# Patient Record
Sex: Male | Born: 1955 | Race: White | Hispanic: No | Marital: Married | Smoking: Never smoker
Health system: Southern US, Community
[De-identification: ages and names within clinical notes are randomized; demographics above are authoritative.]

## PROBLEM LIST (undated history)

## (undated) DIAGNOSIS — E039 Hypothyroidism, unspecified: Secondary | ICD-10-CM | POA: Insufficient documentation

## (undated) DIAGNOSIS — I2699 Other pulmonary embolism without acute cor pulmonale: Secondary | ICD-10-CM

## (undated) DIAGNOSIS — Z86718 Personal history of other venous thrombosis and embolism: Secondary | ICD-10-CM | POA: Insufficient documentation

## (undated) DIAGNOSIS — IMO0001 Reserved for inherently not codable concepts without codable children: Secondary | ICD-10-CM

## (undated) DIAGNOSIS — M1990 Unspecified osteoarthritis, unspecified site: Secondary | ICD-10-CM

## (undated) DIAGNOSIS — M2540 Effusion, unspecified joint: Secondary | ICD-10-CM

## (undated) DIAGNOSIS — C801 Malignant (primary) neoplasm, unspecified: Secondary | ICD-10-CM

## (undated) DIAGNOSIS — M272 Inflammatory conditions of jaws: Secondary | ICD-10-CM

## (undated) DIAGNOSIS — Z8619 Personal history of other infectious and parasitic diseases: Secondary | ICD-10-CM

## (undated) DIAGNOSIS — M7542 Impingement syndrome of left shoulder: Secondary | ICD-10-CM | POA: Insufficient documentation

## (undated) DIAGNOSIS — E119 Type 2 diabetes mellitus without complications: Secondary | ICD-10-CM | POA: Insufficient documentation

## (undated) DIAGNOSIS — Z79899 Other long term (current) drug therapy: Secondary | ICD-10-CM | POA: Insufficient documentation

## (undated) DIAGNOSIS — S43432A Superior glenoid labrum lesion of left shoulder, initial encounter: Secondary | ICD-10-CM | POA: Insufficient documentation

## (undated) DIAGNOSIS — Z923 Personal history of irradiation: Secondary | ICD-10-CM

## (undated) DIAGNOSIS — E785 Hyperlipidemia, unspecified: Secondary | ICD-10-CM | POA: Insufficient documentation

## (undated) DIAGNOSIS — J189 Pneumonia, unspecified organism: Secondary | ICD-10-CM

## (undated) DIAGNOSIS — M2550 Pain in unspecified joint: Secondary | ICD-10-CM

## (undated) DIAGNOSIS — M75102 Unspecified rotator cuff tear or rupture of left shoulder, not specified as traumatic: Secondary | ICD-10-CM | POA: Insufficient documentation

## (undated) DIAGNOSIS — Y842 Radiological procedure and radiotherapy as the cause of abnormal reaction of the patient, or of later complication, without mention of misadventure at the time of the procedure: Secondary | ICD-10-CM

## (undated) DIAGNOSIS — N2889 Other specified disorders of kidney and ureter: Secondary | ICD-10-CM

## (undated) DIAGNOSIS — G629 Polyneuropathy, unspecified: Secondary | ICD-10-CM | POA: Insufficient documentation

## (undated) DIAGNOSIS — M19012 Primary osteoarthritis, left shoulder: Secondary | ICD-10-CM | POA: Insufficient documentation

## (undated) DIAGNOSIS — Z9221 Personal history of antineoplastic chemotherapy: Secondary | ICD-10-CM

## (undated) DIAGNOSIS — M719 Bursopathy, unspecified: Secondary | ICD-10-CM

## (undated) DIAGNOSIS — Z833 Family history of diabetes mellitus: Secondary | ICD-10-CM | POA: Insufficient documentation

## (undated) DIAGNOSIS — I82409 Acute embolism and thrombosis of unspecified deep veins of unspecified lower extremity: Secondary | ICD-10-CM

## (undated) DIAGNOSIS — I10 Essential (primary) hypertension: Secondary | ICD-10-CM

## (undated) HISTORY — DX: Pneumonia, unspecified organism: J18.9

## (undated) HISTORY — PX: MOUTH SURGERY: SHX715

## (undated) HISTORY — PX: JOINT REPLACEMENT: SHX530

## (undated) HISTORY — PX: OTHER SURGICAL HISTORY: SHX169

## (undated) HISTORY — DX: Personal history of irradiation: Z92.3

## (undated) HISTORY — DX: Type 2 diabetes mellitus without complications: E11.9

## (undated) HISTORY — DX: Acute embolism and thrombosis of unspecified deep veins of unspecified lower extremity: I82.409

## (undated) HISTORY — DX: Personal history of antineoplastic chemotherapy: Z92.21

## (undated) HISTORY — DX: Inflammatory conditions of jaws: M27.2

## (undated) HISTORY — DX: Radiological procedure and radiotherapy as the cause of abnormal reaction of the patient, or of later complication, without mention of misadventure at the time of the procedure: Y84.2

## (undated) HISTORY — PX: CARDIAC CATHETERIZATION: SHX172

---
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
## 1991-09-05 HISTORY — PX: KNEE SURGERY: SHX244

## 2001-09-03 ENCOUNTER — Encounter: Admission: RE | Admit: 2001-09-03 | Discharge: 2001-12-02 | Payer: Self-pay | Admitting: Obstetrics and Gynecology

## 2002-04-05 ENCOUNTER — Inpatient Hospital Stay (HOSPITAL_COMMUNITY): Admission: EM | Admit: 2002-04-05 | Discharge: 2002-04-06 | Payer: Self-pay

## 2002-09-04 HISTORY — PX: OTHER SURGICAL HISTORY: SHX169

## 2002-10-25 ENCOUNTER — Inpatient Hospital Stay (HOSPITAL_COMMUNITY): Admission: EM | Admit: 2002-10-25 | Discharge: 2002-10-28 | Payer: Self-pay | Admitting: *Deleted

## 2002-10-25 ENCOUNTER — Encounter: Payer: Self-pay | Admitting: Emergency Medicine

## 2002-10-25 ENCOUNTER — Encounter: Payer: Self-pay | Admitting: Orthopedic Surgery

## 2005-02-27 ENCOUNTER — Ambulatory Visit (HOSPITAL_COMMUNITY): Admission: RE | Admit: 2005-02-27 | Discharge: 2005-02-27 | Payer: Self-pay | Admitting: Gastroenterology

## 2007-09-05 DIAGNOSIS — I82409 Acute embolism and thrombosis of unspecified deep veins of unspecified lower extremity: Secondary | ICD-10-CM

## 2007-09-05 HISTORY — DX: Acute embolism and thrombosis of unspecified deep veins of unspecified lower extremity: I82.409

## 2007-10-06 DIAGNOSIS — C801 Malignant (primary) neoplasm, unspecified: Secondary | ICD-10-CM

## 2007-10-06 HISTORY — DX: Malignant (primary) neoplasm, unspecified: C80.1

## 2007-10-18 ENCOUNTER — Ambulatory Visit (HOSPITAL_BASED_OUTPATIENT_CLINIC_OR_DEPARTMENT_OTHER): Admission: RE | Admit: 2007-10-18 | Discharge: 2007-10-18 | Payer: Self-pay | Admitting: Otolaryngology

## 2007-10-18 ENCOUNTER — Encounter (INDEPENDENT_AMBULATORY_CARE_PROVIDER_SITE_OTHER): Payer: Self-pay | Admitting: Otolaryngology

## 2007-10-25 ENCOUNTER — Ambulatory Visit: Admission: RE | Admit: 2007-10-25 | Discharge: 2007-11-19 | Payer: Self-pay | Admitting: Radiation Oncology

## 2007-10-30 ENCOUNTER — Ambulatory Visit: Payer: Self-pay | Admitting: Dentistry

## 2007-10-30 ENCOUNTER — Ambulatory Visit: Payer: Self-pay | Admitting: Oncology

## 2007-10-30 ENCOUNTER — Encounter: Admission: AD | Admit: 2007-10-30 | Discharge: 2007-10-30 | Payer: Self-pay | Admitting: Dentistry

## 2007-10-31 LAB — COMPREHENSIVE METABOLIC PANEL
AST: 26 U/L (ref 0–37)
Alkaline Phosphatase: 100 U/L (ref 39–117)
BUN: 16 mg/dL (ref 6–23)
Creatinine, Ser: 0.9 mg/dL (ref 0.40–1.50)
Total Bilirubin: 0.7 mg/dL (ref 0.3–1.2)

## 2007-10-31 LAB — CBC WITH DIFFERENTIAL/PLATELET
BASO%: 0.1 % (ref 0.0–2.0)
Basophils Absolute: 0 10*3/uL (ref 0.0–0.1)
EOS%: 0.7 % (ref 0.0–7.0)
HCT: 43.4 % (ref 38.7–49.9)
HGB: 14.8 g/dL (ref 13.0–17.1)
MCH: 28.8 pg (ref 28.0–33.4)
MCHC: 34 g/dL (ref 32.0–35.9)
MCV: 84.6 fL (ref 81.6–98.0)
MONO%: 6.4 % (ref 0.0–13.0)
NEUT%: 73.4 % (ref 40.0–75.0)

## 2007-11-04 ENCOUNTER — Ambulatory Visit: Payer: Self-pay | Admitting: Dentistry

## 2007-11-06 ENCOUNTER — Ambulatory Visit (HOSPITAL_COMMUNITY): Admission: RE | Admit: 2007-11-06 | Discharge: 2007-11-06 | Payer: Self-pay | Admitting: Oncology

## 2007-11-12 ENCOUNTER — Ambulatory Visit (HOSPITAL_COMMUNITY): Admission: RE | Admit: 2007-11-12 | Discharge: 2007-11-12 | Payer: Self-pay | Admitting: Oncology

## 2007-11-21 LAB — CBC WITH DIFFERENTIAL/PLATELET
Basophils Absolute: 0 10*3/uL (ref 0.0–0.1)
EOS%: 0.9 % (ref 0.0–7.0)
HCT: 39.2 % (ref 38.7–49.9)
HGB: 13.4 g/dL (ref 13.0–17.1)
LYMPH%: 23 % (ref 14.0–48.0)
MCH: 28.5 pg (ref 28.0–33.4)
MCHC: 34.2 g/dL (ref 32.0–35.9)
MONO#: 0.7 10*3/uL (ref 0.1–0.9)
NEUT%: 63.4 % (ref 40.0–75.0)
Platelets: 219 10*3/uL (ref 145–400)
lymph#: 1.3 10*3/uL (ref 0.9–3.3)

## 2007-11-26 ENCOUNTER — Encounter: Admission: RE | Admit: 2007-11-26 | Discharge: 2007-11-26 | Payer: Self-pay | Admitting: Interventional Radiology

## 2007-12-11 ENCOUNTER — Ambulatory Visit: Payer: Self-pay | Admitting: Oncology

## 2007-12-11 LAB — CBC WITH DIFFERENTIAL/PLATELET
BASO%: 0.7 % (ref 0.0–2.0)
Basophils Absolute: 0 10*3/uL (ref 0.0–0.1)
EOS%: 1.2 % (ref 0.0–7.0)
Eosinophils Absolute: 0.1 10*3/uL (ref 0.0–0.5)
HCT: 38.4 % — ABNORMAL LOW (ref 38.7–49.9)
HGB: 13.4 g/dL (ref 13.0–17.1)
LYMPH%: 21.7 % (ref 14.0–48.0)
MCH: 29.5 pg (ref 28.0–33.4)
MCHC: 34.8 g/dL (ref 32.0–35.9)
MCV: 84.7 fL (ref 81.6–98.0)
MONO#: 0.6 10*3/uL (ref 0.1–0.9)
MONO%: 12.5 % (ref 0.0–13.0)
NEUT#: 3.3 10*3/uL (ref 1.5–6.5)
NEUT%: 63.9 % (ref 40.0–75.0)
Platelets: 165 10*3/uL (ref 145–400)
RBC: 4.54 10*6/uL (ref 4.20–5.71)
RDW: 14.1 % (ref 11.2–14.6)
WBC: 5.2 10*3/uL (ref 4.0–10.0)
lymph#: 1.1 10*3/uL (ref 0.9–3.3)

## 2007-12-18 LAB — CBC WITH DIFFERENTIAL/PLATELET
BASO%: 0.1 % (ref 0.0–2.0)
Eosinophils Absolute: 0 10*3/uL (ref 0.0–0.5)
LYMPH%: 18.9 % (ref 14.0–48.0)
MCHC: 34.3 g/dL (ref 32.0–35.9)
MCV: 86.6 fL (ref 81.6–98.0)
MONO%: 6 % (ref 0.0–13.0)
NEUT#: 5.7 10*3/uL (ref 1.5–6.5)
Platelets: 129 10*3/uL — ABNORMAL LOW (ref 145–400)
RBC: 4.09 10*6/uL — ABNORMAL LOW (ref 4.20–5.71)
RDW: 14.9 % — ABNORMAL HIGH (ref 11.2–14.6)
WBC: 7.6 10*3/uL (ref 4.0–10.0)

## 2007-12-23 ENCOUNTER — Ambulatory Visit: Admission: RE | Admit: 2007-12-23 | Discharge: 2008-03-22 | Payer: Self-pay | Admitting: Radiation Oncology

## 2007-12-24 LAB — CBC WITH DIFFERENTIAL/PLATELET
BASO%: 1.4 % (ref 0.0–2.0)
Eosinophils Absolute: 0.1 10*3/uL (ref 0.0–0.5)
MCHC: 34.3 g/dL (ref 32.0–35.9)
MONO#: 0.5 10*3/uL (ref 0.1–0.9)
NEUT#: 3.8 10*3/uL (ref 1.5–6.5)
Platelets: 188 10*3/uL (ref 145–400)
RBC: 4.15 10*6/uL — ABNORMAL LOW (ref 4.20–5.71)
RDW: 16.4 % — ABNORMAL HIGH (ref 11.2–14.6)
WBC: 5.7 10*3/uL (ref 4.0–10.0)
lymph#: 1.3 10*3/uL (ref 0.9–3.3)

## 2008-01-01 LAB — CBC WITH DIFFERENTIAL/PLATELET
BASO%: 0.4 % (ref 0.0–2.0)
Eosinophils Absolute: 0.1 10*3/uL (ref 0.0–0.5)
HCT: 35.6 % — ABNORMAL LOW (ref 38.7–49.9)
HGB: 12.4 g/dL — ABNORMAL LOW (ref 13.0–17.1)
LYMPH%: 32.2 % (ref 14.0–48.0)
MCHC: 34.7 g/dL (ref 32.0–35.9)
MONO#: 0.6 10*3/uL (ref 0.1–0.9)
NEUT#: 1.7 10*3/uL (ref 1.5–6.5)
NEUT%: 48.1 % (ref 40.0–75.0)
Platelets: 134 10*3/uL — ABNORMAL LOW (ref 145–400)
WBC: 3.6 10*3/uL — ABNORMAL LOW (ref 4.0–10.0)
lymph#: 1.2 10*3/uL (ref 0.9–3.3)

## 2008-01-08 ENCOUNTER — Ambulatory Visit (HOSPITAL_COMMUNITY): Admission: RE | Admit: 2008-01-08 | Discharge: 2008-01-08 | Payer: Self-pay | Admitting: Oncology

## 2008-01-08 LAB — CBC WITH DIFFERENTIAL/PLATELET
Eosinophils Absolute: 0 10*3/uL (ref 0.0–0.5)
HCT: 31.7 % — ABNORMAL LOW (ref 38.7–49.9)
LYMPH%: 19.1 % (ref 14.0–48.0)
MONO#: 0.4 10*3/uL (ref 0.1–0.9)
NEUT#: 4.9 10*3/uL (ref 1.5–6.5)
NEUT%: 75.2 % — ABNORMAL HIGH (ref 40.0–75.0)
Platelets: 131 10*3/uL — ABNORMAL LOW (ref 145–400)
WBC: 6.5 10*3/uL (ref 4.0–10.0)

## 2008-01-08 LAB — COMPREHENSIVE METABOLIC PANEL
BUN: 15 mg/dL (ref 6–23)
CO2: 25 mEq/L (ref 19–32)
Creatinine, Ser: 0.97 mg/dL (ref 0.40–1.50)
Glucose, Bld: 152 mg/dL — ABNORMAL HIGH (ref 70–99)
Total Bilirubin: 0.3 mg/dL (ref 0.3–1.2)

## 2008-01-08 LAB — LACTATE DEHYDROGENASE: LDH: 184 U/L (ref 94–250)

## 2008-01-14 LAB — COMPREHENSIVE METABOLIC PANEL
ALT: 15 U/L (ref 0–53)
AST: 13 U/L (ref 0–37)
Albumin: 4.1 g/dL (ref 3.5–5.2)
Alkaline Phosphatase: 72 U/L (ref 39–117)
BUN: 21 mg/dL (ref 6–23)
CO2: 25 mEq/L (ref 19–32)
Calcium: 8.9 mg/dL (ref 8.4–10.5)
Chloride: 106 mEq/L (ref 96–112)
Creatinine, Ser: 1.06 mg/dL (ref 0.40–1.50)
Glucose, Bld: 131 mg/dL — ABNORMAL HIGH (ref 70–99)
Potassium: 4.9 mEq/L (ref 3.5–5.3)
Sodium: 141 mEq/L (ref 135–145)
Total Bilirubin: 0.5 mg/dL (ref 0.3–1.2)
Total Protein: 6.3 g/dL (ref 6.0–8.3)

## 2008-01-14 LAB — LACTATE DEHYDROGENASE: LDH: 202 U/L (ref 94–250)

## 2008-01-14 LAB — CBC WITH DIFFERENTIAL/PLATELET
BASO%: 0.4 % (ref 0.0–2.0)
Basophils Absolute: 0 10*3/uL (ref 0.0–0.1)
EOS%: 0.4 % (ref 0.0–7.0)
Eosinophils Absolute: 0 10*3/uL (ref 0.0–0.5)
HCT: 32.8 % — ABNORMAL LOW (ref 38.7–49.9)
HGB: 11.2 g/dL — ABNORMAL LOW (ref 13.0–17.1)
LYMPH%: 20.8 % (ref 14.0–48.0)
MCH: 30.4 pg (ref 28.0–33.4)
MCHC: 34.2 g/dL (ref 32.0–35.9)
MCV: 88.8 fL (ref 81.6–98.0)
MONO#: 0.6 10*3/uL (ref 0.1–0.9)
MONO%: 10.8 % (ref 0.0–13.0)
NEUT#: 3.6 10*3/uL (ref 1.5–6.5)
NEUT%: 67.6 % (ref 40.0–75.0)
Platelets: 165 10*3/uL (ref 145–400)
RBC: 3.69 10*6/uL — ABNORMAL LOW (ref 4.20–5.71)
RDW: 18.8 % — ABNORMAL HIGH (ref 11.2–14.6)
WBC: 5.3 10*3/uL (ref 4.0–10.0)
lymph#: 1.1 10*3/uL (ref 0.9–3.3)

## 2008-01-28 ENCOUNTER — Ambulatory Visit: Payer: Self-pay | Admitting: Oncology

## 2008-01-30 LAB — COMPREHENSIVE METABOLIC PANEL
ALT: 16 U/L (ref 0–53)
AST: 17 U/L (ref 0–37)
Albumin: 4.2 g/dL (ref 3.5–5.2)
Alkaline Phosphatase: 105 U/L (ref 39–117)
Calcium: 9.3 mg/dL (ref 8.4–10.5)
Chloride: 101 mEq/L (ref 96–112)
Potassium: 4.9 mEq/L (ref 3.5–5.3)
Sodium: 140 mEq/L (ref 135–145)

## 2008-01-30 LAB — CBC WITH DIFFERENTIAL/PLATELET
BASO%: 0.3 % (ref 0.0–2.0)
EOS%: 0.2 % (ref 0.0–7.0)
HGB: 10.9 g/dL — ABNORMAL LOW (ref 13.0–17.1)
MCH: 30.9 pg (ref 28.0–33.4)
MCHC: 34.1 g/dL (ref 32.0–35.9)
RBC: 3.52 10*6/uL — ABNORMAL LOW (ref 4.20–5.71)
RDW: 18.1 % — ABNORMAL HIGH (ref 11.2–14.6)
lymph#: 1 10*3/uL (ref 0.9–3.3)

## 2008-02-04 LAB — CBC WITH DIFFERENTIAL/PLATELET
BASO%: 0.9 % (ref 0.0–2.0)
EOS%: 0.7 % (ref 0.0–7.0)
MCH: 31.3 pg (ref 28.0–33.4)
MCHC: 34.7 g/dL (ref 32.0–35.9)
MONO#: 0.7 10*3/uL (ref 0.1–0.9)
NEUT%: 71.5 % (ref 40.0–75.0)
RBC: 3.64 10*6/uL — ABNORMAL LOW (ref 4.20–5.71)
RDW: 15.7 % — ABNORMAL HIGH (ref 11.2–14.6)
WBC: 6.1 10*3/uL (ref 4.0–10.0)
lymph#: 1 10*3/uL (ref 0.9–3.3)

## 2008-02-11 LAB — CBC WITH DIFFERENTIAL/PLATELET
Eosinophils Absolute: 0.1 10*3/uL (ref 0.0–0.5)
MCV: 89.7 fL (ref 81.6–98.0)
MONO%: 9.3 % (ref 0.0–13.0)
NEUT#: 6 10*3/uL (ref 1.5–6.5)
RBC: 3.81 10*6/uL — ABNORMAL LOW (ref 4.20–5.71)
RDW: 14.5 % (ref 11.2–14.6)
WBC: 7.5 10*3/uL (ref 4.0–10.0)

## 2008-02-12 ENCOUNTER — Ambulatory Visit: Payer: Self-pay | Admitting: Dentistry

## 2008-02-18 LAB — COMPREHENSIVE METABOLIC PANEL
AST: 14 U/L (ref 0–37)
Albumin: 4.6 g/dL (ref 3.5–5.2)
Alkaline Phosphatase: 109 U/L (ref 39–117)
Glucose, Bld: 335 mg/dL — ABNORMAL HIGH (ref 70–99)
Potassium: 4.7 mEq/L (ref 3.5–5.3)
Sodium: 132 mEq/L — ABNORMAL LOW (ref 135–145)
Total Protein: 7.5 g/dL (ref 6.0–8.3)

## 2008-02-18 LAB — CBC WITH DIFFERENTIAL/PLATELET
BASO%: 0.6 % (ref 0.0–2.0)
LYMPH%: 6.9 % — ABNORMAL LOW (ref 14.0–48.0)
MCH: 32.2 pg (ref 28.0–33.4)
MCHC: 36 g/dL — ABNORMAL HIGH (ref 32.0–35.9)
MCV: 89.3 fL (ref 81.6–98.0)
MONO%: 9 % (ref 0.0–13.0)
Platelets: 190 10*3/uL (ref 145–400)
RBC: 3.86 10*6/uL — ABNORMAL LOW (ref 4.20–5.71)

## 2008-02-24 LAB — CBC WITH DIFFERENTIAL/PLATELET
BASO%: 1 % (ref 0.0–2.0)
EOS%: 0.3 % (ref 0.0–7.0)
LYMPH%: 5.3 % — ABNORMAL LOW (ref 14.0–48.0)
MCHC: 35.6 g/dL (ref 32.0–35.9)
MONO#: 0.6 10*3/uL (ref 0.1–0.9)
RBC: 3.81 10*6/uL — ABNORMAL LOW (ref 4.20–5.71)
WBC: 8.3 10*3/uL (ref 4.0–10.0)
lymph#: 0.4 10*3/uL — ABNORMAL LOW (ref 0.9–3.3)

## 2008-02-24 LAB — BASIC METABOLIC PANEL
CO2: 21 mEq/L (ref 19–32)
Chloride: 96 mEq/L (ref 96–112)
Glucose, Bld: 264 mg/dL — ABNORMAL HIGH (ref 70–99)
Potassium: 5.2 mEq/L (ref 3.5–5.3)
Sodium: 131 mEq/L — ABNORMAL LOW (ref 135–145)

## 2008-02-27 LAB — BASIC METABOLIC PANEL
BUN: 35 mg/dL — ABNORMAL HIGH (ref 6–23)
CO2: 24 mEq/L (ref 19–32)
Chloride: 102 mEq/L (ref 96–112)
Creatinine, Ser: 1.33 mg/dL (ref 0.40–1.50)
Glucose, Bld: 204 mg/dL — ABNORMAL HIGH (ref 70–99)

## 2008-02-28 ENCOUNTER — Ambulatory Visit (HOSPITAL_COMMUNITY): Admission: RE | Admit: 2008-02-28 | Discharge: 2008-02-28 | Payer: Self-pay | Admitting: Radiation Oncology

## 2008-03-03 LAB — CBC WITH DIFFERENTIAL/PLATELET
EOS%: 1.7 % (ref 0.0–7.0)
Eosinophils Absolute: 0.1 10*3/uL (ref 0.0–0.5)
LYMPH%: 8.1 % — ABNORMAL LOW (ref 14.0–48.0)
MCH: 32.9 pg (ref 28.0–33.4)
MCHC: 35.2 g/dL (ref 32.0–35.9)
MCV: 93.4 fL (ref 81.6–98.0)
MONO%: 13.8 % — ABNORMAL HIGH (ref 0.0–13.0)
NEUT#: 2.3 10*3/uL (ref 1.5–6.5)
Platelets: 109 10*3/uL — ABNORMAL LOW (ref 145–400)
RBC: 3.31 10*6/uL — ABNORMAL LOW (ref 4.20–5.71)

## 2008-03-03 LAB — COMPREHENSIVE METABOLIC PANEL
AST: 15 U/L (ref 0–37)
Alkaline Phosphatase: 96 U/L (ref 39–117)
Glucose, Bld: 269 mg/dL — ABNORMAL HIGH (ref 70–99)
Sodium: 134 mEq/L — ABNORMAL LOW (ref 135–145)
Total Bilirubin: 0.6 mg/dL (ref 0.3–1.2)
Total Protein: 6.8 g/dL (ref 6.0–8.3)

## 2008-03-10 ENCOUNTER — Ambulatory Visit: Payer: Self-pay | Admitting: Oncology

## 2008-03-10 LAB — CBC WITH DIFFERENTIAL/PLATELET
BASO%: 1.9 % (ref 0.0–2.0)
Basophils Absolute: 0 10*3/uL (ref 0.0–0.1)
EOS%: 1.1 % (ref 0.0–7.0)
HCT: 27.9 % — ABNORMAL LOW (ref 38.7–49.9)
HGB: 9.8 g/dL — ABNORMAL LOW (ref 13.0–17.1)
LYMPH%: 14.8 % (ref 14.0–48.0)
MCH: 32.4 pg (ref 28.0–33.4)
MCHC: 35.1 g/dL (ref 32.0–35.9)
MCV: 92.2 fL (ref 81.6–98.0)
MONO%: 17.2 % — ABNORMAL HIGH (ref 0.0–13.0)
NEUT%: 65 % (ref 40.0–75.0)
Platelets: 198 10*3/uL (ref 145–400)
lymph#: 0.3 10*3/uL — ABNORMAL LOW (ref 0.9–3.3)

## 2008-03-16 LAB — CBC WITH DIFFERENTIAL/PLATELET
BASO%: 0 % (ref 0.0–2.0)
Basophils Absolute: 0 10*3/uL (ref 0.0–0.1)
EOS%: 0.8 % (ref 0.0–7.0)
HGB: 9.4 g/dL — ABNORMAL LOW (ref 13.0–17.1)
MCH: 32.9 pg (ref 28.0–33.4)
MCHC: 34.9 g/dL (ref 32.0–35.9)
MCV: 94.3 fL (ref 81.6–98.0)
MONO%: 22.7 % — ABNORMAL HIGH (ref 0.0–13.0)
RDW: 14.8 % — ABNORMAL HIGH (ref 11.2–14.6)

## 2008-03-16 LAB — TECHNOLOGIST REVIEW

## 2008-03-23 LAB — CBC WITH DIFFERENTIAL/PLATELET
BASO%: 1.4 % (ref 0.0–2.0)
Eosinophils Absolute: 0 10*3/uL (ref 0.0–0.5)
MCHC: 33.7 g/dL (ref 32.0–35.9)
MONO#: 0.9 10*3/uL (ref 0.1–0.9)
NEUT#: 4.3 10*3/uL (ref 1.5–6.5)
RBC: 3.09 10*6/uL — ABNORMAL LOW (ref 4.20–5.71)
RDW: 15.3 % — ABNORMAL HIGH (ref 11.2–14.6)
WBC: 5.7 10*3/uL (ref 4.0–10.0)

## 2008-03-27 LAB — COMPREHENSIVE METABOLIC PANEL
ALT: 26 U/L (ref 0–53)
Albumin: 4 g/dL (ref 3.5–5.2)
CO2: 28 mEq/L (ref 19–32)
Calcium: 9.4 mg/dL (ref 8.4–10.5)
Chloride: 96 mEq/L (ref 96–112)
Glucose, Bld: 312 mg/dL — ABNORMAL HIGH (ref 70–99)
Sodium: 137 mEq/L (ref 135–145)
Total Bilirubin: 0.6 mg/dL (ref 0.3–1.2)
Total Protein: 7 g/dL (ref 6.0–8.3)

## 2008-03-27 LAB — CBC WITH DIFFERENTIAL/PLATELET
BASO%: 1.4 % (ref 0.0–2.0)
Eosinophils Absolute: 0 10*3/uL (ref 0.0–0.5)
HCT: 30.3 % — ABNORMAL LOW (ref 38.7–49.9)
LYMPH%: 8.1 % — ABNORMAL LOW (ref 14.0–48.0)
MCHC: 33.6 g/dL (ref 32.0–35.9)
MONO#: 0.5 10*3/uL (ref 0.1–0.9)
NEUT#: 5 10*3/uL (ref 1.5–6.5)
NEUT%: 81.9 % — ABNORMAL HIGH (ref 40.0–75.0)
Platelets: 322 10*3/uL (ref 145–400)
RBC: 3.19 10*6/uL — ABNORMAL LOW (ref 4.20–5.71)
WBC: 6.1 10*3/uL (ref 4.0–10.0)
lymph#: 0.5 10*3/uL — ABNORMAL LOW (ref 0.9–3.3)

## 2008-03-27 LAB — LACTATE DEHYDROGENASE: LDH: 197 U/L (ref 94–250)

## 2008-04-14 ENCOUNTER — Ambulatory Visit: Payer: Self-pay | Admitting: Dentistry

## 2008-05-04 ENCOUNTER — Ambulatory Visit: Payer: Self-pay | Admitting: Oncology

## 2008-05-07 LAB — COMPREHENSIVE METABOLIC PANEL
ALT: 24 U/L (ref 0–53)
CO2: 24 mEq/L (ref 19–32)
Calcium: 9.5 mg/dL (ref 8.4–10.5)
Chloride: 103 mEq/L (ref 96–112)
Creatinine, Ser: 1.19 mg/dL (ref 0.40–1.50)
Glucose, Bld: 240 mg/dL — ABNORMAL HIGH (ref 70–99)
Total Bilirubin: 0.6 mg/dL (ref 0.3–1.2)

## 2008-05-07 LAB — CBC WITH DIFFERENTIAL/PLATELET
BASO%: 0.1 % (ref 0.0–2.0)
Basophils Absolute: 0 10*3/uL (ref 0.0–0.1)
Eosinophils Absolute: 0.1 10*3/uL (ref 0.0–0.5)
HCT: 34.7 % — ABNORMAL LOW (ref 38.7–49.9)
HGB: 11.9 g/dL — ABNORMAL LOW (ref 13.0–17.1)
LYMPH%: 10.9 % — ABNORMAL LOW (ref 14.0–48.0)
MONO#: 0.4 10*3/uL (ref 0.1–0.9)
NEUT#: 3.6 10*3/uL (ref 1.5–6.5)
NEUT%: 79.3 % — ABNORMAL HIGH (ref 40.0–75.0)
Platelets: 239 10*3/uL (ref 145–400)
WBC: 4.5 10*3/uL (ref 4.0–10.0)
lymph#: 0.5 10*3/uL — ABNORMAL LOW (ref 0.9–3.3)

## 2008-05-07 LAB — LACTATE DEHYDROGENASE: LDH: 191 U/L (ref 94–250)

## 2008-05-12 ENCOUNTER — Ambulatory Visit (HOSPITAL_COMMUNITY): Admission: RE | Admit: 2008-05-12 | Discharge: 2008-05-12 | Payer: Self-pay | Admitting: Oncology

## 2008-05-12 ENCOUNTER — Ambulatory Visit: Payer: Self-pay | Admitting: Surgery

## 2008-05-12 ENCOUNTER — Encounter (HOSPITAL_COMMUNITY): Payer: Self-pay | Admitting: Oncology

## 2008-05-13 LAB — CBC WITH DIFFERENTIAL/PLATELET
BASO%: 0.3 % (ref 0.0–2.0)
Basophils Absolute: 0 10*3/uL (ref 0.0–0.1)
EOS%: 1.5 % (ref 0.0–7.0)
HCT: 34.2 % — ABNORMAL LOW (ref 38.7–49.9)
HGB: 11.6 g/dL — ABNORMAL LOW (ref 13.0–17.1)
MCH: 31.3 pg (ref 28.0–33.4)
MCHC: 34 g/dL (ref 32.0–35.9)
MONO#: 0.7 10*3/uL (ref 0.1–0.9)
RDW: 13.5 % (ref 11.2–14.6)
WBC: 6.5 10*3/uL (ref 4.0–10.0)
lymph#: 0.6 10*3/uL — ABNORMAL LOW (ref 0.9–3.3)

## 2008-05-13 LAB — PROTHROMBIN TIME
INR: 1.1 (ref 0.0–1.5)
Prothrombin Time: 14.4 seconds (ref 11.6–15.2)

## 2008-05-16 ENCOUNTER — Ambulatory Visit: Payer: Self-pay | Admitting: Oncology

## 2008-05-16 ENCOUNTER — Ambulatory Visit: Payer: Self-pay | Admitting: Internal Medicine

## 2008-05-16 ENCOUNTER — Inpatient Hospital Stay (HOSPITAL_COMMUNITY): Admission: EM | Admit: 2008-05-16 | Discharge: 2008-05-19 | Payer: Self-pay | Admitting: Family Medicine

## 2008-05-18 LAB — HYPERCOAGULABLE PANEL, COMPREHENSIVE RET.
AntiThromb III Func: 111 % (ref 76–126)
Anticardiolipin IgG: <7 [GPL'U] (ref <11)
Beta-2 Glyco I IgG: <4 U/mL (ref <20)
Beta-2-Glycoprotein I IgA: <4 U/mL (ref <10)
DRVVT: 51.2 secs — ABNORMAL HIGH (ref 36.1–47.0)
Homocysteine: 12.4 umol/L (ref 4.0–15.4)
PTT Lupus Anticoagulant: 45.7 secs (ref 36.3–48.8)
Protein S Activity: 102 % (ref 69–129)
Protein S Ag, Total: 146 % — ABNORMAL HIGH (ref 70–140)

## 2008-05-20 LAB — PROTIME-INR: INR: 2.4 (ref 2.00–3.50)

## 2008-05-21 LAB — PROTIME-INR

## 2008-05-22 LAB — PROTIME-INR: Protime: 27.6 Seconds — ABNORMAL HIGH (ref 10.6–13.4)

## 2008-05-25 LAB — PROTIME-INR
INR: 1.5 — ABNORMAL LOW (ref 2.00–3.50)
Protime: 18 Seconds — ABNORMAL HIGH (ref 10.6–13.4)

## 2008-05-27 LAB — CBC WITH DIFFERENTIAL/PLATELET
BASO%: 0.8 % (ref 0.0–2.0)
Basophils Absolute: 0 10*3/uL (ref 0.0–0.1)
EOS%: 2.5 % (ref 0.0–7.0)
HGB: 10.9 g/dL — ABNORMAL LOW (ref 13.0–17.1)
MCH: 29.6 pg (ref 28.0–33.4)
MCV: 90.4 fL (ref 81.6–98.0)
MONO%: 9.3 % (ref 0.0–13.0)
RBC: 3.7 10*6/uL — ABNORMAL LOW (ref 4.20–5.71)
RDW: 13.2 % (ref 11.2–14.6)
lymph#: 0.6 10*3/uL — ABNORMAL LOW (ref 0.9–3.3)

## 2008-05-27 LAB — PROTIME-INR
INR: 1.7 — ABNORMAL LOW (ref 2.00–3.50)
Protime: 20.4 Seconds — ABNORMAL HIGH (ref 10.6–13.4)

## 2008-05-29 ENCOUNTER — Ambulatory Visit (HOSPITAL_COMMUNITY): Admission: RE | Admit: 2008-05-29 | Discharge: 2008-05-29 | Payer: Self-pay | Admitting: Oncology

## 2008-06-04 LAB — CBC WITH DIFFERENTIAL/PLATELET
BASO%: 0.2 % (ref 0.0–2.0)
Basophils Absolute: 0 10*3/uL (ref 0.0–0.1)
HCT: 33 % — ABNORMAL LOW (ref 38.7–49.9)
HGB: 11.3 g/dL — ABNORMAL LOW (ref 13.0–17.1)
LYMPH%: 14.8 % (ref 14.0–48.0)
MCH: 30.7 pg (ref 28.0–33.4)
MCHC: 34.3 g/dL (ref 32.0–35.9)
MONO#: 0.3 10*3/uL (ref 0.1–0.9)
NEUT%: 74.6 % (ref 40.0–75.0)
Platelets: 310 10*3/uL (ref 145–400)
WBC: 3.6 10*3/uL — ABNORMAL LOW (ref 4.0–10.0)
lymph#: 0.5 10*3/uL — ABNORMAL LOW (ref 0.9–3.3)

## 2008-06-04 LAB — PROTIME-INR

## 2008-06-05 LAB — CBC WITH DIFFERENTIAL/PLATELET
BASO%: 0.2 % (ref 0.0–2.0)
Basophils Absolute: 0 10*3/uL (ref 0.0–0.1)
Eosinophils Absolute: 0.1 10*3/uL (ref 0.0–0.5)
HCT: 33.4 % — ABNORMAL LOW (ref 38.7–49.9)
HGB: 11.4 g/dL — ABNORMAL LOW (ref 13.0–17.1)
LYMPH%: 13.7 % — ABNORMAL LOW (ref 14.0–48.0)
MCHC: 34 g/dL (ref 32.0–35.9)
MONO#: 0.3 10*3/uL (ref 0.1–0.9)
NEUT%: 74.9 % (ref 40.0–75.0)
Platelets: 263 10*3/uL (ref 145–400)
WBC: 3.7 10*3/uL — ABNORMAL LOW (ref 4.0–10.0)
lymph#: 0.5 10*3/uL — ABNORMAL LOW (ref 0.9–3.3)

## 2008-06-05 LAB — COMPREHENSIVE METABOLIC PANEL
ALT: 21 U/L (ref 0–53)
AST: 20 U/L (ref 0–37)
Albumin: 4.3 g/dL (ref 3.5–5.2)
Calcium: 9.5 mg/dL (ref 8.4–10.5)
Chloride: 107 mEq/L (ref 96–112)
Potassium: 3.9 mEq/L (ref 3.5–5.3)

## 2008-06-05 LAB — PROTIME-INR

## 2008-06-08 LAB — PROTIME-INR: INR: 2.7 (ref 2.00–3.50)

## 2008-06-10 LAB — PROTIME-INR: Protime: 31.2 Seconds — ABNORMAL HIGH (ref 10.6–13.4)

## 2008-06-12 LAB — PROTIME-INR

## 2008-06-15 LAB — PROTIME-INR
INR: 3.2 (ref 2.00–3.50)
Protime: 38.4 Seconds — ABNORMAL HIGH (ref 10.6–13.4)

## 2008-06-18 LAB — PROTIME-INR
INR: 2.9 (ref 2.00–3.50)
Protime: 34.8 Seconds — ABNORMAL HIGH (ref 10.6–13.4)

## 2008-06-22 ENCOUNTER — Ambulatory Visit: Payer: Self-pay | Admitting: Oncology

## 2008-06-22 LAB — PROTIME-INR: INR: 3.2 (ref 2.00–3.50)

## 2008-06-26 LAB — PROTIME-INR: INR: 2.2 (ref 2.00–3.50)

## 2008-07-01 LAB — PROTIME-INR: Protime: 15.6 Seconds — ABNORMAL HIGH (ref 10.6–13.4)

## 2008-07-07 LAB — COMPREHENSIVE METABOLIC PANEL
ALT: 14 U/L (ref 0–53)
CO2: 23 mEq/L (ref 19–32)
Calcium: 9.8 mg/dL (ref 8.4–10.5)
Chloride: 108 mEq/L (ref 96–112)
Potassium: 4.7 mEq/L (ref 3.5–5.3)
Sodium: 144 mEq/L (ref 135–145)
Total Bilirubin: 0.4 mg/dL (ref 0.3–1.2)
Total Protein: 7.3 g/dL (ref 6.0–8.3)

## 2008-07-07 LAB — CBC WITH DIFFERENTIAL/PLATELET
Basophils Absolute: 0 10*3/uL (ref 0.0–0.1)
HCT: 33.2 % — ABNORMAL LOW (ref 38.7–49.9)
HGB: 11.2 g/dL — ABNORMAL LOW (ref 13.0–17.1)
MONO#: 0.5 10*3/uL (ref 0.1–0.9)
NEUT%: 73.6 % (ref 40.0–75.0)
WBC: 4.2 10*3/uL (ref 4.0–10.0)
lymph#: 0.6 10*3/uL — ABNORMAL LOW (ref 0.9–3.3)

## 2008-07-07 LAB — LACTATE DEHYDROGENASE: LDH: 179 U/L (ref 94–250)

## 2008-07-07 LAB — PROTIME-INR

## 2008-07-14 LAB — PROTIME-INR
INR: 1.8 — ABNORMAL LOW (ref 2.00–3.50)
Protime: 21.6 Seconds — ABNORMAL HIGH (ref 10.6–13.4)

## 2008-07-21 LAB — PROTIME-INR: INR: 2.1 (ref 2.00–3.50)

## 2008-08-04 LAB — PROTIME-INR: INR: 2.3 (ref 2.00–3.50)

## 2008-08-07 ENCOUNTER — Ambulatory Visit: Payer: Self-pay | Admitting: Oncology

## 2008-08-11 ENCOUNTER — Ambulatory Visit (HOSPITAL_COMMUNITY): Admission: RE | Admit: 2008-08-11 | Discharge: 2008-08-11 | Payer: Self-pay | Admitting: Oncology

## 2008-08-20 LAB — CBC WITH DIFFERENTIAL/PLATELET
Basophils Absolute: 0 10*3/uL (ref 0.0–0.1)
EOS%: 1.8 % (ref 0.0–7.0)
Eosinophils Absolute: 0.1 10*3/uL (ref 0.0–0.5)
HGB: 11.3 g/dL — ABNORMAL LOW (ref 13.0–17.1)
MCH: 30 pg (ref 28.0–33.4)
MONO#: 0.4 10*3/uL (ref 0.1–0.9)
NEUT#: 2.7 10*3/uL (ref 1.5–6.5)
RDW: 14.5 % (ref 11.2–14.6)
WBC: 3.7 10*3/uL — ABNORMAL LOW (ref 4.0–10.0)
lymph#: 0.5 10*3/uL — ABNORMAL LOW (ref 0.9–3.3)

## 2008-08-20 LAB — PROTIME-INR: INR: 2.5 (ref 2.00–3.50)

## 2008-08-21 ENCOUNTER — Ambulatory Visit: Admission: RE | Admit: 2008-08-21 | Discharge: 2008-08-21 | Payer: Self-pay | Admitting: Oncology

## 2008-08-21 ENCOUNTER — Encounter (HOSPITAL_COMMUNITY): Payer: Self-pay | Admitting: Oncology

## 2008-08-21 LAB — COMPREHENSIVE METABOLIC PANEL
ALT: 15 U/L (ref 0–53)
AST: 21 U/L (ref 0–37)
Calcium: 9.1 mg/dL (ref 8.4–10.5)
Chloride: 105 mEq/L (ref 96–112)
Creatinine, Ser: 1.1 mg/dL (ref 0.40–1.50)
Sodium: 141 mEq/L (ref 135–145)

## 2008-09-03 LAB — PROTIME-INR

## 2008-09-04 HISTORY — PX: PORT-A-CATH REMOVAL: SHX5289

## 2008-09-10 LAB — PROTIME-INR: Protime: 21.6 Seconds — ABNORMAL HIGH (ref 10.6–13.4)

## 2008-09-17 LAB — PROTIME-INR: Protime: 24 Seconds — ABNORMAL HIGH (ref 10.6–13.4)

## 2008-09-28 ENCOUNTER — Ambulatory Visit (HOSPITAL_COMMUNITY): Admission: RE | Admit: 2008-09-28 | Discharge: 2008-09-28 | Payer: Self-pay | Admitting: Oncology

## 2008-09-30 ENCOUNTER — Ambulatory Visit: Payer: Self-pay | Admitting: Oncology

## 2008-10-05 LAB — PROTIME-INR

## 2008-10-09 LAB — PROTIME-INR
INR: 1.7 — ABNORMAL LOW (ref 2.00–3.50)
Protime: 20.4 Seconds — ABNORMAL HIGH (ref 10.6–13.4)

## 2008-10-12 LAB — CBC WITH DIFFERENTIAL/PLATELET
Eosinophils Absolute: 0.1 10*3/uL (ref 0.0–0.5)
MONO#: 0.4 10*3/uL (ref 0.1–0.9)
NEUT#: 2.3 10*3/uL (ref 1.5–6.5)
RBC: 4.03 10*6/uL — ABNORMAL LOW (ref 4.20–5.71)
RDW: 13.9 % (ref 11.2–14.6)
WBC: 3.4 10*3/uL — ABNORMAL LOW (ref 4.0–10.0)
lymph#: 0.6 10*3/uL — ABNORMAL LOW (ref 0.9–3.3)

## 2008-10-12 LAB — PROTIME-INR
INR: 2.1 (ref 2.00–3.50)
Protime: 25.2 Seconds — ABNORMAL HIGH (ref 10.6–13.4)

## 2008-10-19 LAB — PROTIME-INR
INR: 2.5 (ref 2.00–3.50)
Protime: 30 Seconds — ABNORMAL HIGH (ref 10.6–13.4)

## 2008-10-22 LAB — PROTIME-INR
INR: 3.7 — ABNORMAL HIGH (ref 2.00–3.50)
Protime: 44.4 Seconds — ABNORMAL HIGH (ref 10.6–13.4)

## 2008-10-26 LAB — PROTIME-INR

## 2008-10-30 LAB — PROTIME-INR

## 2008-11-05 LAB — PROTIME-INR: INR: 2.2 (ref 2.00–3.50)

## 2008-11-12 LAB — PROTIME-INR: INR: 2.3 (ref 2.00–3.50)

## 2008-11-24 ENCOUNTER — Ambulatory Visit: Payer: Self-pay | Admitting: Oncology

## 2008-12-10 LAB — PROTIME-INR
INR: 2.6 (ref 2.00–3.50)
Protime: 31.2 Seconds — ABNORMAL HIGH (ref 10.6–13.4)

## 2008-12-24 LAB — COMPREHENSIVE METABOLIC PANEL
AST: 19 U/L (ref 0–37)
Alkaline Phosphatase: 80 U/L (ref 39–117)
BUN: 26 mg/dL — ABNORMAL HIGH (ref 6–23)
Calcium: 9.1 mg/dL (ref 8.4–10.5)
Creatinine, Ser: 1.23 mg/dL (ref 0.40–1.50)
Total Bilirubin: 0.5 mg/dL (ref 0.3–1.2)

## 2008-12-24 LAB — PROTIME-INR
INR: 1.8 — ABNORMAL LOW (ref 2.00–3.50)
Protime: 21.6 Seconds — ABNORMAL HIGH (ref 10.6–13.4)

## 2008-12-24 LAB — CBC WITH DIFFERENTIAL/PLATELET
BASO%: 0.1 % (ref 0.0–2.0)
EOS%: 1.1 % (ref 0.0–7.0)
MCH: 30 pg (ref 27.2–33.4)
MCV: 87.7 fL (ref 79.3–98.0)
MONO%: 7.1 % (ref 0.0–14.0)
RBC: 4.39 10*6/uL (ref 4.20–5.82)
RDW: 14.1 % (ref 11.0–14.6)
lymph#: 0.5 10*3/uL — ABNORMAL LOW (ref 0.9–3.3)

## 2009-01-07 LAB — PROTIME-INR: Protime: 24 Seconds — ABNORMAL HIGH (ref 10.6–13.4)

## 2009-01-19 ENCOUNTER — Ambulatory Visit: Payer: Self-pay | Admitting: Oncology

## 2009-01-28 LAB — PROTIME-INR
INR: 2.3 (ref 2.00–3.50)
Protime: 27.6 Seconds — ABNORMAL HIGH (ref 10.6–13.4)

## 2009-02-04 LAB — PROTIME-INR

## 2009-02-18 ENCOUNTER — Encounter (HOSPITAL_COMMUNITY): Payer: Self-pay | Admitting: Oncology

## 2009-02-18 ENCOUNTER — Ambulatory Visit: Payer: Self-pay | Admitting: Vascular Surgery

## 2009-02-18 ENCOUNTER — Ambulatory Visit: Admission: RE | Admit: 2009-02-18 | Discharge: 2009-02-18 | Payer: Self-pay | Admitting: Oncology

## 2009-02-18 LAB — COMPREHENSIVE METABOLIC PANEL
ALT: 23 U/L (ref 0–53)
Albumin: 4.4 g/dL (ref 3.5–5.2)
CO2: 25 mEq/L (ref 19–32)
Calcium: 9.4 mg/dL (ref 8.4–10.5)
Chloride: 105 mEq/L (ref 96–112)
Potassium: 4.5 mEq/L (ref 3.5–5.3)
Sodium: 141 mEq/L (ref 135–145)
Total Bilirubin: 0.5 mg/dL (ref 0.3–1.2)
Total Protein: 7.2 g/dL (ref 6.0–8.3)

## 2009-02-18 LAB — CBC WITH DIFFERENTIAL/PLATELET
Basophils Absolute: 0 10*3/uL (ref 0.0–0.1)
Eosinophils Absolute: 0.1 10*3/uL (ref 0.0–0.5)
HCT: 39.9 % (ref 38.4–49.9)
HGB: 13.9 g/dL (ref 13.0–17.1)
LYMPH%: 16.5 % (ref 14.0–49.0)
MONO#: 0.4 10*3/uL (ref 0.1–0.9)
NEUT%: 72.9 % (ref 39.0–75.0)
Platelets: 173 10*3/uL (ref 140–400)
WBC: 4.7 10*3/uL (ref 4.0–10.3)
lymph#: 0.8 10*3/uL — ABNORMAL LOW (ref 0.9–3.3)

## 2009-02-18 LAB — LACTATE DEHYDROGENASE: LDH: 197 U/L (ref 94–250)

## 2009-02-18 LAB — PROTIME-INR

## 2009-03-02 ENCOUNTER — Ambulatory Visit: Payer: Self-pay | Admitting: Oncology

## 2009-03-04 LAB — PROTIME-INR: INR: 2.7 (ref 2.00–3.50)

## 2009-03-18 LAB — PROTIME-INR

## 2009-04-01 ENCOUNTER — Ambulatory Visit: Payer: Self-pay | Admitting: Oncology

## 2009-04-15 LAB — PROTIME-INR: Protime: 21.6 Seconds — ABNORMAL HIGH (ref 10.6–13.4)

## 2009-04-29 LAB — PROTIME-INR: INR: 2.9 (ref 2.00–3.50)

## 2009-05-06 ENCOUNTER — Ambulatory Visit: Payer: Self-pay | Admitting: Oncology

## 2009-05-11 ENCOUNTER — Ambulatory Visit (HOSPITAL_COMMUNITY): Admission: RE | Admit: 2009-05-11 | Discharge: 2009-05-11 | Payer: Self-pay | Admitting: Oncology

## 2009-05-11 LAB — PROTIME-INR: Protime: 37.2 Seconds — ABNORMAL HIGH (ref 10.6–13.4)

## 2009-05-17 LAB — COMPREHENSIVE METABOLIC PANEL
Albumin: 4.5 g/dL (ref 3.5–5.2)
BUN: 27 mg/dL — ABNORMAL HIGH (ref 6–23)
Calcium: 9.1 mg/dL (ref 8.4–10.5)
Chloride: 104 mEq/L (ref 96–112)
Glucose, Bld: 154 mg/dL — ABNORMAL HIGH (ref 70–99)
Potassium: 4.8 mEq/L (ref 3.5–5.3)
Total Protein: 6.9 g/dL (ref 6.0–8.3)

## 2009-05-17 LAB — CBC WITH DIFFERENTIAL/PLATELET
Basophils Absolute: 0 10*3/uL (ref 0.0–0.1)
Eosinophils Absolute: 0 10*3/uL (ref 0.0–0.5)
HCT: 39.1 % (ref 38.4–49.9)
HGB: 13.4 g/dL (ref 13.0–17.1)
LYMPH%: 13.8 % — ABNORMAL LOW (ref 14.0–49.0)
MCV: 88.5 fL (ref 79.3–98.0)
MONO%: 9.5 % (ref 0.0–14.0)
NEUT#: 3 10*3/uL (ref 1.5–6.5)
NEUT%: 75.2 % — ABNORMAL HIGH (ref 39.0–75.0)
Platelets: 163 10*3/uL (ref 140–400)

## 2009-05-20 LAB — CARDIOLIPIN ANTIBODIES, IGG, IGM, IGA: Anticardiolipin IgA: <10 APL U/mL (ref <10)

## 2009-05-20 LAB — LUPUS ANTICOAGULANT PANEL
DRVVT 1:1 Mix: 41 secs (ref 36.1–47.0)
Drvvt confirmation: 1.22 Ratio — ABNORMAL HIGH (ref <1.18)
PTTLA 4:1 Mix: 40.2 secs (ref 36.3–48.8)

## 2009-05-24 LAB — PROTIME-INR
INR: 3.5 (ref 2.00–3.50)
Protime: 42 Seconds — ABNORMAL HIGH (ref 10.6–13.4)

## 2009-05-28 LAB — PROTIME-INR: Protime: 34.8 Seconds — ABNORMAL HIGH (ref 10.6–13.4)

## 2009-06-09 ENCOUNTER — Ambulatory Visit: Payer: Self-pay | Admitting: Oncology

## 2009-06-11 LAB — PROTIME-INR: INR: 3.2 (ref 2.00–3.50)

## 2009-06-25 LAB — PROTIME-INR: Protime: 33.6 Seconds — ABNORMAL HIGH (ref 10.6–13.4)

## 2009-07-09 ENCOUNTER — Ambulatory Visit: Payer: Self-pay | Admitting: Oncology

## 2009-07-19 LAB — CBC WITH DIFFERENTIAL/PLATELET
BASO%: 0.3 % (ref 0.0–2.0)
EOS%: 1.6 % (ref 0.0–7.0)
HCT: 39.4 % (ref 38.4–49.9)
MCH: 30.5 pg (ref 27.2–33.4)
MCHC: 33.8 g/dL (ref 32.0–36.0)
MONO#: 0.3 10*3/uL (ref 0.1–0.9)
RBC: 4.37 10*6/uL (ref 4.20–5.82)
RDW: 13.4 % (ref 11.0–14.6)
WBC: 3.9 10*3/uL — ABNORMAL LOW (ref 4.0–10.3)
lymph#: 0.6 10*3/uL — ABNORMAL LOW (ref 0.9–3.3)

## 2009-07-19 LAB — PROTIME-INR
INR: 2.8 (ref 2.00–3.50)
Protime: 33.6 Seconds — ABNORMAL HIGH (ref 10.6–13.4)

## 2009-07-20 LAB — D-DIMER, QUANTITATIVE: D-Dimer, Quant: 0.46 ug/mL-FEU (ref 0.00–0.48)

## 2009-07-20 LAB — COMPREHENSIVE METABOLIC PANEL
ALT: 24 U/L (ref 0–53)
AST: 20 U/L (ref 0–37)
BUN: 19 mg/dL (ref 6–23)
CO2: 26 mEq/L (ref 19–32)
Calcium: 8.7 mg/dL (ref 8.4–10.5)
Chloride: 103 mEq/L (ref 96–112)
Creatinine, Ser: 1.23 mg/dL (ref 0.40–1.50)
Total Bilirubin: 0.4 mg/dL (ref 0.3–1.2)

## 2009-07-20 LAB — LUPUS ANTICOAGULANT PANEL
DRVVT: 73.7 secs — ABNORMAL HIGH (ref 34.7–40.5)
PTT Lupus Anticoagulant: 58.4 secs — ABNORMAL HIGH (ref 32.0–43.4)
PTTLA Confirmation: 0 secs (ref <8.0)

## 2009-07-20 LAB — BETA-2 GLYCOPROTEIN ANTIBODIES
Beta-2 Glyco I IgG: <3 U/mL (ref <=15)
Beta-2-Glycoprotein I IgM: 4 U/mL (ref <=15)

## 2009-07-20 LAB — LACTATE DEHYDROGENASE: LDH: 183 U/L (ref 94–250)

## 2009-07-20 LAB — CARDIOLIPIN ANTIBODIES, IGG, IGM, IGA: Anticardiolipin IgA: <3 APL U/mL (ref <10)

## 2009-07-23 LAB — HEXAGONAL PHOSPHOLIPID NEUTRALIZATION: Hex Phosph Neut Test: NEGATIVE

## 2009-08-18 ENCOUNTER — Ambulatory Visit: Payer: Self-pay | Admitting: Oncology

## 2009-08-20 LAB — PROTIME-INR
INR: 3.1 (ref 2.00–3.50)
Protime: 37.2 Seconds — ABNORMAL HIGH (ref 10.6–13.4)

## 2009-09-02 LAB — PROTIME-INR
INR: 2.5 (ref 2.00–3.50)
Protime: 30 Seconds — ABNORMAL HIGH (ref 10.6–13.4)

## 2009-10-15 ENCOUNTER — Ambulatory Visit: Payer: Self-pay | Admitting: Oncology

## 2009-10-19 LAB — CBC WITH DIFFERENTIAL/PLATELET
BASO%: 0.3 % (ref 0.0–2.0)
Basophils Absolute: 0 10*3/uL (ref 0.0–0.1)
EOS%: 1.4 % (ref 0.0–7.0)
Eosinophils Absolute: 0.1 10*3/uL (ref 0.0–0.5)
HCT: 40.7 % (ref 38.4–49.9)
HGB: 13.8 g/dL (ref 13.0–17.1)
LYMPH%: 14.3 % (ref 14.0–49.0)
MCH: 30 pg (ref 27.2–33.4)
MCHC: 33.8 g/dL (ref 32.0–36.0)
MCV: 88.8 fL (ref 79.3–98.0)
MONO#: 0.4 10*3/uL (ref 0.1–0.9)
MONO%: 9.4 % (ref 0.0–14.0)
NEUT#: 3.4 10*3/uL (ref 1.5–6.5)
NEUT%: 74.6 % (ref 39.0–75.0)
Platelets: 165 10*3/uL (ref 140–400)
RBC: 4.59 10*6/uL (ref 4.20–5.82)
RDW: 13.6 % (ref 11.0–14.6)
WBC: 4.6 10*3/uL (ref 4.0–10.3)
lymph#: 0.7 10*3/uL — ABNORMAL LOW (ref 0.9–3.3)

## 2009-10-20 LAB — TSH: TSH: 2.492 u[IU]/mL (ref 0.350–4.500)

## 2009-10-20 LAB — COMPREHENSIVE METABOLIC PANEL
ALT: 21 U/L (ref 0–53)
AST: 15 U/L (ref 0–37)
Alkaline Phosphatase: 87 U/L (ref 39–117)
Calcium: 9.4 mg/dL (ref 8.4–10.5)
Glucose, Bld: 238 mg/dL — ABNORMAL HIGH (ref 70–99)

## 2009-10-20 LAB — LUPUS ANTICOAGULANT PANEL: PTT Lupus Anticoagulant: 37.2 secs (ref 32.0–43.4)

## 2009-10-28 ENCOUNTER — Emergency Department (HOSPITAL_COMMUNITY): Admission: EM | Admit: 2009-10-28 | Discharge: 2009-10-28 | Payer: Self-pay | Admitting: Family Medicine

## 2009-11-09 ENCOUNTER — Ambulatory Visit (HOSPITAL_COMMUNITY): Admission: RE | Admit: 2009-11-09 | Discharge: 2009-11-09 | Payer: Self-pay | Admitting: Oncology

## 2010-01-07 ENCOUNTER — Ambulatory Visit: Payer: Self-pay | Admitting: Oncology

## 2010-01-10 LAB — COMPREHENSIVE METABOLIC PANEL
ALT: 16 U/L (ref 0–53)
AST: 15 U/L (ref 0–37)
Albumin: 4.3 g/dL (ref 3.5–5.2)
Alkaline Phosphatase: 90 U/L (ref 39–117)
BUN: 18 mg/dL (ref 6–23)
Creatinine, Ser: 1.15 mg/dL (ref 0.40–1.50)
Glucose, Bld: 256 mg/dL — ABNORMAL HIGH (ref 70–99)
Total Protein: 6.6 g/dL (ref 6.0–8.3)

## 2010-01-10 LAB — LACTATE DEHYDROGENASE: LDH: 162 U/L (ref 94–250)

## 2010-01-10 LAB — CBC WITH DIFFERENTIAL/PLATELET
BASO%: 0.3 % (ref 0.0–2.0)
Basophils Absolute: 0 10*3/uL (ref 0.0–0.1)
EOS%: 1.2 % (ref 0.0–7.0)
HCT: 40.7 % (ref 38.4–49.9)
HGB: 13.7 g/dL (ref 13.0–17.1)
MONO#: 0.4 10*3/uL (ref 0.1–0.9)
MONO%: 9 % (ref 0.0–14.0)
NEUT#: 3 10*3/uL (ref 1.5–6.5)
WBC: 4 10*3/uL (ref 4.0–10.3)

## 2010-03-06 ENCOUNTER — Emergency Department (HOSPITAL_COMMUNITY): Admission: EM | Admit: 2010-03-06 | Discharge: 2010-03-06 | Payer: Self-pay | Admitting: Emergency Medicine

## 2010-04-08 ENCOUNTER — Ambulatory Visit: Payer: Self-pay | Admitting: Oncology

## 2010-04-12 LAB — COMPREHENSIVE METABOLIC PANEL
ALT: 16 U/L (ref 0–53)
AST: 13 U/L (ref 0–37)
Albumin: 4.3 g/dL (ref 3.5–5.2)
Alkaline Phosphatase: 100 U/L (ref 39–117)
Calcium: 9.2 mg/dL (ref 8.4–10.5)
Chloride: 104 mEq/L (ref 96–112)
Total Protein: 6.6 g/dL (ref 6.0–8.3)

## 2010-04-12 LAB — CBC WITH DIFFERENTIAL/PLATELET
BASO%: 0.1 % (ref 0.0–2.0)
Basophils Absolute: 0 10*3/uL (ref 0.0–0.1)
HGB: 13.7 g/dL (ref 13.0–17.1)
LYMPH%: 13.9 % — ABNORMAL LOW (ref 14.0–49.0)
MCH: 29.7 pg (ref 27.2–33.4)
MONO%: 8.3 % (ref 0.0–14.0)
NEUT#: 3.5 10*3/uL (ref 1.5–6.5)
Platelets: 195 10*3/uL (ref 140–400)
RBC: 4.61 10*6/uL (ref 4.20–5.82)

## 2010-04-12 LAB — LACTATE DEHYDROGENASE: LDH: 174 U/L (ref 94–250)

## 2010-04-13 ENCOUNTER — Ambulatory Visit: Payer: Self-pay | Admitting: Vascular Surgery

## 2010-04-13 ENCOUNTER — Encounter (HOSPITAL_COMMUNITY): Payer: Self-pay | Admitting: Oncology

## 2010-04-13 ENCOUNTER — Ambulatory Visit: Admission: RE | Admit: 2010-04-13 | Discharge: 2010-04-13 | Payer: Self-pay | Admitting: Oncology

## 2010-07-13 ENCOUNTER — Ambulatory Visit: Payer: Self-pay | Admitting: Oncology

## 2010-07-15 LAB — COMPREHENSIVE METABOLIC PANEL
ALT: 13 U/L (ref 0–53)
AST: 13 U/L (ref 0–37)
Albumin: 4.2 g/dL (ref 3.5–5.2)
BUN: 21 mg/dL (ref 6–23)
Total Protein: 6.8 g/dL (ref 6.0–8.3)

## 2010-07-15 LAB — CBC WITH DIFFERENTIAL/PLATELET
Eosinophils Absolute: 0.1 10*3/uL (ref 0.0–0.5)
MCH: 30 pg (ref 27.2–33.4)
MCHC: 34.2 g/dL (ref 32.0–36.0)
MCV: 87.5 fL (ref 79.3–98.0)
MONO%: 9.9 % (ref 0.0–14.0)
NEUT#: 2.9 10*3/uL (ref 1.5–6.5)
NEUT%: 73.4 % (ref 39.0–75.0)
Platelets: 170 10*3/uL (ref 140–400)
RBC: 4.28 10*6/uL (ref 4.20–5.82)
RDW: 13.8 % (ref 11.0–14.6)

## 2010-07-15 LAB — LACTATE DEHYDROGENASE: LDH: 148 U/L (ref 94–250)

## 2010-07-15 LAB — TSH: TSH: 2.684 u[IU]/mL (ref 0.350–4.500)

## 2010-09-24 ENCOUNTER — Other Ambulatory Visit (HOSPITAL_COMMUNITY): Payer: Self-pay | Admitting: Oncology

## 2010-09-24 DIAGNOSIS — C3490 Malignant neoplasm of unspecified part of unspecified bronchus or lung: Secondary | ICD-10-CM

## 2010-09-25 ENCOUNTER — Encounter: Payer: Self-pay | Admitting: Radiation Oncology

## 2010-10-06 ENCOUNTER — Ambulatory Visit (HOSPITAL_COMMUNITY): Payer: Self-pay | Admitting: Dentistry

## 2010-10-06 DIAGNOSIS — R220 Localized swelling, mass and lump, head: Secondary | ICD-10-CM

## 2010-10-06 DIAGNOSIS — R221 Localized swelling, mass and lump, neck: Secondary | ICD-10-CM

## 2010-10-06 DIAGNOSIS — K0530 Chronic periodontitis, unspecified: Secondary | ICD-10-CM

## 2010-10-06 DIAGNOSIS — K045 Chronic apical periodontitis: Secondary | ICD-10-CM

## 2010-10-11 ENCOUNTER — Ambulatory Visit (HOSPITAL_COMMUNITY)
Admission: RE | Admit: 2010-10-11 | Discharge: 2010-10-11 | Disposition: A | Discharge status: Home / Self Care | Payer: 59 | Source: Ambulatory Visit | Attending: Oncology | Admitting: Oncology

## 2010-10-11 ENCOUNTER — Encounter (HOSPITAL_COMMUNITY): Payer: Self-pay

## 2010-10-11 ENCOUNTER — Encounter (HOSPITAL_BASED_OUTPATIENT_CLINIC_OR_DEPARTMENT_OTHER): Payer: 59 | Admitting: Oncology

## 2010-10-11 ENCOUNTER — Other Ambulatory Visit (HOSPITAL_COMMUNITY): Payer: Self-pay | Admitting: Oncology

## 2010-10-11 DIAGNOSIS — J984 Other disorders of lung: Secondary | ICD-10-CM | POA: Insufficient documentation

## 2010-10-11 DIAGNOSIS — C01 Malignant neoplasm of base of tongue: Secondary | ICD-10-CM

## 2010-10-11 DIAGNOSIS — C3490 Malignant neoplasm of unspecified part of unspecified bronchus or lung: Secondary | ICD-10-CM | POA: Insufficient documentation

## 2010-10-11 DIAGNOSIS — Z923 Personal history of irradiation: Secondary | ICD-10-CM | POA: Insufficient documentation

## 2010-10-11 DIAGNOSIS — Z9221 Personal history of antineoplastic chemotherapy: Secondary | ICD-10-CM | POA: Insufficient documentation

## 2010-10-11 HISTORY — DX: Malignant (primary) neoplasm, unspecified: C80.1

## 2010-10-11 LAB — CMP (CANCER CENTER ONLY)
AST: 27 U/L (ref 11–38)
BUN, Bld: 21 mg/dL (ref 7–22)
Calcium: 9.1 mg/dL (ref 8.0–10.3)
Chloride: 103 mEq/L (ref 98–108)
Creat: 1.1 mg/dl (ref 0.6–1.2)
Sodium: 144 mEq/L (ref 128–145)
Total Protein: 7.1 g/dL (ref 6.4–8.1)

## 2010-10-11 MED ORDER — IOHEXOL 300 MG/ML  SOLN
125.0000 mL | Freq: Once | INTRAMUSCULAR | Status: AC | PRN
  Administered 2010-10-11: 125 mL via INTRAVENOUS

## 2010-10-23 IMAGING — CT CT NECK W/ CM
4 of 8 series · 14 of 33 positions shown, 16 images · IV contrast (agent unspecified)
Comparison: 05/11/2009

CT NECK

CLINICAL DATA: Follow-up squamous cell carcinoma of base of
tongue.  Status post chemotherapy and radiation therapy.

CT NECK AND CHEST WITH CONTRAST
TECHNIQUE: Multidetector CT imaging of the neck and chest was
performed using the standard protocol after bolus administration of
intravenous contrast.
Contrast:  100 ml Emnipaque-RXX

[Series 2: chest with st · axial · 0.77mm/px · z∈[-336,-256]mm · 2 of 50 slices shown]
[im 17/50  bone]
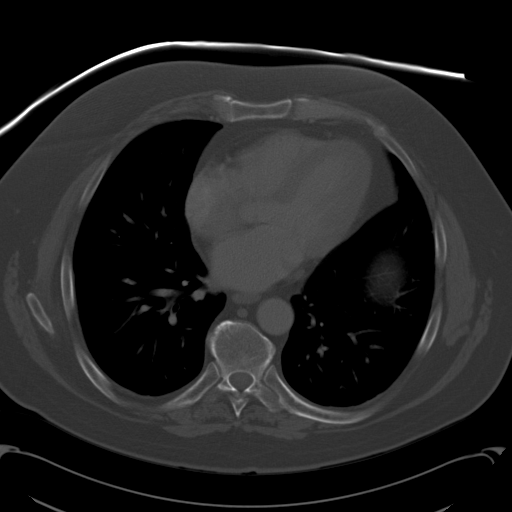
[im 33/50  bone]
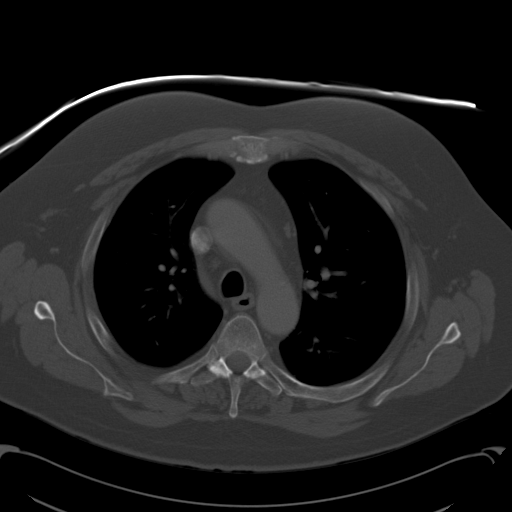

[Series 7: neck st · axial · 0.40mm/px · z∈[-198,-27]mm · 5 of 87 slices shown, 7 images]
[im 15/87  soft-tissue]
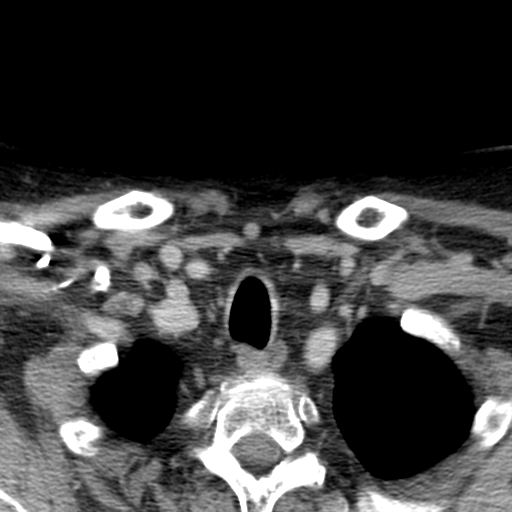
[im 15/87  bone]
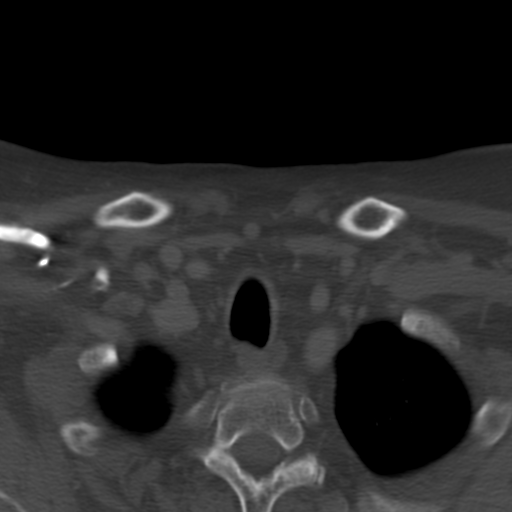
[im 29/87  bone]
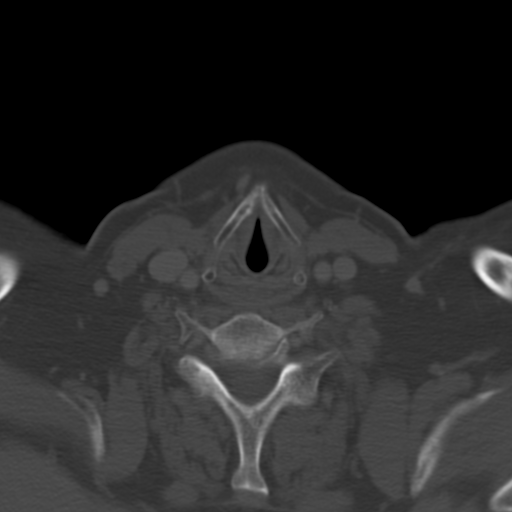
[im 44/87  bone]
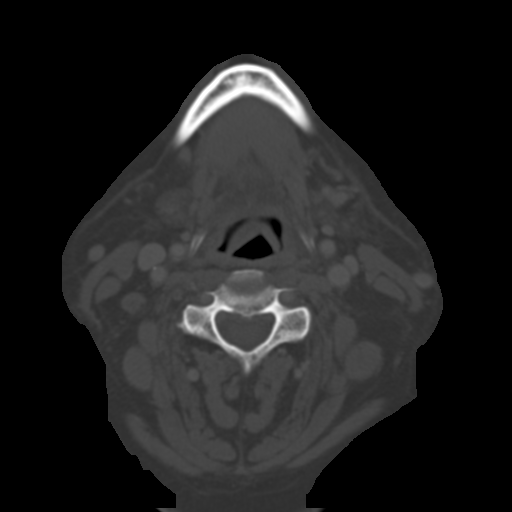
[im 58/87  bone]
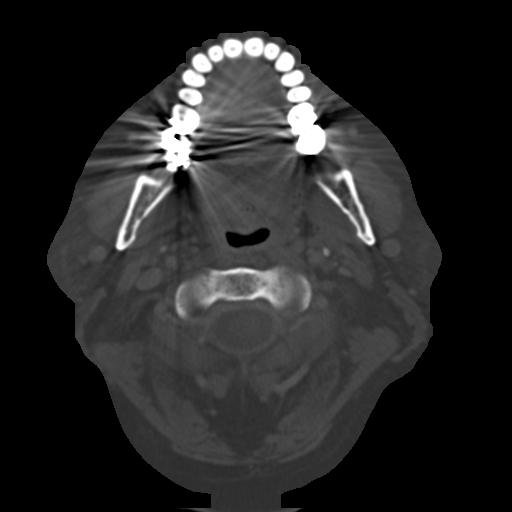
[im 72/87  soft-tissue]
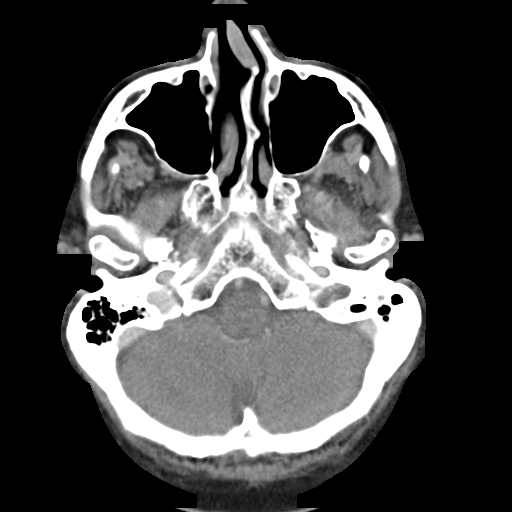
[im 72/87  bone]
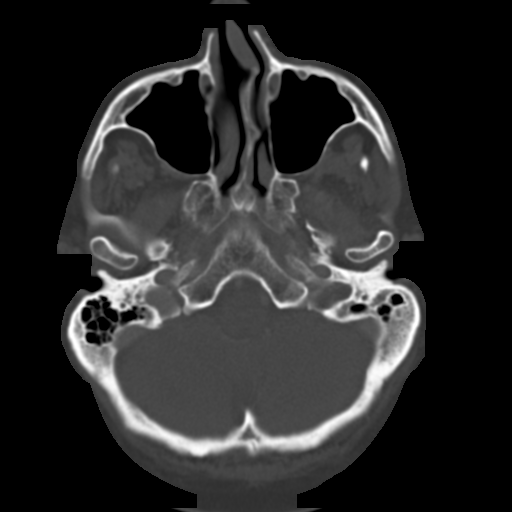

[Series 602: <mpr thick range> · coronal · 0.77mm/px · 2 of 102 slices shown]
[im 34/102  bone]
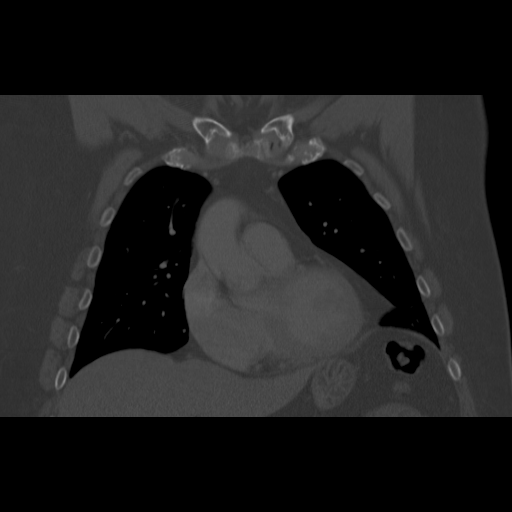
[im 68/102  bone]
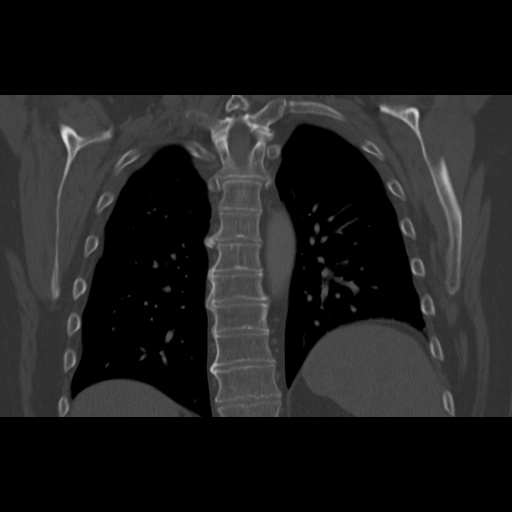

[Series 603: <mpr thick range(1)> · sagittal · 0.77mm/px · 5 of 131 slices shown]
[im 33/131  bone]
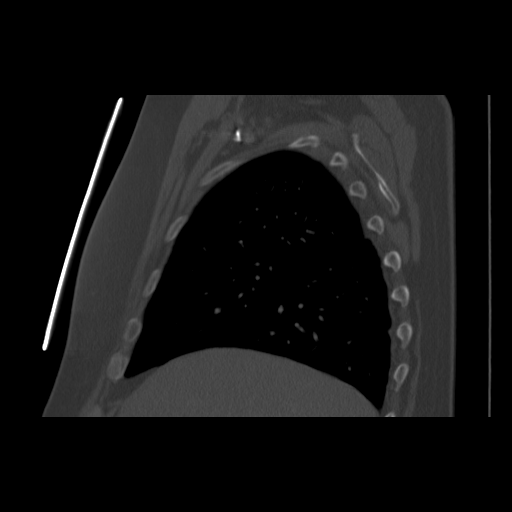
[im 49/131  bone]
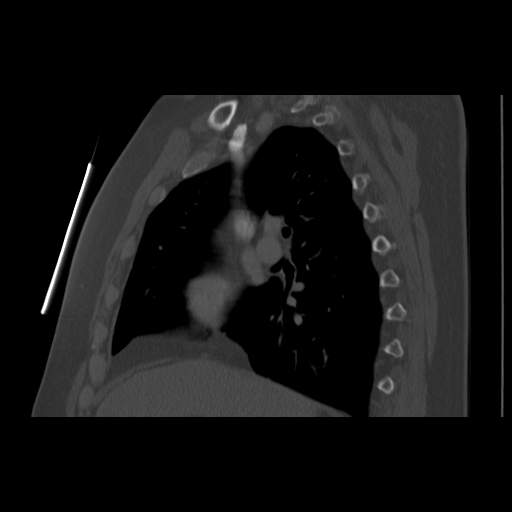
[im 66/131  bone]
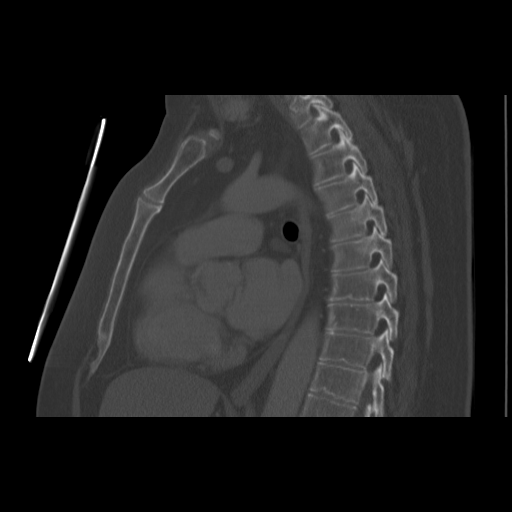
[im 82/131  bone]
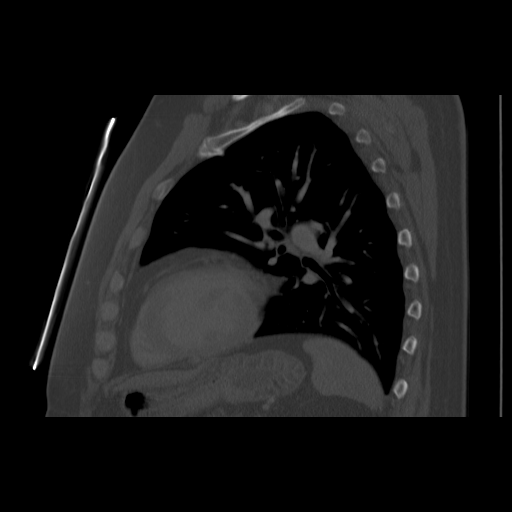
[im 98/131  bone]
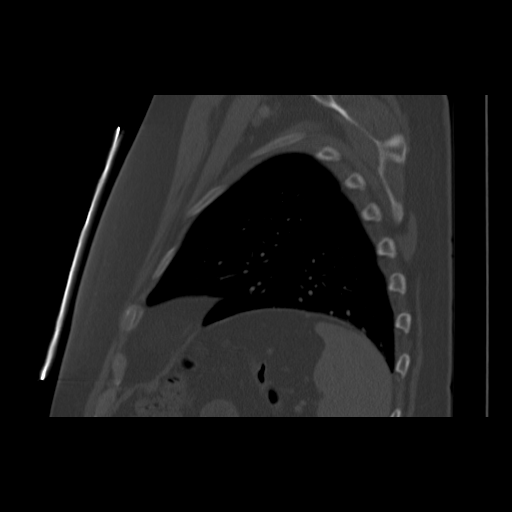

[14 of 33 positions shown; findings below may reference images not displayed]

FINDINGS: No soft tissue masses are identified within the neck.
Shotty subcentimeter bilateral cervical nodes are stable, and there
is no evidence of pathologically enlarged or necrotic lymph nodes.
Pharynx, larynx, and parapharyngeal soft tissue planes remain
unremarkable appearance.  The thyroid glands and salivary glands
appear normal.  Mucous retention cyst again noted in the right
maxillary sinus.
IMPRESSION: Stable neck CT.  No evidence of recurrent or metastatic disease.

CT CHEST
FINDINGS: No evidence of mediastinal or hilar masses.  No
adenopathy seen within the thorax.  No evidence of pleural or
pericardial effusion.

Tiny less than 5 mm left upper lobe nodules are stable.  No new or
enlarging pulmonary nodules or masses are identified.  There is no
evidence of pulmonary infiltrate.  No suspicious bone lesions are
identified.
IMPRESSION: Stable chest CT.  No evidence of metastatic disease or other acute
findings.

## 2010-11-03 DIAGNOSIS — Y842 Radiological procedure and radiotherapy as the cause of abnormal reaction of the patient, or of later complication, without mention of misadventure at the time of the procedure: Secondary | ICD-10-CM

## 2010-11-03 DIAGNOSIS — M272 Inflammatory conditions of jaws: Secondary | ICD-10-CM

## 2010-11-03 HISTORY — DX: Radiological procedure and radiotherapy as the cause of abnormal reaction of the patient, or of later complication, without mention of misadventure at the time of the procedure: Y84.2

## 2010-11-03 HISTORY — DX: Inflammatory conditions of jaws: M27.2

## 2010-11-14 ENCOUNTER — Other Ambulatory Visit (HOSPITAL_COMMUNITY): Payer: Self-pay

## 2010-11-17 ENCOUNTER — Other Ambulatory Visit (HOSPITAL_COMMUNITY): Payer: Self-pay | Admitting: Oncology

## 2010-11-17 ENCOUNTER — Encounter (HOSPITAL_BASED_OUTPATIENT_CLINIC_OR_DEPARTMENT_OTHER): Payer: Medicaid - Dental | Admitting: Oncology

## 2010-11-17 DIAGNOSIS — Z7901 Long term (current) use of anticoagulants: Secondary | ICD-10-CM

## 2010-11-17 DIAGNOSIS — Z86718 Personal history of other venous thrombosis and embolism: Secondary | ICD-10-CM

## 2010-11-17 DIAGNOSIS — C01 Malignant neoplasm of base of tongue: Secondary | ICD-10-CM

## 2010-11-17 LAB — COMPREHENSIVE METABOLIC PANEL
ALT: 16 U/L (ref 0–53)
AST: 19 U/L (ref 0–37)
Albumin: 4.4 g/dL (ref 3.5–5.2)
Alkaline Phosphatase: 77 U/L (ref 39–117)
BUN: 27 mg/dL — ABNORMAL HIGH (ref 6–23)
Potassium: 4.1 mEq/L (ref 3.5–5.3)
Sodium: 139 mEq/L (ref 135–145)

## 2010-11-17 LAB — LACTATE DEHYDROGENASE: LDH: 172 U/L (ref 94–250)

## 2010-11-17 LAB — CBC WITH DIFFERENTIAL/PLATELET
BASO%: 0.2 % (ref 0.0–2.0)
EOS%: 2 % (ref 0.0–7.0)
MCH: 29.8 pg (ref 27.2–33.4)
MCHC: 33.8 g/dL (ref 32.0–36.0)
NEUT%: 69.5 % (ref 39.0–75.0)
RBC: 4.56 10*6/uL (ref 4.20–5.82)
RDW: 13.9 % (ref 11.0–14.6)
WBC: 3.2 10*3/uL — ABNORMAL LOW (ref 4.0–10.3)
lymph#: 0.6 10*3/uL — ABNORMAL LOW (ref 0.9–3.3)

## 2010-11-27 LAB — CREATININE, SERUM
GFR calc Af Amer: >60 mL/min (ref >60)
GFR calc non Af Amer: >60 mL/min (ref >60)

## 2010-11-27 LAB — BUN: BUN: 23 mg/dL (ref 6–23)

## 2010-12-05 ENCOUNTER — Encounter (HOSPITAL_BASED_OUTPATIENT_CLINIC_OR_DEPARTMENT_OTHER): Payer: 59 | Attending: Internal Medicine

## 2010-12-05 DIAGNOSIS — E119 Type 2 diabetes mellitus without complications: Secondary | ICD-10-CM | POA: Insufficient documentation

## 2010-12-05 DIAGNOSIS — Y842 Radiological procedure and radiotherapy as the cause of abnormal reaction of the patient, or of later complication, without mention of misadventure at the time of the procedure: Secondary | ICD-10-CM | POA: Insufficient documentation

## 2010-12-05 DIAGNOSIS — Z79899 Other long term (current) drug therapy: Secondary | ICD-10-CM | POA: Insufficient documentation

## 2010-12-05 DIAGNOSIS — Z85810 Personal history of malignant neoplasm of tongue: Secondary | ICD-10-CM | POA: Insufficient documentation

## 2010-12-05 DIAGNOSIS — Z923 Personal history of irradiation: Secondary | ICD-10-CM | POA: Insufficient documentation

## 2010-12-05 DIAGNOSIS — M278 Other specified diseases of jaws: Secondary | ICD-10-CM | POA: Insufficient documentation

## 2010-12-05 DIAGNOSIS — Z9221 Personal history of antineoplastic chemotherapy: Secondary | ICD-10-CM | POA: Insufficient documentation

## 2010-12-12 ENCOUNTER — Other Ambulatory Visit (HOSPITAL_BASED_OUTPATIENT_CLINIC_OR_DEPARTMENT_OTHER): Payer: Self-pay | Admitting: Internal Medicine

## 2010-12-12 LAB — GLUCOSE, CAPILLARY
Glucose-Capillary: 112 mg/dL — ABNORMAL HIGH (ref 70–99)
Glucose-Capillary: 143 mg/dL — ABNORMAL HIGH (ref 70–99)

## 2010-12-13 ENCOUNTER — Other Ambulatory Visit (HOSPITAL_BASED_OUTPATIENT_CLINIC_OR_DEPARTMENT_OTHER): Payer: Self-pay | Admitting: Internal Medicine

## 2010-12-14 ENCOUNTER — Other Ambulatory Visit (HOSPITAL_BASED_OUTPATIENT_CLINIC_OR_DEPARTMENT_OTHER): Payer: Self-pay | Admitting: Internal Medicine

## 2010-12-14 LAB — GLUCOSE, CAPILLARY: Glucose-Capillary: 128 mg/dL — ABNORMAL HIGH (ref 70–99)

## 2010-12-15 ENCOUNTER — Other Ambulatory Visit (HOSPITAL_BASED_OUTPATIENT_CLINIC_OR_DEPARTMENT_OTHER): Payer: Self-pay | Admitting: Internal Medicine

## 2010-12-15 LAB — GLUCOSE, CAPILLARY: Glucose-Capillary: 119 mg/dL — ABNORMAL HIGH (ref 70–99)

## 2010-12-16 ENCOUNTER — Other Ambulatory Visit (HOSPITAL_BASED_OUTPATIENT_CLINIC_OR_DEPARTMENT_OTHER): Payer: Self-pay | Admitting: Internal Medicine

## 2010-12-16 LAB — GLUCOSE, CAPILLARY
Glucose-Capillary: 143 mg/dL — ABNORMAL HIGH (ref 70–99)
Glucose-Capillary: 140 mg/dL — ABNORMAL HIGH (ref 70–99)

## 2010-12-19 ENCOUNTER — Other Ambulatory Visit (HOSPITAL_BASED_OUTPATIENT_CLINIC_OR_DEPARTMENT_OTHER): Payer: Self-pay | Admitting: Internal Medicine

## 2010-12-19 LAB — CBC
Hemoglobin: 11.5 g/dL — ABNORMAL LOW (ref 13.0–17.0)
MCHC: 33 g/dL (ref 30.0–36.0)
RBC: 3.95 MIL/uL — ABNORMAL LOW (ref 4.22–5.81)
WBC: 3.6 10*3/uL — ABNORMAL LOW (ref 4.0–10.5)

## 2010-12-19 LAB — GLUCOSE, CAPILLARY
Glucose-Capillary: 123 mg/dL — ABNORMAL HIGH (ref 70–99)
Glucose-Capillary: 151 mg/dL — ABNORMAL HIGH (ref 70–99)

## 2010-12-19 LAB — PROTIME-INR
INR: 1 (ref 0.00–1.49)
Prothrombin Time: 13.9 seconds (ref 11.6–15.2)

## 2010-12-20 ENCOUNTER — Other Ambulatory Visit (HOSPITAL_BASED_OUTPATIENT_CLINIC_OR_DEPARTMENT_OTHER): Payer: Self-pay | Admitting: Internal Medicine

## 2010-12-20 LAB — GLUCOSE, CAPILLARY: Glucose-Capillary: 141 mg/dL — ABNORMAL HIGH (ref 70–99)

## 2010-12-21 ENCOUNTER — Other Ambulatory Visit (HOSPITAL_BASED_OUTPATIENT_CLINIC_OR_DEPARTMENT_OTHER): Payer: Self-pay | Admitting: Internal Medicine

## 2010-12-21 LAB — GLUCOSE, CAPILLARY: Glucose-Capillary: 122 mg/dL — ABNORMAL HIGH (ref 70–99)

## 2010-12-22 ENCOUNTER — Other Ambulatory Visit (HOSPITAL_BASED_OUTPATIENT_CLINIC_OR_DEPARTMENT_OTHER): Payer: Self-pay | Admitting: Internal Medicine

## 2010-12-23 ENCOUNTER — Other Ambulatory Visit (HOSPITAL_BASED_OUTPATIENT_CLINIC_OR_DEPARTMENT_OTHER): Payer: Self-pay | Admitting: Internal Medicine

## 2010-12-23 LAB — GLUCOSE, CAPILLARY: Glucose-Capillary: 114 mg/dL — ABNORMAL HIGH (ref 70–99)

## 2010-12-26 ENCOUNTER — Other Ambulatory Visit (HOSPITAL_BASED_OUTPATIENT_CLINIC_OR_DEPARTMENT_OTHER): Payer: Self-pay | Admitting: Internal Medicine

## 2010-12-26 LAB — GLUCOSE, CAPILLARY
Glucose-Capillary: 15 mg/dL — CL (ref 70–99)
Glucose-Capillary: 125 mg/dL — ABNORMAL HIGH (ref 70–99)
Glucose-Capillary: 142 mg/dL — ABNORMAL HIGH (ref 70–99)

## 2010-12-27 ENCOUNTER — Other Ambulatory Visit (HOSPITAL_BASED_OUTPATIENT_CLINIC_OR_DEPARTMENT_OTHER): Payer: Self-pay | Admitting: Internal Medicine

## 2010-12-27 LAB — GLUCOSE, CAPILLARY: Glucose-Capillary: 141 mg/dL — ABNORMAL HIGH (ref 70–99)

## 2010-12-28 ENCOUNTER — Other Ambulatory Visit (HOSPITAL_BASED_OUTPATIENT_CLINIC_OR_DEPARTMENT_OTHER): Payer: Self-pay | Admitting: Internal Medicine

## 2010-12-28 LAB — GLUCOSE, CAPILLARY: Glucose-Capillary: 133 mg/dL — ABNORMAL HIGH (ref 70–99)

## 2010-12-29 ENCOUNTER — Other Ambulatory Visit (HOSPITAL_BASED_OUTPATIENT_CLINIC_OR_DEPARTMENT_OTHER): Payer: Self-pay | Admitting: Internal Medicine

## 2010-12-30 ENCOUNTER — Other Ambulatory Visit (HOSPITAL_BASED_OUTPATIENT_CLINIC_OR_DEPARTMENT_OTHER): Payer: Self-pay | Admitting: Internal Medicine

## 2010-12-30 LAB — GLUCOSE, CAPILLARY: Glucose-Capillary: 149 mg/dL — ABNORMAL HIGH (ref 70–99)

## 2011-01-02 ENCOUNTER — Other Ambulatory Visit (HOSPITAL_BASED_OUTPATIENT_CLINIC_OR_DEPARTMENT_OTHER): Payer: Self-pay | Admitting: Internal Medicine

## 2011-01-02 LAB — GLUCOSE, CAPILLARY
Glucose-Capillary: 145 mg/dL — ABNORMAL HIGH (ref 70–99)
Glucose-Capillary: 169 mg/dL — ABNORMAL HIGH (ref 70–99)

## 2011-01-03 ENCOUNTER — Other Ambulatory Visit (HOSPITAL_BASED_OUTPATIENT_CLINIC_OR_DEPARTMENT_OTHER): Payer: Self-pay | Admitting: Internal Medicine

## 2011-01-03 ENCOUNTER — Encounter (HOSPITAL_BASED_OUTPATIENT_CLINIC_OR_DEPARTMENT_OTHER): Payer: 59 | Attending: Internal Medicine

## 2011-01-03 DIAGNOSIS — Z85810 Personal history of malignant neoplasm of tongue: Secondary | ICD-10-CM | POA: Insufficient documentation

## 2011-01-03 DIAGNOSIS — E119 Type 2 diabetes mellitus without complications: Secondary | ICD-10-CM | POA: Insufficient documentation

## 2011-01-03 DIAGNOSIS — Z79899 Other long term (current) drug therapy: Secondary | ICD-10-CM | POA: Insufficient documentation

## 2011-01-03 DIAGNOSIS — Z9221 Personal history of antineoplastic chemotherapy: Secondary | ICD-10-CM | POA: Insufficient documentation

## 2011-01-03 DIAGNOSIS — Y842 Radiological procedure and radiotherapy as the cause of abnormal reaction of the patient, or of later complication, without mention of misadventure at the time of the procedure: Secondary | ICD-10-CM | POA: Insufficient documentation

## 2011-01-03 DIAGNOSIS — Z923 Personal history of irradiation: Secondary | ICD-10-CM | POA: Insufficient documentation

## 2011-01-03 DIAGNOSIS — M278 Other specified diseases of jaws: Secondary | ICD-10-CM | POA: Insufficient documentation

## 2011-01-04 LAB — GLUCOSE, CAPILLARY: Glucose-Capillary: 138 mg/dL — ABNORMAL HIGH (ref 70–99)

## 2011-01-05 ENCOUNTER — Other Ambulatory Visit (HOSPITAL_BASED_OUTPATIENT_CLINIC_OR_DEPARTMENT_OTHER): Payer: Self-pay | Admitting: Internal Medicine

## 2011-01-05 LAB — GLUCOSE, CAPILLARY: Glucose-Capillary: 170 mg/dL — ABNORMAL HIGH (ref 70–99)

## 2011-01-06 ENCOUNTER — Other Ambulatory Visit (HOSPITAL_BASED_OUTPATIENT_CLINIC_OR_DEPARTMENT_OTHER): Payer: Self-pay | Admitting: Internal Medicine

## 2011-01-06 LAB — GLUCOSE, CAPILLARY: Glucose-Capillary: 151 mg/dL — ABNORMAL HIGH (ref 70–99)

## 2011-01-09 ENCOUNTER — Ambulatory Visit: Payer: 59 | Attending: Radiation Oncology | Admitting: Radiation Oncology

## 2011-01-09 ENCOUNTER — Other Ambulatory Visit (HOSPITAL_BASED_OUTPATIENT_CLINIC_OR_DEPARTMENT_OTHER): Payer: Self-pay | Admitting: Internal Medicine

## 2011-01-10 ENCOUNTER — Other Ambulatory Visit (HOSPITAL_BASED_OUTPATIENT_CLINIC_OR_DEPARTMENT_OTHER): Payer: Self-pay | Admitting: Internal Medicine

## 2011-01-10 LAB — GLUCOSE, CAPILLARY
Glucose-Capillary: 135 mg/dL — ABNORMAL HIGH (ref 70–99)
Glucose-Capillary: 302 mg/dL — ABNORMAL HIGH (ref 70–99)

## 2011-01-11 ENCOUNTER — Other Ambulatory Visit (HOSPITAL_BASED_OUTPATIENT_CLINIC_OR_DEPARTMENT_OTHER): Payer: Self-pay | Admitting: Internal Medicine

## 2011-01-11 LAB — GLUCOSE, CAPILLARY: Glucose-Capillary: 137 mg/dL — ABNORMAL HIGH (ref 70–99)

## 2011-01-12 ENCOUNTER — Other Ambulatory Visit (HOSPITAL_BASED_OUTPATIENT_CLINIC_OR_DEPARTMENT_OTHER): Payer: Self-pay | Admitting: Internal Medicine

## 2011-01-13 ENCOUNTER — Other Ambulatory Visit (HOSPITAL_BASED_OUTPATIENT_CLINIC_OR_DEPARTMENT_OTHER): Payer: Self-pay | Admitting: Internal Medicine

## 2011-01-13 LAB — GLUCOSE, CAPILLARY: Glucose-Capillary: 170 mg/dL — ABNORMAL HIGH (ref 70–99)

## 2011-01-16 ENCOUNTER — Other Ambulatory Visit (HOSPITAL_BASED_OUTPATIENT_CLINIC_OR_DEPARTMENT_OTHER): Payer: Self-pay | Admitting: Internal Medicine

## 2011-01-16 LAB — GLUCOSE, CAPILLARY
Glucose-Capillary: 167 mg/dL — ABNORMAL HIGH (ref 70–99)
Glucose-Capillary: 136 mg/dL — ABNORMAL HIGH (ref 70–99)

## 2011-01-17 ENCOUNTER — Other Ambulatory Visit (HOSPITAL_BASED_OUTPATIENT_CLINIC_OR_DEPARTMENT_OTHER): Payer: Self-pay | Admitting: Internal Medicine

## 2011-01-17 LAB — GLUCOSE, CAPILLARY: Glucose-Capillary: 188 mg/dL — ABNORMAL HIGH (ref 70–99)

## 2011-01-17 NOTE — Op Note (Signed)
NAMEMANLEY, TUNKS                ACCOUNT NO.:  [id number]   MEDICAL RECORD NO.:  [id number]          PATIENT TYPE:  AMB   LOCATION:  [place]                          FACILITY:  [facility]   PHYSICIAN:  Christopher E. [name], M.D.DATE OF BIRTH:  [birth year]   DATE OF PROCEDURE:  10/18/2007  DATE OF DISCHARGE:                               OPERATIVE REPORT   PREOPERATIVE DIAGNOSIS:  Base of tongue mass.   POSTOPERATIVE DIAGNOSIS:  Base of tongue mass.   OPERATION PERFORMED:  Direct laryngoscopy and biopsy of base of tongue  mass.   SURGEON:  Myrtie Neither. [name], M.D.   ANESTHESIA:  General endotracheal.   COMPLICATIONS:  None.   BRIEF CLINICAL NOTE:  [name] is a 55 year old gentleman who first  noted a knot on the left side of his neck several weeks ago.  He  underwent a CT scan which showed multiple left neck adenopathy and a  base of tongue mass.  He does not describe any difficulty swallowing or  sore throat, but on exam in the office he has a base of tongue mass  consistent with probable neoplasm with metastasis to left neck nodes.  He is taken to the operating room at this time for a direct laryngoscopy  and biopsy.   DESCRIPTION OF PROCEDURE:  After adequate endotracheal anesthesia a  direct laryngoscopy was performed.  There was a smooth mass in the left  side of the base of tongue that was firm to palpation.  The AV folds,  piriform sinuses, false and true vocal cords were clear.  The  laryngoscope was suspended.  Photos were obtained of the base of the  tongue mass and then biopsies of the base of tongue mass were performed.  The patient received 1 gram Ancef intraoperatively along with Decadron.  This completed the procedure .  [name] was awoken from anesthesia and  transferred to the recovery room and postop doing well.   DISPOSITION:  [name] was discharged home later this morning on Tylenol  and Vicodin p.r.n. pain and topical throat spray to help with the sore  throat.  He will have a follow-up in my office in 4-5 days to review  pathology and plan further therapy.           ______________________________  [name]. [name], M.D.     CEN/MEDQ  D:  10/18/2007  T:  10/19/2007  Job:  [id number]   cc:   Kathyrn Lass, M.D.

## 2011-01-17 NOTE — Discharge Summary (Signed)
NAMEJAYANT, [name] NO.:  [id number]   MEDICAL RECORD NO.:  [id number]          PATIENT TYPE:  INP   LOCATION:  2013                         FACILITY:  [facility]   PHYSICIAN:  Annita Brod, M.D.DATE OF BIRTH:  Jul 17, 1956   DATE OF ADMISSION:  05/15/2008  DATE OF DISCHARGE:  05/19/2008                               DISCHARGE SUMMARY   PRIMARY CARE PHYSICIAN:  Melinda Crutch, M.D.   ONCOLOGIST:  Jeanie Cooks, M.D.   DISCHARGE DIAGNOSES:  1. Bilateral pulmonary emboli, acute.  2. Head and neck cancer.  3. Diabetes mellitus.  4. Hyperlipidemia.   DISCHARGE MEDICATIONS:  The patient was resumed his previous medications  of Actos 45, Amaryl 4, Vytorin 10/20.  He will continue on Lovenox 160  mg subcu daily.  This medicine will be continued until his INR is  therapeutic.  He has been advised to, at the very least, take this  medicine on May 20, 2008 for a full 5-day overlap.   NEW MEDICINES:  The patient will take Coumadin 7.5 mg p.o. tonight,  May 19, 2008 and continue with that regimen until his INR is  therapeutic.  Once his INR is therapeutic, he will stay on a regimen of  5 mg q.h.s. unless this is changed by his oncologist.   HOSPITAL COURSE:  The patient is 55 year old white male with a past  medical history of head and neck CA who was recently diagnosed with DVT  and started on Lovenox.  He received his Lovenox shot on May 14, 2008 and then started having a dry cough which persisted through the  night.  No hemoptysis.  When the cough did not resolve, he came into the  emergency room.  There he was found to have bilateral pulmonary emboli.  He was admitted.  He was started on subcu heparin and there was a  question of whether or not the patient had failed anticoagulation  therapy.  Dr. Ralene Ok was consulted who, after an evaluation of the  patient, stated that he felt that it was impossible to know whether the  pulmonary emboli  developed before or after he had started Lovenox  therapy and therefore would not conclude the patient had failed heparin  and requiring an IVC filter.  He recommended continued hospitalization,  continued monitoring, IV unfractionated heparin infusion and repeated CT  scan angio of the chest on the 15th, which at that time would be several  days out and able to rule out if the patient had new pulmonary emboli,  which would then confirm whether or not the patient had failed  treatment.  The patient otherwise kept his leg elevated and he remained  stable.  He was started on Coumadin, which was okayed by Dr. Ralene Ok,  and on May 19, 2008, the patient's CT angio of his chest noted  improved pulmonary embolic burden without evidence of acute embolism,  scattered lung nodules, and some borderline pulmonary artery enlargement  suggesting a component of pulmonary arterial hypertension.  With these  findings the patient was now stable, comfortably breathing on room air,  and at this point his INR was 1.8.  He had a large rise from the  previous day from an of INR 1.3 after  receiving 10 of Coumadin and the  pharmacy then advising him to go with 7.5 tonight to get his Coumadin  level between 2 and 3, and at that point he can stay on 5 mg.  The  patient's other medical issues, including his diabetes, were stable and  oncology will follow him for his head and neck cancer.   PLAN:  The patient will be discharged home.  Home health will be set up  and check a repeat PT/INR on the morning of May 20, 2008 with the  results phoned in to Dr. Ralene Ok at the [facility].  The  patient's disposition is improved.  His activity will be slowly  increased.  Discharge diet will be a heart-healthy carb-modified diet  and he is being discharged to home.  Follow-up appointments will be with  Dr. Ralene Ok this week and Dr. Harrington Challenger in several weeks.      Annita Brod, M.D.   Electronically Signed     SKK/MEDQ  D:  05/19/2008  T:  05/19/2008  Job:  [id number]   cc:   Jeanie Cooks, M.D.  Myriam Jacobson, M.D.

## 2011-01-17 NOTE — H&P (Signed)
NAMEKWON, VERDUN NO.:  [id number]   MEDICAL RECORD NO.:  [id number]          PATIENT TYPE:  INP   LOCATION:  2013                         FACILITY:  [facility]   PHYSICIAN:  Estelle June, MD       DATE OF BIRTH:  [birth year]   DATE OF ADMISSION:  05/15/2008  DATE OF DISCHARGE:                              HISTORY & PHYSICAL   REASON FOR ADMISSION:  Cough, persistent since yesterday afternoon.   HISTORY OF PRESENT ILLNESS:  This is a 55 year old white male with  history of head and neck cancer, recently diagnosed with left lower  extremity DVT, was started on Lovenox.  According to the patient when he  went to get his Lovenox shot yesterday started having dry cough, which  persisted throughout the night.  The patient denies any sputum or  hemoptysis.  When the cough did not resolve, the patient decided to come  to the emergency room.  The patient denies any nausea, vomiting, fevers,  chills, or chest pain.  In the ER, CT of the chest revealed bilateral  acute pulmonary PEs.  The patient was admitted for further management.   PAST MEDICAL HISTORY:  Hyperlipidemia, diabetes, and the head and neck  cancer.   HOME MEDICATIONS:  Include Vytorin, Lovenox, Actos, and Amaryl.   SOCIAL HISTORY:  He is a nonsmoker.  No drug use.  Occasional drinker.   FAMILY HISTORY:  Noncontributory.   REVIEW OF SYSTEMS:  Please refer to the HPI, otherwise 14-point review  of system was negative.   PHYSICAL EXAMINATION:  VITAL SIGNS:  Temperature of 99.6, blood pressure  132/85, pulse 110, and respirations 16.  The patient was satting 98% on  room air.  GENERAL:  No acute distress.  Awake and alert.  CARDIOVASCULAR:  Regular rate and rhythm.  Tachycardiac.  LUNGS:  Clear to auscultation anteriorly.  Left lower base, there are  minimal crackles.  LOWER EXTREMITIES:  His left side was swollen with +1 edema.  No redness  or tenderness was noted.   LABORATORY DATA:  Labs showed a  hemoglobin of 10.7, crit of 31.8, white  count 6.9, and platelets 196.  Sodium 137, potassium 4.1, chloride 101,  bicarb 27, BUN 19, creatinine 0.9, and glucose 97.  T-bili was 0.9, AST  23, ALT 25, albumin 3.4, and calcium 9.4.  CT showed acute bilateral PE  in the pulmonary arteries.   ASSESSMENT AND PLAN:  A 55 year old white male with head and neck  cancer, recent diagnosis of DVT, coming in with cough, found to have  bilateral PEs on CT of the chest.  Plan is to admit the patient to a  tele bed.  Based on his outpatient Lovenox schedule, he was getting  Lanoxin once a day dosing, which was slightly  underdosed.  Plan is to do 1 mg/kg per pharmacy recommendations q.12  dosing.  Also, the patient will need to be evaluated if he is a  candidate for Greenfield filter.  If the patient is not a candidate for  Greenfield filter and he remains hemodynamically stable in 24  hours, he  will be ready for discharge.      Estelle June, MD  Electronically Signed     RP/MEDQ  D:  05/16/2008  T:  05/16/2008  Job:  [id number]

## 2011-01-18 ENCOUNTER — Other Ambulatory Visit (HOSPITAL_BASED_OUTPATIENT_CLINIC_OR_DEPARTMENT_OTHER): Payer: Self-pay | Admitting: Internal Medicine

## 2011-01-19 ENCOUNTER — Other Ambulatory Visit (HOSPITAL_BASED_OUTPATIENT_CLINIC_OR_DEPARTMENT_OTHER): Payer: Self-pay | Admitting: Internal Medicine

## 2011-01-19 LAB — GLUCOSE, CAPILLARY: Glucose-Capillary: 116 mg/dL — ABNORMAL HIGH (ref 70–99)

## 2011-01-20 ENCOUNTER — Other Ambulatory Visit (HOSPITAL_BASED_OUTPATIENT_CLINIC_OR_DEPARTMENT_OTHER): Payer: Self-pay | Admitting: Internal Medicine

## 2011-01-20 LAB — GLUCOSE, CAPILLARY
Glucose-Capillary: 140 mg/dL — ABNORMAL HIGH (ref 70–99)
Glucose-Capillary: 139 mg/dL — ABNORMAL HIGH (ref 70–99)

## 2011-01-20 NOTE — Op Note (Signed)
NAME:  [name], [name]                          ACCOUNT NO.:  [id number]   MEDICAL RECORD NO.:  [id number]                   PATIENT TYPE:  INP   LOCATION:  [id number]                                 FACILITY:  [facility]   PHYSICIAN:  Satira Anis. Blanchie Dessert, M.D.         DATE OF BIRTH:  05-Jul-1956   DATE OF PROCEDURE:  10/25/2002  DATE OF DISCHARGE:                                 OPERATIVE REPORT   PREOPERATIVE DIAGNOSIS:  Right open wrist fracture (radius and ulna) with  distinct comminution and likely triangular fibrocartilage complex tear.  This is a type I open fracture with extensive comminution and gross  placement.   POSTOPERATIVE DIAGNOSIS:  Right open wrist fracture (radius and ulna) with  distinct comminution and likely triangular fibrocartilage  complex tear.  This is a type I open fracture with extensive comminution and gross  placement, with notable peripheral triangular fibrocartilage tear, which was  non-destabilizing in terms of the distal radioulnar joint.   PROCEDURES:  1. Irrigation and debridement, skin and subcutaneous tissue, tendon, muscle,     and bony tissue, right type I open distal radius fracture about the ulna     region.  2. Open reduction and internal fixation, complex comminuted right distal     radius fracture with DVR plate fixation and MIG synthetic bone grafting     utilizing calcium sulfate from [facility].  3. Right wrist posterior interosseous nerve neurectomy.  4. Third dorsal compartment tendon sheath release with transposition of the     extensor pollicis longus tendon.  5. Open triangular fibrocartilage complex peripheral repair.  6. Stress radiography,   SURGEON:  Satira Anis. Amedeo Plenty, M.D.   COMPLICATIONS:  None.   TOURNIQUET TIME:  One hour.   ESTIMATED BLOOD LOSS:  Minimal.   DRAINS:  One.   INDICATION FOR PROCEDURE:  This patient is a 55 year old male who is right-  hand dominant and works as an Conservation officer, [employer].  He was at a gas  station  and fell, fracturing his right wrist.  He denies other complaints.  He was  alert and oriented and accompanied with his wife, Osei Gorsky, who I know  well.  I have discussed with the patient his upper extremity predicament and  findings.  I have discussed with him the risks of bleeding, infection,  anesthesia, damage to normal structures, and failure of the surgery to  accomplish its intended goals of relieving symptoms and restoring function.  With this in mind, they desire to proceed.  All questions have been  encouraged and answered preoperatively.   They understand the risk of nonunion, malunion, infection, chronic TFC  problems, etc.  I have also discussed with them the risk of dystrophic  reaction, etc.  With this in mind, they desire to proceed.   DESCRIPTION OF PROCEDURE:  The patient was seen by myself and anesthesia.  He was given preoperative Ancef in the operating room and  prior to surgery  when he first came to the emergency department.  The patient underwent a  smooth induction of general anesthesia under the direction of Karsten Ro, M.D.  I then applied a tourniquet, appropriately padded the  patient, turned the table nicely, and performed a prep and drape with  Betadine scrub and paint.  Following securing a sterile field, the operation  commenced with an incision about the open site, which was a type I open  injury about the ulna.  A 1 mm ellipse of skin was removed sharply with a  knife blade.  Following this, distal and proximal extensions 1-2 cm in  length were created.  The dorsal sensory branch of the ulnar nerve was  identified and retracted out of harm's way and access to the open fracture  site was gained.  This was the distal ulna.  I performed a curettage of the  bone and excision of any devitalized muscle and subcu tissue.  Greater than  1.5-2 L of irrigant, antibiotic in nature, was placed in the wound for  lavage.  The patient did have a  significant fracture about the styloid  region noted.  Once the I&D was complete about the open fracture site, I  then changed gloves and drapes and removed the instruments as previously  used for the I&D portion of the case, as they were considered dirty.   Once this was done the patient had the arm elevated.  The tourniquet was  insufflated to 250 mmHg and following this, we made a volar approach to the  wrist.  This was an FCR sheath-splitting incision.  I did this with 10  pounds of fingertrap traction placed along the hand.  Once this was done,  the carpal canal contents and FCR were retracted ulnarly.  The pronator  quadratus was identified and incised in an L-shaped fashion and reflected  radial to ulnarly.  Retractors were placed.  I then reduced the fracture,  which was notably comminuted both dorsally and volarly.  Once anatomic  reduction was accomplished, I placed provisional Kirschner wires for  fixation.  These were two 0.062 K-wires.  Following this we applied a DVR  distal volar radius plate from [employer] in standard technique.  This  achieved excellent purchase with the pegs distally.  I was very pleased with  this and following application of the plate using standard AO technique, I  then removed the K-wires that were provisionally fixating the fracture site  and then performed a range of motion, which revealed good stability and a  significant dorsal V defect.  Stress radiography performed allowed me to  look quite nicely at the fracture, which was stabilized nicely.  There was a  noted large dorsal V metaphyseal bone loss V defect.  Once this was known, I  then repaired the pronator quadratus and took x-rays of the fracture, which  was reduced anatomically about the radius.  I was very pleased with this.  The noted metaphyseal comminution would require bone grafting, I noted.  The pronator quadratus was repaired nicely with 3-0 Vicryl.  Following this we  placed  a TLS drain in the depths of the wound.  Once this was done attention  was turned dorsally, where a 3 cm incision was made.  Dissection was carried  down to the EPL tendon sheath, which was incised.  The EPL was then  transposed into the subcu tissue just above the retinaculum.  This was  released  nicely.  Following this the interval between the third and fourth  dorsal compartments was opened and the posterior interosseous nerve was  identified and underwent a crushing and cauterization technique for  posterior interosseous nerve neurectomy, which was performed without  difficulty.  Following this I prepared the MIG bone filler from [facility] in standard fashion and then placed this in the large dorsal V  defect.  I did this under radiographic image to ensure obliteration of the  defect.  This was done to my satisfaction without difficulty and allowed to  harden.  Once this was done and the bone grafting was secured as well as the  PIN neuroectomy and tendon sheath incision with EPL transposed, I then  deflated the tourniquet, irrigated the wound copiously, and closed it.  Following this I returned attention toward the [facility] region, where an open TFC  repair was accomplished.  The area was further irrigated in the peripheral  portions, and the volar capsular ligaments were repaired to the capsular  portions of the TFC.  This was done without difficulty.  I took care to  protect the dorsal sensory ulnar nerve during this, which was identified and  traced.  The patient did, prior to [facility] repair, have good stability in  pronation, supination, and neutral without destabilization from the tearing.  However, given the patient's age, hand dominance, and all issues, I did  perform the capsular repair.  Once this was done, I then closed the wound  except for the open area with Prolene and following this turned attention  back toward the volar radial wound, which was irrigated copiously as it  was  during multiple points of the case, and then closed with a combination of  Vicryl on the subcu and Prolene in the skin edge.  All soft tissues  compartments were soft and without signs of compartment syndrome.  The hand  had excellent refill, and I then placed Marcaine without epinephrine 0.25%  along the incisions that were made for postop analgesia.  The patient  tolerated this well, had sterile dressings applied.  The drain was hooked up  to suction and a long-arm splint with the arm in supination was then placed.  Once this was done he was awoken from anesthesia and transferred to the  recovery room, where he was noted to be in stable condition, able to move  his fingers nicely, with excellent refill and good sensation.   He will be admitted for IV antibiotics, observation, pain management,  elevation, and other measures.  I have gone over do's and don'ts with him at length and all questions have been encouraged and answered.  It has been a  pleasure to participate in his care, and we look forward to participating in  his postop recovery.                                               Satira Anis. Blanchie Dessert, M.D.    Sampson Si  D:  10/26/2002  T:  10/26/2002  Job:  [id number]   cc:   Vern Claude. Prudence Davidson, M.D.  [place]  [place] [address]  Fax: [phone]

## 2011-01-20 NOTE — Op Note (Signed)
NAME:  [name], PISANO                ACCOUNT NO.:  [id number]   MEDICAL RECORD NO.:  [id number]          PATIENT TYPE:  AMB   LOCATION:  [place]O                         FACILITY:  [facility]   PHYSICIAN:  James L. Rolla Flatten., M.D.DATE OF BIRTH:  [birth year]   DATE OF PROCEDURE:  DATE OF DISCHARGE:                                 OPERATIVE REPORT   Audio too short to transcribe (less than 5 seconds)       JLE/MEDQ  D:  02/27/2005  T:  02/27/2005  Job:  [id number]

## 2011-01-20 NOTE — Discharge Summary (Signed)
NAME:  [name], [name]                          ACCOUNT NO.:  [id number]   MEDICAL RECORD NO.:  [id number]                   PATIENT TYPE:  INP   LOCATION:  [id number]                                 FACILITY:  [facility]   PHYSICIAN:  Satira Anis. Amedeo Plenty, M.D.             DATE OF BIRTH:  Jun 09, 1956   DATE OF ADMISSION:  10/25/2002  DATE OF DISCHARGE:  10/28/2002                                 DISCHARGE SUMMARY   ADMISSION DIAGNOSES:  1. Open distal radius and ulnar fracture about the right upper extremity.  2. Diabetes mellitus.   DISCHARGE DIAGNOSES:  1. Open distal radius and ulnar fracture about the right upper extremity,     improved.  2. Diabetes mellitus.   PROCEDURES:  Irrigation and debridement and open reduction internal fixation  of right distal radius fracture.   SURGEONS:  Satira Anis. Amedeo Plenty, M.D.   CONSULTATIONS:  None.   HISTORY OF PRESENT ILLNESS:  The patient is a 55 year old male who is  admitted through the [facility] Emergency Room for the above  mentioned diagnosis, seen and evaluated by Dr. Amedeo Plenty.  Given the nature of  his fracture, as it was an open fracture, he was scheduled to undergo an I&D  as well as open reduction internal fixation.  Appropriate preoperative  laboratories were obtained and were satisfactory to proceed with surgery.   HOSPITAL COURSE:  The patient was admitted on 10/25/02, and underwent the  above operative procedures without difficulty.  Please refer to operative  note for details.  He was started on routine postoperative orders.  On  postoperative day #1, he was alert and oriented, his vital signs were  stable, he was afebrile, he was neurovascularly intact.  He was placed on  Ancef postoperatively for antibiotic prophylaxis.  On postoperative day #2,  he was doing fairly well.  He was still having a great deal of difficulty  with pain he described as an 8/10, but improving with both PCA as well as  oral pain medications.  His  right upper extremity was in a sling.  He had  good active passive range of motion, no signs of compartment syndrome,  neurovascularly he was intact, sensation was intact, no edema was noted.  The following day his pain was much better controlled on p.o. medications.  He was very eager to go home.  His vital signs were stable.  He had a low-  grade fever of 99.1.  Evaluation of the right upper extremity showed that  the digits were pink, warm, and dry.  Capillary refill was less then two  seconds.  Neurovascularly intact, sensation intact, mild edema was noted.   FINAL DIAGNOSIS:  Status post open reduction internal fixation of distal  radius fracture, type one open fracture.   CONDITION ON DISCHARGE:  Improved.   DIET:  Regular.   ACTIVITY:  Keep elevated, keep clean, dry, and intact, move fingers  frequently to the sides, the digits frequently.   DISCHARGE MEDICATIONS:  1. Mepergan Fortes.  2. Robaxin.  3. Peri-Colace.   FOLLOWUP:  Dr. Amedeo Plenty in 8 to 10 days, he is to call [phone] for an  appointment, questions, or concerns.       Avelina Laine, P.A.-C.                   Satira Anis. Amedeo Plenty, M.D.    BB/MEDQ  D:  11/21/2002  T:  11/22/2002  Job:  [id number]

## 2011-01-20 NOTE — Op Note (Signed)
NAME:  [name], [name]                ACCOUNT NO.:  [id number]   MEDICAL RECORD NO.:  [id number]          PATIENT TYPE:  AMB   LOCATION:  ENDO                         FACILITY:  [facility]   PHYSICIAN:  James L. Rolla Flatten., M.D.DATE OF BIRTH:  1955-12-01   DATE OF PROCEDURE:  02/27/2005  DATE OF DISCHARGE:                                 OPERATIVE REPORT   PROCEDURE:  Colonoscopy.   MEDICATIONS:  1.  Fentanyl 100 mcg.  2.  Versed 9 mg IV.   SCOPE:  Olympus pediatric adjustable colonoscope.   INDICATION:  Colon cancer screening.   DESCRIPTION OF PROCEDURE:  Procedure explained to the patient and consent  obtained.  Left lateral decubitus position, digital exam performed, scope  inserted, advanced, the prep excellent, ileocecal valve and appendiceal  orifice seen.  Scope withdrawn, the __________ carefully examined, no  polyps, no diverticulosis __________.  Retroflexed view showed no other  findings.  Scope withdrawn.  Patient tolerated the procedure well.   ASSESSMENT:  Normal screening colonoscopy.  V76.51.   PLAN:  Recommend yearly Hemoccults and possibly repeat procedure in 10  years.       JLE/MEDQ  D:  02/27/2005  T:  02/27/2005  Job:  [id number]   cc:   C. Melinda Crutch, M.D.  [address]  [place]  [place] [address]  Fax: [phone]

## 2011-01-20 NOTE — Op Note (Signed)
NAME:  EMORI, GALAYDA NO.:  [id number]   MEDICAL RECORD NO.:  [id number]                   PATIENT TYPE:  CINP   LOCATION:                                       FACILITY:  [facility]   PHYSICIAN:  Fransico Him, M.D.                  DATE OF BIRTH:  04/30/1956   DATE OF PROCEDURE:  DATE OF DISCHARGE:                                 OPERATIVE REPORT   This is a very pleasant 55 year old white male with a history of shoulder  pain, diabetes mellitus, and hyperlipidemia, who presented with EKG changes  and now is undergoing cardiac catheterization.  The patient was brought to  the cardiac catheterization in the fasting, nonsedated state.  Informed  consent was obtained.  The patient was connected to continuous heart rate  and pulse oximetry monitoring and intermittent blood pressure monitoring.  The right groin was prepped and draped in a sterile fashion.  Xylocaine 1%  was used for local anesthesia.  Using the modified Seldinger technique, a 7  French sheath was placed in the right femoral artery.  The patient did  receive 2 mg of IV Versed.  Under fluoroscopic guidance a 6 [place] JL4  catheter was placed in the left coronary artery.  Multiple cine films were  taken in the 30 degree RAO and 40 degree LAO views.  This catheter was next  changed out over a guidewire for a 6 [place] JR4 catheter, which was placed  under fluoroscopic guidance into the right coronary artery.  Multiple cine  films were taken in the 30 degree RAO and 40 degree LAO views.  This  catheter was then exchanged out over a guidewire for a 6 French angled  pigtail catheter, which was placed under fluoroscopic guidance into the left  ventricular cavity.  Left ventriculography was performed in the 30 degree  RAO view using a total of 30 mL of contrast at 13 mL/sec.  The catheter was  then pulled back across the aortic valve.  At the end of the procedure the  catheters and sheaths were removed.   Manual compression was performed until  adequate hemostasis was obtained.  The catheterization was complicated by a  right groin hematoma.  At the end of the procedure the patient was  transferred back to the room in stable condition.   RESULTS:  1. The left main coronary artery was widely patent.  It bifurcates into a     left anterior descending artery and left circumflex artery.  2. The LAD is widely patent throughout its course and gives rise to one very     large diagonal branch, which then branches into two diagonal branches,     all of which are widely patent.  3. The left circumflex gives rise to a high obtuse marginal 1 which is     widely patent and then gives rise  to a second obtuse marginal branch,     which is very large and bifurcates into two daughter vessels, both of     which are widely patent.  The left circumflex is widely throughout its     course and terminates distally.  4. The right coronary artery is widely patent with luminal irregularities     and then distally bifurcates into a posterior descending artery and     posterolateral artery, both of which are widely patent.  5. Left ventriculography revealed normal left ventricular systolic function.   ASSESSMENT:  1. Chest pain with normal coronary arteries.  2. Normal left ventricular function.   PLAN:  1. Discontinue IV heparin and Integrilin.  2. Continue Pravachol, aspirin, and Actos.                                              Fransico Him, M.D.   TT/MEDQ  D:  04/06/2003  T:  04/06/2003  Job:  [id number]

## 2011-01-20 NOTE — H&P (Signed)
NAME:  [name], [name]                          ACCOUNT NO.:  [id number]   MEDICAL RECORD NO.:  [id number]                   PATIENT TYPE:  INP   LOCATION:  2901                                 FACILITY:  [facility]   PHYSICIAN:  Fransico Him, MD                    DATE OF BIRTH:  11/04/1955   DATE OF ADMISSION:  04/05/2002  DATE OF DISCHARGE:  04/06/2002                                HISTORY & PHYSICAL   PRIMARY CARE PHYSICIAN:  C. Gus Height, M.D.   CHIEF COMPLAINT:  Shoulder pain.   HISTORY OF PRESENT ILLNESS:  The patient is a pleasant 55 year old white  male with no previous cardiac history.  He awoke the morning of admission  with left shoulder pain radiating to the left elbow.  He mowed the yard with  some pain in the left arm.  He went grocery shopping and the pain became  severe.  Pain was interpreted as severe rated as a 4/10.  He denies  associated shortness of breath, nausea, vomiting, nor diaphoresis.  No  change in discomfort with nitrates.  At the time of evaluation, the patient  states the pain is gone, but slight residual ache initially.   PAST MEDICAL HISTORY:  1. Diabetes mellitus, non-insulin dependent, six months.  No history of     hypertension nor cerebrovascular accident.  2. History of dyslipidemia, on Pravachol.   ALLERGIES:  No known drug allergies.  No problems with seafood, shellfish,  nor with iodine products.   MEDICATIONS:  1. Pravachol 40 mg q.d.  2. Actos 30 mg q.d.  3. Aspirin 81 mg p.o. q.d.   SOCIAL HISTORY:  The patient is married with two children.  Denies alcohol  or tobacco use.   FAMILY HISTORY:  Mother with dementia at age 22.  Father died of pulmonary  emboli at 59.  No family history of coronary artery disease.   REVIEW OF SYMPTOMS:  As per HPI/previous medical history, otherwise  negative.   PAST SURGICAL HISTORY:  Right knee surgery in 1966.   PHYSICAL EXAMINATION:  VITAL SIGNS:  Blood pressure 120/69, heart rate 77  and  regular, he is afebrile, weight 252 pounds, he is 6 feet 3 inches, O2  saturation 98% on 2 L.  HEENT:  Brisk bilateral carotid upstrokes without bruits.  NECK:  No JVD nor thyromegaly.  CHEST:  Lung sounds clear with equal excursion.  CARDIAC:  Regular rate and rhythm without murmurs, rubs, or gallops, normal  S1 and S2.  ABDOMEN:  Soft, nontender, normoactive bowel sounds, nondistended, no  hepatosplenomegaly.  EXTREMITIES:  There are+2/4 dorsalis pedis pulses bilaterally, no edema.   LABORATORY DATA:  EKG revealed normal sinus rhythm, fine J-point elevation 2  mm V2, 1 mm V3 and V4.  Q-wave V1 and V2.  Hemoglobin 14, hematocrit 41.3,  white blood cell count 7.2, platelets 202.  CK 132,  MB fraction 0.7,  troponin-I 0.01.  Coags within normal limits.  Sodium 141, potassium 3.8,  chloride 107, CO2 25, glucose 95, BUN 17, creatinine 1.0, calcium 9.4.  Liver function tests all within normal range.   IMPRESSION:  1. Shoulder pain consistent with angina equivalent in this 55 year old     diabetic male without other significant risk factors of coronary artery     disease.  First set of cardiac enzymes are negative on this admission.     Chest x-ray is negative for congestive heart failure.  However, he does     have ST elevation in V2 and V3 with Q-wave in V1 and V2.  2. History of dyslipidemia, on Pravachol, management by primary care.  3. Non-insulin dependent diabetes mellitus.   PLAN:  1. Admit to telemetry, rule out myocardial infarction protocol, serial     cardiac enzymes, daily EKG.  2. IV heparin/Integrilin drip.  3. IV nitroglycerin drip.  4. Aspirin/Plavix/Lopressor.  5. Check fasting lipid profile.  6. Cardiac catheterization anticipated for Monday or sooner if recurrent     chest pain.       Harlon Flor, NP                         Fransico Him, MD    MES/MEDQ  D:  05/22/2002  T:  05/24/2002  Job:  [id number]

## 2011-01-23 ENCOUNTER — Other Ambulatory Visit (HOSPITAL_BASED_OUTPATIENT_CLINIC_OR_DEPARTMENT_OTHER): Payer: Self-pay | Admitting: Internal Medicine

## 2011-01-23 LAB — GLUCOSE, CAPILLARY
Glucose-Capillary: 146 mg/dL — ABNORMAL HIGH (ref 70–99)
Glucose-Capillary: 224 mg/dL — ABNORMAL HIGH (ref 70–99)

## 2011-01-24 ENCOUNTER — Other Ambulatory Visit (HOSPITAL_BASED_OUTPATIENT_CLINIC_OR_DEPARTMENT_OTHER): Payer: Self-pay | Admitting: Internal Medicine

## 2011-01-24 LAB — GLUCOSE, CAPILLARY
Glucose-Capillary: 141 mg/dL — ABNORMAL HIGH (ref 70–99)
Glucose-Capillary: 159 mg/dL — ABNORMAL HIGH (ref 70–99)

## 2011-02-02 ENCOUNTER — Other Ambulatory Visit (HOSPITAL_BASED_OUTPATIENT_CLINIC_OR_DEPARTMENT_OTHER): Payer: Self-pay | Admitting: Internal Medicine

## 2011-02-03 ENCOUNTER — Encounter (HOSPITAL_BASED_OUTPATIENT_CLINIC_OR_DEPARTMENT_OTHER): Payer: 59 | Attending: Internal Medicine

## 2011-02-03 ENCOUNTER — Other Ambulatory Visit (HOSPITAL_BASED_OUTPATIENT_CLINIC_OR_DEPARTMENT_OTHER): Payer: Self-pay | Admitting: Internal Medicine

## 2011-02-03 DIAGNOSIS — C01 Malignant neoplasm of base of tongue: Secondary | ICD-10-CM | POA: Insufficient documentation

## 2011-02-03 DIAGNOSIS — Z79899 Other long term (current) drug therapy: Secondary | ICD-10-CM | POA: Insufficient documentation

## 2011-02-03 DIAGNOSIS — E119 Type 2 diabetes mellitus without complications: Secondary | ICD-10-CM | POA: Insufficient documentation

## 2011-02-03 DIAGNOSIS — Y842 Radiological procedure and radiotherapy as the cause of abnormal reaction of the patient, or of later complication, without mention of misadventure at the time of the procedure: Secondary | ICD-10-CM | POA: Insufficient documentation

## 2011-02-03 DIAGNOSIS — M278 Other specified diseases of jaws: Secondary | ICD-10-CM | POA: Insufficient documentation

## 2011-02-03 LAB — GLUCOSE, CAPILLARY: Glucose-Capillary: 225 mg/dL — ABNORMAL HIGH (ref 70–99)

## 2011-02-06 ENCOUNTER — Other Ambulatory Visit (HOSPITAL_BASED_OUTPATIENT_CLINIC_OR_DEPARTMENT_OTHER): Payer: Self-pay | Admitting: Internal Medicine

## 2011-02-07 ENCOUNTER — Other Ambulatory Visit (HOSPITAL_BASED_OUTPATIENT_CLINIC_OR_DEPARTMENT_OTHER): Payer: Self-pay | Admitting: Internal Medicine

## 2011-02-07 LAB — GLUCOSE, CAPILLARY
Glucose-Capillary: 158 mg/dL — ABNORMAL HIGH (ref 70–99)
Glucose-Capillary: 179 mg/dL — ABNORMAL HIGH (ref 70–99)

## 2011-02-08 ENCOUNTER — Other Ambulatory Visit (HOSPITAL_BASED_OUTPATIENT_CLINIC_OR_DEPARTMENT_OTHER): Payer: Self-pay | Admitting: Internal Medicine

## 2011-02-08 LAB — GLUCOSE, CAPILLARY: Glucose-Capillary: 176 mg/dL — ABNORMAL HIGH (ref 70–99)

## 2011-02-09 ENCOUNTER — Other Ambulatory Visit (HOSPITAL_BASED_OUTPATIENT_CLINIC_OR_DEPARTMENT_OTHER): Payer: Self-pay | Admitting: Internal Medicine

## 2011-02-09 LAB — GLUCOSE, CAPILLARY
Glucose-Capillary: 235 mg/dL — ABNORMAL HIGH (ref 70–99)
Glucose-Capillary: 199 mg/dL — ABNORMAL HIGH (ref 70–99)

## 2011-02-13 ENCOUNTER — Other Ambulatory Visit (HOSPITAL_BASED_OUTPATIENT_CLINIC_OR_DEPARTMENT_OTHER): Payer: Self-pay | Admitting: Internal Medicine

## 2011-02-14 ENCOUNTER — Other Ambulatory Visit (HOSPITAL_BASED_OUTPATIENT_CLINIC_OR_DEPARTMENT_OTHER): Payer: Self-pay | Admitting: Internal Medicine

## 2011-02-14 LAB — GLUCOSE, CAPILLARY: Glucose-Capillary: 208 mg/dL — ABNORMAL HIGH (ref 70–99)

## 2011-02-15 ENCOUNTER — Other Ambulatory Visit (HOSPITAL_BASED_OUTPATIENT_CLINIC_OR_DEPARTMENT_OTHER): Payer: Self-pay | Admitting: Internal Medicine

## 2011-02-15 LAB — GLUCOSE, CAPILLARY: Glucose-Capillary: 152 mg/dL — ABNORMAL HIGH (ref 70–99)

## 2011-02-16 ENCOUNTER — Other Ambulatory Visit (HOSPITAL_BASED_OUTPATIENT_CLINIC_OR_DEPARTMENT_OTHER): Payer: Self-pay | Admitting: Internal Medicine

## 2011-02-16 LAB — GLUCOSE, CAPILLARY: Glucose-Capillary: 167 mg/dL — ABNORMAL HIGH (ref 70–99)

## 2011-03-10 ENCOUNTER — Encounter (HOSPITAL_BASED_OUTPATIENT_CLINIC_OR_DEPARTMENT_OTHER): Payer: 59 | Attending: Internal Medicine

## 2011-03-10 DIAGNOSIS — M278 Other specified diseases of jaws: Secondary | ICD-10-CM | POA: Insufficient documentation

## 2011-03-10 DIAGNOSIS — Y842 Radiological procedure and radiotherapy as the cause of abnormal reaction of the patient, or of later complication, without mention of misadventure at the time of the procedure: Secondary | ICD-10-CM | POA: Insufficient documentation

## 2011-03-10 DIAGNOSIS — Z79899 Other long term (current) drug therapy: Secondary | ICD-10-CM | POA: Insufficient documentation

## 2011-03-10 DIAGNOSIS — C01 Malignant neoplasm of base of tongue: Secondary | ICD-10-CM | POA: Insufficient documentation

## 2011-03-10 DIAGNOSIS — E119 Type 2 diabetes mellitus without complications: Secondary | ICD-10-CM | POA: Insufficient documentation

## 2011-03-20 ENCOUNTER — Other Ambulatory Visit (HOSPITAL_COMMUNITY): Payer: Self-pay | Admitting: Oncology

## 2011-03-20 ENCOUNTER — Encounter (HOSPITAL_BASED_OUTPATIENT_CLINIC_OR_DEPARTMENT_OTHER): Payer: 59 | Admitting: Oncology

## 2011-03-20 DIAGNOSIS — Z86718 Personal history of other venous thrombosis and embolism: Secondary | ICD-10-CM

## 2011-03-20 DIAGNOSIS — Z7901 Long term (current) use of anticoagulants: Secondary | ICD-10-CM

## 2011-03-20 DIAGNOSIS — C01 Malignant neoplasm of base of tongue: Secondary | ICD-10-CM

## 2011-03-20 LAB — CBC WITH DIFFERENTIAL/PLATELET
BASO%: 0.2 % (ref 0.0–2.0)
EOS%: 0.9 % (ref 0.0–7.0)
HCT: 41.3 % (ref 38.4–49.9)
LYMPH%: 12.7 % — ABNORMAL LOW (ref 14.0–49.0)
MCH: 29.9 pg (ref 27.2–33.4)
MCHC: 34.2 g/dL (ref 32.0–36.0)
MONO#: 0.5 10*3/uL (ref 0.1–0.9)
NEUT%: 78.5 % — ABNORMAL HIGH (ref 39.0–75.0)
RBC: 4.71 10*6/uL (ref 4.20–5.82)
WBC: 6.7 10*3/uL (ref 4.0–10.3)
lymph#: 0.9 10*3/uL (ref 0.9–3.3)

## 2011-03-20 LAB — COMPREHENSIVE METABOLIC PANEL
ALT: 26 U/L (ref 0–53)
AST: 21 U/L (ref 0–37)
CO2: 28 mEq/L (ref 19–32)
Chloride: 104 mEq/L (ref 96–112)
Creatinine, Ser: 1.18 mg/dL (ref 0.50–1.35)
Sodium: 139 mEq/L (ref 135–145)
Total Bilirubin: 0.5 mg/dL (ref 0.3–1.2)
Total Protein: 7.4 g/dL (ref 6.0–8.3)

## 2011-05-26 LAB — I-STAT 8, (EC8 V) (CONVERTED LAB)
BUN: 16
Chloride: 104
Glucose, Bld: 286 — ABNORMAL HIGH
Hemoglobin: 16
Potassium: 5.1
Sodium: 135
TCO2: 27

## 2011-05-26 LAB — BASIC METABOLIC PANEL
CO2: 27
Calcium: 9.4
Chloride: 96
GFR calc Af Amer: >60
Potassium: 6.6
Sodium: 132 — ABNORMAL LOW

## 2011-06-05 LAB — CBC
MCHC: 33.5
Platelets: 216
RDW: 13.7

## 2011-06-05 LAB — HEPARIN LEVEL (UNFRACTIONATED): Heparin Unfractionated: 0.69

## 2011-06-05 LAB — PROTIME-INR: Prothrombin Time: 21.5 — ABNORMAL HIGH

## 2011-06-07 LAB — GLUCOSE, CAPILLARY
Glucose-Capillary: 113 — ABNORMAL HIGH
Glucose-Capillary: 120 — ABNORMAL HIGH
Glucose-Capillary: 73
Glucose-Capillary: 91
Glucose-Capillary: 93
Glucose-Capillary: 94

## 2011-06-07 LAB — COMPREHENSIVE METABOLIC PANEL
AST: 23
Albumin: 3.4 — ABNORMAL LOW
BUN: 19
Calcium: 9.4
Creatinine, Ser: 0.94
GFR calc Af Amer: >60
GFR calc non Af Amer: >60

## 2011-06-07 LAB — HEPARIN LEVEL (UNFRACTIONATED)
Heparin Unfractionated: 0.51
Heparin Unfractionated: 0.58
Heparin Unfractionated: 0.77 — ABNORMAL HIGH

## 2011-06-07 LAB — CBC
HCT: 29.1 — ABNORMAL LOW
HCT: 32 — ABNORMAL LOW
Hemoglobin: 11 — ABNORMAL LOW
MCHC: 33.8
MCHC: 34.3
MCV: 90.2
MCV: 91.7
Platelets: 184
Platelets: 196
Platelets: 237
RDW: 13.3
RDW: 13.4
WBC: 3.5 — ABNORMAL LOW

## 2011-06-07 LAB — PROTIME-INR
INR: 1.2
Prothrombin Time: 14.7
Prothrombin Time: 15.2
Prothrombin Time: 16.2 — ABNORMAL HIGH

## 2011-06-07 LAB — DIFFERENTIAL
Basophils Absolute: 0
Eosinophils Absolute: 0.1
Eosinophils Relative: 1
Eosinophils Relative: 4
Lymphocytes Relative: 6 — ABNORMAL LOW
Lymphs Abs: 0.4 — ABNORMAL LOW
Monocytes Absolute: 0.4
Monocytes Absolute: 0.5
Neutro Abs: 5.9

## 2011-06-07 LAB — APTT: aPTT: 40 — ABNORMAL HIGH

## 2011-06-08 LAB — GLUCOSE, CAPILLARY: Glucose-Capillary: 123 mg/dL — ABNORMAL HIGH (ref 70–99)

## 2011-07-20 ENCOUNTER — Ambulatory Visit (HOSPITAL_BASED_OUTPATIENT_CLINIC_OR_DEPARTMENT_OTHER): Payer: 59 | Admitting: Physician Assistant

## 2011-07-20 ENCOUNTER — Telehealth: Payer: Self-pay | Admitting: Oncology

## 2011-07-20 ENCOUNTER — Other Ambulatory Visit (HOSPITAL_COMMUNITY): Payer: Self-pay | Admitting: Oncology

## 2011-07-20 ENCOUNTER — Other Ambulatory Visit (HOSPITAL_BASED_OUTPATIENT_CLINIC_OR_DEPARTMENT_OTHER): Payer: 59

## 2011-07-20 VITALS — BP 127/79 | HR 75 | Temp 97.0°F | Ht 75.0 in | Wt 252.6 lb

## 2011-07-20 DIAGNOSIS — C01 Malignant neoplasm of base of tongue: Secondary | ICD-10-CM | POA: Insufficient documentation

## 2011-07-20 DIAGNOSIS — I82409 Acute embolism and thrombosis of unspecified deep veins of unspecified lower extremity: Secondary | ICD-10-CM | POA: Insufficient documentation

## 2011-07-20 DIAGNOSIS — C44621 Squamous cell carcinoma of skin of unspecified upper limb, including shoulder: Secondary | ICD-10-CM

## 2011-07-20 DIAGNOSIS — I82819 Embolism and thrombosis of superficial veins of unspecified lower extremities: Secondary | ICD-10-CM

## 2011-07-20 DIAGNOSIS — I2699 Other pulmonary embolism without acute cor pulmonale: Secondary | ICD-10-CM

## 2011-07-20 LAB — CBC WITH DIFFERENTIAL/PLATELET
BASO%: 0.1 % (ref 0.0–2.0)
EOS%: 1.7 % (ref 0.0–7.0)
MCH: 29 pg (ref 27.2–33.4)
MCHC: 34 g/dL (ref 32.0–36.0)
MONO#: 0.4 10*3/uL (ref 0.1–0.9)
NEUT%: 80.2 % — ABNORMAL HIGH (ref 39.0–75.0)
RBC: 5.21 10*6/uL (ref 4.20–5.82)
RDW: 13.4 % (ref 11.0–14.6)
WBC: 6.5 10*3/uL (ref 4.0–10.3)
lymph#: 0.7 10*3/uL — ABNORMAL LOW (ref 0.9–3.3)

## 2011-07-20 LAB — COMPREHENSIVE METABOLIC PANEL
ALT: 28 U/L (ref 0–53)
AST: 22 U/L (ref 0–37)
CO2: 27 mEq/L (ref 19–32)
Calcium: 9.7 mg/dL (ref 8.4–10.5)
Chloride: 101 mEq/L (ref 96–112)
Creatinine, Ser: 1.28 mg/dL (ref 0.50–1.35)
Potassium: 4.8 mEq/L (ref 3.5–5.3)
Sodium: 137 mEq/L (ref 135–145)
Total Protein: 7.1 g/dL (ref 6.0–8.3)

## 2011-07-20 NOTE — Progress Notes (Signed)
CC:   [name], Ph.D., M.D. [name]. [name], M.D. [name]) [name], M.D. [name], D.D.S.  INTERIM HISTORY:  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  Since his last clinic visit in July of 2012, he reports occasional mild fatigue, but no difficulty completing ADLs.  No fevers, chills or night sweats.  No dyspnea or cough.  He has had normal appetite and has had no issues with nausea, vomiting, constipation or diarrhea.  No dysuria.  No frequency or hematuria.  No alteration in sensation or balance or ongoing issues with swelling of extremities.  Although, he does note some lower extremity swelling if he has been standing for long periods. This is most pronounced on the left.  He is not having any problems with calf tenderness.  He states his last evaluation by Dr. Lucia Johnson was in September of 2012 and he will have another followup visit in March of 2013.  CURRENT MEDICATIONS:  Reviewed and recorded.  PHYSICAL EXAMINATION:  Vital signs:  Temperature is 97, heart rate 75, respirations 20, blood pressure 127/79, weight 252.6 pounds.  GENERAL: This is a well-developed, well-nourished white male in no acute distress.  HEENT:  Sclerae are nonicteric.  There is no thrush or mucositis.  Skin:  No rashes or lesions.  Lymph:  No cervical, supraclavicular, axillary or inguinal lymphadenopathy.  Cardiac: Regular rate and rhythm without murmurs or gallops.  Peripheral pulses are 2+.  Chest:  Lungs clear to auscultation.  Abdomen:  Positive bowel sounds.  Soft, nontender, nondistended.  No organomegaly.  Extremities: Reveal bilateral dependent lower extremity edema without cyanosis or calf tenderness.  Neuro:  Alert and oriented x3.  Strength, sensation and coordination all grossly intact.  LABS:  CBC with diff reveals white  blood count of 6.5, hemoglobin 15.1, hematocrit 44.5, platelets of 188, ANC of 5.2, and MCV of 85.4,  CMET, LDH and TSH level are currently pending.  IMPRESSION/PLAN: 1. [name] is a 55 year old white male with squamous cell     carcinoma involving the base of the tongue as well as history of     extensive left lower extremity deep venous thrombosis associated     with pulmonary emboli diagnosed September 2009 with squamous cell     carcinoma diagnosed in February 2009.  He received chemotherapy     with cisplatin, Taxotere and 5-FU between March 12 and Jan 15, 2008, as well as external radiation therapy between May 26 and March 11, 2008.  He has had no evidence of disease recurrence since that     time with last CTs of the neck and chest from October 11, 2010,     with no evidence of recurrent cancer in those studies. 2. Per Dr. Ralene Johnson, the patient will be scheduled for followup visit     in 4 months' time, at which time we will reassess CBC with diff,     CMET, LDH, TSH level, and chest x-ray PA and lateral.  The patient     is advised to call in the interim if any questions or problems. 3. For patient's history of deep venous thrombosis and pulmonary     emboli, he was anticoagulated with Coumadin.  He has been off     anticoagulation since November of 2010.  ______________________________ Clarence Mori, MSN, ANP, BC RJ/MEDQ  D:  07/20/2011  T:  07/20/2011  Job:  [id number]

## 2011-07-20 NOTE — Telephone Encounter (Signed)
gve the pt his march 2013 appt calendar along with the cxr appt same day

## 2011-07-20 NOTE — Progress Notes (Signed)
This office note has been dictated.

## 2011-08-11 ENCOUNTER — Encounter: Payer: Self-pay | Admitting: *Deleted

## 2011-08-11 DIAGNOSIS — I82409 Acute embolism and thrombosis of unspecified deep veins of unspecified lower extremity: Secondary | ICD-10-CM | POA: Insufficient documentation

## 2011-08-11 DIAGNOSIS — M272 Inflammatory conditions of jaws: Secondary | ICD-10-CM | POA: Insufficient documentation

## 2011-08-11 DIAGNOSIS — F329 Major depressive disorder, single episode, unspecified: Secondary | ICD-10-CM | POA: Insufficient documentation

## 2011-08-11 DIAGNOSIS — F32A Depression, unspecified: Secondary | ICD-10-CM | POA: Insufficient documentation

## 2011-08-11 DIAGNOSIS — Y842 Radiological procedure and radiotherapy as the cause of abnormal reaction of the patient, or of later complication, without mention of misadventure at the time of the procedure: Secondary | ICD-10-CM | POA: Insufficient documentation

## 2011-08-11 DIAGNOSIS — C801 Malignant (primary) neoplasm, unspecified: Secondary | ICD-10-CM | POA: Insufficient documentation

## 2011-08-11 DIAGNOSIS — Z9862 Peripheral vascular angioplasty status: Secondary | ICD-10-CM | POA: Insufficient documentation

## 2011-08-11 NOTE — Progress Notes (Signed)
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

## 2011-08-14 ENCOUNTER — Ambulatory Visit
Admission: RE | Admit: 2011-08-14 | Discharge: 2011-08-14 | Disposition: A | Discharge status: Home / Self Care | Payer: 59 | Source: Ambulatory Visit | Attending: Radiation Oncology | Admitting: Radiation Oncology

## 2011-08-14 ENCOUNTER — Encounter: Payer: Self-pay | Admitting: Radiation Oncology

## 2011-08-14 VITALS — BP 109/68 | HR 79 | Temp 97.4°F | Resp 18 | Wt 256.2 lb

## 2011-08-14 DIAGNOSIS — C01 Malignant neoplasm of base of tongue: Secondary | ICD-10-CM

## 2011-08-14 NOTE — Progress Notes (Signed)
Patient presents to the clinic today unaccompanied for a follow up appointment with Dr. [name]. Patient is alert and oriented to person, place, and time. No distress noted. Steady gait noted. Pleasant affect noted. Patient denies pain. Patient reports a good appetite and that he is eating without difficulty. Patient denies taste changes, cough, nausea or vomiting. Patient denies diarrhea or constipation. Patient denies headaches or dizziness. Patient has no complaints.

## 2011-08-14 NOTE — Progress Notes (Signed)
CC:   [name], M.D. [name]. [name], M.D. [name], D.D.S.  DIAGNOSIS:  Stage IVA base of tongue carcinoma.  INTERVAL SINCE RADIATION THERAPY:  3-1/2 years.  NARRATIVE:  [name] comes in today for routine followup.  The interval history is significant for the patient undergoing several extractions at [facility].  The patient did receive preoperative hyperbaric oxygen as well as a week of hyperbaric oxygen afterwards. Overall, the patient is well-pleased with this surgery and according to the patient, his oral surgeon is also quite pleased with the results. The patient will wait approximately a year to have dentures placed.  The patient denies any odynophagia or dysphagia at this point.  He has some chronic xerostomia.  Food dose taste well for the patient.  The patient continues to followup with Dr. Lucia Johnson and [name].  The patient does have his TSH checked through [name] office.  The patient, in addition, will get a chest x-ray early next year through [name] office.  PHYSICAL EXAMINATION:  Vital Signs:  The patient's weight is 256 pounds, which is down approximately 12 pounds since his weighing in May 2012. Examination of the lungs:  Reveals them to be clear.  Heart:  Regular rhythm and rate.  Examination of the neck and supraclavicular region: Reveals no palpable adenopathy.  The patient has induration along the neck and thickening as noted on previous exams.  HEENT:  The nasal and oral cavity and posterior pharynx reveals several teeth missing along the posterior maxilla and mandible region.  The patient's gums appear well-healed at this time and healthy.  Examination of the oral cavity and oral tongue as well as the base of the tongue reveals no suspicious lesions.  A indirect mirror examination was performed.  There are no lesions noted along the base of tongue or epiglottis region.  The vocal cords move well on exam.  I was  unable to view the entire extent of the course, however.  Palpation along the base of tongue and pharyngeal areas reveals no suspicious induration.  IMPRESSION AND PLAN:  Clinically NED.  The patient will see Dr. Lucia Johnson in March and the patient is scheduled for followup in Radiation Oncology in June of 2013.    ______________________________ [name], Ph.D., M.D. JDK/MEDQ  D:  08/14/2011  T:  08/14/2011  Job:  [id number]

## 2011-11-17 ENCOUNTER — Other Ambulatory Visit (HOSPITAL_BASED_OUTPATIENT_CLINIC_OR_DEPARTMENT_OTHER): Payer: 59 | Admitting: Lab

## 2011-11-17 ENCOUNTER — Ambulatory Visit (HOSPITAL_BASED_OUTPATIENT_CLINIC_OR_DEPARTMENT_OTHER): Payer: 59 | Admitting: Oncology

## 2011-11-17 ENCOUNTER — Telehealth: Payer: Self-pay | Admitting: Oncology

## 2011-11-17 ENCOUNTER — Ambulatory Visit (HOSPITAL_COMMUNITY)
Admission: RE | Admit: 2011-11-17 | Discharge: 2011-11-17 | Disposition: A | Discharge status: Home / Self Care | Payer: 59 | Source: Ambulatory Visit | Attending: Physician Assistant | Admitting: Physician Assistant

## 2011-11-17 ENCOUNTER — Encounter: Payer: Self-pay | Admitting: Oncology

## 2011-11-17 ENCOUNTER — Encounter: Payer: Self-pay | Admitting: Medical Oncology

## 2011-11-17 VITALS — BP 121/79 | HR 79 | Temp 96.8°F | Ht 75.0 in | Wt 253.7 lb

## 2011-11-17 DIAGNOSIS — C01 Malignant neoplasm of base of tongue: Secondary | ICD-10-CM

## 2011-11-17 DIAGNOSIS — Z923 Personal history of irradiation: Secondary | ICD-10-CM

## 2011-11-17 DIAGNOSIS — Z9221 Personal history of antineoplastic chemotherapy: Secondary | ICD-10-CM

## 2011-11-17 DIAGNOSIS — E119 Type 2 diabetes mellitus without complications: Secondary | ICD-10-CM | POA: Insufficient documentation

## 2011-11-17 DIAGNOSIS — Z85810 Personal history of malignant neoplasm of tongue: Secondary | ICD-10-CM

## 2011-11-17 LAB — COMPREHENSIVE METABOLIC PANEL
Albumin: 4.4 g/dL (ref 3.5–5.2)
Alkaline Phosphatase: 97 U/L (ref 39–117)
BUN: 25 mg/dL — ABNORMAL HIGH (ref 6–23)
Calcium: 9.2 mg/dL (ref 8.4–10.5)
Creatinine, Ser: 1.23 mg/dL (ref 0.50–1.35)
Glucose, Bld: 147 mg/dL — ABNORMAL HIGH (ref 70–99)
Potassium: 4.3 mEq/L (ref 3.5–5.3)

## 2011-11-17 LAB — CBC WITH DIFFERENTIAL/PLATELET
Basophils Absolute: 0 10*3/uL (ref 0.0–0.1)
Eosinophils Absolute: 0.1 10*3/uL (ref 0.0–0.5)
HCT: 44.1 % (ref 38.4–49.9)
HGB: 14.5 g/dL (ref 13.0–17.1)
MCV: 87.2 fL (ref 79.3–98.0)
NEUT#: 4.3 10*3/uL (ref 1.5–6.5)
NEUT%: 75.2 % — ABNORMAL HIGH (ref 39.0–75.0)
RDW: 13.5 % (ref 11.0–14.6)
lymph#: 0.8 10*3/uL — ABNORMAL LOW (ref 0.9–3.3)

## 2011-11-17 LAB — TSH: TSH: 5.12 u[IU]/mL — ABNORMAL HIGH (ref 0.350–4.500)

## 2011-11-17 NOTE — Telephone Encounter (Signed)
Gv pt appt for sept2013 

## 2011-11-17 NOTE — Progress Notes (Signed)
This office note has been dictated.  [id number]

## 2011-11-17 NOTE — Progress Notes (Signed)
CC:   [name], Ph.D., M.D. [name]. [name], M.D. Delmar Landau) Harrington Challenger, M.D. [name], D.D.S.  PROBLEM LIST: 1. 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  The patient received     cisplatin, Taxotere and 5-FU by continuous infusion for 4 cycles     from 11/14/2007 through 01/15/2008.  He then received IMRT with     weekly carboplatin from 01/28/2008 through 03/11/2008.  The patient     has remained disease free. 2. 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     The patient received Coumadin until the fall of 2010.     Hypercoagulation panel was negative, although the patient's father     apparently also had DVT. 3. History of osteoradionecrosis involving the left lower mandible in     March of 2012.  The patient underwent dental extractions at [facility] on     01/31/2011.  He received hyperbaric oxygen 40 treatments from late     March 2012 through mid June 2012. 4. Dyslipidemia. 5. Diabetes mellitus.  MEDICATIONS: 1. Vytorin 10/20 one tablet at bedtime. 2. Amaryl 4 mg daily. 3. Multivitamins 1 daily. 4. Onglyza 5 mg daily.  HISTORY:  [name] is followed him for his squamous cell carcinoma involving the base of the tongue stage IVA status post chemotherapy and radiation treatments.  His diagnosis goes back to February 2009, thus he is now out 4 years without recurrence.  He saw Dr. Radene Journey last week and everything was fine.  The patient is without any complaints, feels entirely well.  He denies any dry mouth pain, dysphagia or any other difficulty chewing or swallowing.  His weight is well-maintained. The patient is without symptoms to suggest recurrent disease.  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  He was last seen by [place] on 07/20/11.  PHYSICAL EXAM:  He looks well and is in good spirits.  Weight is 253.7 pounds, height 75 inches, blood pressure  121/79.  Other vital signs are normal.  There is no scleral icterus.  Mouth and pharynx are benign.  I do not see any pathology at all.  The lower left teeth have been extracted.  Neck is a little firm, but the radiation changes are relatively mild.  There is no adenopathy.  Heart and lungs are normal. Abdomen is benign.  No organomegaly or masses.  Extremities:  No peripheral edema.  He has had stasis changes in the legs that were noted.  No clubbing.  He has multiple seborrheic keratoses over the back.  I believe he has some developing over the face.  I have urged him to follow up closely with his dermatologist.  LABORATORY DATA:  Today, white count 5.8, ANC 4.3, hemoglobin 14.5, hematocrit 44.1, platelets 176,000.  Chemistries today are pending. Chemistries from 07/20/2011 were normal except for a glucose of 165.  IMAGING STUDIES: 1. CT scan of the chest with IV contrast from 10/11/2010 showed 2 tiny     subpleural nodules identified along the left major fissure on     images 24-25, were unchanged compared with the exam of March 2009.     There are no new pulmonary lesions, and the CT is stable without     evidence for metastatic disease. 2. CT scan of neck with IV contrast on 10/11/2010 showed stable     appearance of  the neck without evidence for residual or recurrent     malignancy.  There is some mild degenerative change in the upper     cervical spine which is stable. 3. Chest x-ray, 2 view, from 11/17/2011 is stable with no acute     findings.  IMPRESSION AND PLAN:  Mr. [name] continues to do extremely well now 4 years from the time of diagnosis.  I should mention that his last TSH was on November 15th and it was 3.655.  TSH is pending today.  We will plan to see Mr. [name] again in 6 months at which time will check CBC, chemistries, and TSH.  At this point, I do not think we need to continue with CT scans.  I probably would favor chest x-ray every year.  The patient's  last colonoscopy was about 7 or 8 years ago, and he will probably be due for another colonoscopy in a couple years as his age is now 38.    ______________________________ Jeanie Cooks, M.D. DSM/MEDQ  D:  11/17/2011  T:  11/17/2011  Job:  [id number]

## 2011-11-19 ENCOUNTER — Other Ambulatory Visit: Payer: Self-pay | Admitting: Oncology

## 2011-11-19 ENCOUNTER — Encounter: Payer: Self-pay | Admitting: Oncology

## 2011-11-19 DIAGNOSIS — C01 Malignant neoplasm of base of tongue: Secondary | ICD-10-CM

## 2011-11-19 DIAGNOSIS — R7989 Other specified abnormal findings of blood chemistry: Secondary | ICD-10-CM

## 2011-11-19 NOTE — Progress Notes (Signed)
To repeat TSH with other thyroid hormone levels in about 1 month.  TSH from 3/15 was 5.120  (.350-4.500).

## 2011-11-20 ENCOUNTER — Telehealth: Payer: Self-pay | Admitting: Oncology

## 2011-11-20 ENCOUNTER — Encounter: Payer: Self-pay | Admitting: Medical Oncology

## 2011-11-20 ENCOUNTER — Other Ambulatory Visit: Payer: Self-pay | Admitting: Medical Oncology

## 2011-11-20 NOTE — Progress Notes (Signed)
I spoke with pt to let him know that his TSH was elevated. Dr. Ralene Ok would like to recheck his level in a month. He voiced understanding.

## 2011-11-20 NOTE — Telephone Encounter (Signed)
spoke with the pt and he is aware of his appt but req to speak with the nusre to see what appts are for./need?    aom

## 2011-11-24 ENCOUNTER — Ambulatory Visit: Payer: 59 | Admitting: Oncology

## 2011-12-20 ENCOUNTER — Other Ambulatory Visit: Payer: 59 | Admitting: Lab

## 2011-12-20 ENCOUNTER — Telehealth: Payer: Self-pay | Admitting: Medical Oncology

## 2011-12-20 DIAGNOSIS — R7989 Other specified abnormal findings of blood chemistry: Secondary | ICD-10-CM

## 2011-12-20 DIAGNOSIS — C01 Malignant neoplasm of base of tongue: Secondary | ICD-10-CM

## 2011-12-20 LAB — TSH: TSH: 4.551 u[IU]/mL — ABNORMAL HIGH (ref 0.350–4.500)

## 2011-12-20 LAB — T4, FREE: Free T4: 0.86 ng/dL (ref 0.80–1.80)

## 2011-12-20 LAB — T4: T4, Total: 7.3 ug/dL (ref 5.0–12.5)

## 2011-12-20 NOTE — Telephone Encounter (Signed)
I called pt with TSH results. I will fax to Dr. Harrington Challenger

## 2012-02-05 ENCOUNTER — Encounter: Payer: Self-pay | Admitting: Radiation Oncology

## 2012-02-05 ENCOUNTER — Ambulatory Visit
Admission: RE | Admit: 2012-02-05 | Discharge: 2012-02-05 | Disposition: A | Discharge status: Home / Self Care | Payer: 59 | Source: Ambulatory Visit | Attending: Radiation Oncology | Admitting: Radiation Oncology

## 2012-02-05 VITALS — BP 118/72 | HR 82 | Temp 97.8°F | Resp 18 | Wt 247.0 lb

## 2012-02-05 DIAGNOSIS — C01 Malignant neoplasm of base of tongue: Secondary | ICD-10-CM

## 2012-02-05 NOTE — Progress Notes (Signed)
HERE TODAY FOR FU OF BASE OF TONGUE CA.  SKIN LOOKS GOOD.  NO C/O .Marland KitchenMarland KitchenCHEWING AND SWALLOWING OK, STILL NEEDS TO GET TEETH REPLACED

## 2012-02-05 NOTE — Progress Notes (Signed)
  Radiation Oncology         [phone] ________________________________  Name: [name] MRN: [id number]  Date: 02/05/2012  DOB: [birth year]  Follow-Up Visit Note  CC: No primary provider on file.  Melony Overly E, *  Diagnosis:   Stage IV-A base of tongue carcinoma  Interval Since Last Radiation:  47 months  Narrative:  The patient returns today for routine follow-up.  He is doing well without complaints. He specifically denies any otalgia odynophagia or dysphagia or  [place]. food does taste well at this time. He has been started on Synthroid for hypothyroidism presumably related to his radiation therapy.                              ALLERGIES:   has no known allergies.  Meds: Current Outpatient Prescriptions  Medication Sig Dispense Refill  . levothyroxine (SYNTHROID, LEVOTHROID) 100 MCG tablet Take 50 mcg by mouth daily.      [name] ezetimibe-simvastatin (VYTORIN) 10-20 MG per tablet Take 1 tablet by mouth at bedtime.        [name] glimepiride (AMARYL) 4 MG tablet Take 4 mg by mouth daily before breakfast.        . Multiple Vitamin (MULTIVITAMIN) tablet Take 1 tablet by mouth daily.        . saxagliptin HCl (ONGLYZA) 2.5 MG TABS tablet Take 5 mg by mouth daily.          Physical Findings: The patient is in no acute distress. Patient is alert and oriented.  weight is 247 lb (112.038 kg). His oral temperature is 97.8 F (36.6 C). His blood pressure is 118/72 and his pulse is 82. His respiration is 18. .  No no palpable adenopathy in the neck supraclavicular or axillary areas patient does have induration along the anterior neck is noted on previous exams. The oral cavity is free of any mucosal lesions. A indirect examination is performed. There are no mucosal lesions noted along the base of tongue region oropharyngeal areas.  The patient on the base of tongue area reveals no suspicious induration.  Lab Findings: Lab Results  Component Value Date   WBC 5.8 11/17/2011   HGB 14.5  11/17/2011   HCT 44.1 11/17/2011   MCV 87.2 11/17/2011   PLT 176 11/17/2011    @LASTCHEM @  Radiographic Findings: No results found.  Impression:  The patient is recovering from the effects of radiation.  No evidence of recurrence almost 4 years out from radiation therapy.  Plan:  Routine followup in 6 months.  _____________________________________    Blair Promise, PhD, MD

## 2012-02-12 ENCOUNTER — Ambulatory Visit: Payer: 59 | Admitting: Radiation Oncology

## 2012-05-14 ENCOUNTER — Encounter: Payer: Self-pay | Admitting: Oncology

## 2012-05-16 ENCOUNTER — Telehealth: Payer: Self-pay | Admitting: Oncology

## 2012-05-16 NOTE — Telephone Encounter (Signed)
Called pt appt was r/s from 9/13 to 9/17 per MD, left message

## 2012-05-17 ENCOUNTER — Ambulatory Visit: Payer: 59 | Admitting: Oncology

## 2012-05-17 ENCOUNTER — Other Ambulatory Visit: Payer: 59 | Admitting: Lab

## 2012-05-21 ENCOUNTER — Ambulatory Visit (HOSPITAL_BASED_OUTPATIENT_CLINIC_OR_DEPARTMENT_OTHER): Payer: 59 | Admitting: Oncology

## 2012-05-21 ENCOUNTER — Encounter: Payer: Self-pay | Admitting: Oncology

## 2012-05-21 ENCOUNTER — Other Ambulatory Visit (HOSPITAL_BASED_OUTPATIENT_CLINIC_OR_DEPARTMENT_OTHER): Payer: 59 | Admitting: Lab

## 2012-05-21 VITALS — BP 118/74 | HR 81 | Temp 97.5°F | Resp 20 | Ht 75.0 in | Wt 246.7 lb

## 2012-05-21 DIAGNOSIS — C01 Malignant neoplasm of base of tongue: Secondary | ICD-10-CM

## 2012-05-21 DIAGNOSIS — B977 Papillomavirus as the cause of diseases classified elsewhere: Secondary | ICD-10-CM

## 2012-05-21 LAB — CBC WITH DIFFERENTIAL/PLATELET
BASO%: 0.6 % (ref 0.0–2.0)
LYMPH%: 14.1 % (ref 14.0–49.0)
MCHC: 33.4 g/dL (ref 32.0–36.0)
MONO#: 0.5 10*3/uL (ref 0.1–0.9)
RBC: 5.12 10*6/uL (ref 4.20–5.82)
RDW: 13 % (ref 11.0–14.6)
WBC: 6 10*3/uL (ref 4.0–10.3)
lymph#: 0.8 10*3/uL — ABNORMAL LOW (ref 0.9–3.3)

## 2012-05-21 LAB — COMPREHENSIVE METABOLIC PANEL (CC13)
Alkaline Phosphatase: 98 U/L (ref 40–150)
CO2: 26 mEq/L (ref 22–29)
Creatinine: 1.3 mg/dL (ref 0.7–1.3)
Glucose: 219 mg/dl — ABNORMAL HIGH (ref 70–99)
Total Bilirubin: 0.8 mg/dL (ref 0.20–1.20)

## 2012-05-21 LAB — LACTATE DEHYDROGENASE (CC13): LDH: 172 U/L (ref 125–220)

## 2012-05-21 NOTE — Progress Notes (Signed)
CC:   [name], Ph.D., M.D. [name]. [name], M.D. [name]) [name], M.D. [name], D.D.S.  PROBLEM LIST:  1. 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 The patient received cisplatin, Taxotere and 5-FU by continuous infusion for 4 cycles  from 11/14/2007 through 01/15/2008. He then received IMRT with  weekly carboplatin from 01/28/2008 through 03/11/2008. The patient  has remained disease free.  2. 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  The patient received Coumadin until the fall of 2010.  Hypercoagulation panel was negative, although the patient's father  apparently also had DVT.  3. 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 The patient underwent dental extractions at [facility] on  01/31/2011. He received hyperbaric oxygen 40 treatments from late  March 2012 through mid June 2012.  4. Dyslipidemia.  5. Diabetes mellitus.    MEDICATIONS:  1. Vytorin 10/20 one tablet at bedtime.  2. Amaryl 4 mg daily.  3. Multivitamins 1 daily.  4. Onglyza 5 mg daily. 5. Levothyroxine 50 mcg daily.  SMOKING HISTORY:  The patient has never smoked cigarettes.   HISTORY:  I saw [name] today for follow up for squamous cell carcinoma involving the base of the tongue, stage IVA, diagnosed in February 2009.  The patient was treated with chemotherapy and radiation. He continues to do well without any signs of her recurrence.  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  He continues to be followed by Dr. Lucia Johnson whom he saw last week.  He says he is due to see Dr. Sondra Johnson in December.  The patient feels fine.  Denies any oral or general symptomatology to suggest recurrent disease.  The patient was last seen by [place] on 11/17/2011.  PHYSICAL EXAMINATION:  General:  He continues to look well.  He is trying to lose some  weight.  Vital Signs:  Weight is now 246.7 pounds, down from 259 pounds in July 2012.  Height 6 feet 3 inches.  Body surface area 2.43 sq m.  Blood pressure 118/74.  Other vital signs are normal.  HEENT:  There is no scleral icterus.  Mouth and pharynx are benign.  No obvious pathology.  He has had some dental extractions. Neck:  Remains a little firm with radiation changes but no evidence for recurrent tumor.  Heart/lungs:  Normal.  Abdomen:  Benign with no organomegaly or masses palpable.  He has lots of seborrheic keratoses over the trunk, particularly the back.  Extremities:  No peripheral edema.  He has some stasis changes over the legs that were noted previously.  Neurologic:  Nonfocal.  LABORATORY DATA:  Today, white count 6.0, ANC 4.6, hemoglobin 14.8, hematocrit 44.4, platelets 182,000.  Chemistries today were normal except for a glucose of 219.  The patient does have a diagnosis of diabetes.  TSH today is pending.  On 11/17/2011 the TSH was 5.12 and on 12/20/2011 TSH was 4.551 with normal being 0.350 to 4.500.  IMAGING STUDIES:  1. CT scan of the chest with IV contrast from 10/11/2010 showed 2 tiny  subpleural nodules identified along the left major fissure on  images 24-25, were unchanged compared with the exam of March 2009.  There are no new pulmonary lesions, and the CT is stable without  evidence for metastatic disease.  2. CT scan of neck with IV contrast on 10/11/2010  showed stable  appearance of the neck without evidence for residual or recurrent  malignancy. There is some mild degenerative change in the upper  cervical spine which is stable.  3. Chest x-ray, 2 view, from 11/17/2011 is stable with no acute  findings.   IMPRESSION AND PLAN:  [name] continues to do well, now over 4-1/2 years from the time of diagnosis.  Since his last visit, he was placed on a levothyroxine.  The patient is up-to-date with his chest x-ray which he had 6 months ago.  His last  colonoscopy was at age 50 and apparently will be repeated in the next couple years when he turns 6. The patient is hoping to have some dental work to replace the teeth for which he underwent extractions.  We will plan to see the patient again in 6 months at which time we will check CBC and chemistries.  Dr. Harrington Johnson is following thyroid functions so I will not order another TSH.    ______________________________ [name], M.D. DSM/MEDQ  D:  05/21/2012  T:  05/21/2012  Job:  [id number]

## 2012-05-21 NOTE — Progress Notes (Signed)
This office note has been dictated.  [id number]

## 2012-05-22 ENCOUNTER — Telehealth: Payer: Self-pay

## 2012-05-22 NOTE — Telephone Encounter (Signed)
Faxed TSH from 05/21/12 to Dr Harrington Challenger per [place]

## 2012-05-24 ENCOUNTER — Telehealth: Payer: Self-pay | Admitting: Oncology

## 2012-05-24 NOTE — Telephone Encounter (Signed)
lvm for pt advising on march appt.....mailed march appt schedule to pt....sed

## 2012-08-05 ENCOUNTER — Ambulatory Visit: Payer: 59 | Admitting: Radiation Oncology

## 2012-08-05 ENCOUNTER — Ambulatory Visit
Admission: RE | Admit: 2012-08-05 | Discharge: 2012-08-05 | Disposition: A | Discharge status: Home / Self Care | Payer: 59 | Source: Ambulatory Visit | Attending: Radiation Oncology | Admitting: Radiation Oncology

## 2012-08-05 ENCOUNTER — Encounter: Payer: Self-pay | Admitting: Radiation Oncology

## 2012-08-05 VITALS — BP 124/79 | HR 92 | Temp 97.3°F | Resp 18 | Wt 236.2 lb

## 2012-08-05 DIAGNOSIS — C01 Malignant neoplasm of base of tongue: Secondary | ICD-10-CM

## 2012-08-05 NOTE — Progress Notes (Signed)
Patient presents to the clinic today unaccompanied for follow up appointment with Dr. [name]. Patient alert and oriented to person, place, and time. No distress noted. Steady gait noted. Pleasant affect noted. Patient denies pain at this time. Patient denies hearing or vision changes. Patient reports mild dry mouth continues but, reports sipping water regularly. Patient reports intended weight loss. Patient reports eating small frequent meals and exercising more. Patient denies difficulty or painful swallowing. Patient reports taking cutting his food in smaller portions and being more cautious. Patient denies dyspnea, SOB, or cough. PCP 12/19, ortho 1/2, taffeen (dermologist), M[place] 3/17, and newman 3/19. Reported all findings to Dr. [name].

## 2012-08-05 NOTE — Progress Notes (Signed)
  Radiation Oncology         [phone] ________________________________  Name: [name] MRN: [id number]  Date: 08/05/2012  DOB: 08/21/1956  Follow-Up Visit Note  CC: No primary provider on file.  [name] E, *  Diagnosis:   Stage IVA base of tongue carcinoma  Interval Since Last Radiation:  53 months   Narrative:  The patient returns today for routine follow-up.  He is doing well this time. He decide against obtaining dentures for his posterior maxilla and mandible region.  He has a mild xerostomia. He does taste food well.  He denies any cough or breathing problems.                             ALLERGIES:   has no known allergies.  Meds: Current Outpatient Prescriptions  Medication Sig Dispense Refill  . ezetimibe-simvastatin (VYTORIN) 10-20 MG per tablet Take 1 tablet by mouth at bedtime.        [name] levothyroxine (SYNTHROID, LEVOTHROID) 100 MCG tablet Take 50 mcg by mouth daily.      . Liraglutide (VICTOZA) 18 MG/3ML SOLN Inject into the skin.      . Multiple Vitamin (MULTIVITAMIN) tablet Take 1 tablet by mouth daily.        [name] glimepiride (AMARYL) 4 MG tablet Take 4 mg by mouth daily before breakfast.        . saxagliptin HCl (ONGLYZA) 2.5 MG TABS tablet Take 5 mg by mouth daily.          Physical Findings: The patient is in no acute distress. Patient is alert and oriented.  weight is 236 lb 3.2 oz (107.14 kg). His oral temperature is 97.3 F (36.3 C). His blood pressure is 124/79 and his pulse is 92. His respiration is 18 and oxygen saturation is 100%. .  No palpable cervical supraclavicular or axillary adenopathy. He  does have some induration along the anterior neck.  The lungs are clear to auscultation. The heart has regular rhythm and rate.  Examination of oral cavity reveals the mucosa to be moist. There is no secondary infection. Patient proceeded to undergo indirect mirror examination. There is no mucosal lesions noted along the base of tongue region. The  vocal cords moved well on exam.  Palpation along the base of tongue and pharyngeal area reveals no suspicious induration.  Lab Findings: Lab Results  Component Value Date   WBC 6.0 05/21/2012   HGB 14.8 05/21/2012   HCT 44.4 05/21/2012   MCV 86.7 05/21/2012   PLT 182 05/21/2012    @LASTCHEM @  Radiographic Findings: No results found.  Impression:  No evidence of recurrence on clinical exam today.  Plan:  When necessary followup. Patient will see Dr. [name] and Dr. Ralene Ok next spring for his 5 year anniversary from treatment.  _____________________________________   Blair Promise, PhD, MD

## 2012-10-21 ENCOUNTER — Other Ambulatory Visit: Payer: Self-pay | Admitting: Dermatology

## 2012-11-18 ENCOUNTER — Encounter: Payer: Self-pay | Admitting: Oncology

## 2012-11-18 ENCOUNTER — Other Ambulatory Visit (HOSPITAL_BASED_OUTPATIENT_CLINIC_OR_DEPARTMENT_OTHER): Payer: 59 | Admitting: Lab

## 2012-11-18 ENCOUNTER — Ambulatory Visit (HOSPITAL_COMMUNITY)
Admission: RE | Admit: 2012-11-18 | Discharge: 2012-11-18 | Disposition: A | Discharge status: Home / Self Care | Payer: 59 | Source: Ambulatory Visit | Attending: Oncology | Admitting: Oncology

## 2012-11-18 ENCOUNTER — Ambulatory Visit (HOSPITAL_BASED_OUTPATIENT_CLINIC_OR_DEPARTMENT_OTHER): Payer: 59 | Admitting: Oncology

## 2012-11-18 VITALS — BP 131/81 | HR 96 | Temp 97.1°F | Resp 20 | Ht 75.0 in | Wt 217.4 lb

## 2012-11-18 DIAGNOSIS — Z86711 Personal history of pulmonary embolism: Secondary | ICD-10-CM

## 2012-11-18 DIAGNOSIS — E119 Type 2 diabetes mellitus without complications: Secondary | ICD-10-CM

## 2012-11-18 DIAGNOSIS — C01 Malignant neoplasm of base of tongue: Secondary | ICD-10-CM

## 2012-11-18 DIAGNOSIS — Z86718 Personal history of other venous thrombosis and embolism: Secondary | ICD-10-CM

## 2012-11-18 LAB — CBC WITH DIFFERENTIAL/PLATELET
BASO%: 0.5 % (ref 0.0–2.0)
Basophils Absolute: 0 10*3/uL (ref 0.0–0.1)
Eosinophils Absolute: 0.1 10*3/uL (ref 0.0–0.5)
HCT: 47.5 % (ref 38.4–49.9)
HGB: 15.8 g/dL (ref 13.0–17.1)
MONO#: 0.4 10*3/uL (ref 0.1–0.9)
NEUT#: 4.5 10*3/uL (ref 1.5–6.5)
NEUT%: 77.6 % — ABNORMAL HIGH (ref 39.0–75.0)
WBC: 5.8 10*3/uL (ref 4.0–10.3)
lymph#: 0.8 10*3/uL — ABNORMAL LOW (ref 0.9–3.3)

## 2012-11-18 LAB — COMPREHENSIVE METABOLIC PANEL (CC13)
ALT: 23 U/L (ref 0–55)
BUN: 24.6 mg/dL (ref 7.0–26.0)
CO2: 27 mEq/L (ref 22–29)
Calcium: 9.9 mg/dL (ref 8.4–10.4)
Chloride: 98 mEq/L (ref 98–107)
Creatinine: 1.7 mg/dL — ABNORMAL HIGH (ref 0.7–1.3)
Glucose: 612 mg/dl (ref 70–99)

## 2012-11-18 LAB — LACTATE DEHYDROGENASE (CC13): LDH: 178 U/L (ref 125–245)

## 2012-11-18 NOTE — Progress Notes (Signed)
CC:   Blair Promise, Ph.D., M.D. [name]. [name], M.D. Delmar Landau) Harrington Challenger, M.D. [name], D.D.S.  PROBLEM LIST:  1. 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 The patient received cisplatin, Taxotere and 5-FU by continuous infusion for 4 cycles  from 11/14/2007 through 01/15/2008. He then received IMRT with  weekly carboplatin from 01/28/2008 through 03/11/2008. The patient  has remained disease free.  2. 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  The patient received Coumadin until the fall of 2010.  Hypercoagulation panel was negative, although the patient's father  apparently also had DVT.  3. 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 The patient underwent dental extractions at [facility] on  01/31/2011. He received hyperbaric oxygen 40 treatments from late  March 2012 through mid June 2012.  4. Dyslipidemia.  5. Diabetes mellitus.   MEDICATIONS:  Reviewed and recorded. Current Outpatient Prescriptions  Medication Sig Dispense Refill  . ezetimibe-simvastatin (VYTORIN) 10-20 MG per tablet Take 1 tablet by mouth at bedtime.        [name] glimepiride (AMARYL) 4 MG tablet Take 4 mg by mouth daily before breakfast.        . levothyroxine (SYNTHROID, LEVOTHROID) 100 MCG tablet Take 50 mcg by mouth daily.      . Multiple Vitamin (MULTIVITAMIN) tablet Take 1 tablet by mouth daily.         No current facility-administered medications for this visit.    SMOKING HISTORY:  The patient has never smoked cigarettes.    HISTORY:  [name] was seen today for followup of his squamous cell carcinoma involving the base of the tongue, stage IVA, diagnosed in February 2009.  The patient was treated with chemotherapy and radiation. He continues to do well with no symptoms to suggest recurrent disease. It will be recalled that the patient  developed osteoradionecrosis of the left lower mandible in March 2012 treated with dental extractions at [facility] on 01/31/2011 and hyperbaric oxygen treatments from March 2012 through mid June 2012.  The patient has done well with regard to that problem.  His biggest problem has been control of his diabetes.  There have been some changes in his medicines.  He has lost about 30 pounds which he attributes to one of the medicines although he is probably closer to his ideal weight now than when he was weighing approximately 250 pounds. Today the patient's glucose came back 612.  The patient says that he has not been getting anything close to that on his meter.  He checked his capillary blood sugar yesterday and it was in the 200 range, as it has been whenever he checks it.  He denies any polyuria or polydipsia. Mucous membranes are dry due to the radiation treatments.  The patient also tells me that he has had some ultraviolet light treatments for some scalp lesions by Dr. Denna Haggard.  The patient is without any specific complaints today, feels generally well.  PHYSICAL EXAMINATION:  The patient shows signs of weight loss but actually is closer to his ideal weight.  Weight is 217 pounds 6.4 ounces.  Height 6 feet 3 inches.  Body surface area 2.28 sq m.  Blood pressure 131/81.  Other vital signs are normal.  There is no scleral icterus.  Mouth and pharynx are benign.  No obvious bone exposure.  The patient has had some  extractions in the lower left mandible area.  That area looks to be healed.  No adenopathy in the neck.  Heart and lungs are normal.  Abdomen is benign with no organomegaly or masses palpable. He has a lot of seborrheic keratoses over the trunk, particularly the back.  Extremities, no peripheral edema or clubbing.  Neurologic exam is normal.  LABORATORY DATA:  White count 5.8, ANC 4.5, hemoglobin 15.8, hematocrit 47.5, platelets 201,000.  Chemistries today were notable for a  glucose of 612 and a creatinine of 1.7 with a BUN of 25.  On 05/21/2012 glucose was 219, BUN 18, creatinine 1.3.  Today albumin was 3.9, LDH 178.  TSH from 05/21/2012 was 1.689.  IMAGING STUDIES:  1. CT scan of the chest with IV contrast from 10/11/2010 showed 2 tiny  subpleural nodules identified along the left major fissure on  images 24-25, were unchanged compared with the exam of March 2009.  There are no new pulmonary lesions, and the CT is stable without  evidence for metastatic disease.  2. CT scan of neck with IV contrast on 10/11/2010 showed stable  appearance of the neck without evidence for residual or recurrent  malignancy. There is some mild degenerative change in the upper  cervical spine which is stable.  3. Chest x-ray, 2 view, from 11/17/2011 is stable with no acute  findings. 4. Chest x-ray, 2 view, from 11/18/2012 was negative.   IMPRESSION AND PLAN:  [name] continues to do well, now 5 years from the time of diagnosis with no symptoms or signs of recurrent disease. The patient saw Dr. [name] in December of 2013 and was released.  He is due to see Dr. [name] this coming Wednesday 03/19 for a head and neck exam.  The patient will have a chest x-ray today.  He was given a copy of his labs and told about his elevated blood glucose.  He will check a CBG to make sure his glucose meter is working properly.  He will consult with Dr. Harrington Challenger regarding his elevated blood sugar.  As stated, he will have a chest x-ray today.  We will plan to see Mr. Allemang again in 6 months, at which time we will check CBC and chemistries.    ______________________________ Jeanie Cooks, M.D. DSM/MEDQ  D:  11/18/2012  T:  11/18/2012  Job:  [id number]

## 2012-11-18 NOTE — Progress Notes (Signed)
Quick Note:  Please notify patient and call/fax these results to patient's doctors. ______ 

## 2012-11-18 NOTE — Progress Notes (Signed)
This office note has been dictated.  [id number]

## 2012-11-20 ENCOUNTER — Telehealth: Payer: Self-pay | Admitting: Oncology

## 2012-11-20 NOTE — Telephone Encounter (Signed)
lmonvm for pt re appt for 9/15 and mailed schedule.

## 2013-05-15 ENCOUNTER — Telehealth: Payer: Self-pay | Admitting: Oncology

## 2013-05-15 NOTE — Telephone Encounter (Signed)
PT GOING ON VACATION AND WILL CALL BACK TO R/S

## 2013-05-19 ENCOUNTER — Other Ambulatory Visit: Payer: 59 | Admitting: Lab

## 2013-05-19 ENCOUNTER — Ambulatory Visit: Payer: 59

## 2013-09-13 ENCOUNTER — Encounter (INDEPENDENT_AMBULATORY_CARE_PROVIDER_SITE_OTHER): Payer: Self-pay

## 2013-09-13 ENCOUNTER — Encounter: Payer: 59 | Attending: Family Medicine

## 2013-09-13 VITALS — Ht 75.0 in | Wt 227.0 lb

## 2013-09-13 DIAGNOSIS — Z713 Dietary counseling and surveillance: Secondary | ICD-10-CM | POA: Insufficient documentation

## 2013-09-13 DIAGNOSIS — E119 Type 2 diabetes mellitus without complications: Secondary | ICD-10-CM | POA: Insufficient documentation

## 2013-09-13 NOTE — Progress Notes (Signed)
Patient was seen on 09/13/13 for the complete diabetes self-management series at the [facility].  Current A1c = 6.8% on 08/18/13  Handouts given during class include:  Living Well with Diabetes book  Carb Counting and Meal Planning book  Meal Plan Card  Carbohydrate guide  Meal planning worksheet  Low Sodium Flavoring Tips  The diabetes portion plate  Low Carbohydrate Snack Suggestions  A1c to eAG Conversion Chart  Diabetes Medications  Stress Management  Diabetes Recommended Care Schedule  Diabetes Success Plan  Core Class Satisfaction Survey  The following learning objectives were met by the patient during this course:  Describe diabetes  State some common risk factors for diabetes  Defines the role of glucose and insulin  Identifies type of diabetes and pathophysiology  Describe the relationship between diabetes and cardiovascular risk  State the members of the Healthcare Team  States the rationale for glucose monitoring  State when to test glucose  State their individual Target Range  State the importance of logging glucose readings  Describe how to interpret glucose readings  Identifies A1C target  Explain the correlation between A1c and eAG values  State symptoms and treatment of high blood glucose  State symptoms and treatment of low blood glucose  Explain proper technique for glucose testing  Identifies proper sharps disposal  Describe the role of different macronutrients on glucose  Explain how carbohydrates affect blood glucose  State what foods contain the most carbohydrates  Demonstrate carbohydrate counting  Demonstrate how to read Nutrition Facts food label  Describe effects of various fats on heart health  Describe the importance of good nutrition for health and healthy eating strategies  Describe techniques for managing your shopping, cooking and meal planning  List strategies to follow  meal plan when dining out  Describe the effects of alcohol on glucose and how to use it safely   State the amount of activity recommended for healthy living   Describe activities suitable for individual needs   Identify ways to regularly incorporate activity into daily life   Identify barriers to activity and ways to over come these barriers  Identify diabetes medications being personally used and their primary action for lowering glucose and possible side effects   Describe role of stress on blood glucose and develop strategies to address psychosocial issues   Identify diabetes complications and ways to prevent them  Explain how to manage diabetes during illness   Evaluate success in meeting personal goal   Establish 2-3 goals that they will plan to diligently work on until they return for the  26-monthfollow-up visit  Goals:  Follow Diabetes Meal Plan as instructed  Eat 3 meals and 2 snacks, every 3-5 hrs  Limit carbohydrate intake to 45 grams carbohydrate/meal Limit carbohydrate intake to 15 grams carbohydrate/snack Add lean protein foods to meals/snacks  Monitor glucose levels as instructed by your doctor  Aim for 15-30 mins of physical activity daily as tolerated  Bring food record and glucose log to your next nutrition visit  Your patient has established the following 4 month goals in their individualized success plan: I will increase my activity level at least 4 days a week for 30 minutes or more  I will take my diabetes medications as scheduled  Your patient has identified these potential barriers to change:  Getting up in the morning to exercise  Your patient has identified their diabetes self-care support plan as  None stated

## 2013-12-08 ENCOUNTER — Encounter: Payer: 59 | Attending: Family Medicine

## 2013-12-08 DIAGNOSIS — E119 Type 2 diabetes mellitus without complications: Secondary | ICD-10-CM | POA: Insufficient documentation

## 2013-12-08 DIAGNOSIS — Z713 Dietary counseling and surveillance: Secondary | ICD-10-CM | POA: Insufficient documentation

## 2013-12-08 NOTE — Patient Instructions (Signed)
Goals:  Follow Diabetes Meal Plan as instructed  Eat 3 meals and 2 snacks, every 3-5 hrs  Limit carbohydrate intake to 45 grams carbohydrate/meal Limit carbohydrate intake to 15 grams carbohydrate/snack Add lean protein foods to meals/snacks  Monitor glucose levels as instructed by your doctor  Aim for goal of 15-30 mins of physical activity daily as tolerated  Bring food record and glucose log to your next nutrition visit 

## 2013-12-08 NOTE — Progress Notes (Signed)
Patient was seen on 12/08/2013 for a review of the series of three diabetes self-management courses at the [facility]. The following learning objectives were met by the patient during this class:    Reviewed blood glucose monitoring and interpretation including the recommended target ranges and Hgb A1c.    Reviewed on carb counting, importance of regularly scheduled meals/snacks, and meal planning.    Reviewed the effects of physical activity on glucose levels and long-term glucose control.  Recommended goal of 150 minutes of physical activity/week.   Reviewed patient medications and discussed role of medication on blood glucose and possible side effects.   Discussed strategies to manage stress, psychosocial issues, and other obstacles to diabetes management.   Encouraged moderate weight reduction to improve glucose levels.     Reviewed short-term complications: hyper- and hypo-glycemia.  Discussed causes, symptoms, and treatment options.   Reviewed prevention, detection, and treatment of long-term complications.  Discussed the role of prolonged elevated glucose levels on body systems.  Goals:  Follow Diabetes Meal Plan as instructed  Eat 3 meals and 2 snacks, every 3-5 hrs  Limit carbohydrate intake to 45 grams carbohydrate/meal Limit carbohydrate intake to 15 grams carbohydrate/snack Add lean protein foods to meals/snacks  Monitor glucose levels as instructed by your doctor  Aim for goal of 15-30 mins of physical activity daily as tolerated  Bring food record and glucose log to your next nutrition visit   

## 2014-06-22 ENCOUNTER — Other Ambulatory Visit (HOSPITAL_COMMUNITY): Payer: Self-pay | Admitting: Orthopedic Surgery

## 2014-06-22 DIAGNOSIS — M7542 Impingement syndrome of left shoulder: Secondary | ICD-10-CM

## 2014-06-23 ENCOUNTER — Ambulatory Visit (HOSPITAL_COMMUNITY)
Admission: RE | Admit: 2014-06-23 | Discharge: 2014-06-23 | Disposition: A | Discharge status: Home / Self Care | Payer: 59 | Source: Ambulatory Visit | Attending: Orthopedic Surgery | Admitting: Orthopedic Surgery

## 2014-06-23 DIAGNOSIS — M7542 Impingement syndrome of left shoulder: Secondary | ICD-10-CM | POA: Diagnosis not present

## 2014-07-02 ENCOUNTER — Encounter (HOSPITAL_COMMUNITY): Payer: Self-pay | Admitting: Pharmacy Technician

## 2014-07-09 NOTE — Pre-Procedure Instructions (Signed)
[name]  07/09/2014   Your procedure is scheduled on:  Thurs, Nov 12 @ 10:05 AM  Report to [facility]  at 8:00 AM.  Call this number if you have problems the morning of surgery: [phone]   Remember:   Do not eat food or drink liquids after midnight.   Take these medicines the morning of surgery with A SIP OF WATER: Levothyroxine(Synthroid)              No Goody's,BC's,Aleve,Aspirin,Ibuprofen,Fish Oil,or any Herbal Medications   Do not wear jewelry  Do not wear lotions, powders, or colognes.   Men may shave face and neck.  Do not bring valuables to the hospital.  [facility] is not responsible                  for any belongings or valuables.               Contacts, dentures or bridgework may not be worn into surgery.  Leave suitcase in the car. After surgery it may be brought to your room.  For patients admitted to the hospital, discharge time is determined by your                treatment team.                 Special Instructions:  [place] - Preparing for Surgery  Before surgery, you can play an important role.  Because skin is not sterile, your skin needs to be as free of germs as possible.  You can reduce the number of germs on you skin by washing with CHG (chlorahexidine gluconate) soap before surgery.  CHG is an antiseptic cleaner which kills germs and bonds with the skin to continue killing germs even after washing.  Please DO NOT use if you have an allergy to CHG or antibacterial soaps.  If your skin becomes reddened/irritated stop using the CHG and inform your nurse when you arrive at Short Stay.  Do not shave (including legs and underarms) for at least 48 hours prior to the first CHG shower.  You may shave your face.  Please follow these instructions carefully:   1.  Shower with CHG Soap the night before surgery and the                                morning of Surgery.  2.  If you choose to wash your hair, wash your hair first as usual with  your       normal shampoo.  3.  After you shampoo, rinse your hair and body thoroughly to remove the                      Shampoo.  4.  Use CHG as you would any other liquid soap.  You can apply chg directly       to the skin and wash gently with scrungie or a clean washcloth.  5.  Apply the CHG Soap to your body ONLY FROM THE NECK DOWN.        Do not use on open wounds or open sores.  Avoid contact with your eyes,       ears, mouth and genitals (private parts).  Wash genitals (private parts)       with your normal soap.  6.  Wash thoroughly, paying special attention to the area where  your surgery        will be performed.  7.  Thoroughly rinse your body with warm water from the neck down.  8.  DO NOT shower/wash with your normal soap after using and rinsing off       the CHG Soap.  9.  Pat yourself dry with a clean towel.            10.  Wear clean pajamas.            11.  Place clean sheets on your bed the night of your first shower and do not        sleep with pets.  Day of Surgery  Do not apply any lotions/deoderants the morning of surgery.  Please wear clean clothes to the hospital/surgery center.     Please read over the following fact sheets that you were given: Pain Booklet, Coughing and Deep Breathing and Surgical Site Infection Prevention

## 2014-07-10 ENCOUNTER — Encounter (HOSPITAL_COMMUNITY)
Admission: RE | Admit: 2014-07-10 | Discharge: 2014-07-10 | Disposition: A | Discharge status: Home / Self Care | Payer: 59 | Source: Ambulatory Visit | Attending: Orthopedic Surgery | Admitting: Orthopedic Surgery

## 2014-07-10 ENCOUNTER — Other Ambulatory Visit: Payer: Self-pay

## 2014-07-10 ENCOUNTER — Encounter (HOSPITAL_COMMUNITY): Payer: Self-pay

## 2014-07-10 DIAGNOSIS — Z01812 Encounter for preprocedural laboratory examination: Secondary | ICD-10-CM | POA: Diagnosis present

## 2014-07-10 DIAGNOSIS — Z01810 Encounter for preprocedural cardiovascular examination: Secondary | ICD-10-CM | POA: Diagnosis present

## 2014-07-10 DIAGNOSIS — M7542 Impingement syndrome of left shoulder: Secondary | ICD-10-CM | POA: Diagnosis not present

## 2014-07-10 HISTORY — DX: Hyperlipidemia, unspecified: E78.5

## 2014-07-10 HISTORY — DX: Personal history of other infectious and parasitic diseases: Z86.19

## 2014-07-10 HISTORY — DX: Polyneuropathy, unspecified: G62.9

## 2014-07-10 HISTORY — DX: Hypothyroidism, unspecified: E03.9

## 2014-07-10 HISTORY — DX: Effusion, unspecified joint: M25.40

## 2014-07-10 HISTORY — DX: Unspecified osteoarthritis, unspecified site: M19.90

## 2014-07-10 HISTORY — DX: Pain in unspecified joint: M25.50

## 2014-07-10 HISTORY — DX: Bursopathy, unspecified: M71.9

## 2014-07-10 LAB — COMPREHENSIVE METABOLIC PANEL
ALBUMIN: 4.2 g/dL (ref 3.5–5.2)
ALT: 20 U/L (ref 0–53)
ANION GAP: 14 (ref 5–15)
AST: 15 U/L (ref 0–37)
Alkaline Phosphatase: 86 U/L (ref 39–117)
BUN: 15 mg/dL (ref 6–23)
CALCIUM: 9.6 mg/dL (ref 8.4–10.5)
CO2: 26 mEq/L (ref 19–32)
Chloride: 99 mEq/L (ref 96–112)
Creatinine, Ser: 1.04 mg/dL (ref 0.50–1.35)
GFR calc non Af Amer: 76 mL/min — ABNORMAL LOW (ref >90)
GFR, EST AFRICAN AMERICAN: 88 mL/min — AB (ref >90)
GLUCOSE: 268 mg/dL — AB (ref 70–99)
Potassium: 4.5 mEq/L (ref 3.7–5.3)
Sodium: 139 mEq/L (ref 137–147)
TOTAL PROTEIN: 7.2 g/dL (ref 6.0–8.3)
Total Bilirubin: 0.6 mg/dL (ref 0.3–1.2)

## 2014-07-10 LAB — PROTIME-INR
INR: 1.05 (ref 0.00–1.49)
Prothrombin Time: 13.8 seconds (ref 11.6–15.2)

## 2014-07-10 LAB — CBC WITH DIFFERENTIAL/PLATELET
Basophils Absolute: 0 10*3/uL (ref 0.0–0.1)
Basophils Relative: 0 % (ref 0–1)
EOS ABS: 0.1 10*3/uL (ref 0.0–0.7)
EOS PCT: 2 % (ref 0–5)
HCT: 44.3 % (ref 39.0–52.0)
Hemoglobin: 14.7 g/dL (ref 13.0–17.0)
Lymphocytes Relative: 15 % (ref 12–46)
Lymphs Abs: 0.8 10*3/uL (ref 0.7–4.0)
MCH: 29.4 pg (ref 26.0–34.0)
MCHC: 33.2 g/dL (ref 30.0–36.0)
MCV: 88.6 fL (ref 78.0–100.0)
Monocytes Absolute: 0.3 10*3/uL (ref 0.1–1.0)
Monocytes Relative: 6 % (ref 3–12)
NEUTROS PCT: 77 % (ref 43–77)
Neutro Abs: 4 10*3/uL (ref 1.7–7.7)
Platelets: 181 10*3/uL (ref 150–400)
RBC: 5 MIL/uL (ref 4.22–5.81)
RDW: 12.3 % (ref 11.5–15.5)
WBC: 5.2 10*3/uL (ref 4.0–10.5)

## 2014-07-10 LAB — APTT: aPTT: 30 seconds (ref 24–37)

## 2014-07-10 MED ORDER — CHLORHEXIDINE GLUCONATE 4 % EX LIQD: 60.0000 mL | Freq: Once | CUTANEOUS | Status: DC

## 2014-07-10 NOTE — Progress Notes (Signed)
Heart cath done 15+yrs ago  Pt doesn't have a cardiologist  Denies ever having an echo or stress test  Medical Md is Dr.Alan Harrington Challenger   Denies EKG or CXR in past yr

## 2014-07-10 NOTE — Progress Notes (Addendum)
Anesthesia Chart Review:  Pt is 58 year old male scheduled for L shoulder arthroscopy with rotator cuff repair and subacromial decompression/distal clavicle resection on 07/16/2014 with Dr. Onnie Graham.   PMH: DM, hyperlipidemia, hypothyroidism, DVT 2009, tongue/squamous cell cancer  Medications include: glimepiride, metformin, levothyroxine, sitagliptin, vytorin  Preoperative labs reviewed.  Glucose 268. Pt reports having large pasta lunch just prior to arriving to PAT. Blood glucose at home usually 120-160. Most recent HgbA1C was 7.2.  EKG: NSR.   If no changes, I anticipate pt can proceed with surgery as scheduled.   Willeen Cass, FNP-BC [facility]/Anesthesiology Phone: [phone] 07/10/2014 3:57 PM

## 2014-07-15 MED ORDER — CEFAZOLIN SODIUM-DEXTROSE 2-3 GM-% IV SOLR
2.0000 g | INTRAVENOUS | Status: AC
  Administered 2014-07-16: 2 g via INTRAVENOUS
  Filled 2014-07-15: qty 50

## 2014-07-16 ENCOUNTER — Ambulatory Visit (HOSPITAL_COMMUNITY): Payer: 59 | Admitting: Emergency Medicine

## 2014-07-16 ENCOUNTER — Ambulatory Visit (HOSPITAL_COMMUNITY): Payer: 59 | Admitting: Anesthesiology

## 2014-07-16 ENCOUNTER — Ambulatory Visit (HOSPITAL_COMMUNITY)
Admission: RE | Admit: 2014-07-16 | Discharge: 2014-07-16 | Disposition: A | Discharge status: Home / Self Care | Payer: 59 | Source: Ambulatory Visit | Attending: Orthopedic Surgery | Admitting: Orthopedic Surgery

## 2014-07-16 ENCOUNTER — Encounter (HOSPITAL_COMMUNITY)
Admission: RE | Disposition: A | Discharge status: Home / Self Care | Payer: Self-pay | Source: Ambulatory Visit | Attending: Orthopedic Surgery

## 2014-07-16 ENCOUNTER — Encounter (HOSPITAL_COMMUNITY): Payer: Self-pay | Admitting: *Deleted

## 2014-07-16 DIAGNOSIS — E119 Type 2 diabetes mellitus without complications: Secondary | ICD-10-CM | POA: Diagnosis not present

## 2014-07-16 DIAGNOSIS — Z86718 Personal history of other venous thrombosis and embolism: Secondary | ICD-10-CM | POA: Diagnosis not present

## 2014-07-16 DIAGNOSIS — E039 Hypothyroidism, unspecified: Secondary | ICD-10-CM | POA: Diagnosis not present

## 2014-07-16 DIAGNOSIS — Z833 Family history of diabetes mellitus: Secondary | ICD-10-CM | POA: Diagnosis not present

## 2014-07-16 DIAGNOSIS — Z79899 Other long term (current) drug therapy: Secondary | ICD-10-CM | POA: Diagnosis not present

## 2014-07-16 DIAGNOSIS — E785 Hyperlipidemia, unspecified: Secondary | ICD-10-CM | POA: Diagnosis not present

## 2014-07-16 DIAGNOSIS — M25512 Pain in left shoulder: Secondary | ICD-10-CM | POA: Diagnosis present

## 2014-07-16 DIAGNOSIS — M75102 Unspecified rotator cuff tear or rupture of left shoulder, not specified as traumatic: Secondary | ICD-10-CM | POA: Diagnosis not present

## 2014-07-16 DIAGNOSIS — G629 Polyneuropathy, unspecified: Secondary | ICD-10-CM | POA: Diagnosis not present

## 2014-07-16 DIAGNOSIS — S43432A Superior glenoid labrum lesion of left shoulder, initial encounter: Secondary | ICD-10-CM | POA: Diagnosis not present

## 2014-07-16 DIAGNOSIS — M1990 Unspecified osteoarthritis, unspecified site: Secondary | ICD-10-CM | POA: Diagnosis not present

## 2014-07-16 DIAGNOSIS — M19012 Primary osteoarthritis, left shoulder: Secondary | ICD-10-CM | POA: Diagnosis not present

## 2014-07-16 DIAGNOSIS — M7542 Impingement syndrome of left shoulder: Secondary | ICD-10-CM | POA: Diagnosis not present

## 2014-07-16 HISTORY — PX: SHOULDER ARTHROSCOPY WITH ROTATOR CUFF REPAIR AND SUBACROMIAL DECOMPRESSION: SHX5686

## 2014-07-16 LAB — GLUCOSE, CAPILLARY
GLUCOSE-CAPILLARY: 162 mg/dL — AB (ref 70–99)
Glucose-Capillary: 213 mg/dL — ABNORMAL HIGH (ref 70–99)

## 2014-07-16 SURGERY — SHOULDER ARTHROSCOPY WITH ROTATOR CUFF REPAIR AND SUBACROMIAL DECOMPRESSION
Anesthesia: Regional | Site: Shoulder | Laterality: Left

## 2014-07-16 MED ORDER — PROPOFOL 10 MG/ML IV BOLUS
INTRAVENOUS | Status: DC | PRN
  Administered 2014-07-16: 120 mg via INTRAVENOUS

## 2014-07-16 MED ORDER — ONDANSETRON HCL 4 MG/2ML IJ SOLN
INTRAMUSCULAR | Status: AC
  Filled 2014-07-16: qty 2

## 2014-07-16 MED ORDER — LIDOCAINE HCL (CARDIAC) 20 MG/ML IV SOLN
INTRAVENOUS | Status: DC | PRN
  Administered 2014-07-16: 20 mg via INTRAVENOUS

## 2014-07-16 MED ORDER — LIDOCAINE HCL (CARDIAC) 20 MG/ML IV SOLN
INTRAVENOUS | Status: AC
  Filled 2014-07-16: qty 5

## 2014-07-16 MED ORDER — PHENYLEPHRINE HCL 10 MG/ML IJ SOLN
INTRAMUSCULAR | Status: DC | PRN
  Administered 2014-07-16: 120 ug via INTRAVENOUS
  Administered 2014-07-16: 80 ug via INTRAVENOUS
  Administered 2014-07-16: 120 ug via INTRAVENOUS
  Administered 2014-07-16: 80 ug via INTRAVENOUS

## 2014-07-16 MED ORDER — HYDROMORPHONE HCL 1 MG/ML IJ SOLN: 0.2500 mg | INTRAMUSCULAR | Status: DC | PRN

## 2014-07-16 MED ORDER — LACTATED RINGERS IV SOLN
INTRAVENOUS | Status: DC
  Administered 2014-07-16: 09:00:00 via INTRAVENOUS

## 2014-07-16 MED ORDER — PHENYLEPHRINE 40 MCG/ML (10ML) SYRINGE FOR IV PUSH (FOR BLOOD PRESSURE SUPPORT)
PREFILLED_SYRINGE | INTRAVENOUS | Status: AC
  Filled 2014-07-16: qty 10

## 2014-07-16 MED ORDER — OXYCODONE-ACETAMINOPHEN 5-325 MG PO TABS: 1.0000 | ORAL_TABLET | ORAL | Status: DC | PRN

## 2014-07-16 MED ORDER — SODIUM CHLORIDE 0.9 % IR SOLN
Status: DC | PRN
  Administered 2014-07-16: 9000 mL

## 2014-07-16 MED ORDER — ROCURONIUM BROMIDE 50 MG/5ML IV SOLN
INTRAVENOUS | Status: AC
  Filled 2014-07-16: qty 2

## 2014-07-16 MED ORDER — PROPOFOL 10 MG/ML IV BOLUS
INTRAVENOUS | Status: AC
  Filled 2014-07-16: qty 20

## 2014-07-16 MED ORDER — NEOSTIGMINE METHYLSULFATE 10 MG/10ML IV SOLN
INTRAVENOUS | Status: AC
  Filled 2014-07-16: qty 1

## 2014-07-16 MED ORDER — GLYCOPYRROLATE 0.2 MG/ML IJ SOLN
INTRAMUSCULAR | Status: AC
  Filled 2014-07-16: qty 2

## 2014-07-16 MED ORDER — NAPROXEN 500 MG PO TABS
500.0000 mg | ORAL_TABLET | Freq: Two times a day (BID) | ORAL | Status: DC
Start: 2014-07-16 — End: 2014-12-10

## 2014-07-16 MED ORDER — OXYCODONE HCL 5 MG/5ML PO SOLN: 5.0000 mg | Freq: Once | ORAL | Status: DC | PRN

## 2014-07-16 MED ORDER — SODIUM CHLORIDE 0.9 % IV SOLN
10.0000 mg | INTRAVENOUS | Status: DC | PRN
  Administered 2014-07-16: 10 ug/min via INTRAVENOUS

## 2014-07-16 MED ORDER — NEOSTIGMINE METHYLSULFATE 10 MG/10ML IV SOLN
INTRAVENOUS | Status: DC | PRN
Start: 2014-07-16 — End: 2014-07-16
  Administered 2014-07-16: 3 mg via INTRAVENOUS

## 2014-07-16 MED ORDER — ONDANSETRON HCL 4 MG/2ML IJ SOLN
INTRAMUSCULAR | Status: DC | PRN
  Administered 2014-07-16: 4 mg via INTRAVENOUS

## 2014-07-16 MED ORDER — ROPIVACAINE HCL 5 MG/ML IJ SOLN
INTRAMUSCULAR | Status: DC | PRN
Start: 2014-07-16 — End: 2014-07-16
  Administered 2014-07-16: 30 mL via PERINEURAL

## 2014-07-16 MED ORDER — ROCURONIUM BROMIDE 100 MG/10ML IV SOLN
INTRAVENOUS | Status: DC | PRN
  Administered 2014-07-16: 50 mg via INTRAVENOUS

## 2014-07-16 MED ORDER — MIDAZOLAM HCL 2 MG/2ML IJ SOLN
1.0000 mg | INTRAMUSCULAR | Status: DC | PRN
  Administered 2014-07-16: 2 mg via INTRAVENOUS
  Filled 2014-07-16: qty 2

## 2014-07-16 MED ORDER — OXYCODONE HCL 5 MG PO TABS: 5.0000 mg | ORAL_TABLET | Freq: Once | ORAL | Status: DC | PRN

## 2014-07-16 MED ORDER — FENTANYL CITRATE 0.05 MG/ML IJ SOLN
50.0000 ug | INTRAMUSCULAR | Status: DC | PRN
  Administered 2014-07-16: 100 ug via INTRAVENOUS
  Filled 2014-07-16: qty 2

## 2014-07-16 MED ORDER — FENTANYL CITRATE 0.05 MG/ML IJ SOLN
INTRAMUSCULAR | Status: AC
  Filled 2014-07-16: qty 5

## 2014-07-16 MED ORDER — GLYCOPYRROLATE 0.2 MG/ML IJ SOLN
INTRAMUSCULAR | Status: DC | PRN
  Administered 2014-07-16: 0.4 mg via INTRAVENOUS

## 2014-07-16 MED ORDER — DEXAMETHASONE SODIUM PHOSPHATE 4 MG/ML IJ SOLN
INTRAMUSCULAR | Status: AC
  Filled 2014-07-16: qty 1

## 2014-07-16 MED ORDER — DIAZEPAM 5 MG PO TABS: 2.5000 mg | ORAL_TABLET | Freq: Four times a day (QID) | ORAL | Status: DC | PRN

## 2014-07-16 MED ORDER — DEXAMETHASONE SODIUM PHOSPHATE 4 MG/ML IJ SOLN
INTRAMUSCULAR | Status: DC | PRN
  Administered 2014-07-16: 4 mg via INTRAVENOUS

## 2014-07-16 SURGICAL SUPPLY — 62 items
ANCHOR PEEK 4.75X19.1 SWLK C (Anchor) ×2 IMPLANT
ANCHOR PEEK CORKSCREW 4.5 (Anchor) ×2 IMPLANT
BLADE CUTTER GATOR 3.5 (BLADE) ×2 IMPLANT
BLADE GREAT WHITE 4.2 (BLADE) ×2 IMPLANT
BLADE SURG 11 STRL SS (BLADE) ×2 IMPLANT
BOOTCOVER CLEANROOM LRG (PROTECTIVE WEAR) ×4 IMPLANT
BUR 3.5 LG SPHERICAL (BURR) IMPLANT
BUR OVAL 4.0 (BURR) ×2 IMPLANT
BURR 3.5 LG SPHERICAL (BURR)
CANISTER SUCT LVC 12 LTR MEDI- (MISCELLANEOUS) ×2 IMPLANT
CANNULA ACUFLEX KIT 5X76 (CANNULA) ×2 IMPLANT
CANNULA DRILOCK 5.0X75 (CANNULA) ×6 IMPLANT
CLSR STERI-STRIP ANTIMIC 1/2X4 (GAUZE/BANDAGES/DRESSINGS) ×2 IMPLANT
CONNECTOR 5 IN 1 STRAIGHT STRL (MISCELLANEOUS) ×2 IMPLANT
DRAPE INCISE 23X17 IOBAN STRL (DRAPES) ×1
DRAPE INCISE IOBAN 23X17 STRL (DRAPES) ×1 IMPLANT
DRAPE INCISE IOBAN 66X45 STRL (DRAPES) ×2 IMPLANT
DRAPE ORTHO SPLIT 77X108 STRL (DRAPES) ×2
DRAPE STERI 35X30 U-POUCH (DRAPES) ×2 IMPLANT
DRAPE SURG 17X11 SM STRL (DRAPES) ×2 IMPLANT
DRAPE SURG ORHT 6 SPLT 77X108 (DRAPES) ×2 IMPLANT
DRAPE U-SHAPE 47X51 STRL (DRAPES) ×2 IMPLANT
DRSG PAD ABDOMINAL 8X10 ST (GAUZE/BANDAGES/DRESSINGS) ×2 IMPLANT
DURAPREP 26ML APPLICATOR (WOUND CARE) ×4 IMPLANT
ELECT REM PT RETURN 9FT ADLT (ELECTROSURGICAL) ×2
ELECTRODE REM PT RTRN 9FT ADLT (ELECTROSURGICAL) ×1 IMPLANT
GAUZE SPONGE 4X4 12PLY STRL (GAUZE/BANDAGES/DRESSINGS) ×2 IMPLANT
GLOVE BIO SURGEON STRL SZ7.5 (GLOVE) ×2 IMPLANT
GLOVE BIO SURGEON STRL SZ8 (GLOVE) ×2 IMPLANT
GLOVE EUDERMIC 7 POWDERFREE (GLOVE) ×2 IMPLANT
GLOVE SS BIOGEL STRL SZ 7.5 (GLOVE) ×1 IMPLANT
GLOVE SUPERSENSE BIOGEL SZ 7.5 (GLOVE) ×1
GOWN STRL REUS W/ TWL LRG LVL3 (GOWN DISPOSABLE) ×1 IMPLANT
GOWN STRL REUS W/ TWL XL LVL3 (GOWN DISPOSABLE) ×4 IMPLANT
GOWN STRL REUS W/TWL LRG LVL3 (GOWN DISPOSABLE) ×1
GOWN STRL REUS W/TWL XL LVL3 (GOWN DISPOSABLE) ×4
KIT BASIN OR (CUSTOM PROCEDURE TRAY) ×2 IMPLANT
KIT ROOM TURNOVER OR (KITS) ×2 IMPLANT
KIT SHOULDER TRACTION (DRAPES) ×2 IMPLANT
MANIFOLD NEPTUNE II (INSTRUMENTS) ×2 IMPLANT
NDL SUT 6 .5 CRC .975X.05 MAYO (NEEDLE) ×1 IMPLANT
NEEDLE MAYO TAPER (NEEDLE) ×1
NEEDLE SPNL 18GX3.5 QUINCKE PK (NEEDLE) ×2 IMPLANT
NS IRRIG 1000ML POUR BTL (IV SOLUTION) ×2 IMPLANT
PACK SHOULDER (CUSTOM PROCEDURE TRAY) ×2 IMPLANT
PAD ARMBOARD 7.5X6 YLW CONV (MISCELLANEOUS) ×4 IMPLANT
SET ARTHROSCOPY TUBING (MISCELLANEOUS) ×1
SET ARTHROSCOPY TUBING LN (MISCELLANEOUS) ×1 IMPLANT
SLING ARM LRG ADULT FOAM STRAP (SOFTGOODS) IMPLANT
SLING ARM MED ADULT FOAM STRAP (SOFTGOODS) ×2 IMPLANT
SPONGE LAP 4X18 X RAY DECT (DISPOSABLE) ×2 IMPLANT
STRIP CLOSURE SKIN 1/2X4 (GAUZE/BANDAGES/DRESSINGS) ×2 IMPLANT
SUT MNCRL AB 3-0 PS2 18 (SUTURE) ×2 IMPLANT
SUT PDS AB 0 CT 36 (SUTURE) IMPLANT
SUT RETRIEVER GRASP 30 DEG (SUTURE) ×2 IMPLANT
SYR 20CC LL (SYRINGE) ×2 IMPLANT
TAPE CLOTH 4X10 WHT NS (GAUZE/BANDAGES/DRESSINGS) ×2 IMPLANT
TAPE PAPER 3X10 WHT MICROPORE (GAUZE/BANDAGES/DRESSINGS) ×2 IMPLANT
TOWEL OR 17X24 6PK STRL BLUE (TOWEL DISPOSABLE) ×2 IMPLANT
TOWEL OR 17X26 10 PK STRL BLUE (TOWEL DISPOSABLE) ×2 IMPLANT
WAND SUCTION MAX 4MM 90S (SURGICAL WAND) ×2 IMPLANT
WATER STERILE IRR 1000ML POUR (IV SOLUTION) ×2 IMPLANT

## 2014-07-16 NOTE — Transfer of Care (Signed)
Immediate Anesthesia Transfer of Care Note  Patient: [name]  Procedure(s) Performed: Procedure(s): LEFT SHOULDER ARTHROSCOPY WITH ROTATOR CUFF REPAIR AND SUBACROMIAL DECOMPRESSION/DISTAL CLAVICLE RESECTION (Left)  Patient Location: PACU  Anesthesia Type:General and Regional  Level of Consciousness: awake, alert  and oriented  Airway & Oxygen Therapy: Patient Spontanous Breathing and Patient connected to nasal cannula oxygen  Post-op Assessment: Report given to PACU RN, Post -op Vital signs reviewed and stable and Patient moving all extremities  Post vital signs: Reviewed and stable  Complications: No apparent anesthesia complications

## 2014-07-16 NOTE — Op Note (Signed)
07/16/2014  12:07 PM  PATIENT:   [name]  58 y.o. male  PRE-OPERATIVE DIAGNOSIS:  left shoulder impingement ac joint OA, rotator cuff tear  POST-OPERATIVE DIAGNOSIS:  Same with labral tear  PROCEDURE:  LSA, labral debridement, sad, DCR, RCR  SURGEON:  Tinslee Klare, Metta Clines M.D.  ASSISTANTS: Shuford pac   ANESTHESIA:   GET + ISB  EBL: min  SPECIMEN:  none  Drains: none   PATIENT DISPOSITION:  PACU - hemodynamically stable.    PLAN OF CARE: Discharge to home after PACU  Dictation# [id number]

## 2014-07-16 NOTE — Discharge Instructions (Signed)
Metta Clines. Supple, M.D., F.A.A.O.S. Orthopaedic Surgery Specializing in Arthroscopic and Reconstructive Surgery of the Shoulder and Knee [phone] [place], [place] [address] - Fax [phone]  POST-OP SHOULDER ARTHROSCOPIC ROTATOR CUFF  REPAIR INSTRUCTIONS  1. Call the office at [phone] to schedule your first post-op appointment 7-10 days from the date of your surgery.  2. Leave the steri-strips in place over your incisions when performing dressing changes and showering. You may remove your dressings and begin showering 72 hours from surgery. You can expect drainage that is clear to bloody in nature that occasionally will soak through your dressings. If this occurs go ahead and perform a dressing change. The drainage should lessen daily and when there is no drainage from your incisions feel free to go without a dressing.  3. Wear your sling/immobilizer at all times except to perform the exercises below or to occasionally let your arm dangle by your side to stretch your elbow. You also need to sleep in your sling immobilizer until instructed otherwise.  4. Range of motion to your elbow, wrist, and hand are encouraged 3-5 times daily. Exercise to your hand and fingers helps to reduce swelling you may experience.  5. Utilize ice to the shoulder 3-4 times minimum a day and additionally if you are experiencing pain.  6. You may one-armed drive when safely off of narcotics and muscle relaxants. You may use your hand that is in the sling to support the steering wheel only. However, should it be your right arm that is in the sling it is not to be used for gear shifting in a manual transmission.  7. If you had a block pre-operatively to provide post-op pain relief you may want to go ahead and begin utilizing your pain meds as your arm begins to wake up. Blocks can sometimes last up to 16-18 hours. If you are still pain-free prior to going to bed you may want to  strongly consider taking a pain medication to avoid being awakened in the night with the onset of pain. A muscle relaxant is also provided for you should you experience muscle spasms. It is recommended that if you are experiencing pain that your pain medication alone is not controlling, add the muscle relaxant along with the pain medication which can give additional pain relief. The first one to two days is generally the most severe of your pain and then should gradually decrease. As your pain lessens it is recommended that you decrease your use of the pain medications to an "as needed basis" only and to always comply with the recommended dosages of the pain medications.  8. Pain medications can produce constipation along with their use. If you experience this, the use of an over the counter stool softener or laxative daily is recommended.   9. For additional questions or concerns, please do not hesitate to call the office. If after hours there is an answering service to forward your concerns to the physician on call.  POST-OP EXERCISES  Pendulum Exercises  Perform pendulum exercises while standing and bending at the waist. Support your uninvolved arm on a table or chair and allow your operated arm to hang freely. Make sure to do these exercises passively - not using you shoulder muscle.  Repeat 20 times. Do 3 sessions per day.  What to eat:  For your first meals, you should eat lightly; only small meals initially.  If you do not have nausea, you may eat larger meals.  Avoid spicy, greasy and heavy food.    General Anesthesia, Adult, Care After  Refer to this sheet in the next few weeks. These instructions provide you with information on caring for yourself after your procedure. Your health care provider may also give you more specific instructions. Your treatment has been planned according to current medical practices, but problems sometimes occur. Call your health care provider if you have any  problems or questions after your procedure.  WHAT TO EXPECT AFTER THE PROCEDURE  After the procedure, it is typical to experience:  Sleepiness.  Nausea and vomiting. HOME CARE INSTRUCTIONS  For the first 24 hours after general anesthesia:  Have a responsible person with you.  Do not drive a car. If you are alone, do not take public transportation.  Do not drink alcohol.  Do not take medicine that has not been prescribed by your health care provider.  Do not sign important papers or make important decisions.  You may resume a normal diet and activities as directed by your health care provider.  Change bandages (dressings) as directed.  If you have questions or problems that seem related to general anesthesia, call the hospital and ask for the anesthetist or anesthesiologist on call. SEEK MEDICAL CARE IF:  You have nausea and vomiting that continue the day after anesthesia.  You develop a rash. SEEK IMMEDIATE MEDICAL CARE IF:  You have difficulty breathing.  You have chest pain.  You have any allergic problems. Document Released: 11/27/2000 Document Revised: 04/23/2013 Document Reviewed: 03/06/2013  [facility] Patient Information 2014 [place], [place].

## 2014-07-16 NOTE — Anesthesia Procedure Notes (Addendum)
Anesthesia Regional Block:  Interscalene brachial plexus block  Pre-Anesthetic Checklist: ,, timeout performed, Correct Patient, Correct Site, Correct Laterality, Correct Procedure, Correct Position, site marked, Risks and benefits discussed, pre-op evaluation,  At surgeon's request and post-op pain management  Laterality: Left  Prep: Maximum Sterile Barrier Precautions used and chloraprep       Needles:  Injection technique: Single-shot  Needle Type: Echogenic Stimulator Needle     Needle Length: 5cm 5 cm Needle Gauge: 22 and 22 G    Additional Needles:  Procedures: ultrasound guided (picture in chart) and nerve stimulator Interscalene brachial plexus block  Nerve Stimulator or Paresthesia:  Response: Biceps response,   Additional Responses:   Narrative:  Start time: 07/16/2014 9:30 AM End time: 07/16/2014 9:40 AM Injection made incrementally with aspirations every 5 mL.  Additional Notes: 2% Lidocaine skin wheel.    Procedure Name: Intubation Date/Time: 07/16/2014 10:24 AM Performed by: Rush Farmer E Pre-anesthesia Checklist: Patient identified, Emergency Drugs available, Suction available, Patient being monitored and Timeout performed Patient Re-evaluated:Patient Re-evaluated prior to inductionOxygen Delivery Method: Circle system utilized Preoxygenation: Pre-oxygenation with 100% oxygen Intubation Type: IV induction Ventilation: Mask ventilation without difficulty Laryngoscope Size: Mac and 4 Grade View: Grade II Tube type: Oral Tube size: 7.5 mm Number of attempts: 1 Airway Equipment and Method: Stylet Placement Confirmation: ETT inserted through vocal cords under direct vision,  positive ETCO2 and breath sounds checked- equal and bilateral Secured at: 22 cm Tube secured with: Tape Dental Injury: Teeth and Oropharynx as per pre-operative assessment

## 2014-07-16 NOTE — H&P (Signed)
[name]    Chief Complaint: left shoulder impingement HPI: The patient is a 58 y.o. male with chronic left shoulder pain and MRI evidence of full thickness rotator cuff tear  Past Medical History  Diagnosis Date  . Pneumonia   . History of radiation therapy 01/28/08-03/11/08    left base tongue  . History of chemotherapy     Cisplatin/Taxotere,5-FU  . Alopecia     s/p chemotherapy  . 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 Seborrheic keratosis     multiple on back  . Diabetes mellitus without complication     takes Amaryl,Januvia,and Metformin,daily  . Hyperlipidemia     takes Vytorin daily  . Hypothyroidism     takes Synthroid daily  . DVT, lower extremity 2009    side effect from chemo and has blood clot left leg-wears compression stockings;was on Coumadin for 43months and has been off 5 yrs  . Neuropathy     left foot  . 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 Arthritis   . Joint pain   . Joint swelling   . Bursitis   . History of shingles     Past Surgical History  Procedure Laterality Date  . Knee surgery Left 1993  . Pilonidal cyst / sinus excision  1974  . Right wrist fracture Right 2004  . Teeth extraction #9    . Port-a-cath removal  2010  . Mouth surgery    . Feeding tube placed  2009 and then removed  . Cardiac catheterization    . Port a cath placement      Family History  Problem Relation Age of Onset  . Cancer Mother     uterine  . Diabetes Brother     Social History:  reports that he has never smoked. He has never used smokeless tobacco. He reports that he drinks alcohol. He reports that he does not use illicit drugs.  Allergies: No Known Allergies  Medications Prior to Admission  Medication Sig Dispense Refill  . ezetimibe-simvastatin (VYTORIN) 10-20 MG per tablet Take 1 tablet by mouth at bedtime.      [name] glimepiride (AMARYL) 4 MG tablet Take 4 mg by mouth 2 (two) times daily.     [name]  levothyroxine (SYNTHROID, LEVOTHROID) 75 MCG tablet Take 75 mcg by mouth daily before breakfast.    . metFORMIN (GLUCOPHAGE) 500 MG tablet Take 1,000 mg by mouth 2 (two) times daily with a meal.    . sitaGLIPtin (JANUVIA) 100 MG tablet Take 100 mg by mouth daily.       Physical Exam: let shoulder with painful and restricted motion as noted at recent office visits.  Vitals  Temp:  [97.7 F (36.5 C)] 97.7 F (36.5 C) (11/12 0813) Resp:  [20] 20 (11/12 0813) BP: (150)/(76) 150/76 mmHg (11/12 0813) SpO2:  [98 %] 98 % (11/12 0813) Weight:  [102.059 kg (225 lb)] 102.059 kg (225 lb) (11/12 0813)  Assessment/Plan  Impression: left shoulder impingement  Plan of Action: Procedure(s): LEFT SHOULDER ARTHROSCOPY WITH ROTATOR CUFF REPAIR AND SUBACROMIAL DECOMPRESSION/DISTAL CLAVICLE RESECTION  Navi Ewton M 07/16/2014, 9:38 AM

## 2014-07-16 NOTE — Anesthesia Preprocedure Evaluation (Addendum)
Anesthesia Evaluation  Patient identified by MRN, date of birth, ID band Patient awake    Reviewed: Allergy & Precautions, H&P , NPO status , Patient's Chart, lab work & pertinent test results  Airway Mallampati: III  TM Distance: >3 FB Neck ROM: Full    Dental no notable dental hx. (+) Teeth Intact, Dental Advisory Given   Pulmonary neg pulmonary ROS, resolved,  breath sounds clear to auscultation  Pulmonary exam normal       Cardiovascular + Peripheral Vascular Disease Rhythm:Regular Rate:Normal     Neuro/Psych Depression negative neurological ROS  negative psych ROS   GI/Hepatic negative GI ROS, Neg liver ROS,   Endo/Other  diabetes, Well Controlled, Type 2, Oral Hypoglycemic AgentsHypothyroidism   Renal/GU negative Renal ROS  negative genitourinary   Musculoskeletal  (+) Arthritis -, Osteoarthritis,    Abdominal   Peds  Hematology negative hematology ROS (+)   Anesthesia Other Findings   Reproductive/Obstetrics negative OB ROS                           Anesthesia Physical Anesthesia Plan  ASA: II  Anesthesia Plan: General and Regional   Post-op Pain Management:    Induction: Intravenous  Airway Management Planned: Oral ETT  Additional Equipment:   Intra-op Plan:   Post-operative Plan: Extubation in OR  Informed Consent: I have reviewed the patients History and Physical, chart, labs and discussed the procedure including the risks, benefits and alternatives for the proposed anesthesia with the patient or authorized representative who has indicated his/her understanding and acceptance.   Dental advisory given  Plan Discussed with: CRNA  Anesthesia Plan Comments:         Anesthesia Quick Evaluation

## 2014-07-16 NOTE — Anesthesia Postprocedure Evaluation (Signed)
  Anesthesia Post-op Note  Patient: [name]  Procedure(s) Performed: Procedure(s): LEFT SHOULDER ARTHROSCOPY WITH ROTATOR CUFF REPAIR AND SUBACROMIAL DECOMPRESSION/DISTAL CLAVICLE RESECTION (Left)  Patient Location: PACU  Anesthesia Type:General and block  Level of Consciousness: awake and alert   Airway and Oxygen Therapy: Patient Spontanous Breathing  Post-op Pain: none  Post-op Assessment: Post-op Vital signs reviewed, Patient's Cardiovascular Status Stable and Respiratory Function Stable  Post-op Vital Signs: Reviewed  Filed Vitals:   07/16/14 1259  BP: 137/81  Pulse: 76  Temp:   Resp: 19    Complications: No apparent anesthesia complications

## 2014-07-16 NOTE — Progress Notes (Signed)
CRNA at bedside.

## 2014-07-17 ENCOUNTER — Encounter (HOSPITAL_COMMUNITY): Payer: Self-pay | Admitting: Orthopedic Surgery

## 2014-07-17 NOTE — Op Note (Signed)
NAMEDEVINN, HURWITZ NO.:  [id number]  MEDICAL RECORD NO.:  [id number]  LOCATION:  [facility]                         FACILITY:  [place] [place]  PHYSICIAN:  Metta Clines. Yurika Pereda, M.D.  DATE OF BIRTH:  01/20/1956  DATE OF PROCEDURE:  07/16/2014 DATE OF DISCHARGE:  07/16/2014                              OPERATIVE REPORT   PREOPERATIVE DIAGNOSES: 1. Chronic left shoulder impingement syndrome. 2. Left shoulder acromioclavicular joint arthrosis. 3. Left shoulder rotator cuff tear.  POSTOPERATIVE DIAGNOSES: 1. Chronic left shoulder impingement syndrome. 2. Left shoulder acromioclavicular joint arthrosis. 3. Left shoulder rotator cuff tear. 4. Left shoulder complex labral tear.  PROCEDURES: 1. Left shoulder examination under anesthesia. 2. Left shoulder glenohumeral joint diagnostic arthroscopy. 3. Debridement of extensive degenerative labral tear. 4. Arthroscopic subacromial decompression and bursectomy. 5. Arthroscopic distal clavicle resection. 6. Arthroscopic rotator cuff repair using double-row suture bridge     repair construct.  SURGEON:  Metta Clines. Yatzil Clippinger, M.D.  Terrence DupontOlivia Mackie A. Shuford, P.A.-C.  ANESTHESIA:  General endotracheal as well as an interscalene block.  BLOOD LOSS:  None.  DRAINS:  None.  HISTORY:  Mr. Hermann is a 58 year old gentleman, who has had chronic progressive increasing left shoulder pain with a severely positive impingement sign and clinical examination showing pain and weakness with testing of the rotator cuff.  MRI scan confirms a full-thickness tear of the rotator cuff with bony impingement.  Due to his ongoing pain and functional limitations, he is brought to the operating room at this time for planned left shoulder arthroscopy as described below.  I preoperatively counseled Mr. Moretto regarding treatment options and risks versus benefits thereof.  Possible surgical complications were all reviewed including potential for bleeding,  infection, neurovascular injury, persistent pain, loss of motion, anesthetic complication, recurrence of rotator cuff tear, possible need for additional surgery. He understands and accepts and agrees with our planned procedure.  PROCEDURE IN DETAIL:  After undergoing routine preop evaluation, the patient received prophylactic antibiotics.  An interscalene block was established in the holding area by the Anesthesia Department.  Placed supine on the operating table, underwent smooth induction of a general endotracheal anesthesia.  Turned to the right lateral decubitus position on a beanbag and appropriately padded and protected.  Left shoulder examination under anesthesia revealed approximately 150 degrees for elevation, 80 degrees of external and internal rotation.  Left arm suspended at 70 degrees abduction with 10 pounds of traction.  Left shoulder girdle region was sterilely prepped and draped in standard fashion.  Time-out was called.  Posterior portal was established into the glenohumeral joint and anterior portal was established under direct visualization.  The articular surface was found to be in good condition. There was degenerative tearing of the superior half of the labrum which was debrided back to stable margin with a shaver.  The biceps anchor was stable.  Biceps tendon, normal caliber within the distal instability. No instability patterns.  The rotator cuff showed significant articular- sided fraying and debridement of this area showed that there was indeed a full-thickness defect involving the distal supraspinatus.  Remaining inspection of the glenohumeral joint showed no additional pathologies. Fluid and instruments were then removed.  The arm was dropped down to 30 degrees of abduction with the arthroscope introduced in the subacromial space to the posterior portal and a direct lateral portal site from the subacromial space.  Abundant dense bursal tissue and multiple  adhesions were encountered and these were all divided and excised with a combination of shaver and Stryker wand.  The wand was then used to remove the periosteum from the undersurface of the anterior half of the acromion.  Then, a subacromial decompression was performed with a bur creating a type 1 morphology.  Of note, there was an accessory ossicle adjacent to the anterior margin of the acromion which was mobile and markedly impinging the underlying rotator cuff and this was excised in its entirety.  A portal was then established directly anterior to the distal clavicle and the distal clavicle resection was performed with a bur.  Care was taken to confirm visualization of entire circumference of the distal clavicle to ensure adequate removal of bone.  We then completed the subacromial/subdeltoid bursectomy and readily identified the rotator cuff tear.  The tear was debrided back to stable margin of healthy tissue with a shaver and then we removed soft tissue and tuberosity, and gently abraded the bone to bleeding bed with the footprint approximately 2.5 cm in width.  An accessory portal device was established and through the stab wound off lateral margin of the acromion, placed an Arthrex PEEK corkscrew suture anchor.  The free limbs of the suture anchor were then shuttled equidistant across the width of the tear in horizontal mattress pattern.  We then tied with sliding locking knots followed by multiple overhand throws and alternating posts.  We then created "suture bridge" with 2 swivel lock suture anchors, which compressed the margin of the rotator cuff against the bony bed of tuberosity, and overall construct was much to our satisfaction.  Suture limbs were all clipped.  Bursectomy was completed. Hemostasis was obtained.  Fluid and instruments were removed.  The portals were closed with Monocryl and Steri-Strips.  A dry dressing taped to the left shoulder.  Left arm was placed in a  sling immobilizer and the patient awakened, extubated, and take to recovery room in stable condition.  Jenetta Loges, PA-C was used as an Environmental consultant throughout the case essential for help with positioning the patient, positioning the extremity, management of the arthroscopic equipment, tissue manipulation, suture management, wound closure, and intraoperative decision making.     Metta Clines. Abbygayle Helfand, M.D.     KMS/MEDQ  D:  07/16/2014  T:  07/16/2014  Job:  [id number]

## 2014-10-21 ENCOUNTER — Other Ambulatory Visit: Payer: Self-pay | Admitting: Family Medicine

## 2014-11-13 ENCOUNTER — Other Ambulatory Visit: Payer: Self-pay | Admitting: Family Medicine

## 2014-12-01 ENCOUNTER — Ambulatory Visit: Payer: Self-pay

## 2014-12-06 ENCOUNTER — Inpatient Hospital Stay (HOSPITAL_COMMUNITY)
Admission: EM | Admit: 2014-12-06 | Discharge: 2014-12-10 | DRG: 176 | Disposition: A | Discharge status: Home / Self Care | Payer: 59 | Attending: Internal Medicine | Admitting: Internal Medicine

## 2014-12-06 ENCOUNTER — Emergency Department (HOSPITAL_COMMUNITY): Payer: 59

## 2014-12-06 ENCOUNTER — Encounter (HOSPITAL_COMMUNITY): Payer: Self-pay | Admitting: Emergency Medicine

## 2014-12-06 DIAGNOSIS — R0602 Shortness of breath: Secondary | ICD-10-CM | POA: Diagnosis not present

## 2014-12-06 DIAGNOSIS — E119 Type 2 diabetes mellitus without complications: Secondary | ICD-10-CM | POA: Diagnosis not present

## 2014-12-06 DIAGNOSIS — G629 Polyneuropathy, unspecified: Secondary | ICD-10-CM | POA: Diagnosis present

## 2014-12-06 DIAGNOSIS — E039 Hypothyroidism, unspecified: Secondary | ICD-10-CM | POA: Diagnosis not present

## 2014-12-06 DIAGNOSIS — I82413 Acute embolism and thrombosis of femoral vein, bilateral: Secondary | ICD-10-CM | POA: Diagnosis present

## 2014-12-06 DIAGNOSIS — Z85810 Personal history of malignant neoplasm of tongue: Secondary | ICD-10-CM | POA: Diagnosis not present

## 2014-12-06 DIAGNOSIS — I2699 Other pulmonary embolism without acute cor pulmonale: Principal | ICD-10-CM | POA: Diagnosis present

## 2014-12-06 DIAGNOSIS — Z86711 Personal history of pulmonary embolism: Secondary | ICD-10-CM | POA: Diagnosis not present

## 2014-12-06 DIAGNOSIS — E785 Hyperlipidemia, unspecified: Secondary | ICD-10-CM | POA: Diagnosis present

## 2014-12-06 DIAGNOSIS — Z86718 Personal history of other venous thrombosis and embolism: Secondary | ICD-10-CM

## 2014-12-06 DIAGNOSIS — C01 Malignant neoplasm of base of tongue: Secondary | ICD-10-CM | POA: Diagnosis not present

## 2014-12-06 DIAGNOSIS — Z923 Personal history of irradiation: Secondary | ICD-10-CM | POA: Diagnosis not present

## 2014-12-06 DIAGNOSIS — I82403 Acute embolism and thrombosis of unspecified deep veins of lower extremity, bilateral: Secondary | ICD-10-CM | POA: Diagnosis not present

## 2014-12-06 DIAGNOSIS — Z9221 Personal history of antineoplastic chemotherapy: Secondary | ICD-10-CM

## 2014-12-06 DIAGNOSIS — I313 Pericardial effusion (noninflammatory): Secondary | ICD-10-CM | POA: Diagnosis not present

## 2014-12-06 DIAGNOSIS — I82409 Acute embolism and thrombosis of unspecified deep veins of unspecified lower extremity: Secondary | ICD-10-CM | POA: Diagnosis present

## 2014-12-06 HISTORY — DX: Other pulmonary embolism without acute cor pulmonale: I26.99

## 2014-12-06 HISTORY — DX: Reserved for inherently not codable concepts without codable children: IMO0001

## 2014-12-06 LAB — BASIC METABOLIC PANEL
Anion gap: 12 (ref 5–15)
BUN: 21 mg/dL (ref 6–23)
CALCIUM: 9.7 mg/dL (ref 8.4–10.5)
CO2: 21 mmol/L (ref 19–32)
CREATININE: 1.27 mg/dL (ref 0.50–1.35)
Chloride: 103 mmol/L (ref 96–112)
GFR calc Af Amer: 69 mL/min — ABNORMAL LOW (ref >90)
GFR calc non Af Amer: 59 mL/min — ABNORMAL LOW (ref >90)
Glucose, Bld: 242 mg/dL — ABNORMAL HIGH (ref 70–99)
POTASSIUM: 4.2 mmol/L (ref 3.5–5.1)
Sodium: 136 mmol/L (ref 135–145)

## 2014-12-06 LAB — CBC
HCT: 46.8 % (ref 39.0–52.0)
Hemoglobin: 15.7 g/dL (ref 13.0–17.0)
MCH: 28.5 pg (ref 26.0–34.0)
MCHC: 33.5 g/dL (ref 30.0–36.0)
MCV: 85.1 fL (ref 78.0–100.0)
PLATELETS: 178 10*3/uL (ref 150–400)
RBC: 5.5 MIL/uL (ref 4.22–5.81)
RDW: 12.9 % (ref 11.5–15.5)
WBC: 7.8 10*3/uL (ref 4.0–10.5)

## 2014-12-06 LAB — I-STAT TROPONIN, ED: Troponin i, poc: 0.06 ng/mL (ref 0.00–0.08)

## 2014-12-06 LAB — D-DIMER, QUANTITATIVE (NOT AT ARMC): D DIMER QUANT: 3.14 ug{FEU}/mL — AB (ref 0.00–0.48)

## 2014-12-06 MED ORDER — IOHEXOL 350 MG/ML SOLN
75.0000 mL | Freq: Once | INTRAVENOUS | Status: AC | PRN
  Administered 2014-12-06: 75 mL via INTRAVENOUS

## 2014-12-06 MED ORDER — HEPARIN BOLUS VIA INFUSION
5500.0000 [IU] | Freq: Once | INTRAVENOUS | Status: AC
  Administered 2014-12-06: 5500 [IU] via INTRAVENOUS
  Filled 2014-12-06: qty 5500

## 2014-12-06 MED ORDER — HEPARIN (PORCINE) IN NACL 100-0.45 UNIT/ML-% IJ SOLN
2100.0000 [IU]/h | INTRAMUSCULAR | Status: AC
  Administered 2014-12-06: 1500 [IU]/h via INTRAVENOUS
  Administered 2014-12-08 – 2014-12-09 (×3): 2100 [IU]/h via INTRAVENOUS
  Filled 2014-12-06 (×6): qty 250

## 2014-12-06 NOTE — ED Notes (Signed)
Patient placed on 2L due to O2 SATS 91% on RA

## 2014-12-06 NOTE — H&P (Signed)
[facility] History and Physical  Patient: [name]  MRN: [id number]  DOB: [birth year]  DOS: the patient was seen and examined on 12/06/2014 PCP:  Melinda Crutch, MD  Chief Complaint:  Shortness of breath  HPI: [name] is a 59 y.o. 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  the patient is presenting with compressive shortness of breath that suddenly started this morning. He mentions he was at his baseline yesterday. Pt denies any fever, chills, headache, runny nose, neck pain, choking episodes, cough, rash anywhere,  chest pain, palpitation, orthopnea, PND,  nausea, vomiting, diarrhea, constipation, abdominal pain, acid reflux, active bleeding, black color BM,  burning urination, increase urinary frequency,  fall, trauma or injury, dizziness, pedal edema, difficulty swallowing, focal neurological deficit.   he denies any recent travel or immobilization or recent flight. He denies any recent leg swelling or leg tenderness.  The patient is coming from home. And at his baseline independent for most of his ADL.  Review of Systems: as mentioned in the history of present illness.  A comprehensive review of the other systems is negative.  Past Medical History  Diagnosis Date  . Pneumonia   . History of radiation therapy 01/28/08-03/11/08    left base tongue  . History of chemotherapy     Cisplatin/Taxotere,5-FU  . Alopecia     s/p chemotherapy  . 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 Seborrheic keratosis     multiple on back  . Diabetes mellitus without complication     takes Amaryl,Januvia,and Metformin,daily  . Hyperlipidemia     takes Vytorin daily  . Hypothyroidism     takes Synthroid daily  . DVT, lower extremity 2009    side effect from chemo and has blood clot left leg-wears compression stockings;was on Coumadin for  16months and has been off 5 yrs  . Neuropathy     left foot  . 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 Arthritis   . Joint pain   . Joint swelling   . Bursitis   . History of shingles    Past Surgical History  Procedure Laterality Date  . Knee surgery Left 1993  . Pilonidal cyst / sinus excision  1974  . Right wrist fracture Right 2004  . Teeth extraction #9    . Port-a-cath removal  2010  . Mouth surgery    . Feeding tube placed  2009 and then removed  . Cardiac catheterization    . Port a cath placement    . Shoulder arthroscopy with rotator cuff repair and subacromial decompression Left 07/16/2014    Procedure: LEFT SHOULDER ARTHROSCOPY WITH ROTATOR CUFF REPAIR AND SUBACROMIAL DECOMPRESSION/DISTAL CLAVICLE RESECTION;  Surgeon: Marin Shutter, MD;  Location: [facility];  Service: Orthopedics;  Laterality: Left;   Social History:  reports that he has never smoked. He has never used smokeless tobacco. He reports that he drinks alcohol. He reports that he does not use illicit drugs.  No Known Allergies  Family History  Problem Relation Age of Onset  . Cancer Mother     uterine  . Diabetes Brother     Prior to Admission medications   Medication Sig Start Date End Date Taking? Authorizing Provider  Canagliflozin (INVOKANA PO) Take 1 tablet by mouth daily.   Yes Historical Provider, MD  ezetimibe-simvastatin (  VYTORIN) 10-20 MG per tablet Take 1 tablet by mouth at bedtime.     Yes Historical Provider, MD  glimepiride (AMARYL) 4 MG tablet Take 4 mg by mouth 2 (two) times daily.    Yes Historical Provider, MD  levothyroxine (SYNTHROID, LEVOTHROID) 75 MCG tablet Take 75 mcg by mouth daily before breakfast.   Yes Historical Provider, MD  metFORMIN (GLUCOPHAGE) 500 MG tablet Take 1,000 mg by mouth 2 (two) times daily with a meal.   Yes Historical Provider, MD  sitaGLIPtin (JANUVIA) 100 MG tablet Take 100 mg by mouth daily.   Yes Historical Provider, MD  diazepam  (VALIUM) 5 MG tablet Take 0.5-1 tablets (2.5-5 mg total) by mouth every 6 (six) hours as needed for muscle spasms or sedation. Patient not taking: Reported on 12/06/2014 07/16/14   Jenetta Loges, PA-C  naproxen (NAPROSYN) 500 MG tablet Take 1 tablet (500 mg total) by mouth 2 (two) times daily with a meal. Patient not taking: Reported on 12/06/2014 07/16/14   Jenetta Loges, PA-C  oxyCODONE-acetaminophen (PERCOCET) 5-325 MG per tablet Take 1-2 tablets by mouth every 4 (four) hours as needed. Patient not taking: Reported on 12/06/2014 07/16/14   Jenetta Loges, PA-C    Physical Exam: Filed Vitals:   12/06/14 2215 12/06/14 2230 12/06/14 2245 12/06/14 2300  BP: 103/62 117/71 125/86 130/70  Pulse: 90 89 98 93  Temp:      TempSrc:      Resp: 19 23 17 25   Height:      Weight:      SpO2: 96% 98% 95% 97%    General: Alert, Awake and Oriented to Time, Place and Person. Appear in mild distress Eyes: PERRL ENT: Oral Mucosa clear moist. Neck: no JVD Cardiovascular: S1 and S2 Present, no Murmur, Peripheral Pulses Present Respiratory: Bilateral Air entry equal and Decreased,  Clear to Auscultation, noCrackles, no wheezes Abdomen: Bowel Sound  present, Soft and non tender Skin: no Rash Extremities: no Pedal edema, no calf tenderness Neurologic: Grossly no focal neuro deficit.  Labs on Admission:  CBC:  Recent Labs Lab 12/06/14 1726  WBC 7.8  HGB 15.7  HCT 46.8  MCV 85.1  PLT 178    CMP     Component Value Date/Time   NA 136 12/06/2014 1726   NA 136 11/18/2012 1331   NA 144 10/11/2010 1222   K 4.2 12/06/2014 1726   K 4.7 11/18/2012 1331   K 4.8* 10/11/2010 1222   CL 103 12/06/2014 1726   CL 98 11/18/2012 1331   CL 103 10/11/2010 1222   CO2 21 12/06/2014 1726   CO2 27 11/18/2012 1331   CO2 29 10/11/2010 1222   GLUCOSE 242* 12/06/2014 1726   GLUCOSE 612 Repeated and Verified* 11/18/2012 1331   GLUCOSE 127* 10/11/2010 1222   BUN 21 12/06/2014 1726   BUN 24.6 11/18/2012 1331    BUN 21 10/11/2010 1222   CREATININE 1.27 12/06/2014 1726   CREATININE 1.7* 11/18/2012 1331   CREATININE 1.1 10/11/2010 1222   CALCIUM 9.7 12/06/2014 1726   CALCIUM 9.9 11/18/2012 1331   CALCIUM 9.1 10/11/2010 1222   PROT 7.2 07/10/2014 1229   PROT 7.5 11/18/2012 1331   PROT 7.1 10/11/2010 1222   ALBUMIN 4.2 07/10/2014 1229   ALBUMIN 3.9 11/18/2012 1331   AST 15 07/10/2014 1229   AST 15 11/18/2012 1331   AST 27 10/11/2010 1222   ALT 20 07/10/2014 1229   ALT 23 11/18/2012 1331   ALT 28 10/11/2010 1222  ALKPHOS 86 07/10/2014 1229   ALKPHOS 117 11/18/2012 1331   ALKPHOS 82 10/11/2010 1222   BILITOT 0.6 07/10/2014 1229   BILITOT 0.59 11/18/2012 1331   BILITOT 0.50 10/11/2010 1222   GFRNONAA 59* 12/06/2014 1726   GFRAA 69* 12/06/2014 1726    No results for input(s): LIPASE, AMYLASE in the last 168 hours.  No results for input(s): CKTOTAL, CKMB, CKMBINDEX, TROPONINI in the last 168 hours. BNP (last 3 results) No results for input(s): BNP in the last 8760 hours.  ProBNP (last 3 results) No results for input(s): PROBNP in the last 8760 hours.   Radiological Exams on Admission: Dg Chest 2 View (if Patient Has Fever And/or Copd)  12/06/2014   CLINICAL DATA:  Shortness of breath bowel ambulating today  EXAM: CHEST  2 VIEW  COMPARISON:  11/18/2012  FINDINGS: The heart size and mediastinal contours are within normal limits. Both lungs are clear. The visualized skeletal structures are unremarkable. Mild prominence of the pulmonary arteries bilaterally with tapering is identified which could represent superimposition of vascular structures although pulmonary arterial hypertension could appear similar. This and prominence of the left first and right second costochondral junctions is reidentified.  IMPRESSION: No active cardiopulmonary disease.   Electronically Signed   By: Conchita Paris M.D.   On: 12/06/2014 18:12   Ct Angio Chest Pe W/cm &/or Wo Cm  12/06/2014   CLINICAL DATA:  Shortness  of breath.  History of DVTs in 2009.  EXAM: CT ANGIOGRAPHY CHEST WITH CONTRAST  TECHNIQUE: Multidetector CT imaging of the chest was performed using the standard protocol during bolus administration of intravenous contrast. Multiplanar CT image reconstructions and MIPs were obtained to evaluate the vascular anatomy.  CONTRAST:  62mL OMNIPAQUE IOHEXOL 350 MG/ML SOLN  COMPARISON:  10/11/2010  FINDINGS: Technically adequate study with good opacification of the central and segmental pulmonary arteries. Examination is positive for multiple pulmonary emboli. Filling defects are demonstrated in the distal right main pulmonary artery extending into multiple upper and lower lobe segmental arteries. Filling defects are also demonstrated in multiple left lower lobe segmental arteries. RV to LV ratio calculates to 2.7.  Normal heart size. Normal caliber thoracic aorta. No aortic dissection. Great vessel origins are patent. Esophagus is decompressed. No significant lymphadenopathy in the chest. No focal airspace disease or consolidation in the lungs. Mild atelectasis in the lung bases. No evidence of pulmonary infarct. No pleural effusions. No pneumothorax.  Included portions of the upper abdominal organs are grossly unremarkable. Degenerative changes in the spine.  Review of the MIP images confirms the above findings.  IMPRESSION: Positive study for multiple bilateral pulmonary emboli. Increased RV to LV ratio. Positive for acute PE with CT evidence of right heart strain (RV/LV Ratio = 2.7) consistent with at least submassive (intermediate risk) PE. The presence of right heart strain has been associated with an increased risk of morbidity and mortality. Please activate Code PE by paging [phone].  These results were called by telephone at the time of interpretation on 12/06/2014 at 9:33 pm to Dr. Alfonzo Beers , who verbally acknowledged these results.   Electronically Signed   By: Lucienne Capers M.D.   On: 12/06/2014 21:35    Assessment/Plan Principal Problem:   Acute pulmonary embolism Active Problems:   Malignant neoplasm of base of tongue   Diabetes mellitus type 2 in nonobese   1. Acute pulmonary embolism  the patient is presenting with compressive shortness of breath. Workup shows that normal chest x-ray and CT scan  chest which is positive for submassive pulmonary embolism with right heart strain. Currently she will be monitored in telemetry. We will be continued on heparin. Echogram in the morning.  patient at present is preferring new or oral anticoagulant. We will continue close monitoring.  2. Malignant neoplasm of the tongue. He has complete remission. Continue close observation.  3. Diabetes mellitus type 2. Patient is on multiple oral hypoglycemic agent. At present with placing him on sliding scale.  4. Hypothyroidism. Continue Synthroid.  Advance goals of care discussion:  Full code   Consults:  Phone consultation with critical care  on chronic therapeutic anticoagulation. Nutrition:  Diabetic diet  Family Communication: family was present at bedside, opportunity was given to ask question and all questions were answered satisfactorily at the time of interview. Disposition: Admitted as inpatient, telemetry unit.  Author: Berle Mull, MD [facility] Pager: [phone] 12/06/2014  If 7PM-7AM, please contact night-coverage [email or web address] Password [email or web address]

## 2014-12-06 NOTE — ED Provider Notes (Signed)
CSN: [id number]     Arrival date & time 12/06/14  1708 History   First MD Initiated Contact with Patient 12/06/14 1820     Chief Complaint  Patient presents with  . Shortness of Breath     (Consider location/radiation/quality/duration/timing/severity/associated sxs/prior Treatment) HPI  Pt with hx squamous cell cancer- s/p treatment, DVT and PE- not currently on anticoagulation presenting with shortness of breath.  He states that shortness of breath started this mornign when he woke up. No chest pain.  SOB worse with exertion.  No leg swelling different from his baseline.  No fever/chiill, no cough.  No fainting.  Symptoms are constant and worsenining throughout the day.  There are no other associated systemic symptoms, there are no other alleviating or modifying factors.   Past Medical History  Diagnosis Date  . Pneumonia   . History of radiation therapy 01/28/08-03/11/08    left base tongue  . History of chemotherapy     Cisplatin/Taxotere,5-FU  . Alopecia     s/p chemotherapy  . 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 Seborrheic keratosis     multiple on back  . Diabetes mellitus without complication     takes Amaryl,Januvia,and Metformin,daily  . Hyperlipidemia     takes Vytorin daily  . Hypothyroidism     takes Synthroid daily  . DVT, lower extremity 2009    side effect from chemo and has blood clot left leg-wears compression stockings;was on Coumadin for 57months and has been off 5 yrs  . Neuropathy     left foot  . 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 Arthritis   . Joint pain   . Joint swelling   . Bursitis   . History of shingles    Past Surgical History  Procedure Laterality Date  . Knee surgery Left 1993  . Pilonidal cyst / sinus excision  1974  . Right wrist fracture Right 2004  . Teeth extraction #9    . Port-a-cath removal  2010  . Mouth surgery    . Feeding tube placed  2009 and then removed  . Cardiac  catheterization    . Port a cath placement    . Shoulder arthroscopy with rotator cuff repair and subacromial decompression Left 07/16/2014    Procedure: LEFT SHOULDER ARTHROSCOPY WITH ROTATOR CUFF REPAIR AND SUBACROMIAL DECOMPRESSION/DISTAL CLAVICLE RESECTION;  Surgeon: Marin Shutter, MD;  Location: [place];  Service: Orthopedics;  Laterality: Left;   Family History  Problem Relation Age of Onset  . Cancer Mother     uterine  . Diabetes Brother    History  Substance Use Topics  . Smoking status: Never Smoker   . Smokeless tobacco: Never Used  . Alcohol Use: Yes     Comment: occasional drink     Review of Systems  ROS reviewed and all otherwise negative except for mentioned in HPI    Allergies  Review of patient's allergies indicates no known allergies.  Home Medications   Prior to Admission medications   Medication Sig Start Date End Date Taking? Authorizing Provider  Canagliflozin (INVOKANA PO) Take 1 tablet by mouth daily.   Yes Historical Provider, MD  ezetimibe-simvastatin (VYTORIN) 10-20 MG per tablet Take 1 tablet by mouth at bedtime.     Yes Historical Provider, MD  glimepiride (AMARYL) 4 MG tablet Take 4 mg by mouth 2 (two) times daily.    Yes Historical Provider, MD  levothyroxine (SYNTHROID, LEVOTHROID)  75 MCG tablet Take 75 mcg by mouth daily before breakfast.   Yes Historical Provider, MD  metFORMIN (GLUCOPHAGE) 500 MG tablet Take 1,000 mg by mouth 2 (two) times daily with a meal.   Yes Historical Provider, MD  sitaGLIPtin (JANUVIA) 100 MG tablet Take 100 mg by mouth daily.   Yes Historical Provider, MD  diazepam (VALIUM) 5 MG tablet Take 0.5-1 tablets (2.5-5 mg total) by mouth every 6 (six) hours as needed for muscle spasms or sedation. Patient not taking: Reported on 12/06/2014 07/16/14   Jenetta Loges, PA-C  naproxen (NAPROSYN) 500 MG tablet Take 1 tablet (500 mg total) by mouth 2 (two) times daily with a meal. Patient not taking: Reported on 12/06/2014 07/16/14    Jenetta Loges, PA-C  oxyCODONE-acetaminophen (PERCOCET) 5-325 MG per tablet Take 1-2 tablets by mouth every 4 (four) hours as needed. Patient not taking: Reported on 12/06/2014 07/16/14   Olivia Mackie Shuford, PA-C   BP 130/70 mmHg  Pulse 93  Temp(Src) 97.4 F (36.3 C) (Oral)  Resp 25  Ht 6\' 3"  (1.905 m)  Wt 222 lb 9 oz (100.954 kg)  BMI 27.82 kg/m2  SpO2 97%  Vitals reviewed Physical Exam  Physical Examination: General appearance - alert, well appearing, and in no distress Mental status - alert, oriented to person, place, and time Eyes - no conjunctival injection, no scleral icterus Mouth - mucous membranes moist, pharynx normal without lesions Chest - clear to auscultation, no wheezes, rales or rhonchi, symmetric air entry Heart - normal rate, regular rhythm, normal S1, S2, no murmurs, rubs, clicks or gallops Abdomen - soft, nontender, nondistended, no masses or organomegaly Extremities - peripheral pulses normal, no pedal edema, no clubbing or cyanosis Skin - normal coloration and turgor, no rashes  ED Course  Procedures (including critical care time)  9:48 PM due to radiiology read of submassive PE and recommendation of activating Code PE- CCM was paged,  They state patient will need hospitalist admission and echo.    10:17 PM d/w Dr. Posey Pronto, [facility] for [facility].  Pt to go to inpatient telemetry bed.  Labs Review Labs Reviewed  BASIC METABOLIC PANEL - Abnormal; Notable for the following:    Glucose, Bld 242 (*)    GFR calc non Af Amer 59 (*)    GFR calc Af Amer 69 (*)    All other components within normal limits  D-DIMER, QUANTITATIVE - Abnormal; Notable for the following:    D-Dimer, Quant 3.14 (*)    All other components within normal limits  CBC  HEPARIN LEVEL (UNFRACTIONATED)  CBC  I-STAT TROPOININ, ED    Imaging Review Dg Chest 2 View (if Patient Has Fever And/or Copd)  12/06/2014   CLINICAL DATA:  Shortness of breath bowel ambulating today  EXAM: CHEST  2 VIEW   COMPARISON:  11/18/2012  FINDINGS: The heart size and mediastinal contours are within normal limits. Both lungs are clear. The visualized skeletal structures are unremarkable. Mild prominence of the pulmonary arteries bilaterally with tapering is identified which could represent superimposition of vascular structures although pulmonary arterial hypertension could appear similar. This and prominence of the left first and right second costochondral junctions is reidentified.  IMPRESSION: No active cardiopulmonary disease.   Electronically Signed   By: Conchita Paris M.D.   On: 12/06/2014 18:12   Ct Angio Chest Pe W/cm &/or Wo Cm  12/06/2014   CLINICAL DATA:  Shortness of breath.  History of DVTs in 2009.  EXAM: CT ANGIOGRAPHY CHEST WITH CONTRAST  TECHNIQUE: Multidetector CT imaging of the chest was performed using the standard protocol during bolus administration of intravenous contrast. Multiplanar CT image reconstructions and MIPs were obtained to evaluate the vascular anatomy.  CONTRAST:  31mL OMNIPAQUE IOHEXOL 350 MG/ML SOLN  COMPARISON:  10/11/2010  FINDINGS: Technically adequate study with good opacification of the central and segmental pulmonary arteries. Examination is positive for multiple pulmonary emboli. Filling defects are demonstrated in the distal right main pulmonary artery extending into multiple upper and lower lobe segmental arteries. Filling defects are also demonstrated in multiple left lower lobe segmental arteries. RV to LV ratio calculates to 2.7.  Normal heart size. Normal caliber thoracic aorta. No aortic dissection. Great vessel origins are patent. Esophagus is decompressed. No significant lymphadenopathy in the chest. No focal airspace disease or consolidation in the lungs. Mild atelectasis in the lung bases. No evidence of pulmonary infarct. No pleural effusions. No pneumothorax.  Included portions of the upper abdominal organs are grossly unremarkable. Degenerative changes in the  spine.  Review of the MIP images confirms the above findings.  IMPRESSION: Positive study for multiple bilateral pulmonary emboli. Increased RV to LV ratio. Positive for acute PE with CT evidence of right heart strain (RV/LV Ratio = 2.7) consistent with at least submassive (intermediate risk) PE. The presence of right heart strain has been associated with an increased risk of morbidity and mortality. Please activate Code PE by paging [phone].  These results were called by telephone at the time of interpretation on 12/06/2014 at 9:33 pm to Dr. Alfonzo Beers , who verbally acknowledged these results.   Electronically Signed   By: Lucienne Capers M.D.   On: 12/06/2014 21:35     EKG Interpretation   Date/Time:  Sunday December 06 2014 17:16:39 EDT Ventricular Rate:  102 PR Interval:  206 QRS Duration: 78 QT Interval:  346 QTC Calculation: 450 R Axis:   51 Text Interpretation:  Sinus tachycardia Possible Left atrial enlargement  Borderline ECG nonspecific ST changes- less prominent compared to prior  ekg Confirmed by [facility]  MD, Shanine Kreiger [phone]) on 12/06/2014 6:45:01 PM      MDM   Final diagnoses:  Shortness of breath  Pulmonary embolism  Pt presneting with shortness of breath- EKG reassuring, he is mildly hypoxic- O2 per [place] applied.  D-dimer elevated, CT angio shows PE- see notes above.  Pt started on heparin and admitted to medicine for further management.      Alfonzo Beers, MD 12/07/14 0010

## 2014-12-06 NOTE — ED Notes (Signed)
Spoke with CT they are on the way to get patient right now.

## 2014-12-06 NOTE — ED Notes (Signed)
IV team at bedside 

## 2014-12-06 NOTE — ED Notes (Signed)
Pt reports shortness of breath onset this morning while getting out of bed.

## 2014-12-06 NOTE — ED Notes (Signed)
2 Nurses attempted IV IV team consulted.

## 2014-12-06 NOTE — ED Notes (Signed)
CT notfied patient has IV and ready to go.

## 2014-12-06 NOTE — Progress Notes (Signed)
ANTICOAGULATION CONSULT NOTE - Initial Consult  Pharmacy Consult for heparin Indication: pulmonary embolus  No Known Allergies  Patient Measurements: Height: 6\' 3"  (190.5 cm) Weight: 222 lb 9 oz (100.954 kg) IBW/kg (Calculated) : 84.5 Heparin Dosing Weight: 94kg  Vital Signs: Temp: 97.4 F (36.3 C) (04/03 1713) Temp Source: Oral (04/03 1713) BP: 100/67 mmHg (04/03 2045) Pulse Rate: 91 (04/03 2045)  Labs:  Recent Labs  12/06/14 1726  HGB 15.7  HCT 46.8  PLT 178  CREATININE 1.27    Estimated Creatinine Clearance: 73 mL/min (by C-G formula based on Cr of 1.27).   Medical History: Past Medical History  Diagnosis Date  . Pneumonia   . History of radiation therapy 01/28/08-03/11/08    left base tongue  . History of chemotherapy     Cisplatin/Taxotere,5-FU  . Alopecia     s/p chemotherapy  . 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 Seborrheic keratosis     multiple on back  . Diabetes mellitus without complication     takes Amaryl,Januvia,and Metformin,daily  . Hyperlipidemia     takes Vytorin daily  . Hypothyroidism     takes Synthroid daily  . DVT, lower extremity 2009    side effect from chemo and has blood clot left leg-wears compression stockings;was on Coumadin for 8months and has been off 5 yrs  . Neuropathy     left foot  . 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 Arthritis   . Joint pain   . Joint swelling   . Bursitis   . History of shingles      Assessment: 59 year old male presents to [facility] with shortness of breath. Chest CT shows multiple bilateral PE with evidence of right heart strain. New orders to start IV heparin. Patient has history of DVT in past but not on any anticoagulation prior to admission. CBC appears normal.  Goal of Therapy:  Heparin level 0.3-0.7 units/ml Monitor platelets by anticoagulation protocol: Yes   Plan:  Give 5500 units bolus x 1 Start heparin infusion at 1500  units/hr Check anti-Xa level in 6 hours and daily while on heparin Continue to monitor H&H and platelets  Erin Hearing PharmD., BCPS Clinical Pharmacist Pager [phone] 12/06/2014 9:45 PM

## 2014-12-07 ENCOUNTER — Ambulatory Visit: Payer: Self-pay

## 2014-12-07 ENCOUNTER — Encounter (HOSPITAL_COMMUNITY): Payer: Self-pay | Admitting: General Practice

## 2014-12-07 DIAGNOSIS — I2699 Other pulmonary embolism without acute cor pulmonale: Secondary | ICD-10-CM

## 2014-12-07 DIAGNOSIS — E119 Type 2 diabetes mellitus without complications: Secondary | ICD-10-CM

## 2014-12-07 HISTORY — DX: Other pulmonary embolism without acute cor pulmonale: I26.99

## 2014-12-07 LAB — CBC
HCT: 42.8 % (ref 39.0–52.0)
Hemoglobin: 14.5 g/dL (ref 13.0–17.0)
MCH: 28.7 pg (ref 26.0–34.0)
MCHC: 33.9 g/dL (ref 30.0–36.0)
MCV: 84.6 fL (ref 78.0–100.0)
PLATELETS: 157 10*3/uL (ref 150–400)
RBC: 5.06 MIL/uL (ref 4.22–5.81)
RDW: 13.1 % (ref 11.5–15.5)
WBC: 7.1 10*3/uL (ref 4.0–10.5)

## 2014-12-07 LAB — COMPREHENSIVE METABOLIC PANEL
ALT: 16 U/L (ref 0–53)
AST: 17 U/L (ref 0–37)
Albumin: 3.7 g/dL (ref 3.5–5.2)
Alkaline Phosphatase: 89 U/L (ref 39–117)
Anion gap: 7 (ref 5–15)
BILIRUBIN TOTAL: 0.7 mg/dL (ref 0.3–1.2)
BUN: 19 mg/dL (ref 6–23)
CHLORIDE: 105 mmol/L (ref 96–112)
CO2: 26 mmol/L (ref 19–32)
CREATININE: 1.22 mg/dL (ref 0.50–1.35)
Calcium: 9 mg/dL (ref 8.4–10.5)
GFR calc Af Amer: 72 mL/min — ABNORMAL LOW (ref >90)
GFR, EST NON AFRICAN AMERICAN: 62 mL/min — AB (ref >90)
Glucose, Bld: 264 mg/dL — ABNORMAL HIGH (ref 70–99)
Potassium: 4 mmol/L (ref 3.5–5.1)
SODIUM: 138 mmol/L (ref 135–145)
Total Protein: 5.9 g/dL — ABNORMAL LOW (ref 6.0–8.3)

## 2014-12-07 LAB — HEPARIN LEVEL (UNFRACTIONATED)
HEPARIN UNFRACTIONATED: 0.29 [IU]/mL — AB (ref 0.30–0.70)
Heparin Unfractionated: 0.39 IU/mL (ref 0.30–0.70)
Heparin Unfractionated: 0.26 IU/mL — ABNORMAL LOW (ref 0.30–0.70)

## 2014-12-07 LAB — GLUCOSE, CAPILLARY
Glucose-Capillary: 167 mg/dL — ABNORMAL HIGH (ref 70–99)
Glucose-Capillary: 162 mg/dL — ABNORMAL HIGH (ref 70–99)

## 2014-12-07 LAB — CBG MONITORING, ED
Glucose-Capillary: 286 mg/dL — ABNORMAL HIGH (ref 70–99)
Glucose-Capillary: 184 mg/dL — ABNORMAL HIGH (ref 70–99)
Glucose-Capillary: 219 mg/dL — ABNORMAL HIGH (ref 70–99)

## 2014-12-07 LAB — PROTIME-INR
INR: 1.19 (ref 0.00–1.49)
Prothrombin Time: 15.2 seconds (ref 11.6–15.2)

## 2014-12-07 MED ORDER — INSULIN ASPART 100 UNIT/ML ~~LOC~~ SOLN
0.0000 [IU] | Freq: Three times a day (TID) | SUBCUTANEOUS | Status: DC
  Administered 2014-12-07: 3 [IU] via SUBCUTANEOUS
  Administered 2014-12-07: 5 [IU] via SUBCUTANEOUS
  Administered 2014-12-07 – 2014-12-08 (×3): 3 [IU] via SUBCUTANEOUS
  Administered 2014-12-08: 5 [IU] via SUBCUTANEOUS
  Administered 2014-12-09: 3 [IU] via SUBCUTANEOUS
  Administered 2014-12-09: 5 [IU] via SUBCUTANEOUS
  Administered 2014-12-09: 3 [IU] via SUBCUTANEOUS
  Administered 2014-12-10: 5 [IU] via SUBCUTANEOUS
  Administered 2014-12-10: 8 [IU] via SUBCUTANEOUS
  Filled 2014-12-07 (×2): qty 1

## 2014-12-07 MED ORDER — ACETAMINOPHEN 650 MG RE SUPP: 650.0000 mg | Freq: Four times a day (QID) | RECTAL | Status: DC | PRN

## 2014-12-07 MED ORDER — EZETIMIBE-SIMVASTATIN 10-20 MG PO TABS
1.0000 | ORAL_TABLET | Freq: Every day | ORAL | Status: DC
  Administered 2014-12-07 – 2014-12-09 (×4): 1 via ORAL
  Filled 2014-12-07 (×6): qty 1

## 2014-12-07 MED ORDER — INSULIN ASPART 100 UNIT/ML ~~LOC~~ SOLN
0.0000 [IU] | Freq: Every day | SUBCUTANEOUS | Status: DC
  Administered 2014-12-07: 3 [IU] via SUBCUTANEOUS
  Administered 2014-12-08 – 2014-12-09 (×2): 2 [IU] via SUBCUTANEOUS
  Filled 2014-12-07: qty 1

## 2014-12-07 MED ORDER — ONDANSETRON HCL 4 MG/2ML IJ SOLN: 4.0000 mg | Freq: Four times a day (QID) | INTRAMUSCULAR | Status: DC | PRN

## 2014-12-07 MED ORDER — ONDANSETRON HCL 4 MG PO TABS: 4.0000 mg | ORAL_TABLET | Freq: Four times a day (QID) | ORAL | Status: DC | PRN

## 2014-12-07 MED ORDER — ACETAMINOPHEN 325 MG PO TABS: 650.0000 mg | ORAL_TABLET | Freq: Four times a day (QID) | ORAL | Status: DC | PRN

## 2014-12-07 MED ORDER — LEVOTHYROXINE SODIUM 75 MCG PO TABS
75.0000 ug | ORAL_TABLET | Freq: Every day | ORAL | Status: DC
  Administered 2014-12-07 – 2014-12-10 (×4): 75 ug via ORAL
  Filled 2014-12-07 (×6): qty 1

## 2014-12-07 MED ORDER — SODIUM CHLORIDE 0.9 % IJ SOLN
3.0000 mL | Freq: Two times a day (BID) | INTRAMUSCULAR | Status: DC
  Administered 2014-12-07 – 2014-12-10 (×5): 3 mL via INTRAVENOUS

## 2014-12-07 NOTE — Progress Notes (Signed)
ANTICOAGULATION CONSULT NOTE - Initial Consult  Pharmacy Consult for heparin Indication: pulmonary embolus  No Known Allergies  Patient Measurements: Height: 6\' 3"  (190.5 cm) Weight: 222 lb 9 oz (100.954 kg) IBW/kg (Calculated) : 84.5 Heparin Dosing Weight: 94kg  Vital Signs: Temp: 97.4 F (36.3 C) (04/03 1713) Temp Source: Oral (04/03 1713) BP: 116/71 mmHg (04/04 0200) Pulse Rate: 88 (04/04 0200)  Labs:  Recent Labs  12/06/14 1726 12/07/14 0200  HGB 15.7 14.5  HCT 46.8 42.8  PLT 178 157  LABPROT  --  15.2  INR  --  1.19  HEPARINUNFRC  --  0.39  CREATININE 1.27 1.22    Estimated Creatinine Clearance: 76 mL/min (by C-G formula based on Cr of 1.22).   Medical History: Past Medical History  Diagnosis Date  . Pneumonia   . History of radiation therapy 01/28/08-03/11/08    left base tongue  . History of chemotherapy     Cisplatin/Taxotere,5-FU  . Alopecia     s/p chemotherapy  . 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 Seborrheic keratosis     multiple on back  . Diabetes mellitus without complication     takes Amaryl,Januvia,and Metformin,daily  . Hyperlipidemia     takes Vytorin daily  . Hypothyroidism     takes Synthroid daily  . DVT, lower extremity 2009    side effect from chemo and has blood clot left leg-wears compression stockings;was on Coumadin for 70months and has been off 5 yrs  . Neuropathy     left foot  . 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 Arthritis   . Joint pain   . Joint swelling   . Bursitis   . History of shingles      Assessment: 59 year old male presents to [facility] with shortness of breath. Chest CT shows multiple bilateral PE with evidence of right heart strain. New orders to start IV heparin. Patient has history of DVT in past but not on any anticoagulation prior to admission. CBC appears normal.  Heparin level is therapeutic at 0.39 on heparin 1500 units/hr. No bleeding is noted.    Goal of Therapy:  Heparin level 0.3-0.7 units/ml Monitor platelets by anticoagulation protocol: Yes   Plan:  Continue heparin 1500 units/hr Check anti-Xa level in 6 hours and daily while on heparin Continue to monitor H&H and platelets F/u on long-term anticoag plans  Andrey Cota. Diona Foley, PharmD Clinical Pharmacist Pager [phone]  12/07/2014 3:03 AM

## 2014-12-07 NOTE — Progress Notes (Signed)
ANTICOAGULATION CONSULT NOTE - Follow-up Consult  Pharmacy Consult for heparin Indication: pulmonary embolus  No Known Allergies  Patient Measurements: Height: 6\' 3"  (190.5 cm) Weight: 222 lb 9 oz (100.954 kg) IBW/kg (Calculated) : 84.5 Heparin Dosing Weight: 94kg  Vital Signs: Temp: 97.6 F (36.4 C) (04/04 0630) Temp Source: Oral (04/04 0630) BP: 109/64 mmHg (04/04 1000) Pulse Rate: 82 (04/04 1000)  Labs:  Recent Labs  12/06/14 1726 12/07/14 0200 12/07/14 0920  HGB 15.7 14.5  --   HCT 46.8 42.8  --   PLT 178 157  --   LABPROT  --  15.2  --   INR  --  1.19  --   HEPARINUNFRC  --  0.39 0.26*  CREATININE 1.27 1.22  --     Estimated Creatinine Clearance: 76 mL/min (by C-G formula based on Cr of 1.22).  Assessment: 59 year old male presents to [facility] with shortness of breath. Chest CT shows multiple bilateral PE with evidence of right heart strain. Pt on IV heparin. Patient has history of DVT in past but not on any anticoagulation prior to admission. CBC appears normal.  Heparin level down to 0.26 (subtherapeutic -would like to be in upper goal range with PE) - no issues with line or bleeding per RN.  Goal of Therapy:  Heparin level 0.3-0.7 units/ml Monitor platelets by anticoagulation protocol: Yes   Plan:  Increase heparin to 1750 units/hr Check anti-Xa level in 6 hours  Daily heparin level and CBC F/u on long-term anticoag plans  Sherlon Handing, PharmD, BCPS Clinical pharmacist, pager [phone] 12/07/2014 10:58 AM

## 2014-12-07 NOTE — Progress Notes (Signed)
ANTICOAGULATION CONSULT NOTE - Follow-up Consult  Pharmacy Consult for Heparin Indication: pulmonary embolus  No Known Allergies  Patient Measurements: Height: 6\' 3"  (190.5 cm) Weight: 220 lb 1.6 oz (99.837 kg) IBW/kg (Calculated) : 84.5 Heparin Dosing Weight: 94kg  Vital Signs: Temp: 97.6 F (36.4 C) (04/04 1350) BP: 115/79 mmHg (04/04 1350) Pulse Rate: 89 (04/04 1300)  Labs:  Recent Labs  12/06/14 1726 12/07/14 0200 12/07/14 0920 12/07/14 1840  HGB 15.7 14.5  --   --   HCT 46.8 42.8  --   --   PLT 178 157  --   --   LABPROT  --  15.2  --   --   INR  --  1.19  --   --   HEPARINUNFRC  --  0.39 0.26* 0.29*  CREATININE 1.27 1.22  --   --     Estimated Creatinine Clearance: 76 mL/min (by C-G formula based on Cr of 1.22).  Assessment: 4 YOM who presented to [facility] on 4/3 with SOB. Chest CT shows multiple bilateral PE with evidence of right heart strain. Patient has history of DVT in past but not on any anticoagulation prior to admission. CBC appears normal. Pharmacy was consulted to dose heparin for anticoagulation.   Heparin level this evening remains SUBtherapeutic despite a rate increase this morning (HL 0.29 << 0.26, goal of 0.3-0.7) . CBC from this morning was stable. No issues with the heparin drip or overt s/sx of bleeding noted per RN report.  Goal of Therapy:  Heparin level 0.3-0.7 units/ml Monitor platelets by anticoagulation protocol: Yes   Plan:  1. Increase heparin to 1950 units/hr (19.5 ml/hr) 2. Will continue to monitor for any signs/symptoms of bleeding and will follow up with heparin level in 6 hours   Alycia Rossetti, PharmD, BCPS Clinical Pharmacist Pager: [phone] 12/07/2014 7:47 PM

## 2014-12-07 NOTE — Progress Notes (Signed)
PROGRESS NOTE  [name] [facility]:[id number] DOB: 09-18-1955 DOA: 12/06/2014 PCP:  Melinda Crutch, MD  Assessment/Plan: Acute pulmonary embolism CTA:  submassive pulmonary embolism with right heart strain. Tele  heparin. Echogram  -new agent for lifelong as this is a second PE per patient- 2009 -patient and family worried about recurrent DVT as well Malignant neoplasm of the tongue. He has complete remission. Continue close observation.  Diabetes mellitus type 2. Patient is on multiple oral hypoglycemic agent. At present with placing him on sliding scale.  Hypothyroidism. Continue Synthroid.   Code Status: full Family Communication: patient Disposition Plan:    Consultants:    Procedures:      HPI/Subjective: Still some SOB with activity -says had PE/DVT in 2009  Objective: Filed Vitals:   12/07/14 1000  BP: 109/64  Pulse: 82  Temp:   Resp: 16   No intake or output data in the 24 hours ending 12/07/14 1053 Filed Weights   12/06/14 1713  Weight: 100.954 kg (222 lb 9 oz)    Exam:   General:  A+Ox3, NAD  Cardiovascular: rrr  Respiratory: clear  Abdomen: +BS, soft  Musculoskeletal: no edema   Data Reviewed: Basic Metabolic Panel:  Recent Labs Lab 12/06/14 1726 12/07/14 0200  NA 136 138  K 4.2 4.0  CL 103 105  CO2 21 26  GLUCOSE 242* 264*  BUN 21 19  CREATININE 1.27 1.22  CALCIUM 9.7 9.0   Liver Function Tests:  Recent Labs Lab 12/07/14 0200  AST 17  ALT 16  ALKPHOS 89  BILITOT 0.7  PROT 5.9*  ALBUMIN 3.7   No results for input(s): LIPASE, AMYLASE in the last 168 hours. No results for input(s): AMMONIA in the last 168 hours. CBC:  Recent Labs Lab 12/06/14 1726 12/07/14 0200  WBC 7.8 7.1  HGB 15.7 14.5  HCT 46.8 42.8  MCV 85.1 84.6  PLT 178 157   Cardiac Enzymes: No results for input(s): CKTOTAL, CKMB, CKMBINDEX, TROPONINI in the last 168 hours. BNP (last 3 results) No results for input(s): BNP in the last 8760  hours.  ProBNP (last 3 results) No results for input(s): PROBNP in the last 8760 hours.  CBG:  Recent Labs Lab 12/07/14 0114 12/07/14 0632  GLUCAP 286* 184*    No results found for this or any previous visit (from the past 240 hour(s)).   Studies: Dg Chest 2 View (if Patient Has Fever And/or Copd)  12/06/2014   CLINICAL DATA:  Shortness of breath bowel ambulating today  EXAM: CHEST  2 VIEW  COMPARISON:  11/18/2012  FINDINGS: The heart size and mediastinal contours are within normal limits. Both lungs are clear. The visualized skeletal structures are unremarkable. Mild prominence of the pulmonary arteries bilaterally with tapering is identified which could represent superimposition of vascular structures although pulmonary arterial hypertension could appear similar. This and prominence of the left first and right second costochondral junctions is reidentified.  IMPRESSION: No active cardiopulmonary disease.   Electronically Signed   By: Conchita Paris M.D.   On: 12/06/2014 18:12   Ct Angio Chest Pe W/cm &/or Wo Cm  12/06/2014   CLINICAL DATA:  Shortness of breath.  History of DVTs in 2009.  EXAM: CT ANGIOGRAPHY CHEST WITH CONTRAST  TECHNIQUE: Multidetector CT imaging of the chest was performed using the standard protocol during bolus administration of intravenous contrast. Multiplanar CT image reconstructions and MIPs were obtained to evaluate the vascular anatomy.  CONTRAST:  52mL OMNIPAQUE IOHEXOL 350 MG/ML SOLN  COMPARISON:  10/11/2010  FINDINGS: Technically adequate study with good opacification of the central and segmental pulmonary arteries. Examination is positive for multiple pulmonary emboli. Filling defects are demonstrated in the distal right main pulmonary artery extending into multiple upper and lower lobe segmental arteries. Filling defects are also demonstrated in multiple left lower lobe segmental arteries. RV to LV ratio calculates to 2.7.  Normal heart size. Normal caliber  thoracic aorta. No aortic dissection. Great vessel origins are patent. Esophagus is decompressed. No significant lymphadenopathy in the chest. No focal airspace disease or consolidation in the lungs. Mild atelectasis in the lung bases. No evidence of pulmonary infarct. No pleural effusions. No pneumothorax.  Included portions of the upper abdominal organs are grossly unremarkable. Degenerative changes in the spine.  Review of the MIP images confirms the above findings.  IMPRESSION: Positive study for multiple bilateral pulmonary emboli. Increased RV to LV ratio. Positive for acute PE with CT evidence of right heart strain (RV/LV Ratio = 2.7) consistent with at least submassive (intermediate risk) PE. The presence of right heart strain has been associated with an increased risk of morbidity and mortality. Please activate Code PE by paging [phone].  These results were called by telephone at the time of interpretation on 12/06/2014 at 9:33 pm to Dr. Alfonzo Beers , who verbally acknowledged these results.   Electronically Signed   By: Lucienne Capers M.D.   On: 12/06/2014 21:35    Scheduled Meds: . ezetimibe-simvastatin  1 tablet Oral QHS  . insulin aspart  0-15 Units Subcutaneous TID WC  . insulin aspart  0-5 Units Subcutaneous QHS  . levothyroxine  75 mcg Oral QAC breakfast  . sodium chloride  3 mL Intravenous Q12H   Continuous Infusions: . heparin 1,500 Units/hr (12/06/14 2231)   Antibiotics Given (last 72 hours)    None      Principal Problem:   Acute pulmonary embolism Active Problems:   Malignant neoplasm of base of tongue   Diabetes mellitus type 2 in nonobese    Time spent: 25 min    Sahaj Bona  [facility] Pager [phone] If 7PM-7AM, please contact night-coverage at [email or web address], password [facility] 12/07/2014, 10:53 AM  LOS: 1 day

## 2014-12-07 NOTE — ED Notes (Signed)
Requested Hospital bed

## 2014-12-07 NOTE — ED Notes (Signed)
Dr. Eliseo Squires at bedside speaking with patient.

## 2014-12-08 DIAGNOSIS — Z86718 Personal history of other venous thrombosis and embolism: Secondary | ICD-10-CM

## 2014-12-08 DIAGNOSIS — R0602 Shortness of breath: Secondary | ICD-10-CM

## 2014-12-08 DIAGNOSIS — I313 Pericardial effusion (noninflammatory): Secondary | ICD-10-CM

## 2014-12-08 DIAGNOSIS — Z86711 Personal history of pulmonary embolism: Secondary | ICD-10-CM

## 2014-12-08 LAB — CBC
HCT: 43.8 % (ref 39.0–52.0)
Hemoglobin: 14.3 g/dL (ref 13.0–17.0)
MCH: 28.1 pg (ref 26.0–34.0)
MCHC: 32.6 g/dL (ref 30.0–36.0)
MCV: 86.2 fL (ref 78.0–100.0)
Platelets: 148 10*3/uL — ABNORMAL LOW (ref 150–400)
RBC: 5.08 MIL/uL (ref 4.22–5.81)
RDW: 13 % (ref 11.5–15.5)
WBC: 6.9 10*3/uL (ref 4.0–10.5)

## 2014-12-08 LAB — HEMOGLOBIN A1C
Hgb A1c MFr Bld: 8.6 % — ABNORMAL HIGH (ref 4.8–5.6)
MEAN PLASMA GLUCOSE: 200 mg/dL

## 2014-12-08 LAB — GLUCOSE, CAPILLARY
GLUCOSE-CAPILLARY: 163 mg/dL — AB (ref 70–99)
Glucose-Capillary: 181 mg/dL — ABNORMAL HIGH (ref 70–99)
Glucose-Capillary: 213 mg/dL — ABNORMAL HIGH (ref 70–99)
Glucose-Capillary: 235 mg/dL — ABNORMAL HIGH (ref 70–99)

## 2014-12-08 LAB — BASIC METABOLIC PANEL
Anion gap: 10 (ref 5–15)
BUN: 18 mg/dL (ref 6–23)
CO2: 24 mmol/L (ref 19–32)
CREATININE: 1.15 mg/dL (ref 0.50–1.35)
Calcium: 9.1 mg/dL (ref 8.4–10.5)
Chloride: 102 mmol/L (ref 96–112)
GFR calc Af Amer: 78 mL/min — ABNORMAL LOW (ref >90)
GFR calc non Af Amer: 67 mL/min — ABNORMAL LOW (ref >90)
GLUCOSE: 161 mg/dL — AB (ref 70–99)
Potassium: 4.3 mmol/L (ref 3.5–5.1)
Sodium: 136 mmol/L (ref 135–145)

## 2014-12-08 LAB — HEPARIN LEVEL (UNFRACTIONATED)
Heparin Unfractionated: 0.3 IU/mL (ref 0.30–0.70)
Heparin Unfractionated: 0.65 IU/mL (ref 0.30–0.70)

## 2014-12-08 NOTE — Progress Notes (Addendum)
ANTICOAGULATION CONSULT NOTE Pharmacy Consult for Heparin Indication: pulmonary embolus  No Known Allergies  Patient Measurements: Height: 6\' 3"  (190.5 cm) Weight: 216 lb 0.8 oz (98 kg) IBW/kg (Calculated) : 84.5 Heparin Dosing Weight: 94kg  Vital Signs: Temp: 98 F (36.7 C) (04/05 0521) Temp Source: Oral (04/05 0521) BP: 105/75 mmHg (04/05 0521) Pulse Rate: 87 (04/05 0521)  Labs:  Recent Labs  12/06/14 1726 12/07/14 0200  12/07/14 1840 12/08/14 0217 12/08/14 1450  HGB 15.7 14.5  --   --  14.3  --   HCT 46.8 42.8  --   --  43.8  --   PLT 178 157  --   --  148*  --   LABPROT  --  15.2  --   --   --   --   INR  --  1.19  --   --   --   --   HEPARINUNFRC  --  0.39  < > 0.29* 0.30 0.65  CREATININE 1.27 1.22  --   --  1.15  --   < > = values in this interval not displayed.  Estimated Creatinine Clearance: 80.6 mL/min (by C-G formula based on Cr of 1.15).  Assessment: 59 YO male with PE on heparin at 2100 units/hr and confirmed at goal (HL= 0.65). Patient also noted with bilateral DVT. Noted plans for Xarelto or Eliquis.   Goal of Therapy:  Heparin level 0.3-0.7 units/ml Monitor platelets by anticoagulation protocol: Yes   Plan:  -No heparin changes needed -Daily heparin level and CBC  Hildred Laser, Pharm D 12/08/2014 4:16 PM

## 2014-12-08 NOTE — Progress Notes (Signed)
Inpatient Diabetes Program Recommendations  AACE/ADA: New Consensus Statement on Inpatient Glycemic Control (2013)  Target Ranges:  Prepandial:   less than 140 mg/dL      Peak postprandial:   less than 180 mg/dL (1-2 hours)      Critically ill patients:  140 - 180 mg/dL    Results for [name], [name] (MRN [id number]) as of 12/08/2014 09:23  Ref. Range 12/07/2014 06:32 12/07/2014 12:39 12/07/2014 16:26 12/07/2014 21:38 12/08/2014 07:57  Glucose-Capillary Latest Range: 70-99 mg/dL 184 (H) 219 (H) 167 (H) 162 (H) 181 (H)    Reason for assessment: elevated CBG and A1C 8.6%  Diabetes history: Type 2 Outpatient Diabetes medications: Invokana 1 tab/day, Amaryl 4mg  bid, Metformin 1000mg  bid, Januvia 100mg /day Current orders for Inpatient glycemic control: Novolog moderate correction 0-15 units tid and 0-5 units qhs  Please consider starting Lantus 10 units q day (0.1 units/kg)- fasting blood sugars elevated, oral meds for glucose control stopped when he came to hospital and A1C 8.6%.   Gentry Fitz, RN, BA, MHA, CDE Diabetes Coordinator Inpatient Diabetes Program  [phone] (Team Pager) [phone] [facility]) 12/08/2014 9:27 AM

## 2014-12-08 NOTE — Progress Notes (Signed)
VASCULAR LAB PRELIMINARY  PRELIMINARY  PRELIMINARY  PRELIMINARY  BLEV completed.    Preliminary report: Positive acute DVT Right distal femoral vein extending through the right popliteal vein into the right peroneal veins.  Positive acute DVT Left common femoral vein extending to the left peroneal veins.  Images of the mid distal IVC demonstrate color flow with phasic venous flow.  August Albino, RVT 12/08/2014, 12:17 PM

## 2014-12-08 NOTE — Progress Notes (Deleted)
VASCULAR LAB PRELIMINARY  PRELIMINARY  PRELIMINARY  PRELIMINARY  BLEV completed.    Preliminary report:  Partial non-occlusive sub-acute DVT left proximal popliteal and segments of bilateral peroneal veins.  August Albino, RVT 12/08/2014, 10:27 AM

## 2014-12-08 NOTE — Progress Notes (Signed)
ANTICOAGULATION CONSULT NOTE Pharmacy Consult for Heparin Indication: pulmonary embolus  No Known Allergies  Patient Measurements: Height: 6\' 3"  (190.5 cm) Weight: 220 lb 1.6 oz (99.837 kg) IBW/kg (Calculated) : 84.5 Heparin Dosing Weight: 94kg  Vital Signs: Temp: 97.6 F (36.4 C) (04/04 2008) Temp Source: Oral (04/04 2008) BP: 113/78 mmHg (04/04 2008) Pulse Rate: 87 (04/04 2008)  Labs:  Recent Labs  12/06/14 1726  12/07/14 0200 12/07/14 0920 12/07/14 1840 12/08/14 0217  HGB 15.7  --  14.5  --   --  14.3  HCT 46.8  --  42.8  --   --  43.8  PLT 178  --  157  --   --  148*  LABPROT  --   --  15.2  --   --   --   INR  --   --  1.19  --   --   --   HEPARINUNFRC  --   < > 0.39 0.26* 0.29* 0.30  CREATININE 1.27  --  1.22  --   --   --   < > = values in this interval not displayed.  Estimated Creatinine Clearance: 76 mL/min (by C-G formula based on Cr of 1.22).  Assessment: 59 YO male with PE for heparin  Goal of Therapy:  Heparin level 0.3-0.7 units/ml Monitor platelets by anticoagulation protocol: Yes   Plan:  Increase Heparin 2100 units/hr to keep well within goal range Check heparin level in 8 hours to verify  Phillis Knack, PharmD, BCPS 12/08/2014 3:41 AM

## 2014-12-08 NOTE — Progress Notes (Signed)
SATURATION QUALIFICATIONS: (This note is used to comply with regulatory documentation for home oxygen)  Patient Saturations on Room Air at Rest = 94%  Patient Saturations on Room Air while Ambulating = 91% (was the lowest reading)   Patient Saturations on NA Liters of oxygen while Ambulating = NA%  Please briefly explain why patient needs home oxygen:

## 2014-12-08 NOTE — Progress Notes (Signed)
*  PRELIMINARY RESULTS* Echocardiogram 2D Echocardiogram has been performed.  Leavy Cella 12/08/2014, 10:33 AM

## 2014-12-08 NOTE — Care Management Note (Unsigned)
    Page 1 of 2   12/09/2014     1:53:43 PM CARE MANAGEMENT NOTE 12/09/2014  Patient:  [name], [name]   Account Number:  [id number]  Date Initiated:  12/08/2014  Documentation initiated by:  GRAVES-BIGELOW,Marquarius Lofton  Subjective/Objective Assessment:   Pt admitted for PE. Initiated on IV heparin.     Action/Plan:   Benefits check in process for Eliquis and Xarelto. CM will make pt aware once completed. CM will provide discount card to pt once check completed.   Anticipated DC Date:  12/10/2014   Anticipated DC Plan:  [place]  CM consult      Choice offered to / List presented to:             Status of service:  In process, will continue to follow Medicare Important Message given?   (If response is "NO", the following Medicare IM given date fields will be blank) Date Medicare IM given:   Medicare IM given by:   Date Additional Medicare IM given:   Additional Medicare IM given by:    Discharge Disposition:    Per UR Regulation:  Reviewed for med. necessity/level of care/duration of stay  If discussed at [place] of Stay Meetings, dates discussed:    Comments:  12-09-14 1314 Jacqlyn Krauss, RN,BSN [phone] Xarelto co pay card to be given to pt before d/c - plan will be for pt to use [facility]. No further needs from CM at this time.  Benefits Check: ELIQUIS 2.5 MG BID DAILY  COVER- YES CO-PAY-$ 75.00  30 DAY SUPPLY TIER- 3 DRUG PRIOR APPROVAL - NO PHARMACY : ANY REATAIL  ELIQUIS 5 MG  ** SAME AS ABOVE **  XARELTO 15 MG  BID DAILY  COVER - YES CO-PAY- $ 25.00 30 DAY SUPPLY TIER- 2 DRUG PRIOR APPROVAL -NO PHARMACY : ANY RETAIL  XARELTO  20 MG  BID DAILY ** SAME AS ABOVE ***

## 2014-12-08 NOTE — Progress Notes (Signed)
PROGRESS NOTE  [name] WSF:[id number] DOB: 1955/12/11 DOA: 12/06/2014 PCP:  Melinda Crutch, MD  Assessment/Plan: Acute pulmonary embolism CTA:  submassive pulmonary embolism with right heart strain. Tele  heparin- elizuis vs xarelto Echogram  -new agent for lifelong as this is a second PE per patient- 2009 Malignant neoplasm of the tongue. He has complete remission. Continue close observation. -duplex  Diabetes mellitus type 2. Patient is on multiple oral hypoglycemic agent. At present with placing him on sliding scale.  Hypothyroidism. Continue Synthroid.  Acute resp failure- wean off O2 as tolerated  Code Status: full Family Communication: patient Disposition Plan: home in AM??  Consultants:    Procedures:      HPI/Subjective: No SOB, no CP Still on 2L   Objective: Filed Vitals:   12/08/14 0521  BP: 105/75  Pulse: 87  Temp: 98 F (36.7 C)  Resp: 16    Intake/Output Summary (Last 24 hours) at 12/08/14 0803 Last data filed at 12/08/14 0521  Gross per 24 hour  Intake    240 ml  Output   1925 ml  Net  -1685 ml   Filed Weights   12/06/14 1713 12/07/14 1350 12/08/14 0521  Weight: 100.954 kg (222 lb 9 oz) 99.837 kg (220 lb 1.6 oz) 98 kg (216 lb 0.8 oz)    Exam:   General:  A+Ox3, NAD  Cardiovascular: rrr  Respiratory: clear  Abdomen: +BS, soft  Musculoskeletal: no edema   Data Reviewed: Basic Metabolic Panel:  Recent Labs Lab 12/06/14 1726 12/07/14 0200 12/08/14 0217  NA 136 138 136  K 4.2 4.0 4.3  CL 103 105 102  CO2 21 26 24   GLUCOSE 242* 264* 161*  BUN 21 19 18   CREATININE 1.27 1.22 1.15  CALCIUM 9.7 9.0 9.1   Liver Function Tests:  Recent Labs Lab 12/07/14 0200  AST 17  ALT 16  ALKPHOS 89  BILITOT 0.7  PROT 5.9*  ALBUMIN 3.7   No results for input(s): LIPASE, AMYLASE in the last 168 hours. No results for input(s): AMMONIA in the last 168 hours. CBC:  Recent Labs Lab 12/06/14 1726 12/07/14 0200  12/08/14 0217  WBC 7.8 7.1 6.9  HGB 15.7 14.5 14.3  HCT 46.8 42.8 43.8  MCV 85.1 84.6 86.2  PLT 178 157 148*   Cardiac Enzymes: No results for input(s): CKTOTAL, CKMB, CKMBINDEX, TROPONINI in the last 168 hours. BNP (last 3 results) No results for input(s): BNP in the last 8760 hours.  ProBNP (last 3 results) No results for input(s): PROBNP in the last 8760 hours.  CBG:  Recent Labs Lab 12/07/14 0114 12/07/14 0632 12/07/14 1239 12/07/14 1626 12/07/14 2138  GLUCAP 286* 184* 219* 167* 162*    No results found for this or any previous visit (from the past 240 hour(s)).   Studies: Dg Chest 2 View (if Patient Has Fever And/or Copd)  12/06/2014   CLINICAL DATA:  Shortness of breath bowel ambulating today  EXAM: CHEST  2 VIEW  COMPARISON:  11/18/2012  FINDINGS: The heart size and mediastinal contours are within normal limits. Both lungs are clear. The visualized skeletal structures are unremarkable. Mild prominence of the pulmonary arteries bilaterally with tapering is identified which could represent superimposition of vascular structures although pulmonary arterial hypertension could appear similar. This and prominence of the left first and right second costochondral junctions is reidentified.  IMPRESSION: No active cardiopulmonary disease.   Electronically Signed   By: Conchita Paris M.D.   On: 12/06/2014 18:12  Ct Angio Chest Pe W/cm &/or Wo Cm  12/06/2014   CLINICAL DATA:  Shortness of breath.  History of DVTs in 2009.  EXAM: CT ANGIOGRAPHY CHEST WITH CONTRAST  TECHNIQUE: Multidetector CT imaging of the chest was performed using the standard protocol during bolus administration of intravenous contrast. Multiplanar CT image reconstructions and MIPs were obtained to evaluate the vascular anatomy.  CONTRAST:  26mL OMNIPAQUE IOHEXOL 350 MG/ML SOLN  COMPARISON:  10/11/2010  FINDINGS: Technically adequate study with good opacification of the central and segmental pulmonary arteries.  Examination is positive for multiple pulmonary emboli. Filling defects are demonstrated in the distal right main pulmonary artery extending into multiple upper and lower lobe segmental arteries. Filling defects are also demonstrated in multiple left lower lobe segmental arteries. RV to LV ratio calculates to 2.7.  Normal heart size. Normal caliber thoracic aorta. No aortic dissection. Great vessel origins are patent. Esophagus is decompressed. No significant lymphadenopathy in the chest. No focal airspace disease or consolidation in the lungs. Mild atelectasis in the lung bases. No evidence of pulmonary infarct. No pleural effusions. No pneumothorax.  Included portions of the upper abdominal organs are grossly unremarkable. Degenerative changes in the spine.  Review of the MIP images confirms the above findings.  IMPRESSION: Positive study for multiple bilateral pulmonary emboli. Increased RV to LV ratio. Positive for acute PE with CT evidence of right heart strain (RV/LV Ratio = 2.7) consistent with at least submassive (intermediate risk) PE. The presence of right heart strain has been associated with an increased risk of morbidity and mortality. Please activate Code PE by paging [phone].  These results were called by telephone at the time of interpretation on 12/06/2014 at 9:33 pm to Dr. Alfonzo Beers , who verbally acknowledged these results.   Electronically Signed   By: Lucienne Capers M.D.   On: 12/06/2014 21:35    Scheduled Meds: . ezetimibe-simvastatin  1 tablet Oral QHS  . insulin aspart  0-15 Units Subcutaneous TID WC  . insulin aspart  0-5 Units Subcutaneous QHS  . levothyroxine  75 mcg Oral QAC breakfast  . sodium chloride  3 mL Intravenous Q12H   Continuous Infusions: . heparin 2,100 Units/hr (12/08/14 0515)   Antibiotics Given (last 72 hours)    None      Principal Problem:   Acute pulmonary embolism Active Problems:   Malignant neoplasm of base of tongue   Diabetes mellitus  type 2 in nonobese    Time spent: 25 min    [name]  [facility] Pager [phone] If 7PM-7AM, please contact night-coverage at [email or web address], password [facility] 12/08/2014, 8:03 AM  LOS: 2 days

## 2014-12-09 DIAGNOSIS — E039 Hypothyroidism, unspecified: Secondary | ICD-10-CM | POA: Insufficient documentation

## 2014-12-09 DIAGNOSIS — I82403 Acute embolism and thrombosis of unspecified deep veins of lower extremity, bilateral: Secondary | ICD-10-CM

## 2014-12-09 LAB — BASIC METABOLIC PANEL
Anion gap: 13 (ref 5–15)
BUN: 22 mg/dL (ref 6–23)
CHLORIDE: 99 mmol/L (ref 96–112)
CO2: 24 mmol/L (ref 19–32)
Calcium: 9.2 mg/dL (ref 8.4–10.5)
Creatinine, Ser: 1.12 mg/dL (ref 0.50–1.35)
GFR calc Af Amer: 80 mL/min — ABNORMAL LOW (ref >90)
GFR calc non Af Amer: 69 mL/min — ABNORMAL LOW (ref >90)
GLUCOSE: 184 mg/dL — AB (ref 70–99)
POTASSIUM: 4.1 mmol/L (ref 3.5–5.1)
Sodium: 136 mmol/L (ref 135–145)

## 2014-12-09 LAB — CBC
HCT: 45.4 % (ref 39.0–52.0)
Hemoglobin: 15.1 g/dL (ref 13.0–17.0)
MCH: 28.7 pg (ref 26.0–34.0)
MCHC: 33.3 g/dL (ref 30.0–36.0)
MCV: 86.1 fL (ref 78.0–100.0)
Platelets: 175 10*3/uL (ref 150–400)
RBC: 5.27 MIL/uL (ref 4.22–5.81)
RDW: 13.1 % (ref 11.5–15.5)
WBC: 7.3 10*3/uL (ref 4.0–10.5)

## 2014-12-09 LAB — GLUCOSE, CAPILLARY
GLUCOSE-CAPILLARY: 249 mg/dL — AB (ref 70–99)
Glucose-Capillary: 230 mg/dL — ABNORMAL HIGH (ref 70–99)
Glucose-Capillary: 172 mg/dL — ABNORMAL HIGH (ref 70–99)
Glucose-Capillary: 178 mg/dL — ABNORMAL HIGH (ref 70–99)

## 2014-12-09 LAB — HEPARIN LEVEL (UNFRACTIONATED): Heparin Unfractionated: 0.43 IU/mL (ref 0.30–0.70)

## 2014-12-09 MED ORDER — RIVAROXABAN 15 MG PO TABS
15.0000 mg | ORAL_TABLET | Freq: Two times a day (BID) | ORAL | Status: DC
  Administered 2014-12-09 – 2014-12-10 (×2): 15 mg via ORAL
  Filled 2014-12-09 (×2): qty 1

## 2014-12-09 MED ORDER — RIVAROXABAN 20 MG PO TABS: 20.0000 mg | ORAL_TABLET | Freq: Every day | ORAL | Status: DC

## 2014-12-09 MED ORDER — INSULIN GLARGINE 100 UNIT/ML ~~LOC~~ SOLN
5.0000 [IU] | Freq: Every day | SUBCUTANEOUS | Status: DC
  Administered 2014-12-09 – 2014-12-10 (×2): 5 [IU] via SUBCUTANEOUS
  Filled 2014-12-09 (×2): qty 0.05

## 2014-12-09 NOTE — Progress Notes (Signed)
[facility] PROGRESS NOTE  [name] ZOX:[id number] DOB: [birth year] DOA: 12/06/2014 PCP:  Melinda Crutch, MD  Assessment/Plan: #1 acute bilateral pulmonary emboli Questionable etiology. Patient with prior history of PE and malignant neoplasm of the tongue. 2-D echo with EF of 55-60%. Right ventricle was poorly visualized. Right ventricle with normal systolic function. Patient's blood pressures have remained stable. Patient with clinical improvement. Lower extremity Dopplers positive for bilateral DVTs of the left, femoral vein extending to the left peroneal vein and right distal femoral vein extending through the right popliteal vein into the right peroneal veins. Patient on IV heparin. Will transition patient to oral Xarelto. As this is patient's second episode will likely need to be on anticoagulation lifelong.  #2 bilateral DVTs Lower extremity Dopplers with acute right distal femoral vein DVT extending through the right popliteal vein into the right peroneal veins. Acute left common femoral vein DVT extending to the left peroneal veins. Patient on IV heparin. Will transition patient to oral Xarelto. Patient will likely need to be on lifelong anticoagulation.  #3 hypothyroidism Continue Synthroid.  #4 type 2 diabetes mellitus CBGs have ranged from 213-235. Oral hypoglycemic agents on hold. We'll place on Lantus 5 units daily. Sliding scale insulin.  #5 hyperlipidemia Continue Vytorin.  #6 prophylaxis On IV heparin.  Code Status: Full Family Communication: Updated patient no family present.  Disposition Plan: Home tomorrow hopefully.   Consultants:  None  Procedures:  Bilateral lower extremity Dopplers 12/08/2014  CT angiogram chest 12/06/2014  Chest x-ray 12/06/2014  2-D echo 12/08/2014  Antibiotics:  None  HPI/Subjective: Patient with no complaints. Patient denies any chest pain. Patient states shortness of breath has improved. Patient denies any lower  extremity pain.  Objective: Filed Vitals:   12/09/14 0445  BP: 109/62  Pulse: 84  Temp: 97.9 F (36.6 C)  Resp: 18    Intake/Output Summary (Last 24 hours) at 12/09/14 1129 Last data filed at 12/09/14 0847  Gross per 24 hour  Intake    260 ml  Output    850 ml  Net   -590 ml   Filed Weights   12/07/14 1350 12/08/14 0521 12/09/14 0445  Weight: 99.837 kg (220 lb 1.6 oz) 98 kg (216 lb 0.8 oz) 97.977 kg (216 lb)    Exam:   General:  NAD  Cardiovascular: RRR  Respiratory: CTAB  Abdomen: Soft, nontender, nondistended, positive bowel sounds.  Musculoskeletal: No clubbing cyanosis or edema.  Data Reviewed: Basic Metabolic Panel:  Recent Labs Lab 12/06/14 1726 12/07/14 0200 12/08/14 0217 12/09/14 0335  NA 136 138 136 136  K 4.2 4.0 4.3 4.1  CL 103 105 102 99  CO2 21 26 24 24   GLUCOSE 242* 264* 161* 184*  BUN 21 19 18 22   CREATININE 1.27 1.22 1.15 1.12  CALCIUM 9.7 9.0 9.1 9.2   Liver Function Tests:  Recent Labs Lab 12/07/14 0200  AST 17  ALT 16  ALKPHOS 89  BILITOT 0.7  PROT 5.9*  ALBUMIN 3.7   No results for input(s): LIPASE, AMYLASE in the last 168 hours. No results for input(s): AMMONIA in the last 168 hours. CBC:  Recent Labs Lab 12/06/14 1726 12/07/14 0200 12/08/14 0217 12/09/14 0335  WBC 7.8 7.1 6.9 7.3  HGB 15.7 14.5 14.3 15.1  HCT 46.8 42.8 43.8 45.4  MCV 85.1 84.6 86.2 86.1  PLT 178 157 148* 175   Cardiac Enzymes: No results for input(s): CKTOTAL, CKMB, CKMBINDEX, TROPONINI in the last 168 hours. BNP (last 3  results) No results for input(s): BNP in the last 8760 hours.  ProBNP (last 3 results) No results for input(s): PROBNP in the last 8760 hours.  CBG:  Recent Labs Lab 12/08/14 0757 12/08/14 1124 12/08/14 1658 12/08/14 2053 12/09/14 0800  GLUCAP 181* 163* 213* 235* 230*    No results found for this or any previous visit (from the past 240 hour(s)).   Studies: No results found.  Scheduled Meds: .  ezetimibe-simvastatin  1 tablet Oral QHS  . insulin aspart  0-15 Units Subcutaneous TID WC  . insulin aspart  0-5 Units Subcutaneous QHS  . insulin glargine  5 Units Subcutaneous Daily  . levothyroxine  75 mcg Oral QAC breakfast  . rivaroxaban  15 mg Oral BID WC   Followed by  . [START ON 12/31/2014] rivaroxaban  20 mg Oral Q supper  . sodium chloride  3 mL Intravenous Q12H   Continuous Infusions: . heparin 2,100 Units/hr (12/09/14 1059)    Principal Problem:   Acute pulmonary embolism Active Problems:   Malignant neoplasm of base of tongue   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   Diabetes mellitus type 2 in nonobese    Time spent: 67 minutes    Saatvik Thielman M.D. [facility] Pager [phone]. If 7PM-7AM, please contact night-coverage at [email or web address], password [facility] 12/09/2014, 11:29 AM  LOS: 3 days

## 2014-12-09 NOTE — Consult Note (Signed)
   [facility] CM Inpatient Consult   12/09/2014  [name] 03-11-1956 [id number]   Came to bedside to visit with [name] on behalf of [employer] to [employer] program for [employer] employees/dependents with [employer]. [name] is already a member of [facility] for DM management. He endorses that he missed his scheduled appointment with [facility] [place] Coordinator due to being in the hospital. Reports he plans on following up post hospital discharge. Appreciative of visit. Left contact information at bedside. Will make inpatient RNCM aware patient active with [facility] Care Management.   Marthenia Rolling, MSN-Ed, RN,BSN [facility] Liaison [phone]

## 2014-12-09 NOTE — Progress Notes (Addendum)
ANTICOAGULATION CONSULT NOTE Pharmacy Consult for Heparin Indication: pulmonary embolus  No Known Allergies  Patient Measurements: Height: 6\' 3"  (190.5 cm) Weight: 216 lb (97.977 kg) IBW/kg (Calculated) : 84.5 Heparin Dosing Weight: 94kg  Vital Signs: Temp: 97.9 F (36.6 C) (04/06 0445) Temp Source: Oral (04/06 0445) BP: 109/62 mmHg (04/06 0445) Pulse Rate: 84 (04/06 0445)  Labs:  Recent Labs  12/07/14 0200  12/08/14 0217 12/08/14 1450 12/09/14 0335  HGB 14.5  --  14.3  --  15.1  HCT 42.8  --  43.8  --  45.4  PLT 157  --  148*  --  175  LABPROT 15.2  --   --   --   --   INR 1.19  --   --   --   --   HEPARINUNFRC 0.39  < > 0.30 0.65 0.43  CREATININE 1.22  --  1.15  --  1.12  < > = values in this interval not displayed.  Estimated Creatinine Clearance: 82.8 mL/min (by C-G formula based on Cr of 1.12).  Assessment: 59 YO male with PE on heparin at 2100 units/hr and at goal (HL= 0.43). Patient also noted with bilateral DVT. Patient to transition to Xarelto today   Goal of Therapy:  Heparin level 0.3-0.7 units/ml Monitor platelets by anticoagulation protocol: Yes   Plan:  -No heparin changes needed -Will begin Xarelto 15mg  po bid tonight for 21 days then transition to 20mg  po daily beginning 12/31/14 -Discontinue heparin when Xarelto started -Will provide patient education  Hildred Laser, Pharm D 12/09/2014 7:36 AM

## 2014-12-09 NOTE — Discharge Instructions (Signed)
Information on my medicine - XARELTO (rivaroxaban)  This medication education was reviewed with me or my healthcare representative as part of my discharge preparation.  The pharmacist that spoke with me during my hospital stay was:  Dareen Piano, [place]? Xarelto was prescribed to treat blood clots that may have been found in the veins of your legs (deep vein thrombosis) or in your lungs (pulmonary embolism) and to reduce the risk of them occurring again.  What do you need to know about Xarelto? The starting dose is one 15 mg tablet taken TWICE daily with food for the FIRST 21 DAYS then on 12/31/14  the dose is changed to one 20 mg tablet taken ONCE A DAY with your evening meal.  DO NOT stop taking Xarelto without talking to the health care provider who prescribed the medication.  Refill your prescription for 20 mg tablets before you run out.  After discharge, you should have regular check-up appointments with your healthcare provider that is prescribing your Xarelto.  In the future your dose may need to be changed if your kidney function changes by a significant amount.  What do you do if you miss a dose? If you are taking Xarelto TWICE DAILY and you miss a dose, take it as soon as you remember. You may take two 15 mg tablets (total 30 mg) at the same time then resume your regularly scheduled 15 mg twice daily the next day.  If you are taking Xarelto ONCE DAILY and you miss a dose, take it as soon as you remember on the same day then continue your regularly scheduled once daily regimen the next day. Do not take two doses of Xarelto at the same time.   Important Safety Information Xarelto is a blood thinner medicine that can cause bleeding. You should call your healthcare provider right away if you experience any of the following: ? Bleeding from an injury or your nose that does not stop. ? Unusual colored urine (red or dark brown) or unusual  colored stools (red or black). ? Unusual bruising for unknown reasons. ? A serious fall or if you hit your head (even if there is no bleeding).  Some medicines may interact with Xarelto and might increase your risk of bleeding while on Xarelto. To help avoid this, consult your healthcare provider or pharmacist prior to using any new prescription or non-prescription medications, including herbals, vitamins, non-steroidal anti-inflammatory drugs (NSAIDs) and supplements.  This website has more information on Xarelto: [email or web address]

## 2014-12-10 LAB — CBC
HCT: 46.4 % (ref 39.0–52.0)
Hemoglobin: 15.4 g/dL (ref 13.0–17.0)
MCH: 28.1 pg (ref 26.0–34.0)
MCHC: 33.2 g/dL (ref 30.0–36.0)
MCV: 84.7 fL (ref 78.0–100.0)
PLATELETS: 175 10*3/uL (ref 150–400)
RBC: 5.48 MIL/uL (ref 4.22–5.81)
RDW: 13 % (ref 11.5–15.5)
WBC: 6.1 10*3/uL (ref 4.0–10.5)

## 2014-12-10 LAB — GLUCOSE, CAPILLARY
Glucose-Capillary: 217 mg/dL — ABNORMAL HIGH (ref 70–99)
Glucose-Capillary: 288 mg/dL — ABNORMAL HIGH (ref 70–99)

## 2014-12-10 MED ORDER — RIVAROXABAN 20 MG PO TABS: 20.0000 mg | ORAL_TABLET | Freq: Every day | ORAL | Status: DC

## 2014-12-10 MED ORDER — RIVAROXABAN (XARELTO) VTE STARTER PACK (15 & 20 MG): ORAL_TABLET | ORAL | Status: DC

## 2014-12-10 NOTE — Progress Notes (Signed)
Pt discharged home via [place], condition stable, accompanied by spouse.

## 2014-12-10 NOTE — Discharge Summary (Signed)
Physician Discharge Summary  [name] NID:[id number] DOB: May 24, 1956 DOA: 12/06/2014  PCP:  Melinda Crutch, MD  Admit date: 12/06/2014 Discharge date: 12/10/2014  Time spent: 65 minutes  Recommendations for Outpatient Follow-up:  1. Follow-up with  Melinda Crutch, MD in 1 week. All follow-up patient's PE end DVT would need to be reassessed per PCP. Will defer referral to pulmonic just to PCP. Patient likely needs to be on anticoagulation indefinitely.  Discharge Diagnoses:  Principal Problem:   Acute pulmonary embolism Active Problems:   Malignant neoplasm of base of tongue   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   Diabetes mellitus type 2 in nonobese   Thyroid activity decreased   Discharge Condition: stable and improved.  Diet recommendation: Carb modified  Filed Weights   12/08/14 0521 12/09/14 0445 12/10/14 0529  Weight: 98 kg (216 lb 0.8 oz) 97.977 kg (216 lb) 97.16 kg (214 lb 3.2 oz)    History of present illness:  [name] is a 59 y.o. male with Past medical history of Cancer of the base of the tongue status post chemoradiation , diabetes mellitus type 2, dyslipidemia, hypothyroidism, prior DVT. the patient presented with shortness of breath that suddenly started on the morning of admission. He mentioned he was at his baseline the day prior to that. Pt denied any fever, chills, headache, runny nose, neck pain, choking episodes, cough, rash anywhere,  chest pain, palpitation, orthopnea, PND,  nausea, vomiting, diarrhea, constipation, abdominal pain, acid reflux, active bleeding, black color BM,  burning urination, increase urinary frequency,  fall, trauma or injury, dizziness, pedal edema, difficulty swallowing, focal neurological deficit.  he denied any recent travel or immobilization or recent flight. He denied any recent leg swelling or leg tenderness.  The patient was presenting from home. And at his baseline independent for  most of his ADL.   Hospital Course:  #1 acute bilateral pulmonary emboli Questionable etiology. Patient with prior history of PE and malignant neoplasm of the tongue. CT angiogram of the chest which was done on admission was consistent with bilateral pulmonary emboli. Patient was placed empirically on IV heparin. 2-D echo with EF of 55-60%. Right ventricle was poorly visualized. Right ventricle with normal systolic function. Patient's blood pressures have remained stable throughout the hospitalization. Patient improved clinically. Lower extremity Dopplers positive for bilateral DVTs of the left, femoral vein extending to the left peroneal vein and right distal femoral vein extending through the right popliteal vein into the right peroneal veins. Patient on IV heparin and subsequently transitioned to oral Xarelto. As this is patient's second episode will likely need to be on anticoagulation lifelong.  #2 bilateral DVTs Lower extremity Dopplers were done which showed acute right distal femoral vein DVT extending through the right popliteal vein into the right peroneal veins. Acute left common femoral vein DVT extending to the left peroneal veins. Patient was initially placed on IV heparin and subsequently transitioned to oral Xarelto. Patient will likely need to be on lifelong anticoagulation.  #3 hypothyroidism Continued on home regimen of Synthroid.  #4 type 2 diabetes mellitus CBGs remained stable throughout the hospitalization. Oral hypoglycemic agents were held. Patient was placed on low-dose Lantus as well as sliding scale insulin during the hospitalization. We'll place on Lantus 5 units daily. Sliding scale insulin.  #5 hyperlipidemia Continued on home regimen of Vytorin.   Procedures:  Bilateral lower extremity Dopplers 12/08/2014  CT angiogram chest 12/06/2014  Chest x-ray 12/06/2014  2-D echo 12/08/2014  Consultations:  None  Discharge Exam: Filed Vitals:   12/10/14 0529   BP: 112/64  Pulse: 82  Temp: 97.7 F (36.5 C)  Resp:     General: NAD Cardiovascular: RRR Respiratory: CTAB  Discharge Instructions   Discharge Instructions    AMB Referral to [facility]    Complete by:  As directed   Reason for consult:  already active with  Link to Wellness program for DM management  Diagnoses of:  Diabetes  Expected date of contact:  1-3 days (reserved for hospital discharges)     Diet Carb Modified    Complete by:  As directed      Discharge instructions    Complete by:  As directed   Follow up with  Melinda Crutch, MD in 1 week.     Increase activity slowly    Complete by:  As directed           Current Discharge Medication List    START taking these medications   Details  Rivaroxaban (XARELTO STARTER PACK) 15 & 20 MG TBPK Take as directed on package: Start with one 15mg  tablet by mouth twice a day with food. On Day 22, switch to one 20mg  tablet once a day with food. Qty: 51 each, Refills: 0    rivaroxaban (XARELTO) 20 MG TABS tablet Take 1 tablet (20 mg total) by mouth daily with supper. Qty: 30 tablet, Refills: 0      CONTINUE these medications which have NOT CHANGED   Details  Canagliflozin (INVOKANA PO) Take 1 tablet by mouth daily.    ezetimibe-simvastatin (VYTORIN) 10-20 MG per tablet Take 1 tablet by mouth at bedtime.      glimepiride (AMARYL) 4 MG tablet Take 4 mg by mouth 2 (two) times daily.     levothyroxine (SYNTHROID, LEVOTHROID) 75 MCG tablet Take 75 mcg by mouth daily before breakfast.    metFORMIN (GLUCOPHAGE) 500 MG tablet Take 1,000 mg by mouth 2 (two) times daily with a meal.    sitaGLIPtin (JANUVIA) 100 MG tablet Take 100 mg by mouth daily.      STOP taking these medications     diazepam (VALIUM) 5 MG tablet      naproxen (NAPROSYN) 500 MG tablet      oxyCODONE-acetaminophen (PERCOCET) 5-325 MG per tablet        No Known Allergies Follow-up Information    Follow up with  Melinda Crutch, MD. Go on  12/17/2014.   Specialty:  Family Medicine   Why:  hospital f/u @ 2:45pm    Contact information:   [address] [place] [id number] [phone]        The results of significant diagnostics from this hospitalization (including imaging, microbiology, ancillary and laboratory) are listed below for reference.    Significant Diagnostic Studies: Dg Chest 2 View (if Patient Has Fever And/or Copd)  12/06/2014   CLINICAL DATA:  Shortness of breath bowel ambulating today  EXAM: CHEST  2 VIEW  COMPARISON:  11/18/2012  FINDINGS: The heart size and mediastinal contours are within normal limits. Both lungs are clear. The visualized skeletal structures are unremarkable. Mild prominence of the pulmonary arteries bilaterally with tapering is identified which could represent superimposition of vascular structures although pulmonary arterial hypertension could appear similar. This and prominence of the left first and right second costochondral junctions is reidentified.  IMPRESSION: No active cardiopulmonary disease.   Electronically Signed   By: Conchita Paris M.D.   On: 12/06/2014 18:12  Ct Angio Chest Pe W/cm &/or Wo Cm  12/06/2014   CLINICAL DATA:  Shortness of breath.  History of DVTs in 2009.  EXAM: CT ANGIOGRAPHY CHEST WITH CONTRAST  TECHNIQUE: Multidetector CT imaging of the chest was performed using the standard protocol during bolus administration of intravenous contrast. Multiplanar CT image reconstructions and MIPs were obtained to evaluate the vascular anatomy.  CONTRAST:  22mL OMNIPAQUE IOHEXOL 350 MG/ML SOLN  COMPARISON:  10/11/2010  FINDINGS: Technically adequate study with good opacification of the central and segmental pulmonary arteries. Examination is positive for multiple pulmonary emboli. Filling defects are demonstrated in the distal right main pulmonary artery extending into multiple upper and lower lobe segmental arteries. Filling defects are also demonstrated in multiple left lower  lobe segmental arteries. RV to LV ratio calculates to 2.7.  Normal heart size. Normal caliber thoracic aorta. No aortic dissection. Great vessel origins are patent. Esophagus is decompressed. No significant lymphadenopathy in the chest. No focal airspace disease or consolidation in the lungs. Mild atelectasis in the lung bases. No evidence of pulmonary infarct. No pleural effusions. No pneumothorax.  Included portions of the upper abdominal organs are grossly unremarkable. Degenerative changes in the spine.  Review of the MIP images confirms the above findings.  IMPRESSION: Positive study for multiple bilateral pulmonary emboli. Increased RV to LV ratio. Positive for acute PE with CT evidence of right heart strain (RV/LV Ratio = 2.7) consistent with at least submassive (intermediate risk) PE. The presence of right heart strain has been associated with an increased risk of morbidity and mortality. Please activate Code PE by paging [phone].  These results were called by telephone at the time of interpretation on 12/06/2014 at 9:33 pm to Dr. Alfonzo Beers , who verbally acknowledged these results.   Electronically Signed   By: Lucienne Capers M.D.   On: 12/06/2014 21:35    Microbiology: No results found for this or any previous visit (from the past 240 hour(s)).   Labs: Basic Metabolic Panel:  Recent Labs Lab 12/06/14 1726 12/07/14 0200 12/08/14 0217 12/09/14 0335  NA 136 138 136 136  K 4.2 4.0 4.3 4.1  CL 103 105 102 99  CO2 21 26 24 24   GLUCOSE 242* 264* 161* 184*  BUN 21 19 18 22   CREATININE 1.27 1.22 1.15 1.12  CALCIUM 9.7 9.0 9.1 9.2   Liver Function Tests:  Recent Labs Lab 12/07/14 0200  AST 17  ALT 16  ALKPHOS 89  BILITOT 0.7  PROT 5.9*  ALBUMIN 3.7   No results for input(s): LIPASE, AMYLASE in the last 168 hours. No results for input(s): AMMONIA in the last 168 hours. CBC:  Recent Labs Lab 12/06/14 1726 12/07/14 0200 12/08/14 0217 12/09/14 0335 12/10/14 0326   WBC 7.8 7.1 6.9 7.3 6.1  HGB 15.7 14.5 14.3 15.1 15.4  HCT 46.8 42.8 43.8 45.4 46.4  MCV 85.1 84.6 86.2 86.1 84.7  PLT 178 157 148* 175 175   Cardiac Enzymes: No results for input(s): CKTOTAL, CKMB, CKMBINDEX, TROPONINI in the last 168 hours. BNP: BNP (last 3 results) No results for input(s): BNP in the last 8760 hours.  ProBNP (last 3 results) No results for input(s): PROBNP in the last 8760 hours.  CBG:  Recent Labs Lab 12/09/14 0800 12/09/14 1159 12/09/14 1646 12/09/14 2046 12/10/14 0726  GLUCAP 230* 172* 178* 249* 217*       Signed:  THOMPSON,DANIEL MD [facility] 12/10/2014, 11:21 AM

## 2014-12-11 ENCOUNTER — Other Ambulatory Visit: Payer: Self-pay

## 2014-12-11 NOTE — Patient Outreach (Signed)
[facility]) Care Management  12/11/2014  [name] March 21, 1956 [id number]  Transition of care phone call to member after hospitalization for pulmonary embolism and SOB. States he is now on a blood thinner and he has the starter pack of Xarelto. States he is to go to see Dr.Ross on 12/17/14.   Reviewed s/s of PE and to call 911 if they occur. Instructed to keep appointment with Dr.Ross and encouraged to discuss with him about his increased hemoglobin A1C and if he needs to adjust his medications. Scheduled Link to Wellness  follow up appointment for 12/21/14

## 2014-12-21 ENCOUNTER — Other Ambulatory Visit: Payer: Self-pay

## 2014-12-21 VITALS — BP 110/72 | HR 90 | Ht 75.0 in | Wt 217.6 lb

## 2014-12-21 DIAGNOSIS — E119 Type 2 diabetes mellitus without complications: Secondary | ICD-10-CM

## 2014-12-21 NOTE — Patient Outreach (Signed)
[facility]) Care Management   12/21/2014  [name] Jan 23, 1956 [id number]  [name] is an 59 y.o. male.    Member seen for follow up office visit for Link to Wellness program for self management of Type 2 diabetes  Subjective: Member states that he is slowly recovering from his pulmonary embolism.  States he saw Dr.Ross last week and he put him back on Victoza and stopped his Januvia.  States that he has lost weight since he was in the hospital.  States he tries to eat regular and he is watching his portions better.  States that his morning blood sugars have been higher than his after meal readings.  States he is slowly getting back to walking every day.  States he is to go back to work in 2 weeks.  Denies any SOB or chest pains.  States he is still on the lower dose of Xarelto.  States he is wearing his compression hose daily.  Objective:   Review of Systems  All other systems reviewed and are negative.   Physical Exam  Filed Vitals:   12/21/14 1408  BP: 110/72  Pulse: 90  Pulse oximetry on room air is 96%. Filed Weights   12/21/14 1408  Weight: 217 lb 9.6 oz (98.703 kg)    Current Medications:   Current Outpatient Prescriptions  Medication Sig Dispense Refill  . Canagliflozin (INVOKANA PO) Take 300 mg by mouth daily.     [name] ezetimibe-simvastatin (VYTORIN) 10-20 MG per tablet Take 1 tablet by mouth at bedtime.      [name] glimepiride (AMARYL) 4 MG tablet Take 4 mg by mouth 2 (two) times daily.     [name] levothyroxine (SYNTHROID, LEVOTHROID) 75 MCG tablet Take 75 mcg by mouth daily before breakfast.    . Liraglutide 18 MG/3ML SOPN Inject 18 mg into the skin daily.    . metFORMIN (GLUCOPHAGE) 500 MG tablet Take 1,000 mg by mouth 2 (two) times daily with a meal.    . Rivaroxaban (XARELTO STARTER PACK) 15 & 20 MG TBPK Take as directed on package: Start with one 15mg  tablet by mouth twice a day with food. On Day 22, switch to one 20mg  tablet once a day with food. 51  each 0  . [START ON 01/01/2015] rivaroxaban (XARELTO) 20 MG TABS tablet Take 1 tablet (20 mg total) by mouth daily with supper. (Patient not taking: Reported on 12/21/2014) 30 tablet 0  . sitaGLIPtin (JANUVIA) 100 MG tablet Take 100 mg by mouth daily.     No current facility-administered medications for this visit.    Functional Status:   In your present state of health, do you have any difficulty performing the following activities: 12/21/2014 12/07/2014  Hearing? N N  Vision? N N  Difficulty concentrating or making decisions? N N  Walking or climbing stairs? N N  Dressing or bathing? N N  Doing errands, shopping? N N    Fall/Depression Screening:    PHQ 2/9 Scores 12/21/2014  PHQ - 2 Score 0   [facility] CM Care Plan        Patient Outreach from 12/21/2014 in [place] Problem One  Elevated blood sugars related to dx of Type 2 Dm   Care Plan for Problem One  Active   Interventions for Problem One Long Term Goal  Reviewed CHO counting and portion control, Instructed to try eating a bedtime snack of protein and CHO to see  if this will help with his  higher fasting blood sugars, Encouraged to increase his exercise as tolerated, Reviewed s/s of  PE to  be aware of and to call 911, Reviewed use  of Victoza  and  reviewed site rotation   [facility] Long Term Goal (31-90 days)  Member will decrease hemoglobin A1C to 7.0 or below within the next 90 days   THN Long Term Goal Start Date  12/21/14     [place]        Patient Outreach from 12/21/2014 in [place] [facility] [place] Problem One  Elevated blood sugars related to dx of Type 2 DM as evidenced by Hemoglobin A1C of 8.6   Care Plan for Problem One  Active   Interventions for Problem One Long Term Goal  Reviewed CHO counting and portion control, Instructed to try eating a bedtime snack of protein and CHO to see if this will help with his  higher fasting blood sugars, Encouraged to  increase his exercise as tolerated, Reviewed s/s of  PE to  be aware of and to call 911, Reviewed use  of Victoza  and  reviewed site rotation   [facility] Long Term Goal (31-90 days)  Member will decrease hemoglobin A1C to 7.0 or below within the next 90 days   [facility]N Long Term Goal Start Date  12/21/14      Assessment:  Member had increased hemoglobin A1C during his hospitalization for pulmonary embolism.  He had been less active after having shoulder surgery and he was off of work.  His blood sugars have been higher fasting in the 180 range and 150-160 2 hr post prandial.  He was restarted on Victoza when he saw MD on 12/17/14.   He is to see Dr.Ross on 516/16 and he will have his hemoglobin A1C rechecked at that time.  He reports that he is trying to watch is portion sizes and CHO better.  He has lost 8 lb since last visit on 09/22/14.  He is tolerating medications without reported side effects.  No bleeding or bruising reported.  No SOB or chest pains reported.he can verbalize s/s of pulmonary embolism to call 911  Plan:  Plan to check blood sugar daily fasting or 2 hours after eating.  Goals of 80-130 before eating and less than 180 after eating. Plan to eat a bedtime snack of protein and carbohydrate Plan to walk 3-4 times a week for 20-30 minutes.   Plan to keep appt with Dr. Harrington Challenger on 01/18/15       Plan to return to Link to Wellness on March 01, 2015 10AM

## 2014-12-21 NOTE — Patient Instructions (Signed)
1. Plan to check blood sugar daily fasting or 2 hours after eating.  Goals of 80-130 before eating and less than 180 after eating. 2. Plan to eat a bedtime snack of protein and carbohydrate 3. Plan to walk 3-4 times a week for 20-30 minutes.   4. Plan to keep appt with Dr. Harrington Challenger on 01/18/15 5. Plan to return to Link to Wellness on March 01, 2015 10AM

## 2015-03-01 ENCOUNTER — Other Ambulatory Visit: Payer: Self-pay

## 2015-03-01 VITALS — BP 122/80 | HR 99 | Ht 75.0 in | Wt 216.8 lb

## 2015-03-01 DIAGNOSIS — E119 Type 2 diabetes mellitus without complications: Secondary | ICD-10-CM

## 2015-03-01 NOTE — Patient Instructions (Signed)
Plan to check blood sugar daily fasting or 2 hours after eating.  Goals of 80-130 before eating and less than 180 after eating. Plan to continue to  eat a bedtime snack of protein and carbohydrate Plan to walk 4-5 times a week for 30-35 minutes.   Plan to keep appt with Dr. Harrington Challenger on 04/14/15       Plan to return to Link to Wellness on May 17, 2015 at 10

## 2015-03-01 NOTE — Patient Outreach (Signed)
[facility]) Care Management   03/01/2015  NILAN IDDINGS 11/30/1955 [id number]  [name] is an 59 y.o. male.   Member seen for follow up office visit for Link to Wellness program for self management of Type 2 diabetes  Subjective:  Member states that he has been doing much better and he feels he has recovered from his pulmonary embolism.  States he is back to work now and he plans to move to day shift in a month or so.  States that he saw Dr.Ross last month and his hemoglobin A1C was down to 7.5.  States that he is now walking 30-35 minutes at least 4 days a week and he walks at work also.  States that he has been taking the Victoza without difficulty and he thinks his blood sugars have improved.  Objective:   Review of Systems  All other systems reviewed and are negative. Glucometer reviewed with 14 day average of 135  Physical Exam  Filed Vitals:   03/01/15 1011  BP: 122/80  Pulse: 99   Filed Weights   03/01/15 1011  Weight: 216 lb 12.8 oz (98.34 kg)    Current Medications:   Current Outpatient Prescriptions  Medication Sig Dispense Refill  . Canagliflozin (INVOKANA PO) Take 300 mg by mouth daily.     [name] ezetimibe-simvastatin (VYTORIN) 10-20 MG per tablet Take 1 tablet by mouth at bedtime.      [name] glimepiride (AMARYL) 4 MG tablet Take 4 mg by mouth 2 (two) times daily.     [name] levothyroxine (SYNTHROID, LEVOTHROID) 75 MCG tablet Take 75 mcg by mouth daily before breakfast.    . Liraglutide 18 MG/3ML SOPN Inject 1.2 mg into the skin daily. Taking 1.2 mg daily    . metFORMIN (GLUCOPHAGE) 500 MG tablet Take 1,000 mg by mouth 2 (two) times daily with a meal.    . rivaroxaban (XARELTO) 20 MG TABS tablet Take 1 tablet (20 mg total) by mouth daily with supper. 30 tablet 0  . Rivaroxaban (XARELTO STARTER PACK) 15 & 20 MG TBPK Take as directed on package: Start with one '15mg'$  tablet by mouth twice a day with food. On Day 22, switch to one '20mg'$  tablet once a day with  food. (Patient not taking: Reported on 03/01/2015) 51 each 0  . sitaGLIPtin (JANUVIA) 100 MG tablet Take 100 mg by mouth daily.     No current facility-administered medications for this visit.    Functional Status:   In your present state of health, do you have any difficulty performing the following activities: 03/01/2015 12/21/2014  Hearing? N N  Vision? N N  Difficulty concentrating or making decisions? N N  Walking or climbing stairs? N N  Dressing or bathing? N N  Doing errands, shopping? N N  Preparing Food and eating ? N -  Using the Toilet? N -  In the past six months, have you accidently leaked urine? N -  Do you have problems with loss of bowel control? N -  Managing your Medications? N -  Managing your Finances? N -  Housekeeping or managing your Housekeeping? N -    Fall/Depression Screening:    PHQ 2/9 Scores 03/01/2015 12/21/2014  PHQ - 2 Score 0 0   [facility]N CM Care Plan Problem One        Patient Outreach from 03/01/2015 in [place] Problem One  Elevated blood sugars related to dx of Type 2 DM as  evidenced by Hemoglobin A1C of 8.6   Care Plan for Problem One  Active   THN Long Term Goal (31-90 days)  Member will decrease hemoglobin A1C to 7.0 or below within the next 90 days   [facility]N Long Term Goal Start Date  03/01/15 Maylon Peppers lowered Hemoglobin A1C to 7.5 from 8.6]   Interventions for Problem One Long Term Goal  Reviewed CHO counting and portion control, Reinforced to eat snacks with protein and CHO , Instructed to be sure to eat breakfast when he starts working day shift especially when taking glimepiride, Reinforced to continue to walk 4-5 times a week      Assessment:   Member seen for follow up office visit for Link to Wellness program for self management of Type 2 diabetes.  Member has seen improvement in hemoglobin A1C from 8.6 to 7.5.  He has started back to work after recovering from pulmonary embolism.  He has been more active and has been  following limited CHO diet.  He is now taking his Victoza without any reported difficulties. CBGs have ranged 104-138 in AM and 130-173 after meals He had eye exam on 01/13/15.  He has a colonoscopy scheduled in August.     Plan:  Plan to check blood sugar daily fasting or 2 hours after eating.  Goals of 80-130 before eating and less than 180 after eating. Plan to continue to  eat a bedtime snack of protein and carbohydrate Plan to walk 4-5 times a week for 30-35 minutes.   Plan to keep appt with Dr. Harrington Challenger on 04/14/15       Plan to return to Link to Wellness on May 17, 2015 at [place] RN, [facility] Care Management Coordinator-Link to [place] [facility] [phone]

## 2015-04-19 ENCOUNTER — Other Ambulatory Visit: Payer: Self-pay | Admitting: Gastroenterology

## 2015-05-17 ENCOUNTER — Other Ambulatory Visit: Payer: Self-pay

## 2015-05-17 VITALS — BP 130/80 | HR 86 | Resp 16 | Ht 75.0 in | Wt 210.6 lb

## 2015-05-17 DIAGNOSIS — E119 Type 2 diabetes mellitus without complications: Secondary | ICD-10-CM

## 2015-05-17 NOTE — Patient Outreach (Signed)
[facility]) Care Management   05/17/2015  CAS TRACZ 1955-11-27 [id number]  [name] is an 59 y.o. male.   Member seen for follow up office visit for Link to Wellness program for self management of Type 2 diabetes  Subjective: Member states that he saw Dr.Ross 8/10 and his hemoglobin A1C had decreased to 6.5.  States he has been trying hard to watch his diet and to get more exercise.  States he is walking 3-4 times a week and he deer hunts on the weekends.  States he tries to walk instead of taking the 4 wheeler when his is hunting.  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    Objective:   Review of Systems  Musculoskeletal: Positive for joint pain.  Member did not bring glucometer for review  Physical Exam  Today's Vitals   05/17/15 1020  BP: 130/80  Pulse: 86  Resp: 16  Height: 1.905 m ('6\' 3"' )  Weight: 210 lb 9.6 oz (95.528 kg)  SpO2: 97%  PainSc: 0-No pain    Current Medications:   Current Outpatient Prescriptions  Medication Sig Dispense Refill  . Canagliflozin (INVOKANA PO) Take 300 mg by mouth daily.     [name] ezetimibe-simvastatin (VYTORIN) 10-20 MG per tablet Take 1 tablet by mouth at bedtime.      [name] glimepiride (AMARYL) 4 MG tablet Take 4 mg by mouth 2 (two) times daily.     [name] levothyroxine (SYNTHROID, LEVOTHROID) 75 MCG tablet Take 75 mcg by mouth daily before breakfast.    . Liraglutide 18 MG/3ML SOPN Inject 1.2 mg into the skin daily. Taking 1.2 mg daily    . metFORMIN (GLUCOPHAGE) 500 MG tablet Take 1,000 mg by mouth 2 (two) times daily with a meal.    . Multiple Vitamins-Minerals (MULTIVITAMIN & MINERAL PO) Take 1 tablet by mouth daily.    . rivaroxaban (XARELTO) 20 MG TABS tablet Take 1 tablet (20 mg total) by mouth daily with supper. 30 tablet 0  . Rivaroxaban (XARELTO STARTER PACK) 15 & 20 MG TBPK Take as directed on package: Start with one 92m tablet  by mouth twice a day with food. On Day 22, switch to one 279mtablet once a day with food. (Patient not taking: Reported on 03/01/2015) 51 each 0  . sitaGLIPtin (JANUVIA) 100 MG tablet Take 100 mg by mouth daily.     No current facility-administered medications for this visit.    Functional Status:   In your present state of health, do you have any difficulty performing the following activities: 05/17/2015 03/01/2015  Hearing? N N  Vision? N N  Difficulty concentrating or making decisions? N N  Walking or climbing stairs? N N  Dressing or bathing? N N  Doing errands, shopping? N N  Preparing Food and eating ? - N  Using the Toilet? - N  In the past six months, have you accidently leaked urine? - N  Do you have problems with loss of bowel control? - N  Managing your Medications? - N  Managing your Finances? - N  Housekeeping or managing your Housekeeping? - N    Fall/Depression Screening:    PHQ 2/9 Scores 05/17/2015 03/01/2015 12/21/2014  PHQ - 2 Score 0 0 0    Assessment:   Member seen for follow up office visit for Link to Wellness program for self management of Type 2  diabetes.  Member's Type 2 DM is under better control with last hemoglobin A1C of 6.5.  Reports taking medications as directed with no difficulty.  Member has increased activity with deer hunting season.  Member reports following limited CHO diet.  Plan:  1. Plan to check blood sugar daily fasting or 2 hours after eating.  Goals of 80-130 before eating and less than 180 after eating. 2. Plan to continue to  eat a bedtime snack of protein and carbohydrate 3. Plan to walk 4-5 times a week for 30-35 minutes.   4. Plan to keep appointment with Dr. Harrington Challenger on 07/21/15 5. Plan to return to Link to Wellness on September 13, 2015 at [place] Problem One        Most Recent Value   Care Plan Problem One  Elevated blood sugars related to dx of Type 2 DM as evidenced by Hemoglobin A1C of 8.6   Role Documenting the  Problem One  Care Management [place] for Problem One  Not Active   THN Long Term Goal (31-90 days)  Member will decrease hemoglobin A1C to 7.0 or below within the next 90 days   THN Long Term Goal Start Date  03/01/15 Maylon Peppers lowered Hemoglobin A1C to 7.5 from 8.6]   [facility] Long Term Goal Met Date  05/17/15 Maylon Peppers has lowered hemoglobin A1C to 6.5]    [facility] CM Care Plan Problem Two        Most Recent Value   Care Plan Problem Two  Potential for elevated blood sugars   Role Documenting the Problem Two  Care Management [place] for Problem Two  Active   Interventions for Problem Two Long Term Goal   Praised member for lowering his hemoglobin A1C to 6.5, Reinforced CHO counting and portion control, Reinforced to exercise regularly and to try to walk when hunting instead of riding the 4 wheeler, Reinforced to always eat when taking his glimepiride and to eat snacks with protein and CHO,     THN Long Term Goal (31-90) days  Member will maintain hemoglobin A1C at or below 7 for the next 90 days   [facility] Long Term Goal Start Date  05/17/15     Peter Garter RN, [facility] Care Management Coordinator-Link to [place] Management [phone]

## 2015-05-17 NOTE — Patient Instructions (Signed)
1. Plan to check blood sugar daily fasting or 2 hours after eating.  Goals of 80-130 before eating and less than 180 after eating. 2. Plan to continue to  eat a bedtime snack of protein and carbohydrate 3. Plan to walk 4-5 times a week for 30-35 minutes.   4. Plan to keep appointment with Dr. Harrington Challenger on 07/21/15 5. Plan to return to Link to Wellness on September 13, 2015 at [facility]

## 2015-09-13 ENCOUNTER — Other Ambulatory Visit: Payer: Self-pay

## 2015-09-13 VITALS — BP 116/76 | HR 84 | Resp 16 | Ht 75.0 in | Wt 213.0 lb

## 2015-09-13 DIAGNOSIS — E119 Type 2 diabetes mellitus without complications: Secondary | ICD-10-CM

## 2015-09-13 NOTE — Patient Instructions (Signed)
1. Plan to check blood sugar 1-2 times a day fasting or 2 hours after eating.  Goals of 80-130 before eating and less than 180 after eating. 2. Plan to continue to  eat a bedtime snack of protein and carbohydrate 3. Plan to walk 4-5 times a week for 30-35 minutes.   4. Plan to return to Link to Wellness on 12/07/15 at 3:30PM

## 2015-09-13 NOTE — Patient Outreach (Signed)
[facility]) Care Management   09/13/2015  [name] 16-Nov-1955 [id number]  [name] is an 60 y.o. male.   Member seen for follow up office visit for Link to Wellness program for self management of Type 2 diabetes  Subjective: Member states that he has moved to day shift on his job and his blood sugars have been better.  States that his hemoglobin A1C was 6.8 when he saw Dr.Ross in November.  States that he has been eating better and at regular times since going on day shift.   States he has been doing a lot of walking during deer hunting season and he tries to walk 3-4 times a week for 20-25 minutes.  States that his morning blood sugars range 100-110 and his after meal readings  from 130-170   Objective:   Review of Systems  All other systems reviewed and are negative.   Physical Exam  Today's Vitals   09/13/15 1107  BP: 116/76  Pulse: 84  Resp: 16  Height: 1.905 m ('6\' 3"' )  Weight: 213 lb (96.616 kg)  SpO2: 99%  PainSc: 0-No pain    Current Medications:   Current Outpatient Prescriptions  Medication Sig Dispense Refill  . Canagliflozin (INVOKANA PO) Take 300 mg by mouth daily.     [name] ezetimibe-simvastatin (VYTORIN) 10-20 MG per tablet Take 1 tablet by mouth at bedtime.      [name] glimepiride (AMARYL) 4 MG tablet Take 4 mg by mouth 2 (two) times daily.     [name] levothyroxine (SYNTHROID, LEVOTHROID) 75 MCG tablet Take 75 mcg by mouth daily before breakfast.    . Liraglutide 18 MG/3ML SOPN Inject 1.2 mg into the skin daily. Taking 1.2 mg daily    . metFORMIN (GLUCOPHAGE) 500 MG tablet Take 1,000 mg by mouth 2 (two) times daily with a meal.    . Multiple Vitamins-Minerals (MULTIVITAMIN & MINERAL PO) Take 1 tablet by mouth daily.    . rivaroxaban (XARELTO) 20 MG TABS tablet Take 1 tablet (20 mg total) by mouth daily with supper. 30 tablet 0  . Rivaroxaban (XARELTO STARTER PACK) 15 & 20 MG TBPK Take as directed on package: Start with one 2m tablet by mouth twice  a day with food. On Day 22, switch to one 268mtablet once a day with food. (Patient not taking: Reported on 03/01/2015) 51 each 0  . sitaGLIPtin (JANUVIA) 100 MG tablet Take 100 mg by mouth daily. Reported on 09/13/2015     No current facility-administered medications for this visit.    Functional Status:   In your present state of health, do you have any difficulty performing the following activities: 09/13/2015 05/17/2015  Hearing? N N  Vision? N N  Difficulty concentrating or making decisions? N N  Walking or climbing stairs? N N  Dressing or bathing? N N  Doing errands, shopping? N N  Preparing Food and eating ? - -  Using the Toilet? - -  In the past six months, have you accidently leaked urine? - -  Do you have problems with loss of bowel control? - -  Managing your Medications? - -  Managing your Finances? - -  Housekeeping or managing your Housekeeping? - -    Fall/Depression Screening:    PHQ 2/9 Scores 09/13/2015 05/17/2015 03/01/2015 12/21/2014  PHQ - 2 Score 0 0 0 0    Assessment:   Member seen for follow up office visit for Link to Wellness program for self management  of Type 2 diabetes.  Member is maintaining diabetes self management goal of hemoglobin A1C of 7 or less with last reading 6.8.  Member reports adhering to diet, medications and exercise.  Reports improved CBGs since moving to day shift.  Plan:  1. Plan to check blood sugar 1-2 times a day fasting or 2 hours after eating.  Goals of 80-130 before eating and less than 180 after eating. 2. Plan to continue to  eat a bedtime snack of protein and carbohydrate 3. Plan to walk 4-5 times a week for 30-35 minutes.   4. Plan to return to Link to Wellness on 12/07/15 at 3:30PM  [facility] CM Care Plan Problem One        Most Recent Value   Care Plan Problem One  Elevated blood sugars related to dx of Type 2 DM as evidenced by Hemoglobin A1C of 8.6   Role Documenting the Problem One  Care Management [place] for  Problem One  Not Active   THN Long Term Goal (31-90 days)  Member will decrease hemoglobin A1C to 7.0 or below within the next 90 days   THN Long Term Goal Start Date  03/01/15 Maylon Peppers lowered Hemoglobin A1C to 7.5 from 8.6]   [facility] Long Term Goal Met Date  05/17/15 Maylon Peppers has lowered hemoglobin A1C to 6.5]    [facility] CM Care Plan Problem Two        Most Recent Value   Care Plan Problem Two  Potential for elevated blood sugars   Role Documenting the Problem Two  Care Management Coordinator   Care Plan for Problem Two  Active   Interventions for Problem Two Long Term Goal    Reinforced CHO counting and portion control,Instructed on importance of regular dental cleanings and check ups,  Reinforced to exercise regularly and to try to walk when hunting instead of riding the 4 wheeler, Reinforced to always eat when taking his glimepiride and to eat snacks with protein and CHO,     THN Long Term Goal (31-90) days  Member will maintain hemoglobin A1C at or below 7 for the next 90 days   THN Long Term Goal Start Date  09/13/15 [Last hemoglobin A1C 6.8]    Peter Garter RN, [facility] Care Management Coordinator-Link to [place] Management [phone]

## 2015-09-20 MED FILL — INVOKANA 300 MG TABLET: 300 | 30 days supply | Qty: 30 | Fill #11

## 2015-09-20 MED FILL — METFORMIN HCL ER 500 MG TAB: 500 | 90 days supply | Qty: 360 | Fill #0

## 2015-09-20 MED FILL — VICTOZA 18 MG/3 ML INJECT P: 18 | 90 days supply | Qty: 18 | Fill #0

## 2015-09-20 MED FILL — VYTORIN 10/20 TABLET: 10-20 | 90 days supply | Qty: 90 | Fill #0

## 2015-10-11 MED FILL — BD PEN NDL SHORT 31GX5/16: 31G X 8 MM | 90 days supply | Qty: 100 | Fill #1

## 2015-10-21 MED FILL — INVOKANA 300 MG TABLET: 300 | 30 days supply | Qty: 30 | Fill #0

## 2015-11-05 MED FILL — LEVOTHYROXINE 75 MCG TABLET: 75 | 90 days supply | Qty: 90 | Fill #0

## 2015-11-20 MED FILL — GLIMEPIRIDE 4 MG TABLET: 4 | 90 days supply | Qty: 180 | Fill #1

## 2015-11-22 MED FILL — XARELTO 20 MG TABLET: 20 | 90 days supply | Qty: 90 | Fill #3

## 2015-11-22 MED FILL — INVOKANA 300 MG TABLET: 300 | 30 days supply | Qty: 30 | Fill #1

## 2015-12-07 ENCOUNTER — Other Ambulatory Visit: Payer: Self-pay

## 2015-12-07 VITALS — BP 138/88 | HR 84 | Resp 16

## 2015-12-07 DIAGNOSIS — E119 Type 2 diabetes mellitus without complications: Secondary | ICD-10-CM

## 2015-12-07 NOTE — Patient Outreach (Signed)
[facility]) Care Management   12/07/2015  [name] 08-31-1956 [id number]  [name] is an 59 y.o. male.   Member seen for follow up office visit for Link to Wellness program for self management of Type 2 diabetes  Subjective: Member states he has been feeling good other than being stressed at work sometimes.  States that he is taking his lunch to work daily and is eating better.  States that his blood sugars have been stable.  States he is walking at least 4 times a week for 30 minutes and he is push mowing his yard.  Denies any difficulty with his medications.  Objective:   Review of Systems  All other systems reviewed and are negative.   Physical Exam Today's Vitals   12/07/15 1543  BP: 138/88  Pulse: 84  Resp: 16  SpO2: 100%  PainSc: 0-No pain   Encounter Medications:   Outpatient Encounter Prescriptions as of 12/07/2015  Medication Sig Note  . Canagliflozin (INVOKANA PO) Take 300 mg by mouth daily.  12/21/2014: Taking '300mg'$   . ezetimibe-simvastatin (VYTORIN) 10-20 MG per tablet Take 1 tablet by mouth at bedtime.     [name] glimepiride (AMARYL) 4 MG tablet Take 4 mg by mouth 2 (two) times daily.    [name] levothyroxine (SYNTHROID, LEVOTHROID) 75 MCG tablet Take 75 mcg by mouth daily before breakfast.   . Liraglutide 18 MG/3ML SOPN Inject 1.2 mg into the skin daily. Taking 1.2 mg daily   . metFORMIN (GLUCOPHAGE) 500 MG tablet Take 1,000 mg by mouth 2 (two) times daily with a meal.   . Multiple Vitamins-Minerals (MULTIVITAMIN & MINERAL PO) Take 1 tablet by mouth daily.   . rivaroxaban (XARELTO) 20 MG TABS tablet Take 1 tablet (20 mg total) by mouth daily with supper.   . Rivaroxaban (XARELTO STARTER PACK) 15 & 20 MG TBPK Take as directed on package: Start with one '15mg'$  tablet by mouth twice a day with food. On Day 22, switch to one '20mg'$  tablet once a day with food. (Patient not taking: Reported on 03/01/2015)   . sitaGLIPtin (JANUVIA) 100 MG tablet Take 100 mg by  mouth daily. Reported on 12/07/2015    No facility-administered encounter medications on file as of 12/07/2015.    Functional Status:   In your present state of health, do you have any difficulty performing the following activities: 12/07/2015 09/13/2015  Hearing? N N  Vision? N N  Difficulty concentrating or making decisions? N N  Walking or climbing stairs? N N  Dressing or bathing? N N  Doing errands, shopping? N N  Preparing Food and eating ? - -  Using the Toilet? - -  In the past six months, have you accidently leaked urine? - -  Do you have problems with loss of bowel control? - -  Managing your Medications? - -  Managing your Finances? - -  Housekeeping or managing your Housekeeping? - -    Fall/Depression Screening:    PHQ 2/9 Scores 12/07/2015 09/13/2015 05/17/2015 03/01/2015 12/21/2014  PHQ - 2 Score 0 0 0 0 0    Assessment:  Member seen for follow up office visit for Link to Wellness program for self management of Type 2 diabetes. Member is maintaining diabetes self management goal of hemoglobin A1C of 7 or less with last reading 6.8. Member reports adhering to diet, medications and exercise. Member is up to date with annual eye exams and regular dental check ups.  Plan:  Plan to check blood sugar 1-2 times a day fasting or 2 hours after eating.  Goals of 80-130 before eating and less than 180 after eating. Plan to continue to  eat a bedtime snack of protein and carbohydrate Plan to walk 4-5 times a week for 30-35 minutes.   Plan to keep appointment with Dr.Ross on 01/25/16       Plan to return to Link to Wellness on 03/09/16 at 3:30PM  [facility] CM Care Plan Problem Two        Most Recent Value   Care Plan Problem Two  Potential for elevated blood sugars   Role Documenting the Problem Two  Care Management Coordinator   Care Plan for Problem Two  Active   Interventions for Problem Two Long Term Goal    Reinforced CHO counting and portion control,Instructed on foot care and that he  can go to podiatrist to have his toenails trimmed, Reinforced to exercise regularly,  Reinforced to always eat when taking his glimepiride and to eat snacks with protein and CHO,     THN Long Term Goal (31-90) days  Member will maintain hemoglobin A1C at or below 7 for the next 90 days   THN Long Term Goal Start Date  12/07/15 [Maintaining hemoglobin A1C of 6.8]    Peter Garter RN, [facility] Care Management Coordinator-Link to [place] Management [phone]

## 2015-12-07 NOTE — Patient Instructions (Addendum)
1. Plan to check blood sugar 1-2 times a day fasting or 2 hours after eating.  Goals of 80-130 before eating and less than 180 after eating. 2. Plan to continue to  eat a bedtime snack of protein and carbohydrate 3. Plan to walk 4-5 times a week for 30-35 minutes.   4. Plan to keep appointment with Dr.Ross on 01/25/16       5.   Plan to return to Link to Wellness on 03/09/16 at 3:30PM

## 2015-12-20 MED FILL — METFORMIN HCL ER 500 MG TAB: 500 | 90 days supply | Qty: 360 | Fill #1

## 2015-12-20 MED FILL — INVOKANA 300 MG TABLET: 300 | 30 days supply | Qty: 30 | Fill #2

## 2015-12-20 MED FILL — VYTORIN 10/20 TABLET: 10-20 | 90 days supply | Qty: 90 | Fill #1

## 2015-12-30 MED FILL — UNIFINE PENTIPS 8MM 31G: 31G X 8 MM | 90 days supply | Qty: 100 | Fill #2

## 2015-12-30 MED FILL — VICTOZA 18 MG/3 ML INJECT P: 18 | 90 days supply | Qty: 18 | Fill #1

## 2016-01-20 MED FILL — INVOKANA 300 MG TABLET: 300 | 30 days supply | Qty: 30 | Fill #3

## 2016-01-25 DIAGNOSIS — E119 Type 2 diabetes mellitus without complications: Secondary | ICD-10-CM | POA: Diagnosis not present

## 2016-01-25 DIAGNOSIS — Z86711 Personal history of pulmonary embolism: Secondary | ICD-10-CM | POA: Diagnosis not present

## 2016-01-25 DIAGNOSIS — Z0000 Encounter for general adult medical examination without abnormal findings: Secondary | ICD-10-CM | POA: Diagnosis not present

## 2016-01-25 DIAGNOSIS — E032 Hypothyroidism due to medicaments and other exogenous substances: Secondary | ICD-10-CM | POA: Diagnosis not present

## 2016-01-25 DIAGNOSIS — E7800 Pure hypercholesterolemia, unspecified: Secondary | ICD-10-CM | POA: Diagnosis not present

## 2016-01-25 DIAGNOSIS — Z7984 Long term (current) use of oral hypoglycemic drugs: Secondary | ICD-10-CM | POA: Diagnosis not present

## 2016-02-02 MED FILL — LEVOTHYROXINE 75 MCG TABLET: 75 | 90 days supply | Qty: 90 | Fill #1

## 2016-02-17 MED FILL — INVOKANA 300 MG TABLET: 300 | 30 days supply | Qty: 30 | Fill #4

## 2016-02-17 MED FILL — GLIMEPIRIDE 4 MG TABLET: 4 | 90 days supply | Qty: 180 | Fill #0

## 2016-02-18 MED FILL — XARELTO 20 MG TABLET: 20 | 90 days supply | Qty: 90 | Fill #0

## 2016-03-09 ENCOUNTER — Other Ambulatory Visit: Payer: Self-pay

## 2016-03-09 VITALS — BP 142/88 | HR 80 | Resp 16 | Ht 75.0 in | Wt 215.2 lb

## 2016-03-09 DIAGNOSIS — E119 Type 2 diabetes mellitus without complications: Secondary | ICD-10-CM

## 2016-03-09 NOTE — Patient Instructions (Addendum)
1. Plan to check blood sugar 1-2 times a day fasting or 2 hours after eating.  Goals of 80-130 before eating and less than 180 after eating. 2. Plan to continue to  eat a bedtime snack of protein and carbohydrate 3. Plan to walk 3 times a week for 30-35 minutes and mow yard twice a week 4. Plan to consider going to the Employee Assistance Counseling program or doing relaxation exercises 5. Plan to call to schedule eye exam 6. Plan to return to Link to Wellness on 06/15/16 at 3:30PM

## 2016-03-09 NOTE — Patient Outreach (Signed)
[facility]) Care Management   03/09/2016  KELTEN ENOCHS [birth year] [id number]  ADAL SERENO is an 60 y.o. male.   Member seen for follow up office visit for Link to Wellness program for self management of Type 2 diabetes  Subjective: Member states that his hempglobin A1C has gone up when he saw Dr. Harrington Challenger in May.  States that he did not add any new medications as he wants to see if he can improve his reading over the summer.  States that he has been under a lot of stress with helping his daughter while she is getting a divorce and with his job.  States he is eating about the same foods and he is watching his CHO portions.  States he continues to walk in the evening, push mow his yard and hunts when it is in season.  Denies any problems with his medications and reports he is taking as ordered.   States he is checking his blood sugars 1-2 times a day but his readings are higher during the week and lower on the weekend when he is not at work.  Objective:   Review of Systems  All other systems reviewed and are negative.   Physical Exam Today's Vitals   03/09/16 1543  BP: 142/88  Pulse: 80  Resp: 16  Height: 1.905 m ('6\' 3"'$ )  Weight: 215 lb 3.2 oz (97.614 kg)  SpO2: 98%  PainSc: 0-No pain   Encounter Medications:   Outpatient Encounter Prescriptions as of 03/09/2016  Medication Sig Note  . Canagliflozin (INVOKANA PO) Take 300 mg by mouth daily.  12/21/2014: Taking '300mg'$   . ezetimibe-simvastatin (VYTORIN) 10-20 MG per tablet Take 1 tablet by mouth at bedtime.     [name] glimepiride (AMARYL) 4 MG tablet Take 4 mg by mouth 2 (two) times daily.    [name] levothyroxine (SYNTHROID, LEVOTHROID) 75 MCG tablet Take 75 mcg by mouth daily before breakfast.   . Liraglutide 18 MG/3ML SOPN Inject 1.2 mg into the skin daily. Taking 1.2 mg daily   . metFORMIN (GLUCOPHAGE) 500 MG tablet Take 1,000 mg by mouth 2 (two) times daily with a meal.   . Multiple Vitamins-Minerals (MULTIVITAMIN & MINERAL PO)  Take 1 tablet by mouth daily.   . rivaroxaban (XARELTO) 20 MG TABS tablet Take 1 tablet (20 mg total) by mouth daily with supper.   . sitaGLIPtin (JANUVIA) 100 MG tablet Take 100 mg by mouth daily. Reported on 12/07/2015   . Rivaroxaban (XARELTO STARTER PACK) 15 & 20 MG TBPK Take as directed on package: Start with one '15mg'$  tablet by mouth twice a day with food. On Day 22, switch to one '20mg'$  tablet once a day with food. (Patient not taking: Reported on 03/01/2015)    No facility-administered encounter medications on file as of 03/09/2016.    Functional Status:   In your present state of health, do you have any difficulty performing the following activities: 03/09/2016 12/07/2015  Hearing? N N  Vision? N N  Difficulty concentrating or making decisions? N N  Walking or climbing stairs? N N  Dressing or bathing? N N  Doing errands, shopping? N N    Fall/Depression Screening:    PHQ 2/9 Scores 03/09/2016 12/07/2015 09/13/2015 05/17/2015 03/01/2015 12/21/2014  PHQ - 2 Score 0 0 0 0 0 0    Assessment:  Member seen for follow up office visit for Link to Wellness program for self management of Type 2 diabetes. Member has not been meeting his  diabetes self management goal of hemoglobin A1C of 7% or less with last reading increased to 8.6%.Member reports no changes in routine other than having a lot of stress with daughter's divorce and work.  Member reports adhering to diet, medications and exercise. Member past due for  annual eye exams.  He is up to date with  regular dental check ups.   Plan:  Plan to check blood sugar 1-2 times a day fasting or 2 hours after eating.  Goals of 80-130 before eating and less than 180 after eating. Plan to continue to  eat a bedtime snack of protein and carbohydrate Plan to walk 3 times a week for 30-35 minutes and mow yard twice a week Plan to consider going to the [place] program or doing relaxation exercises Plan to call to schedule eye exam Plan to  return to Link to Wellness on 06/15/16 at 3:30PM  [facility] CM Care Plan Problem Two        Most Recent Value   Care Plan Problem Two  Ineffective management of diabetes self management as evidenced by elevated hemoglobin A1C of 8.6% related to dx of Type 2 DM   Role Documenting the Problem Two  Care Management Coordinator   Care Plan for Problem Two  Active   Interventions for Problem Two Long Term Goal    Reinforced CHO counting and portion control,Instructed on how stress can effect his blood sugars and discussed ways to help deal with his stress, Given written information on the Employee Assistance Counseling program and phone applications that he can use to do relaxation exericses, Instructed to not go bare foot especially when at the beach,  Reinforced to exercise regularly,  Reinforced to always eat when taking his glimepiride and to eat snacks with protein and CHO,     THN Long Term Goal (31-90) days  Member will decrease hemoglobin A1C by 1% point in the next 90 days   THN Long Term Goal Start Date  03/09/16 [Continue hemoglobin A1C increased to 8.6]    Peter Garter RN, [facility] Care Management Coordinator-Link to [place] Management [phone]

## 2016-03-13 MED FILL — METFORMIN HCL ER 500 MG TAB: 500 | 90 days supply | Qty: 360 | Fill #0

## 2016-03-13 MED FILL — INVOKANA 300 MG TABLET: 300 | 30 days supply | Qty: 30 | Fill #5

## 2016-03-14 MED FILL — EZETIMIBE-SIMVASTATIN 10-20: 10-20 | 90 days supply | Qty: 90 | Fill #0

## 2016-04-04 MED FILL — VICTOZA 18 MG/3 ML INJECT P: 18 | 45 days supply | Qty: 9 | Fill #0

## 2016-04-19 MED FILL — INVOKANA 300 MG TABLET: 300 | 30 days supply | Qty: 30 | Fill #0

## 2016-05-05 MED FILL — UNIFINE PENTIPS 8MM 31G: 31G X 8 MM | 90 days supply | Qty: 100 | Fill #3

## 2016-05-05 MED FILL — CONTOUR NEXT STRIPS: 50 days supply | Qty: 100 | Fill #0

## 2016-05-05 MED FILL — MICROLET LANCETS: 90 days supply | Qty: 100 | Fill #0

## 2016-05-05 MED FILL — LEVOTHYROXINE 75 MCG TABLET: 75 | 90 days supply | Qty: 90 | Fill #0

## 2016-05-22 MED FILL — GLIMEPIRIDE 4 MG TABLET: 4 | 90 days supply | Qty: 180 | Fill #1

## 2016-05-22 MED FILL — XARELTO 20 MG TABLET: 20 | 90 days supply | Qty: 90 | Fill #1

## 2016-05-22 MED FILL — INVOKANA 300 MG TABLET: 300 | 30 days supply | Qty: 30 | Fill #1

## 2016-06-01 MED FILL — VICTOZA 18 MG/3 ML INJECT P: 18 | 45 days supply | Qty: 9 | Fill #1

## 2016-06-15 ENCOUNTER — Ambulatory Visit: Payer: 59

## 2016-06-20 MED FILL — EZETIMIBE-SIMVASTATIN 10-20: 10-20 | 90 days supply | Qty: 90 | Fill #1

## 2016-06-20 MED FILL — METFORMIN HCL ER 500 MG TAB: 500 | 90 days supply | Qty: 360 | Fill #1

## 2016-06-20 MED FILL — INVOKANA 300 MG TABLET: 300 | 30 days supply | Qty: 30 | Fill #2

## 2016-07-17 ENCOUNTER — Other Ambulatory Visit: Payer: Self-pay

## 2016-07-17 VITALS — BP 138/84 | HR 88 | Resp 16 | Ht 75.0 in | Wt 215.2 lb

## 2016-07-17 DIAGNOSIS — E119 Type 2 diabetes mellitus without complications: Secondary | ICD-10-CM

## 2016-07-17 NOTE — Patient Outreach (Signed)
[facility]) Care Management   07/17/2016  [name] 10/20/1955 [id number]  [name] is an 60 y.o. male.   Member seen for follow up office visit for Link to Wellness program for self management of Type 2 diabetes  Subjective: member states that he is to see Dr. Harrington Challenger on Monday.  States he wants to discuss with him his options for medications for his diabetes as he feels his blood sugars have not improved with the medications he is taking.  States he is OK with taking insulin if that is what it takes to help him control his blood sugars.  States he is watching his diet and is getting a lot of walking in since it is deer hunting season.  States his blood sugars range in the 170s in the morning and about 220 after meals.  Objective:   Review of Systems  Musculoskeletal: Positive for joint pain.  All other systems reviewed and are negative.   Physical Exam Today's Vitals   07/17/16 1536 07/17/16 1540  BP: 138/84   Pulse: 88   Resp: 16   SpO2: 97%   Weight: 215 lb 3.2 oz (97.6 kg)   Height: 1.905 m ('6\' 3"'$ )   PainSc: 0-No pain 0-No pain   Encounter Medications:   Outpatient Encounter Prescriptions as of 07/17/2016  Medication Sig Note  . Canagliflozin (INVOKANA PO) Take 300 mg by mouth daily.  12/21/2014: Taking '300mg'$   . ezetimibe-simvastatin (VYTORIN) 10-20 MG per tablet Take 1 tablet by mouth at bedtime.     [name] glimepiride (AMARYL) 4 MG tablet Take 4 mg by mouth 2 (two) times daily.    [name] levothyroxine (SYNTHROID, LEVOTHROID) 75 MCG tablet Take 75 mcg by mouth daily before breakfast.   . Liraglutide 18 MG/3ML SOPN Inject 1.2 mg into the skin daily. Taking 1.2 mg daily   . metFORMIN (GLUCOPHAGE) 500 MG tablet Take 1,000 mg by mouth 2 (two) times daily with a meal.   . Multiple Vitamins-Minerals (MULTIVITAMIN & MINERAL PO) Take 1 tablet by mouth daily.   . rivaroxaban (XARELTO) 20 MG TABS tablet Take 1 tablet (20 mg total) by mouth daily with supper.   .  Rivaroxaban (XARELTO STARTER PACK) 15 & 20 MG TBPK Take as directed on package: Start with one '15mg'$  tablet by mouth twice a day with food. On Day 22, switch to one '20mg'$  tablet once a day with food. (Patient not taking: Reported on 07/17/2016)   . sitaGLIPtin (JANUVIA) 100 MG tablet Take 100 mg by mouth daily. Reported on 12/07/2015    No facility-administered encounter medications on file as of 07/17/2016.     Functional Status:   In your present state of health, do you have any difficulty performing the following activities: 07/17/2016 03/09/2016  Hearing? N N  Vision? N N  Difficulty concentrating or making decisions? N N  Walking or climbing stairs? N N  Dressing or bathing? N N  Doing errands, shopping? N N  Some recent data might be hidden    Fall/Depression Screening:    PHQ 2/9 Scores 07/17/2016 03/09/2016 12/07/2015 09/13/2015 05/17/2015 03/01/2015 12/21/2014  PHQ - 2 Score 0 0 0 0 0 0 0    Assessment:  Member seen for follow up office visit for Link to Wellness program for self management of Type 2 diabetes. Member has not been meeting his  diabetes self management goal of hemoglobin A1C of 7% or less with last reading increased to 8.6%. Member reports adhering  to diet, medications and exercise. Member is willing to start taking insulin if MD recommends as he is not seeing an improvement in his blood sugars with current treatment. Member had his  annual eye exam.  He is up to date with  regular dental check ups.   Plan:   1. Plan to check blood sugar 1-2 times a day fasting or 2 hours after eating.  Goals of 80-130 before eating and less than 180 after eating. 2. Plan to continue to eat a bedtime snack of protein and carbohydrate 3. Plan to walk 3 times a week for 30-35 minutes and deer hunting 1-2 times a week 4. Plan to keep appt with Dr. Harrington Challenger on 11/201/7 5. Plan to enroll in the [facility] program 6. Plan to be followed by the [facility] program  [facility] CM Care Plan Problem Two    Flowsheet Row Most Recent Value  Care Plan Problem Two  Ineffective management of diabetes self management as evidenced by elevated hemoglobin A1C of 8.6% related to dx of Type 2 DM  Role Documenting the Problem Two  Care Management Coordinator  Care Plan for Problem Two  Active  Interventions for Problem Two Long Term Goal    Reinforced CHO counting and portion control,Instructed that he may need to start taking  a basal dose of insulin and encouraged to discuss with MD at his appt on 07/24/16, Instructed on the progressive nature of diabetes and how his medications can change as it progresses, Reinforced to exercise regularly,  Reinforced to always eat when taking his glimepiride and to eat snacks with protein and CHO, Given handout on the [facility] program and instructed on how to register for the program    [facility] Long Term Goal (31-90) days  Member will decrease hemoglobin A1C by 1% point in the next 90 days  [facility] Long Term Goal Start Date  07/17/16 [Continue hemoglobin A1C increased to 8.6]    Peter Garter RN, [facility] Care Management Coordinator-Link to [place] Management [phone]

## 2016-07-17 NOTE — Patient Instructions (Signed)
1. Plan to check blood sugar 1-2 times a day fasting or 2 hours after eating.  Goals of 80-130 before eating and less than 180 after eating. 2. Plan to continue to eat a bedtime snack of protein and carbohydrate 3. Plan to walk 3 times a week for 30-35 minutes and deer hunting 1-2 times a week 4. Plan to enroll in the [facility] program 5. Plan to be followed by the [facility] program

## 2016-07-20 MED FILL — INVOKANA 300 MG TABLET: 300 | 30 days supply | Qty: 30 | Fill #3

## 2016-07-21 MED FILL — VICTOZA 18 MG/3 ML INJECT P: 18 | 45 days supply | Qty: 9 | Fill #2

## 2016-07-24 DIAGNOSIS — Z23 Encounter for immunization: Secondary | ICD-10-CM | POA: Diagnosis not present

## 2016-07-24 DIAGNOSIS — L570 Actinic keratosis: Secondary | ICD-10-CM | POA: Diagnosis not present

## 2016-07-24 DIAGNOSIS — E119 Type 2 diabetes mellitus without complications: Secondary | ICD-10-CM | POA: Diagnosis not present

## 2016-07-24 DIAGNOSIS — Z7984 Long term (current) use of oral hypoglycemic drugs: Secondary | ICD-10-CM | POA: Diagnosis not present

## 2016-08-02 MED FILL — LEVOTHYROXINE 75 MCG TABLET: 75 | 90 days supply | Qty: 90 | Fill #1

## 2016-08-17 MED FILL — XARELTO 20 MG TABLET: 20 | 90 days supply | Qty: 90 | Fill #0

## 2016-08-17 MED FILL — INVOKANA 300 MG TABLET: 300 | 30 days supply | Qty: 30 | Fill #4

## 2016-08-21 MED FILL — GLIMEPIRIDE 4 MG TABLET: 4 | 90 days supply | Qty: 180 | Fill #2

## 2016-09-13 ENCOUNTER — Other Ambulatory Visit (HOSPITAL_COMMUNITY): Payer: Self-pay | Admitting: Orthopedic Surgery

## 2016-09-13 DIAGNOSIS — M7541 Impingement syndrome of right shoulder: Secondary | ICD-10-CM

## 2016-09-14 MED FILL — EZETIMIBE-SIMVASTATIN 10-20: 10-20 | 90 days supply | Qty: 90 | Fill #2

## 2016-09-14 MED FILL — INVOKANA 300 MG TABLET: 300 | 30 days supply | Qty: 30 | Fill #5

## 2016-09-15 MED FILL — METFORMIN HCL ER 500 MG TAB: 500 | 90 days supply | Qty: 360 | Fill #2

## 2016-09-18 MED FILL — UNIFINE PENTIPS 8MM 31G: 31G X 8 MM | 90 days supply | Qty: 100 | Fill #0

## 2016-09-19 ENCOUNTER — Ambulatory Visit (HOSPITAL_COMMUNITY)
Admission: RE | Admit: 2016-09-19 | Discharge: 2016-09-19 | Disposition: A | Discharge status: Home / Self Care | Payer: 59 | Source: Ambulatory Visit | Attending: Orthopedic Surgery | Admitting: Orthopedic Surgery

## 2016-09-19 DIAGNOSIS — M25511 Pain in right shoulder: Secondary | ICD-10-CM | POA: Diagnosis not present

## 2016-09-19 DIAGNOSIS — M19011 Primary osteoarthritis, right shoulder: Secondary | ICD-10-CM | POA: Insufficient documentation

## 2016-09-19 DIAGNOSIS — M7541 Impingement syndrome of right shoulder: Secondary | ICD-10-CM | POA: Diagnosis not present

## 2016-09-19 DIAGNOSIS — M75111 Incomplete rotator cuff tear or rupture of right shoulder, not specified as traumatic: Secondary | ICD-10-CM | POA: Insufficient documentation

## 2016-09-27 DIAGNOSIS — M75121 Complete rotator cuff tear or rupture of right shoulder, not specified as traumatic: Secondary | ICD-10-CM | POA: Diagnosis not present

## 2016-09-27 MED FILL — VICTOZA 18 MG/3 ML INJECT P: 18 | 45 days supply | Qty: 9 | Fill #3

## 2016-10-02 DIAGNOSIS — Z86711 Personal history of pulmonary embolism: Secondary | ICD-10-CM | POA: Diagnosis not present

## 2016-10-02 DIAGNOSIS — D689 Coagulation defect, unspecified: Secondary | ICD-10-CM | POA: Diagnosis not present

## 2016-10-02 DIAGNOSIS — M25519 Pain in unspecified shoulder: Secondary | ICD-10-CM | POA: Diagnosis not present

## 2016-10-02 MED FILL — ENOXAPARIN 100 MG/ML SYR: 100 | 10 days supply | Qty: 10 | Fill #0

## 2016-10-17 MED FILL — INVOKANA 300 MG TABLET: 300 | 30 days supply | Qty: 30 | Fill #6

## 2016-10-20 NOTE — Pre-Procedure Instructions (Signed)
    LAVERNE KLUGH  10/20/2016      [place], [place] [address] Phone: [phone] Fax: [phone]    Your procedure is scheduled on 10/26/16.  Report to [facility] Admitting at 1030 A.M.  Call this number if you have problems the morning of surgery:  [phone]   Remember:  Do not eat food or drink liquids after midnight.  Take these medicines the morning of surgery with A SIP OF WATER --synthroid   Do not wear jewelry, make-up or nail polish.  Do not wear lotions, powders, or perfumes, or deoderant.  Do not shave 48 hours prior to surgery.  Men may shave face and neck.  Do not bring valuables to the hospital.  [facility] is not responsible for any belongings or valuables.  Contacts, dentures or bridgework may not be worn into surgery.  Leave your suitcase in the car.  After surgery it may be brought to your room.  For patients admitted to the hospital, discharge time will be determined by your treatment team.  Patients discharged the day of surgery will not be allowed to drive home.   Name and phone number of your driver:    Special instructions:    Please read over the following fact sheets that you were given. MRSA Information

## 2016-10-23 ENCOUNTER — Encounter (HOSPITAL_COMMUNITY)
Admission: RE | Admit: 2016-10-23 | Discharge: 2016-10-23 | Disposition: A | Discharge status: Home / Self Care | Payer: 59 | Source: Ambulatory Visit | Attending: Orthopedic Surgery | Admitting: Orthopedic Surgery

## 2016-10-23 ENCOUNTER — Encounter (HOSPITAL_COMMUNITY): Payer: Self-pay

## 2016-10-23 DIAGNOSIS — Z9221 Personal history of antineoplastic chemotherapy: Secondary | ICD-10-CM | POA: Diagnosis not present

## 2016-10-23 DIAGNOSIS — Z86711 Personal history of pulmonary embolism: Secondary | ICD-10-CM | POA: Diagnosis not present

## 2016-10-23 DIAGNOSIS — E119 Type 2 diabetes mellitus without complications: Secondary | ICD-10-CM

## 2016-10-23 DIAGNOSIS — Z01818 Encounter for other preprocedural examination: Secondary | ICD-10-CM | POA: Insufficient documentation

## 2016-10-23 DIAGNOSIS — M19011 Primary osteoarthritis, right shoulder: Secondary | ICD-10-CM

## 2016-10-23 DIAGNOSIS — Z85810 Personal history of malignant neoplasm of tongue: Secondary | ICD-10-CM | POA: Diagnosis not present

## 2016-10-23 DIAGNOSIS — Z96652 Presence of left artificial knee joint: Secondary | ICD-10-CM | POA: Diagnosis not present

## 2016-10-23 DIAGNOSIS — Z7984 Long term (current) use of oral hypoglycemic drugs: Secondary | ICD-10-CM | POA: Diagnosis not present

## 2016-10-23 DIAGNOSIS — M75101 Unspecified rotator cuff tear or rupture of right shoulder, not specified as traumatic: Secondary | ICD-10-CM | POA: Insufficient documentation

## 2016-10-23 DIAGNOSIS — E785 Hyperlipidemia, unspecified: Secondary | ICD-10-CM | POA: Diagnosis not present

## 2016-10-23 DIAGNOSIS — M65811 Other synovitis and tenosynovitis, right shoulder: Secondary | ICD-10-CM | POA: Diagnosis not present

## 2016-10-23 DIAGNOSIS — M7541 Impingement syndrome of right shoulder: Secondary | ICD-10-CM

## 2016-10-23 DIAGNOSIS — Z923 Personal history of irradiation: Secondary | ICD-10-CM | POA: Diagnosis not present

## 2016-10-23 DIAGNOSIS — Z01812 Encounter for preprocedural laboratory examination: Secondary | ICD-10-CM | POA: Insufficient documentation

## 2016-10-23 DIAGNOSIS — F329 Major depressive disorder, single episode, unspecified: Secondary | ICD-10-CM | POA: Diagnosis not present

## 2016-10-23 DIAGNOSIS — E039 Hypothyroidism, unspecified: Secondary | ICD-10-CM | POA: Diagnosis not present

## 2016-10-23 DIAGNOSIS — X58XXXA Exposure to other specified factors, initial encounter: Secondary | ICD-10-CM | POA: Diagnosis not present

## 2016-10-23 DIAGNOSIS — M75121 Complete rotator cuff tear or rupture of right shoulder, not specified as traumatic: Secondary | ICD-10-CM | POA: Diagnosis not present

## 2016-10-23 DIAGNOSIS — E1151 Type 2 diabetes mellitus with diabetic peripheral angiopathy without gangrene: Secondary | ICD-10-CM | POA: Diagnosis not present

## 2016-10-23 DIAGNOSIS — S43431A Superior glenoid labrum lesion of right shoulder, initial encounter: Secondary | ICD-10-CM | POA: Diagnosis not present

## 2016-10-23 LAB — BASIC METABOLIC PANEL
ANION GAP: 10 (ref 5–15)
BUN: 21 mg/dL — AB (ref 6–20)
CO2: 26 mmol/L (ref 22–32)
Calcium: 9.5 mg/dL (ref 8.9–10.3)
Chloride: 102 mmol/L (ref 101–111)
Creatinine, Ser: 1.14 mg/dL (ref 0.61–1.24)
GFR calc Af Amer: >60 mL/min (ref >60)
GLUCOSE: 150 mg/dL — AB (ref 65–99)
POTASSIUM: 4.6 mmol/L (ref 3.5–5.1)
Sodium: 138 mmol/L (ref 135–145)

## 2016-10-23 LAB — CBC
HEMATOCRIT: 45.6 % (ref 39.0–52.0)
Hemoglobin: 15.2 g/dL (ref 13.0–17.0)
MCH: 29.3 pg (ref 26.0–34.0)
MCHC: 33.3 g/dL (ref 30.0–36.0)
MCV: 88 fL (ref 78.0–100.0)
PLATELETS: 206 10*3/uL (ref 150–400)
RBC: 5.18 MIL/uL (ref 4.22–5.81)
RDW: 12.7 % (ref 11.5–15.5)
WBC: 6.1 10*3/uL (ref 4.0–10.5)

## 2016-10-23 LAB — GLUCOSE, CAPILLARY: Glucose-Capillary: 128 mg/dL — ABNORMAL HIGH (ref 65–99)

## 2016-10-24 LAB — HEMOGLOBIN A1C
Hgb A1c MFr Bld: 7.8 % — ABNORMAL HIGH (ref 4.8–5.6)
Mean Plasma Glucose: 177 mg/dL

## 2016-10-26 ENCOUNTER — Encounter (HOSPITAL_COMMUNITY)
Admission: RE | Disposition: A | Discharge status: Home / Self Care | Payer: Self-pay | Source: Ambulatory Visit | Attending: Orthopedic Surgery

## 2016-10-26 ENCOUNTER — Encounter (HOSPITAL_COMMUNITY): Payer: Self-pay | Admitting: Critical Care Medicine

## 2016-10-26 ENCOUNTER — Ambulatory Visit (HOSPITAL_COMMUNITY): Payer: 59 | Admitting: Critical Care Medicine

## 2016-10-26 ENCOUNTER — Ambulatory Visit (HOSPITAL_COMMUNITY)
Admission: RE | Admit: 2016-10-26 | Discharge: 2016-10-26 | Disposition: A | Discharge status: Home / Self Care | Payer: 59 | Source: Ambulatory Visit | Attending: Orthopedic Surgery | Admitting: Orthopedic Surgery

## 2016-10-26 DIAGNOSIS — E039 Hypothyroidism, unspecified: Secondary | ICD-10-CM | POA: Diagnosis not present

## 2016-10-26 DIAGNOSIS — M65811 Other synovitis and tenosynovitis, right shoulder: Secondary | ICD-10-CM | POA: Insufficient documentation

## 2016-10-26 DIAGNOSIS — M7541 Impingement syndrome of right shoulder: Secondary | ICD-10-CM | POA: Insufficient documentation

## 2016-10-26 DIAGNOSIS — Z7984 Long term (current) use of oral hypoglycemic drugs: Secondary | ICD-10-CM | POA: Insufficient documentation

## 2016-10-26 DIAGNOSIS — E1151 Type 2 diabetes mellitus with diabetic peripheral angiopathy without gangrene: Secondary | ICD-10-CM | POA: Diagnosis not present

## 2016-10-26 DIAGNOSIS — Z9221 Personal history of antineoplastic chemotherapy: Secondary | ICD-10-CM | POA: Insufficient documentation

## 2016-10-26 DIAGNOSIS — F329 Major depressive disorder, single episode, unspecified: Secondary | ICD-10-CM | POA: Insufficient documentation

## 2016-10-26 DIAGNOSIS — Z923 Personal history of irradiation: Secondary | ICD-10-CM | POA: Insufficient documentation

## 2016-10-26 DIAGNOSIS — Z9861 Coronary angioplasty status: Secondary | ICD-10-CM | POA: Diagnosis not present

## 2016-10-26 DIAGNOSIS — S43431A Superior glenoid labrum lesion of right shoulder, initial encounter: Secondary | ICD-10-CM | POA: Insufficient documentation

## 2016-10-26 DIAGNOSIS — X58XXXA Exposure to other specified factors, initial encounter: Secondary | ICD-10-CM | POA: Insufficient documentation

## 2016-10-26 DIAGNOSIS — M19011 Primary osteoarthritis, right shoulder: Secondary | ICD-10-CM | POA: Diagnosis not present

## 2016-10-26 DIAGNOSIS — Z86711 Personal history of pulmonary embolism: Secondary | ICD-10-CM | POA: Insufficient documentation

## 2016-10-26 DIAGNOSIS — M75121 Complete rotator cuff tear or rupture of right shoulder, not specified as traumatic: Secondary | ICD-10-CM | POA: Diagnosis not present

## 2016-10-26 DIAGNOSIS — Z85810 Personal history of malignant neoplasm of tongue: Secondary | ICD-10-CM | POA: Insufficient documentation

## 2016-10-26 DIAGNOSIS — M75101 Unspecified rotator cuff tear or rupture of right shoulder, not specified as traumatic: Secondary | ICD-10-CM | POA: Diagnosis not present

## 2016-10-26 DIAGNOSIS — E785 Hyperlipidemia, unspecified: Secondary | ICD-10-CM | POA: Insufficient documentation

## 2016-10-26 DIAGNOSIS — Z859 Personal history of malignant neoplasm, unspecified: Secondary | ICD-10-CM | POA: Diagnosis not present

## 2016-10-26 DIAGNOSIS — G8918 Other acute postprocedural pain: Secondary | ICD-10-CM | POA: Diagnosis not present

## 2016-10-26 DIAGNOSIS — Z96652 Presence of left artificial knee joint: Secondary | ICD-10-CM | POA: Insufficient documentation

## 2016-10-26 DIAGNOSIS — Z7901 Long term (current) use of anticoagulants: Secondary | ICD-10-CM | POA: Diagnosis not present

## 2016-10-26 DIAGNOSIS — I890 Lymphedema, not elsewhere classified: Secondary | ICD-10-CM | POA: Diagnosis not present

## 2016-10-26 HISTORY — PX: SHOULDER ARTHROSCOPY WITH ROTATOR CUFF REPAIR AND SUBACROMIAL DECOMPRESSION: SHX5686

## 2016-10-26 HISTORY — PX: SHOULDER ARTHROSCOPY WITH DISTAL CLAVICLE RESECTION: SHX5675

## 2016-10-26 LAB — GLUCOSE, CAPILLARY: Glucose-Capillary: 121 mg/dL — ABNORMAL HIGH (ref 65–99)

## 2016-10-26 SURGERY — SHOULDER ARTHROSCOPY WITH ROTATOR CUFF REPAIR AND SUBACROMIAL DECOMPRESSION
Anesthesia: Regional | Site: Shoulder | Laterality: Right

## 2016-10-26 MED ORDER — SUGAMMADEX SODIUM 200 MG/2ML IV SOLN
INTRAVENOUS | Status: DC | PRN
  Administered 2016-10-26: 200 mg via INTRAVENOUS

## 2016-10-26 MED ORDER — PHENYLEPHRINE HCL 10 MG/ML IJ SOLN
INTRAVENOUS | Status: DC | PRN
  Administered 2016-10-26: 10 ug/min via INTRAVENOUS

## 2016-10-26 MED ORDER — EPHEDRINE 5 MG/ML INJ
INTRAVENOUS | Status: AC
  Filled 2016-10-26: qty 20

## 2016-10-26 MED ORDER — ONDANSETRON HCL 4 MG PO TABS
4.0000 mg | ORAL_TABLET | Freq: Three times a day (TID) | ORAL | 0 refills | Status: DC | PRN
Start: 2016-10-26 — End: 2020-06-05

## 2016-10-26 MED ORDER — MIDAZOLAM HCL 2 MG/2ML IJ SOLN
INTRAMUSCULAR | Status: AC
  Filled 2016-10-26: qty 2

## 2016-10-26 MED ORDER — PROPOFOL 10 MG/ML IV BOLUS
INTRAVENOUS | Status: DC | PRN
  Administered 2016-10-26: 140 mg via INTRAVENOUS
  Administered 2016-10-26 (×2): 20 mg via INTRAVENOUS

## 2016-10-26 MED ORDER — DEXAMETHASONE SODIUM PHOSPHATE 10 MG/ML IJ SOLN
INTRAMUSCULAR | Status: AC
  Filled 2016-10-26: qty 2

## 2016-10-26 MED ORDER — SODIUM CHLORIDE 0.9 % IR SOLN
Status: DC | PRN
  Administered 2016-10-26 (×3): 3000 mL
  Administered 2016-10-26: 1000 mL

## 2016-10-26 MED ORDER — PHENYLEPHRINE 40 MCG/ML (10ML) SYRINGE FOR IV PUSH (FOR BLOOD PRESSURE SUPPORT)
PREFILLED_SYRINGE | INTRAVENOUS | Status: AC
  Filled 2016-10-26: qty 20

## 2016-10-26 MED ORDER — FENTANYL CITRATE (PF) 100 MCG/2ML IJ SOLN
50.0000 ug | Freq: Once | INTRAMUSCULAR | Status: AC
Start: 2016-10-26 — End: 2016-10-26
  Administered 2016-10-26: 50 ug via INTRAVENOUS
  Filled 2016-10-26: qty 1

## 2016-10-26 MED ORDER — LACTATED RINGERS IV SOLN
INTRAVENOUS | Status: DC
  Administered 2016-10-26 (×2): via INTRAVENOUS

## 2016-10-26 MED ORDER — MIDAZOLAM HCL 2 MG/2ML IJ SOLN
INTRAMUSCULAR | Status: AC
  Administered 2016-10-26: 1 mg via INTRAVENOUS
  Filled 2016-10-26: qty 2

## 2016-10-26 MED ORDER — LIDOCAINE 2% (20 MG/ML) 5 ML SYRINGE
INTRAMUSCULAR | Status: AC
  Filled 2016-10-26: qty 15

## 2016-10-26 MED ORDER — ONDANSETRON HCL 4 MG/2ML IJ SOLN
INTRAMUSCULAR | Status: AC
  Filled 2016-10-26: qty 6

## 2016-10-26 MED ORDER — FENTANYL CITRATE (PF) 100 MCG/2ML IJ SOLN
INTRAMUSCULAR | Status: AC
  Administered 2016-10-26: 50 ug via INTRAVENOUS
  Filled 2016-10-26: qty 2

## 2016-10-26 MED ORDER — FENTANYL CITRATE (PF) 100 MCG/2ML IJ SOLN
INTRAMUSCULAR | Status: AC
  Filled 2016-10-26: qty 2

## 2016-10-26 MED ORDER — SUCCINYLCHOLINE CHLORIDE 200 MG/10ML IV SOSY
PREFILLED_SYRINGE | INTRAVENOUS | Status: AC
  Filled 2016-10-26: qty 10

## 2016-10-26 MED ORDER — DEXAMETHASONE SODIUM PHOSPHATE 10 MG/ML IJ SOLN
INTRAMUSCULAR | Status: DC | PRN
  Administered 2016-10-26: 4 mg via INTRAVENOUS

## 2016-10-26 MED ORDER — ROCURONIUM BROMIDE 50 MG/5ML IV SOSY
PREFILLED_SYRINGE | INTRAVENOUS | Status: DC | PRN
  Administered 2016-10-26: 50 mg via INTRAVENOUS

## 2016-10-26 MED ORDER — HYDROMORPHONE HCL 1 MG/ML IJ SOLN: 0.2500 mg | INTRAMUSCULAR | Status: DC | PRN

## 2016-10-26 MED ORDER — CEFAZOLIN SODIUM-DEXTROSE 2-4 GM/100ML-% IV SOLN
2.0000 g | INTRAVENOUS | Status: AC
  Administered 2016-10-26: 2 g via INTRAVENOUS
  Filled 2016-10-26: qty 100

## 2016-10-26 MED ORDER — MIDAZOLAM HCL 2 MG/2ML IJ SOLN
1.0000 mg | Freq: Once | INTRAMUSCULAR | Status: AC
  Administered 2016-10-26: 1 mg via INTRAVENOUS
  Filled 2016-10-26: qty 1

## 2016-10-26 MED ORDER — ONDANSETRON HCL 4 MG/2ML IJ SOLN
INTRAMUSCULAR | Status: DC | PRN
Start: 2016-10-26 — End: 2016-10-26
  Administered 2016-10-26: 4 mg via INTRAVENOUS

## 2016-10-26 MED ORDER — ARTIFICIAL TEARS OP OINT
TOPICAL_OINTMENT | OPHTHALMIC | Status: AC
  Filled 2016-10-26: qty 3.5

## 2016-10-26 MED ORDER — DIAZEPAM 5 MG PO TABS: 2.5000 mg | ORAL_TABLET | Freq: Four times a day (QID) | ORAL | 1 refills | Status: DC | PRN

## 2016-10-26 MED ORDER — CHLORHEXIDINE GLUCONATE 4 % EX LIQD: 60.0000 mL | Freq: Once | CUTANEOUS | Status: DC

## 2016-10-26 MED ORDER — ROCURONIUM BROMIDE 50 MG/5ML IV SOSY
PREFILLED_SYRINGE | INTRAVENOUS | Status: AC
  Filled 2016-10-26: qty 20

## 2016-10-26 MED ORDER — PROPOFOL 10 MG/ML IV BOLUS
INTRAVENOUS | Status: AC
  Filled 2016-10-26: qty 20

## 2016-10-26 MED ORDER — BUPIVACAINE-EPINEPHRINE (PF) 0.5% -1:200000 IJ SOLN
INTRAMUSCULAR | Status: DC | PRN
  Administered 2016-10-26: 30 mL via PERINEURAL

## 2016-10-26 MED ORDER — SODIUM CHLORIDE 0.9 % IR SOLN
Status: DC | PRN
  Administered 2016-10-26: 1000 mL

## 2016-10-26 MED ORDER — OXYCODONE-ACETAMINOPHEN 5-325 MG PO TABS: 1.0000 | ORAL_TABLET | ORAL | 0 refills | Status: DC | PRN

## 2016-10-26 MED FILL — ONDANSETRON HCL 4 MG TABLET: 4 | 6 days supply | Qty: 20 | Fill #0

## 2016-10-26 MED FILL — OXYCODONE/APAP 5/325 MG TAB: 5-325 | 4 days supply | Qty: 40 | Fill #0

## 2016-10-26 MED FILL — diazePAM 5 MG TABS: 5 | 10 days supply | Qty: 40 | Fill #0

## 2016-10-26 SURGICAL SUPPLY — 67 items
ANCHOR PEEK 4.75X19.1 SWLK C (Anchor) ×6 IMPLANT
BLADE CUTTER GATOR 3.5 (BLADE) ×2 IMPLANT
BLADE GREAT WHITE 4.2 (BLADE) ×2 IMPLANT
BLADE SURG 11 STRL SS (BLADE) ×2 IMPLANT
BOOTCOVER CLEANROOM LRG (PROTECTIVE WEAR) ×4 IMPLANT
BUR OVAL 4.0 (BURR) ×2 IMPLANT
CANISTER OMNI JUG 16 LITER (MISCELLANEOUS) ×2 IMPLANT
CANISTER SUCT LVC 12 LTR MEDI- (MISCELLANEOUS) ×2 IMPLANT
CANNULA ACUFLEX KIT 5X76 (CANNULA) ×2 IMPLANT
CANNULA DRILOCK 5.0X75 (CANNULA) ×4 IMPLANT
CANNULA TWIST IN 8.25X7CM (CANNULA) ×2 IMPLANT
CLSR STERI-STRIP ANTIMIC 1/2X4 (GAUZE/BANDAGES/DRESSINGS) ×2 IMPLANT
CONNECTOR 5 IN 1 STRAIGHT STRL (MISCELLANEOUS) ×2 IMPLANT
DRAPE INCISE 23X17 IOBAN STRL (DRAPES)
DRAPE INCISE IOBAN 23X17 STRL (DRAPES) IMPLANT
DRAPE INCISE IOBAN 66X45 STRL (DRAPES) ×2 IMPLANT
DRAPE ORTHO SPLIT 77X108 STRL (DRAPES) ×2
DRAPE STERI 35X30 U-POUCH (DRAPES) ×2 IMPLANT
DRAPE SURG 17X11 SM STRL (DRAPES) ×2 IMPLANT
DRAPE SURG ORHT 6 SPLT 77X108 (DRAPES) ×2 IMPLANT
DRAPE U-SHAPE 47X51 STRL (DRAPES) ×2 IMPLANT
DRSG PAD ABDOMINAL 8X10 ST (GAUZE/BANDAGES/DRESSINGS) ×4 IMPLANT
DURAPREP 26ML APPLICATOR (WOUND CARE) ×4 IMPLANT
ELECT REM PT RETURN 9FT ADLT (ELECTROSURGICAL) ×2
ELECTRODE REM PT RTRN 9FT ADLT (ELECTROSURGICAL) ×1 IMPLANT
GAUZE SPONGE 4X4 12PLY STRL (GAUZE/BANDAGES/DRESSINGS) ×2 IMPLANT
GLOVE BIO SURGEON STRL SZ7.5 (GLOVE) ×2 IMPLANT
GLOVE BIO SURGEON STRL SZ8 (GLOVE) ×2 IMPLANT
GLOVE EUDERMIC 7 POWDERFREE (GLOVE) ×2 IMPLANT
GLOVE ORTHO TXT STRL SZ7.5 (GLOVE) ×4 IMPLANT
GLOVE SS BIOGEL STRL SZ 7.5 (GLOVE) ×1 IMPLANT
GLOVE SUPERSENSE BIOGEL SZ 7.5 (GLOVE) ×1
GOWN STRL REUS W/ TWL LRG LVL3 (GOWN DISPOSABLE) ×1 IMPLANT
GOWN STRL REUS W/ TWL XL LVL3 (GOWN DISPOSABLE) ×2 IMPLANT
GOWN STRL REUS W/TWL LRG LVL3 (GOWN DISPOSABLE) ×1
GOWN STRL REUS W/TWL XL LVL3 (GOWN DISPOSABLE) ×2
KIT BASIN OR (CUSTOM PROCEDURE TRAY) ×2 IMPLANT
KIT ROOM TURNOVER OR (KITS) ×2 IMPLANT
KIT SHOULDER TRACTION (DRAPES) ×2 IMPLANT
MANIFOLD NEPTUNE II (INSTRUMENTS) ×2 IMPLANT
NDL SUT 6 .5 CRC .975X.05 MAYO (NEEDLE) IMPLANT
NEEDLE MAYO TAPER (NEEDLE)
NEEDLE SCORPION MULTI FIRE (NEEDLE) ×2 IMPLANT
NEEDLE SPNL 18GX3.5 QUINCKE PK (NEEDLE) ×2 IMPLANT
NS IRRIG 1000ML POUR BTL (IV SOLUTION) ×2 IMPLANT
PACK SHOULDER (CUSTOM PROCEDURE TRAY) ×2 IMPLANT
PAD ARMBOARD 7.5X6 YLW CONV (MISCELLANEOUS) ×4 IMPLANT
SET ARTHROSCOPY TUBING (MISCELLANEOUS) ×1
SET ARTHROSCOPY TUBING LN (MISCELLANEOUS) ×1 IMPLANT
SLING ARM FOAM STRAP LRG (SOFTGOODS) IMPLANT
SLING ARM IMMOBILIZER LRG (SOFTGOODS) ×2 IMPLANT
SLING ARM LRG ADULT FOAM STRAP (SOFTGOODS) IMPLANT
SLING ARM MED ADULT FOAM STRAP (SOFTGOODS) ×2 IMPLANT
SPONGE GAUZE 4X4 12PLY STER LF (GAUZE/BANDAGES/DRESSINGS) ×2 IMPLANT
SPONGE LAP 4X18 X RAY DECT (DISPOSABLE) IMPLANT
STRIP CLOSURE SKIN 1/2X4 (GAUZE/BANDAGES/DRESSINGS) ×2 IMPLANT
SUT MNCRL AB 3-0 PS2 18 (SUTURE) ×2 IMPLANT
SUT MNCRL AB 4-0 PS2 18 (SUTURE) IMPLANT
SUT PDS AB 0 CT 36 (SUTURE) IMPLANT
SUT TIGER TAPE 7 IN WHITE (SUTURE) IMPLANT
SYR 20CC LL (SYRINGE) IMPLANT
TAPE FIBER 2MM 7IN #2 BLUE (SUTURE) ×2 IMPLANT
TAPE PAPER 3X10 WHT MICROPORE (GAUZE/BANDAGES/DRESSINGS) ×2 IMPLANT
TOWEL OR 17X24 6PK STRL BLUE (TOWEL DISPOSABLE) ×2 IMPLANT
TOWEL OR 17X26 10 PK STRL BLUE (TOWEL DISPOSABLE) ×2 IMPLANT
WAND SUCTION MAX 4MM 90S (SURGICAL WAND) ×2 IMPLANT
WATER STERILE IRR 1000ML POUR (IV SOLUTION) ×2 IMPLANT

## 2016-10-26 NOTE — Transfer of Care (Signed)
Immediate Anesthesia Transfer of Care Note  Patient: SANCHEZ HEMMER  Procedure(s) Performed: Procedure(s) with comments: RIGHT SHOULDER ARTHROSCOPY WITH ROTATOR CUFF REPAIR AND SUBACROMIAL DECOMPRESSION (Right) - requests 2hrs SHOULDER ARTHROSCOPY WITH DISTAL CLAVICLE RESECTION (Right)  Patient Location: PACU  Anesthesia Type:General and Regional  Level of Consciousness: awake and alert   Airway & Oxygen Therapy: Patient Spontanous Breathing and Patient connected to nasal cannula oxygen  Post-op Assessment: Report given to RN and Post -op Vital signs reviewed and stable  Post vital signs: Reviewed and stable  Last Vitals:  Vitals:   10/26/16 1220 10/26/16 1225  BP:  (!) 144/77  Pulse: 84 85  Resp: 18 20  Temp:     HR 83, RR 17, Sats 100%, BP 142/94 Last Pain: There were no vitals filed for this visit.       Complications: No apparent anesthesia complications

## 2016-10-26 NOTE — Anesthesia Procedure Notes (Signed)
Anesthesia Regional Block: Interscalene brachial plexus block   Pre-Anesthetic Checklist: ,, timeout performed, Correct Patient, Correct Site, Correct Laterality, Correct Procedure, Correct Position, site marked, Risks and benefits discussed,  Surgical consent,  Pre-op evaluation,  At surgeon's request and post-op pain management  Laterality: Upper and Right  Prep: chloraprep       Needles:  Injection technique: Single-shot  Needle Type: Echogenic Stimulator Needle          Additional Needles:   Procedures: ultrasound guided,,,,,,,,  Narrative:  Start time: 10/26/2016 12:07 PM End time: 10/26/2016 12:13 PM Injection made incrementally with aspirations every 5 mL.  Performed by: Personally  Anesthesiologist: Arsal Tappan  Additional Notes: H+P and labs reviewed, risks and benefits discussed with patient, procedure tolerated well without complications

## 2016-10-26 NOTE — Progress Notes (Signed)
Orthopedic Tech Progress Note Patient Details:  [name] 1956-02-18 [id number]  Ortho Devices Type of Ortho Device: Shoulder abduction pillow Ortho Device/Splint Location: Provided Shoulder Adduction pillow as requested (Ortho Tech, Drop off) Ortho Device/Splint Interventions: Other (comment)   Kristopher Oppenheim 10/26/2016, 3:23 PM

## 2016-10-26 NOTE — Anesthesia Procedure Notes (Signed)
Procedure Name: Intubation Date/Time: 10/26/2016 12:39 PM Performed by: Merrilyn Puma B Pre-anesthesia Checklist: Patient identified, Emergency Drugs available, Suction available, Patient being monitored and Timeout performed Patient Re-evaluated:Patient Re-evaluated prior to inductionOxygen Delivery Method: Circle system utilized Preoxygenation: Pre-oxygenation with 100% oxygen Intubation Type: IV induction Ventilation: Mask ventilation without difficulty Laryngoscope Size: Mac and 4 Grade View: Grade III Tube type: Oral Tube size: 7.5 mm Number of attempts: 1 Airway Equipment and Method: Stylet Placement Confirmation: ETT inserted through vocal cords under direct vision,  positive ETCO2,  CO2 detector and breath sounds checked- equal and bilateral Secured at: 22 cm Tube secured with: Tape Dental Injury: Teeth and Oropharynx as per pre-operative assessment

## 2016-10-26 NOTE — Anesthesia Preprocedure Evaluation (Signed)
Anesthesia Evaluation  Patient identified by MRN, date of birth, ID band Patient awake    Reviewed: Allergy & Precautions, H&P , NPO status , Patient's Chart, lab work & pertinent test results  Airway Mallampati: II  TM Distance: >3 FB Neck ROM: Full    Dental no notable dental hx. (+) Teeth Intact, Dental Advisory Given   Pulmonary neg pulmonary ROS,    Pulmonary exam normal breath sounds clear to auscultation       Cardiovascular + Peripheral Vascular Disease   Rhythm:Regular Rate:Normal     Neuro/Psych PSYCHIATRIC DISORDERS Depression negative neurological ROS     GI/Hepatic negative GI ROS, Neg liver ROS,   Endo/Other  diabetes, Well Controlled, Type 2, Oral Hypoglycemic AgentsHypothyroidism   Renal/GU negative Renal ROS  negative genitourinary   Musculoskeletal  (+) Arthritis , Osteoarthritis,    Abdominal   Peds  Hematology negative hematology ROS (+)   Anesthesia Other Findings   Reproductive/Obstetrics negative OB ROS                             Anesthesia Physical Anesthesia Plan  ASA: II  Anesthesia Plan: General and Regional   Post-op Pain Management:  Regional for Post-op pain   Induction: Intravenous  Airway Management Planned: Oral ETT  Additional Equipment: None  Intra-op Plan:   Post-operative Plan: Extubation in OR  Informed Consent: I have reviewed the patients History and Physical, chart, labs and discussed the procedure including the risks, benefits and alternatives for the proposed anesthesia with the patient or authorized representative who has indicated his/her understanding and acceptance.   Dental advisory given  Plan Discussed with: CRNA and Surgeon  Anesthesia Plan Comments:         Anesthesia Quick Evaluation

## 2016-10-26 NOTE — Op Note (Signed)
10/26/2016  2:20 PM  PATIENT:   [name]  61 y.o. male  PRE-OPERATIVE DIAGNOSIS:  Right shoulder impingement, AC joint osteoarthritis, rotator cuff tear  POST-OPERATIVE DIAGNOSIS:  Same with labral tear  PROCEDURE:  RSA, labral debridement, SAD, DCR, RCR  SURGEON:  Tiari Andringa, Metta Clines M.D.  ASSISTANTS: [place]ord pac   ANESTHESIA:   GET + ISB  EBL: min  SPECIMEN:  none  Drains: none   PATIENT DISPOSITION:  PACU - hemodynamically stable.    PLAN OF CARE: Discharge to home after PACU  Dictation# [id number]   Contact # [phone]

## 2016-10-26 NOTE — H&P (Signed)
[name]    Chief Complaint: Right shoulder impingement, AC joint osteoarthritis, rotator cuff tear HPI: The patient is a 61 y.o. male with chronic right shoulder pain refractory to conservative management. MRI scan confirms full  Thickness rotator cuff tear  Past Medical History:  Diagnosis Date  . Alopecia    s/p chemotherapy  . Arthritis   . Bursitis   . 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 Diabetes mellitus without complication ([place])    takes Amaryl,Januvia,and Metformin,daily  . DVT, lower extremity ([place]) 2009   side effect from chemo and has blood clot left leg-wears compression stockings;was on Coumadin for 59month and has been off 5 yrs  . History of chemotherapy    Cisplatin/Taxotere,5-FU  . History of radiation therapy 01/28/08-03/11/08   left base tongue  . History of shingles   . Hyperlipidemia    takes Vytorin daily  . Hypothyroidism    takes Synthroid daily  . Joint pain   . Joint swelling   . [place]    left foot  . 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 PE (pulmonary embolism) 12/07/2014  . Pneumonia   . Seborrheic keratosis    multiple on back  . Shortness of breath dyspnea     Past Surgical History:  Procedure Laterality Date  . CARDIAC CATHETERIZATION    . feeding tube placed  2009 and then removed  . JOINT REPLACEMENT    . KNEE SURGERY Left 1993  . MOUTH SURGERY    . PILONIDAL CYST / SINUS EXCISION  1974  . port a cath placement    . PORT-A-CATH REMOVAL  2010  . right wrist fracture Right 2004  . SHOULDER ARTHROSCOPY WITH ROTATOR CUFF REPAIR AND SUBACROMIAL DECOMPRESSION Left 07/16/2014   Procedure: LEFT SHOULDER ARTHROSCOPY WITH ROTATOR CUFF REPAIR AND SUBACROMIAL DECOMPRESSION/DISTAL CLAVICLE RESECTION;  Surgeon: KMarin Shutter MD;  [place] [facility] [facility]  Service: Orthopedics;  Laterality: Left;  . teeth extraction #9      Family History  Problem Relation Age of Onset  . Cancer  Mother     uterine  . Diabetes Brother     Social History:  reports that he has never smoked. He has never used smokeless tobacco. He reports that he drinks alcohol. He reports that he does not use drugs.   Medications Prior to Admission  Medication Sig Dispense Refill  . enoxaparin (LOVENOX) 100 MG/ML injection Inject 100 mg into the skin daily.  0  . ezetimibe-simvastatin (VYTORIN) 10-20 MG per tablet Take 1 tablet by mouth at bedtime.      .Marland Kitchenglimepiride (AMARYL) 4 MG tablet Take 4 mg by mouth 2 (two) times daily.     . INVOKANA 300 MG TABS tablet Take 300 mg by mouth daily.  12  . levothyroxine (SYNTHROID, LEVOTHROID) 75 MCG tablet Take 75 mcg by mouth daily before breakfast.    . Liraglutide 18 MG/3ML SOPN Inject 1.2 mg into the skin at bedtime.     . metFORMIN (GLUCOPHAGE-XR) 500 MG 24 hr tablet Take 1,000 mg by mouth 2 (two) times daily.  4  . rivaroxaban (XARELTO) 20 MG TABS tablet Take 1 tablet (20 mg total) by mouth daily with supper. (Patient taking differently: Take 20 mg by mouth daily. ) 30 tablet 0     Physical Exam: right shoulder with painful and limited motion as noted at recent office visits  Vitals  Weight:  [93.4 kg (  206 lb)] 93.4 kg (206 lb) (02/22 1052)  Assessment/Plan  Impression: Right shoulder impingement, AC joint osteoarthritis, rotator cuff tear  Plan of Action: Procedure(s): RIGHT SHOULDER ARTHROSCOPY WITH ROTATOR CUFF REPAIR AND SUBACROMIAL DECOMPRESSION SHOULDER ARTHROSCOPY WITH DISTAL CLAVICLE RESECTION  Kaori Jumper M Milayna Rotenberg 10/26/2016, 12:08 PM Contact # [phone]

## 2016-10-26 NOTE — Discharge Instructions (Signed)
° °  Kevin M. Supple, M.D., F.A.A.O.S. °Orthopaedic Surgery °Specializing in Arthroscopic and Reconstructive °Surgery of the Shoulder and Knee °[phone] °[address] - [place], [place] [address] - Fax [phone] ° °POST-OP SHOULDER ARTHROSCOPIC ROTATOR CUFF AND/OR LABRAL REPAIR INSTRUCTIONS ° °1. Call the office at [phone] to schedule your first post-op appointment 7-10 days from the date of your surgery. ° °2. Leave the steri-strips in place over your incisions when performing dressing changes and showering. You may remove your dressings and begin showering 72 hours from surgery. You can expect drainage that is clear to bloody in nature that occasionally will soak through your dressings. If this occurs go ahead and perform a dressing change. The drainage should lessen daily and when there is no drainage from your incisions feel free to go without a dressing. ° °3. Wear your sling/immobilizer at all times except to perform the exercises below or to occasionally let your arm dangle by your side to stretch your elbow. You also need to sleep in your sling immobilizer until instructed otherwise. ° °4. Range of motion to your elbow, wrist, and hand are encouraged 3-5 times daily. Exercise to your hand and fingers helps to reduce swelling you may experience. ° °5. Utilize ice to the shoulder 3-4 times minimum a day and additionally if you are experiencing pain. ° °6. You may one-armed drive when safely off of narcotics and muscle relaxants. You may use your hand that is in the sling to support the steering wheel only. However, should it be your right arm that is in the sling it is not to be used for gear shifting in a manual transmission. ° °7. If you had a block pre-operatively to provide post-op pain relief you may want to go ahead and begin utilizing your pain meds as your arm begins to wake up. Blocks can sometimes last up to 16-18 hours. If you are still pain-free prior to going to bed you may  want to strongly consider taking a pain medication to avoid being awakened in the night with the onset of pain. A muscle relaxant is also provided for you should you experience muscle spasms. It is recommended that if you are experiencing pain that your pain medication alone is not controlling, add the muscle relaxant along with the pain medication which can give additional pain relief. The first one to two days is generally the most severe of your pain and then should gradually decrease. As your pain lessens it is recommended that you decrease your use of the pain medications to an "as needed basis" only and to always comply with the recommended dosages of the pain medications. ° °8. Pain medications can produce constipation along with their use. If you experience this, the use of an over the counter stool softener or laxative daily is recommended.  ° °9. For additional questions or concerns, please do not hesitate to call the office. If after hours there is an answering service to forward your concerns to the physician on call. ° °POST-OP EXERCISES ° °Pendulum Exercises ° °Perform pendulum exercises while standing and bending at the waist. Support your uninvolved arm on a table or chair and allow your operated arm to hang freely. Make sure to do these exercises passively - not using you shoulder muscle. ° °Repeat 20 times. Do 3 sessions per day. ° ° °

## 2016-10-26 NOTE — Op Note (Signed)
NAMEKATAI, MARSICO NO.:  [id number]  MEDICAL RECORD NO.:  [id number]  LOCATION:  [facility]                         FACILITY:  [facility]  PHYSICIAN:  Metta Clines. Sianna Garofano, M.D.  DATE OF BIRTH:  [birth year]  DATE OF PROCEDURE:  10/26/2016 DATE OF DISCHARGE:                              OPERATIVE REPORT   PREOPERATIVE DIAGNOSES: 1. Chronic right shoulder pain and impingement syndrome. 2. Right shoulder full-thickness rotator cuff tear. 3. Right shoulder acromioclavicular joint arthropathy.  POSTOPERATIVE DIAGNOSES: 1. Chronic right shoulder pain and impingement syndrome. 2. Right shoulder full-thickness rotator cuff tear. 3. Right shoulder acromioclavicular joint arthropathy. 4. Right shoulder labral tear.  PROCEDURES: 1. Right shoulder examination under anesthesia. 2. Right shoulder glenohumeral joint diagnostic arthroscopy. 3. Labral debridement. 4. Arthroscopic subacromial decompression and bursectomy. 5. Arthroscopic distal clavicle resection. 6. Arthroscopic rotator cuff repair using a double-row SutureBridge     repair construct.  SURGEON:  Metta Clines. Gayanne Prescott, M.D.  Terrence DupontOlivia Mackie A. Shuford, P.A.-C.  ANESTHESIA:  General endotracheal as well as an interscalene block.  ESTIMATED BLOOD LOSS:  Minimal.  DRAINS:  None.  HISTORY:  Mr. Sisney is a 61 year old gentleman who has had persistent and progressive increasing right shoulder pain with impingement syndrome and symptoms that have been refractory to prolonged attempts at conservative management.  His examination shows a severely positive impingement sign with an MRI scan confirming a full-thickness tear of the rotator cuff.  Due to his ongoing pain, functional limitations and failure to respond to conservative management, he was brought to the operating room at this time for planned right shoulder arthroscopy as described below.  Preoperatively, I counseled Mr. Dade regarding treatment options  and potential risks versus benefits thereof.  Possible surgical complications were reviewed including bleeding, infection, neurovascular injury, persistent pain, loss of motion, anesthetic complication, recurrence of rotator cuff tear and possible need for additional surgery.  He understands and accepts and agrees with our planned procedure.  PROCEDURE IN DETAIL:  After undergoing routine preop evaluation, the patient received prophylactic antibiotics.  An interscalene block was established in the holding area by the Anesthesia Department.  Placed supine on the operating table, underwent smooth induction of a general endotracheal anesthesia.  Turned to the left lateral decubitus position on beanbag and appropriately padded and protected.  Right shoulder examination under anesthesia revealed some mild restrictions, mobility, but no change in mobility with gentle manipulation.  He did have almost 170 degrees of abduction and elevation, 80 degrees of rotation.  Right arm then suspended at 70 degrees of abduction with 15 pounds of traction and the right shoulder girdle region was sterilely prepped and draped in standard fashion.  Time-out was called.  Posterior portal was established under the glenohumeral joint, interval was established under direct visualization.  Articular surfaces were in good condition. Capsular volume was modestly contracted with some diffuse synovitis. There was degenerative tearing of the superior labrum, which we debrided with shaver back to stable margin.  The biceps tendons were normal caliber with no proximal or distal instability.  The rotator cuff showed an obvious full-thickness defect of the distal insertion of the junction between supra and infraspinatus.  No additional  pathologies were identified.  Fluid and instruments were removed.  The arm was dropped down to 30 degrees of abduction.  Arthroscope introduced into the subacromial space of the posterior  portal and a direct lateral portal established in the subacromial space.  Abundant dense bursal tissue and multiple adhesions were encountered.  These were divided and excised from the shaver and Stryker wand.  The wand was then used to remove the periosteum from the undersurface of the anterior half of the acromion and then a subacromial decompression was performed with a bur creating a type 1 morphology.  Portals were established directly anterior to the distal clavicle and the distal clavicle resection was performed with a bur.  Care was taken to confirm visualization of the entire circumference of the distal clavicle to ensure adequate removal of bone. We then completed the subacromial/subdeltoid bursectomy.  The rotator cuff tear was readily identified.  We debrided the rotator cuff tear of the margin back to healthy tissue with a shaver.  I prepared the tuberosity, removing soft tissue abrading the bone to bleeding bed. Ultimately, the footprint was approximately 2.5 cm in width.  Through a stab wound at the lateral margin of the acromion, placed an Arthrex PEEK SwiveLock suture anchor loaded with FiberTape and the four suture limbs were then shuttled equidistant across the width of the rotator cuff tear using a Scorpion suture passer and then placed two lateral anchors creating a double-row repair, which nicely compressed the margin of the rotator cuff tendon over the footprint of the tuberosity and overall construct was much to our satisfaction.  Suture limbs were all then clipped.  At this time, bursectomy was completed.  Hemostasis was obtained.  Fluid and instruments were removed.  The portals were closed with Monocryl and Steri-Strips.  A dry dressing taped about the right shoulder.  Right arm was placed into a sling with abduction pillow.  The patient was then awakened, extubated and taken to the recovery room in stable condition.  Tracy A. Shuford, PA-C was used as an  Environmental consultant throughout this case, essential for help with positioning the patient, positioning the extremity, management of the arthroscopic equipment, tissue manipulation, suture management, wound closure, and intraoperative decision making.     Metta Clines. Zema Lizardo, M.D.     KMS/MEDQ  D:  10/26/2016  T:  10/26/2016  Job:  [id number]

## 2016-10-28 NOTE — Anesthesia Postprocedure Evaluation (Addendum)
Anesthesia Post Note  Patient: [name]  Procedure(s) Performed: Procedure(s) (LRB): RIGHT SHOULDER ARTHROSCOPY WITH ROTATOR CUFF REPAIR AND SUBACROMIAL DECOMPRESSION (Right) SHOULDER ARTHROSCOPY WITH DISTAL CLAVICLE RESECTION (Right)  Patient location during evaluation: PACU Anesthesia Type: Regional Level of consciousness: awake and alert Pain management: pain level controlled Vital Signs Assessment: post-procedure vital signs reviewed and stable Respiratory status: spontaneous breathing, nonlabored ventilation, respiratory function stable and patient connected to nasal cannula oxygen Cardiovascular status: blood pressure returned to baseline and stable Postop Assessment: no signs of nausea or vomiting Anesthetic complications: no       Last Vitals:  Vitals:   10/26/16 1502 10/26/16 1513  BP: (!) 148/83 (!) 156/93  Pulse: 82 84  Resp: 14 16  Temp:  36.1 C    Last Pain:  Vitals:   10/26/16 1513  PainSc: 0-No pain                 Khrista Braun

## 2016-10-30 ENCOUNTER — Encounter (HOSPITAL_COMMUNITY): Payer: Self-pay | Admitting: Orthopedic Surgery

## 2016-11-03 MED FILL — LEVOTHYROXINE 75 MCG TABLET: 75 | 90 days supply | Qty: 90 | Fill #2

## 2016-11-03 MED FILL — ACCU-CHEK GUIDE TEST STRIP: 50 days supply | Qty: 100 | Fill #1

## 2016-11-03 MED FILL — ACCU-CHEK FASTCLIX LANCETS: 90 days supply | Qty: 102 | Fill #0

## 2016-11-10 DIAGNOSIS — Z9861 Coronary angioplasty status: Secondary | ICD-10-CM | POA: Diagnosis not present

## 2016-11-10 DIAGNOSIS — Z859 Personal history of malignant neoplasm, unspecified: Secondary | ICD-10-CM | POA: Diagnosis not present

## 2016-11-10 DIAGNOSIS — I890 Lymphedema, not elsewhere classified: Secondary | ICD-10-CM | POA: Diagnosis not present

## 2016-11-10 DIAGNOSIS — M7541 Impingement syndrome of right shoulder: Secondary | ICD-10-CM | POA: Diagnosis not present

## 2016-11-10 DIAGNOSIS — M19011 Primary osteoarthritis, right shoulder: Secondary | ICD-10-CM | POA: Diagnosis not present

## 2016-11-10 DIAGNOSIS — M75121 Complete rotator cuff tear or rupture of right shoulder, not specified as traumatic: Secondary | ICD-10-CM | POA: Diagnosis not present

## 2016-11-10 DIAGNOSIS — Z7901 Long term (current) use of anticoagulants: Secondary | ICD-10-CM | POA: Diagnosis not present

## 2016-11-20 MED FILL — XARELTO 20 MG TABLET: 20 | 90 days supply | Qty: 90 | Fill #1

## 2016-11-20 MED FILL — INVOKANA 300 MG TABLET: 300 | 30 days supply | Qty: 30 | Fill #7

## 2016-11-20 MED FILL — GLIMEPIRIDE 4 MG TABLET: 4 | 90 days supply | Qty: 180 | Fill #3

## 2016-11-30 MED FILL — VICTOZA 18 MG/3 ML INJECT P: 18 | 45 days supply | Qty: 9 | Fill #4

## 2016-12-15 DIAGNOSIS — M25611 Stiffness of right shoulder, not elsewhere classified: Secondary | ICD-10-CM | POA: Diagnosis not present

## 2016-12-18 MED FILL — INVOKANA 300 MG TABLET: 300 | 30 days supply | Qty: 30 | Fill #8

## 2016-12-18 MED FILL — METFORMIN HCL ER 500 MG TAB: 500 | 90 days supply | Qty: 360 | Fill #3

## 2016-12-18 MED FILL — EZETIMIBE-SIMVASTATIN 10-20: 10-20 | 90 days supply | Qty: 90 | Fill #3

## 2016-12-19 DIAGNOSIS — M25611 Stiffness of right shoulder, not elsewhere classified: Secondary | ICD-10-CM | POA: Diagnosis not present

## 2016-12-21 DIAGNOSIS — I890 Lymphedema, not elsewhere classified: Secondary | ICD-10-CM | POA: Diagnosis not present

## 2016-12-21 DIAGNOSIS — M25611 Stiffness of right shoulder, not elsewhere classified: Secondary | ICD-10-CM | POA: Diagnosis not present

## 2016-12-26 DIAGNOSIS — M25611 Stiffness of right shoulder, not elsewhere classified: Secondary | ICD-10-CM | POA: Diagnosis not present

## 2016-12-28 DIAGNOSIS — M25611 Stiffness of right shoulder, not elsewhere classified: Secondary | ICD-10-CM | POA: Diagnosis not present

## 2017-01-03 DIAGNOSIS — M25611 Stiffness of right shoulder, not elsewhere classified: Secondary | ICD-10-CM | POA: Diagnosis not present

## 2017-01-03 DIAGNOSIS — I890 Lymphedema, not elsewhere classified: Secondary | ICD-10-CM | POA: Diagnosis not present

## 2017-01-05 DIAGNOSIS — M25611 Stiffness of right shoulder, not elsewhere classified: Secondary | ICD-10-CM | POA: Diagnosis not present

## 2017-01-05 DIAGNOSIS — I890 Lymphedema, not elsewhere classified: Secondary | ICD-10-CM | POA: Diagnosis not present

## 2017-01-08 DIAGNOSIS — M25611 Stiffness of right shoulder, not elsewhere classified: Secondary | ICD-10-CM | POA: Diagnosis not present

## 2017-01-11 DIAGNOSIS — M25611 Stiffness of right shoulder, not elsewhere classified: Secondary | ICD-10-CM | POA: Diagnosis not present

## 2017-01-15 DIAGNOSIS — M25611 Stiffness of right shoulder, not elsewhere classified: Secondary | ICD-10-CM | POA: Diagnosis not present

## 2017-01-17 MED FILL — INVOKANA 300 MG TABLET: 300 | 30 days supply | Qty: 30 | Fill #9

## 2017-01-17 MED FILL — UNIFINE PENTIPS 8MM 31G: 31G X 8 MM | 90 days supply | Qty: 100 | Fill #1

## 2017-01-17 MED FILL — VICTOZA 18 MG/3 ML INJECT P: 18 | 45 days supply | Qty: 9 | Fill #0

## 2017-01-22 DIAGNOSIS — E119 Type 2 diabetes mellitus without complications: Secondary | ICD-10-CM | POA: Diagnosis not present

## 2017-01-24 DIAGNOSIS — M25611 Stiffness of right shoulder, not elsewhere classified: Secondary | ICD-10-CM | POA: Diagnosis not present

## 2017-01-26 DIAGNOSIS — M25611 Stiffness of right shoulder, not elsewhere classified: Secondary | ICD-10-CM | POA: Diagnosis not present

## 2017-01-26 DIAGNOSIS — I890 Lymphedema, not elsewhere classified: Secondary | ICD-10-CM | POA: Diagnosis not present

## 2017-01-31 MED FILL — LEVOTHYROXINE 75 MCG TABLET: 75 | 90 days supply | Qty: 90 | Fill #0

## 2017-02-02 DIAGNOSIS — M25611 Stiffness of right shoulder, not elsewhere classified: Secondary | ICD-10-CM | POA: Diagnosis not present

## 2017-02-05 DIAGNOSIS — M25611 Stiffness of right shoulder, not elsewhere classified: Secondary | ICD-10-CM | POA: Diagnosis not present

## 2017-02-05 NOTE — Addendum Note (Signed)
Addendum  created 02/05/17 1127 by Oleta Mouse, MD   Sign clinical note

## 2017-02-08 DIAGNOSIS — M25611 Stiffness of right shoulder, not elsewhere classified: Secondary | ICD-10-CM | POA: Diagnosis not present

## 2017-02-12 DIAGNOSIS — Z86711 Personal history of pulmonary embolism: Secondary | ICD-10-CM | POA: Diagnosis not present

## 2017-02-12 DIAGNOSIS — Z7984 Long term (current) use of oral hypoglycemic drugs: Secondary | ICD-10-CM | POA: Diagnosis not present

## 2017-02-12 DIAGNOSIS — M25611 Stiffness of right shoulder, not elsewhere classified: Secondary | ICD-10-CM | POA: Diagnosis not present

## 2017-02-12 DIAGNOSIS — L570 Actinic keratosis: Secondary | ICD-10-CM | POA: Diagnosis not present

## 2017-02-12 DIAGNOSIS — Z0000 Encounter for general adult medical examination without abnormal findings: Secondary | ICD-10-CM | POA: Diagnosis not present

## 2017-02-12 DIAGNOSIS — E032 Hypothyroidism due to medicaments and other exogenous substances: Secondary | ICD-10-CM | POA: Diagnosis not present

## 2017-02-12 DIAGNOSIS — E119 Type 2 diabetes mellitus without complications: Secondary | ICD-10-CM | POA: Diagnosis not present

## 2017-02-12 DIAGNOSIS — Z1159 Encounter for screening for other viral diseases: Secondary | ICD-10-CM | POA: Diagnosis not present

## 2017-02-12 DIAGNOSIS — E7800 Pure hypercholesterolemia, unspecified: Secondary | ICD-10-CM | POA: Diagnosis not present

## 2017-02-12 MED FILL — INVOKANA 300 MG TABLET: 300 | 90 days supply | Qty: 90 | Fill #0

## 2017-02-12 MED FILL — GLIMEPIRIDE 4 MG TABLET: 4 | 90 days supply | Qty: 180 | Fill #0

## 2017-02-12 MED FILL — XARELTO 20 MG TABLET: 20 | 90 days supply | Qty: 90 | Fill #0

## 2017-02-15 DIAGNOSIS — M25611 Stiffness of right shoulder, not elsewhere classified: Secondary | ICD-10-CM | POA: Diagnosis not present

## 2017-02-19 DIAGNOSIS — M25611 Stiffness of right shoulder, not elsewhere classified: Secondary | ICD-10-CM | POA: Diagnosis not present

## 2017-02-21 ENCOUNTER — Other Ambulatory Visit: Payer: Self-pay | Admitting: Dermatology

## 2017-02-21 DIAGNOSIS — D0439 Carcinoma in situ of skin of other parts of face: Secondary | ICD-10-CM | POA: Diagnosis not present

## 2017-02-21 DIAGNOSIS — L821 Other seborrheic keratosis: Secondary | ICD-10-CM | POA: Diagnosis not present

## 2017-02-21 DIAGNOSIS — L570 Actinic keratosis: Secondary | ICD-10-CM | POA: Diagnosis not present

## 2017-02-21 DIAGNOSIS — D229 Melanocytic nevi, unspecified: Secondary | ICD-10-CM | POA: Diagnosis not present

## 2017-02-21 DIAGNOSIS — D492 Neoplasm of unspecified behavior of bone, soft tissue, and skin: Secondary | ICD-10-CM | POA: Diagnosis not present

## 2017-03-02 DIAGNOSIS — Z4789 Encounter for other orthopedic aftercare: Secondary | ICD-10-CM | POA: Diagnosis not present

## 2017-03-05 MED FILL — VICTOZA 18 MG/3 ML INJECT P: 18 | 45 days supply | Qty: 9 | Fill #0

## 2017-03-19 MED FILL — EZETIMIBE-SIMVASTATIN 10-20: 10-20 | 90 days supply | Qty: 90 | Fill #0

## 2017-03-19 MED FILL — METFORMIN HCL ER 500 MG TAB: 500 | 90 days supply | Qty: 360 | Fill #0

## 2017-03-22 DIAGNOSIS — D0439 Carcinoma in situ of skin of other parts of face: Secondary | ICD-10-CM | POA: Diagnosis not present

## 2017-04-30 MED FILL — LEVOTHYROXINE 75 MCG TABLET: 75 | 90 days supply | Qty: 90 | Fill #0

## 2017-04-30 MED FILL — UNIFINE PENTIPS 8MM 31G: 31G X 8 MM | 90 days supply | Qty: 100 | Fill #2

## 2017-04-30 MED FILL — VICTOZA 18 MG/3 ML INJECT P: 18 | 45 days supply | Qty: 9 | Fill #0

## 2017-05-14 MED FILL — XARELTO 20 MG TABLET: 20 | 90 days supply | Qty: 90 | Fill #1

## 2017-05-14 MED FILL — INVOKANA 300 MG TABLET: 300 | 90 days supply | Qty: 90 | Fill #1

## 2017-05-20 MED FILL — GLIMEPIRIDE 4 MG TABLET: 4 | 90 days supply | Qty: 180 | Fill #1

## 2017-06-17 MED FILL — VICTOZA 18 MG/3 ML INJECT P: 18 | 45 days supply | Qty: 9 | Fill #1

## 2017-06-18 MED FILL — METFORMIN HCL ER 500 MG TAB: 500 | 90 days supply | Qty: 360 | Fill #1

## 2017-06-25 MED FILL — EZETIMIBE-SIMVASTATIN 10-20: 10-20 | 90 days supply | Qty: 90 | Fill #1

## 2017-08-01 MED FILL — LEVOTHYROXINE 75 MCG TABLET: 75 | 90 days supply | Qty: 90 | Fill #1

## 2017-08-01 MED FILL — VICTOZA 18 MG/3 ML INJECT P: 18 | 45 days supply | Qty: 9 | Fill #2

## 2017-08-05 MED FILL — XARELTO 20 MG TABLET: 20 | 90 days supply | Qty: 90 | Fill #2

## 2017-08-05 MED FILL — INVOKANA 300 MG TABLET: 300 | 90 days supply | Qty: 90 | Fill #2

## 2017-08-14 ENCOUNTER — Other Ambulatory Visit: Payer: Self-pay

## 2017-08-14 NOTE — Patient Outreach (Signed)
[facility]) Care Management  08/14/2017  [name] Jul 14, 1956 [id number]   Case closed in [place].  Member is being followed by the [employer] program Member enrolled in an external program. Peter Garter RN, [facility] Care Management Coordinator-Link to [place] Management [phone]

## 2017-08-17 DIAGNOSIS — Z794 Long term (current) use of insulin: Secondary | ICD-10-CM | POA: Diagnosis not present

## 2017-08-17 DIAGNOSIS — E119 Type 2 diabetes mellitus without complications: Secondary | ICD-10-CM | POA: Diagnosis not present

## 2017-08-17 DIAGNOSIS — Z23 Encounter for immunization: Secondary | ICD-10-CM | POA: Diagnosis not present

## 2017-08-17 DIAGNOSIS — Z86711 Personal history of pulmonary embolism: Secondary | ICD-10-CM | POA: Diagnosis not present

## 2017-08-17 MED FILL — XARELTO 10 MG TABLET: 10 | 90 days supply | Qty: 90 | Fill #0

## 2017-08-19 MED FILL — GLIMEPIRIDE 4 MG TABLET: 4 | 90 days supply | Qty: 180 | Fill #2

## 2017-08-30 MED FILL — UNIFINE PENTIPS 8MM 31G: 31G X 8 MM | 90 days supply | Qty: 100 | Fill #3

## 2017-09-15 MED FILL — METFORMIN HCL ER 500 MG TAB: 500 | 90 days supply | Qty: 360 | Fill #2

## 2017-09-15 MED FILL — EZETIMIBE-SIMVASTATIN 10-20: 10-20 | 90 days supply | Qty: 90 | Fill #2

## 2017-10-08 MED FILL — TRULICITY 1.5 MG/0.5 ML PEN: 1.5 | 28 days supply | Qty: 2 | Fill #0

## 2017-10-17 DIAGNOSIS — E119 Type 2 diabetes mellitus without complications: Secondary | ICD-10-CM | POA: Diagnosis not present

## 2017-10-17 DIAGNOSIS — Z794 Long term (current) use of insulin: Secondary | ICD-10-CM | POA: Diagnosis not present

## 2017-10-24 MED FILL — JARDIANCE 25 MG TABLET: 25 | 90 days supply | Qty: 90 | Fill #0

## 2017-10-31 MED FILL — TRULICITY 1.5 MG/0.5 ML PEN: 1.5 | 28 days supply | Qty: 2 | Fill #1

## 2017-10-31 MED FILL — LEVOTHYROXINE 75 MCG TABLET: 75 | 90 days supply | Qty: 90 | Fill #2

## 2017-11-13 DIAGNOSIS — Z23 Encounter for immunization: Secondary | ICD-10-CM | POA: Diagnosis not present

## 2017-11-18 MED FILL — GLIMEPIRIDE 4 MG TABLET: 4 | 90 days supply | Qty: 180 | Fill #3

## 2017-11-18 MED FILL — XARELTO 10 MG TABLET: 10 | 90 days supply | Qty: 90 | Fill #1

## 2017-11-28 MED FILL — TRULICITY 1.5 MG/0.5 ML PEN: 1.5 | 28 days supply | Qty: 2 | Fill #2

## 2017-12-24 MED FILL — EZETIMIBE-SIMVASTATIN 10-20: 10-20 | 90 days supply | Qty: 90 | Fill #3

## 2017-12-24 MED FILL — METFORMIN HCL ER 500 MG TAB: 500 | 90 days supply | Qty: 360 | Fill #3

## 2017-12-31 MED FILL — TRULICITY 1.5 MG/0.5 ML PEN: 1.5 | 28 days supply | Qty: 2 | Fill #3

## 2018-01-02 DIAGNOSIS — I803 Phlebitis and thrombophlebitis of lower extremities, unspecified: Secondary | ICD-10-CM | POA: Diagnosis not present

## 2018-01-02 MED FILL — DOXYCYCLINE HYCLATE 100 MG: 100 | 10 days supply | Qty: 20 | Fill #0

## 2018-01-11 MED FILL — DOXYCYCLINE HYCLATE 100 MG: 100 | 10 days supply | Qty: 20 | Fill #1

## 2018-01-27 MED FILL — LEVOTHYROXINE 75 MCG TABLET: 75 | 90 days supply | Qty: 90 | Fill #3

## 2018-01-29 MED FILL — JARDIANCE 25 MG TABLET: 25 | 90 days supply | Qty: 90 | Fill #1

## 2018-01-29 MED FILL — TRULICITY 1.5 MG/0.5 ML PEN: 1.5 | 84 days supply | Qty: 6 | Fill #0

## 2018-02-25 MED FILL — GLIMEPIRIDE 4 MG TABLET: 4 | 45 days supply | Qty: 90 | Fill #0

## 2018-02-25 MED FILL — XARELTO 10 MG TABLET: 10 | 90 days supply | Qty: 90 | Fill #2

## 2018-02-26 DIAGNOSIS — D229 Melanocytic nevi, unspecified: Secondary | ICD-10-CM | POA: Diagnosis not present

## 2018-02-26 DIAGNOSIS — I872 Venous insufficiency (chronic) (peripheral): Secondary | ICD-10-CM | POA: Diagnosis not present

## 2018-02-26 DIAGNOSIS — L570 Actinic keratosis: Secondary | ICD-10-CM | POA: Diagnosis not present

## 2018-02-26 MED FILL — HALOBETASOL PROP 0.05% CRM: 0.05 | 20 days supply | Qty: 50 | Fill #0

## 2018-03-25 MED FILL — METFORMIN HCL ER 500 MG TAB: 500 | 90 days supply | Qty: 360 | Fill #0

## 2018-03-27 MED FILL — EZETIMIBE-SIMVASTATIN 10-20: 10-20 | 90 days supply | Qty: 90 | Fill #0

## 2018-04-15 MED FILL — GLIMEPIRIDE 4 MG TABLET: 4 | 45 days supply | Qty: 90 | Fill #0

## 2018-04-19 DIAGNOSIS — E119 Type 2 diabetes mellitus without complications: Secondary | ICD-10-CM | POA: Diagnosis not present

## 2018-04-19 DIAGNOSIS — Z0000 Encounter for general adult medical examination without abnormal findings: Secondary | ICD-10-CM | POA: Diagnosis not present

## 2018-04-19 DIAGNOSIS — E032 Hypothyroidism due to medicaments and other exogenous substances: Secondary | ICD-10-CM | POA: Diagnosis not present

## 2018-04-19 DIAGNOSIS — E7800 Pure hypercholesterolemia, unspecified: Secondary | ICD-10-CM | POA: Diagnosis not present

## 2018-04-19 DIAGNOSIS — Z125 Encounter for screening for malignant neoplasm of prostate: Secondary | ICD-10-CM | POA: Diagnosis not present

## 2018-04-23 DIAGNOSIS — D689 Coagulation defect, unspecified: Secondary | ICD-10-CM | POA: Diagnosis not present

## 2018-04-23 DIAGNOSIS — E7800 Pure hypercholesterolemia, unspecified: Secondary | ICD-10-CM | POA: Diagnosis not present

## 2018-04-23 DIAGNOSIS — Z0000 Encounter for general adult medical examination without abnormal findings: Secondary | ICD-10-CM | POA: Diagnosis not present

## 2018-04-23 DIAGNOSIS — E1169 Type 2 diabetes mellitus with other specified complication: Secondary | ICD-10-CM | POA: Diagnosis not present

## 2018-04-23 DIAGNOSIS — Z86711 Personal history of pulmonary embolism: Secondary | ICD-10-CM | POA: Diagnosis not present

## 2018-04-23 DIAGNOSIS — E032 Hypothyroidism due to medicaments and other exogenous substances: Secondary | ICD-10-CM | POA: Diagnosis not present

## 2018-04-23 MED FILL — LEVOTHYROXINE 75 MCG TABLET: 75 | 90 days supply | Qty: 90 | Fill #0

## 2018-04-23 MED FILL — ACCU-CHEK GUIDE TEST STRIP: 90 days supply | Qty: 300 | Fill #0

## 2018-04-23 MED FILL — ACCU-CHEK FASTCLIX LANCETS: 90 days supply | Qty: 306 | Fill #0

## 2018-04-23 MED FILL — TRULICITY 1.5 MG/0.5 ML PEN: 1.5 | 28 days supply | Qty: 2 | Fill #0

## 2018-04-23 MED FILL — JARDIANCE 25 MG TABLET: 25 | 90 days supply | Qty: 90 | Fill #0

## 2018-05-20 MED FILL — LANTUS SOLOSTAR 100 UNITS/M: 100 | 75 days supply | Qty: 9 | Fill #0

## 2018-05-21 DIAGNOSIS — R1314 Dysphagia, pharyngoesophageal phase: Secondary | ICD-10-CM | POA: Diagnosis not present

## 2018-05-26 MED FILL — TRULICITY 1.5 MG/0.5 ML PEN: 1.5 | 28 days supply | Qty: 2 | Fill #1

## 2018-05-27 MED FILL — XARELTO 10 MG TABLET: 10 | 90 days supply | Qty: 90 | Fill #3

## 2018-06-24 MED FILL — TRULICITY 1.5 MG/0.5 ML PEN: 1.5 | 28 days supply | Qty: 2 | Fill #2

## 2018-06-24 MED FILL — metFORMIN HCL ER 500 MG TB2: 500 | 90 days supply | Qty: 360 | Fill #0

## 2018-07-10 MED FILL — EZETIMIBE-SIMVASTATIN 10-20: 10-20 | 90 days supply | Qty: 90 | Fill #0

## 2018-07-22 MED FILL — TRULICITY 1.5 MG/0.5 ML PEN: 1.5 | 28 days supply | Qty: 2 | Fill #3

## 2018-07-23 MED FILL — LANTUS SOLOSTAR 100 UNITS/M: 100 | 75 days supply | Qty: 9 | Fill #1

## 2018-07-28 MED FILL — JARDIANCE 25 MG TABLET: 25 | 90 days supply | Qty: 90 | Fill #1

## 2018-08-05 MED FILL — LEVOTHYROXINE 75 MCG TABLET: 75 | 90 days supply | Qty: 90 | Fill #1

## 2018-08-09 DIAGNOSIS — E7800 Pure hypercholesterolemia, unspecified: Secondary | ICD-10-CM | POA: Diagnosis not present

## 2018-08-09 DIAGNOSIS — E1169 Type 2 diabetes mellitus with other specified complication: Secondary | ICD-10-CM | POA: Diagnosis not present

## 2018-08-09 DIAGNOSIS — E032 Hypothyroidism due to medicaments and other exogenous substances: Secondary | ICD-10-CM | POA: Diagnosis not present

## 2018-08-21 MED FILL — TRULICITY 1.5 MG/0.5 ML PEN: 1.5 | 28 days supply | Qty: 2 | Fill #4

## 2018-08-26 MED FILL — XARELTO 10 MG TABLET: 10 | 90 days supply | Qty: 90 | Fill #0

## 2018-09-12 MED FILL — NOVOTWIST 32G X 5 MM MISC: 32G X 5 MM | 90 days supply | Qty: 100 | Fill #0

## 2018-09-17 MED FILL — TRULICITY 1.5 MG/0.5 ML PEN: 1.5 | 28 days supply | Qty: 2 | Fill #0

## 2018-09-30 MED FILL — EZETIMIBE-SIMVASTATIN 10-20: 10-20 | 90 days supply | Qty: 90 | Fill #1

## 2018-09-30 MED FILL — metFORMIN HCL ER 500 MG TB2: 500 | 90 days supply | Qty: 360 | Fill #1

## 2018-09-30 MED FILL — LANTUS SOLOSTAR 100 UNITS/M: 100 | 75 days supply | Qty: 9 | Fill #0

## 2018-10-18 DIAGNOSIS — Z7984 Long term (current) use of oral hypoglycemic drugs: Secondary | ICD-10-CM | POA: Diagnosis not present

## 2018-10-18 DIAGNOSIS — D3132 Benign neoplasm of left choroid: Secondary | ICD-10-CM | POA: Diagnosis not present

## 2018-10-18 DIAGNOSIS — E119 Type 2 diabetes mellitus without complications: Secondary | ICD-10-CM | POA: Diagnosis not present

## 2018-10-18 DIAGNOSIS — Z794 Long term (current) use of insulin: Secondary | ICD-10-CM | POA: Diagnosis not present

## 2018-10-18 DIAGNOSIS — H2513 Age-related nuclear cataract, bilateral: Secondary | ICD-10-CM | POA: Diagnosis not present

## 2018-10-18 DIAGNOSIS — H5203 Hypermetropia, bilateral: Secondary | ICD-10-CM | POA: Diagnosis not present

## 2018-10-24 MED FILL — TRULICITY 1.5 MG/0.5 ML PEN: 1.5 | 28 days supply | Qty: 2 | Fill #1

## 2018-10-28 MED FILL — JARDIANCE 25 MG TABLET: 25 | 90 days supply | Qty: 90 | Fill #2

## 2018-11-04 MED FILL — LEVOTHYROXINE 75 MCG TABLET: 75 | 90 days supply | Qty: 90 | Fill #2

## 2018-11-15 DIAGNOSIS — Z794 Long term (current) use of insulin: Secondary | ICD-10-CM | POA: Diagnosis not present

## 2018-11-15 DIAGNOSIS — Z23 Encounter for immunization: Secondary | ICD-10-CM | POA: Diagnosis not present

## 2018-11-15 DIAGNOSIS — E1169 Type 2 diabetes mellitus with other specified complication: Secondary | ICD-10-CM | POA: Diagnosis not present

## 2018-11-18 MED FILL — TRULICITY 1.5 MG/0.5 ML PEN: 1.5 | 28 days supply | Qty: 2 | Fill #2

## 2018-11-24 MED FILL — XARELTO 10 MG TABLET: 10 | 90 days supply | Qty: 90 | Fill #1

## 2018-11-30 MED FILL — LANTUS SOLOSTAR 100 UNITS/M: 100 | 75 days supply | Qty: 9 | Fill #1

## 2018-12-16 MED FILL — TRULICITY 1.5 MG/0.5 ML PEN: 1.5 | 28 days supply | Qty: 2 | Fill #3

## 2018-12-30 MED FILL — metFORMIN HCL ER 500 MG TB2: 500 | 90 days supply | Qty: 360 | Fill #2

## 2019-01-13 MED FILL — EZETIMIBE-SIMVASTATIN 10-20: 10-20 | 90 days supply | Qty: 90 | Fill #2

## 2019-01-13 MED FILL — NOVOTWIST 32G X 5 MM MISC: 32G X 5 MM | 90 days supply | Qty: 100 | Fill #1

## 2019-01-13 MED FILL — TRULICITY 1.5 MG/0.5 ML PEN: 1.5 | 28 days supply | Qty: 2 | Fill #0

## 2019-01-28 MED FILL — JARDIANCE 25 MG TABLET: 25 | 90 days supply | Qty: 90 | Fill #3

## 2019-01-30 MED FILL — LANTUS SOLOSTAR 100 UNITS/M: 100 | 75 days supply | Qty: 9 | Fill #2

## 2019-02-03 MED FILL — LEVOTHYROXINE 75 MCG TABLET: 75 | 90 days supply | Qty: 90 | Fill #3

## 2019-02-10 MED FILL — TRULICITY 1.5 MG/0.5 ML PEN: 1.5 | 28 days supply | Qty: 2 | Fill #1

## 2019-02-24 MED FILL — XARELTO 10 MG TABLET: 10 | 90 days supply | Qty: 90 | Fill #2

## 2019-03-10 MED FILL — TRULICITY 1.5 MG/0.5 ML PEN: 1.5 | 28 days supply | Qty: 2 | Fill #2

## 2019-03-26 MED FILL — LANTUS SOLOSTAR 100 UNITS/M: 100 | 83 days supply | Qty: 15 | Fill #0

## 2019-04-07 MED FILL — METFORMIN HCL ER 500 MG TB2: 500 | 30 days supply | Qty: 120 | Fill #3

## 2019-04-07 MED FILL — TRULICITY 1.5 MG/0.5 ML PEN: 1.5 | 28 days supply | Qty: 2 | Fill #3

## 2019-04-21 MED FILL — EZETIMIBE-SIMVASTATIN 10-20: 10-20 | 90 days supply | Qty: 90 | Fill #3

## 2019-04-28 MED FILL — LEVOTHYROXINE 75 MCG TABLET: 75 | 90 days supply | Qty: 90 | Fill #0

## 2019-04-28 MED FILL — JARDIANCE 25 MG TABLET: 25 | 90 days supply | Qty: 90 | Fill #0

## 2019-05-05 MED FILL — metFORMIN HCL ER 500 MG TB2: 500 | 30 days supply | Qty: 120 | Fill #0

## 2019-05-05 MED FILL — TRULICITY 1.5 MG/0.5 ML PEN: 1.5 | 28 days supply | Qty: 2 | Fill #4

## 2019-05-19 MED FILL — NOVOTWIST 32G X 5 MM MISC: 32G X 5 MM | 90 days supply | Qty: 100 | Fill #2

## 2019-05-26 MED FILL — XARELTO 10 MG TABLET: 10 | 90 days supply | Qty: 90 | Fill #0

## 2019-06-02 MED FILL — TRULICITY 1.5 MG/0.5 ML PEN: 1.5 | 28 days supply | Qty: 2 | Fill #5

## 2019-06-09 MED FILL — METFORMIN HCL ER 500 MG TB2: 500 | 30 days supply | Qty: 120 | Fill #1

## 2019-06-30 MED FILL — TRULICITY 1.5 MG/0.5 ML PEN: 1.5 | 28 days supply | Qty: 2 | Fill #0

## 2019-07-07 MED FILL — metFORMIN HCL ER 500 MG TB2: 500 | 30 days supply | Qty: 120 | Fill #2

## 2019-07-15 MED FILL — LANTUS SOLOSTAR 100 UNITS/M: 100 | 83 days supply | Qty: 15 | Fill #1

## 2019-07-28 MED FILL — LEVOTHYROXINE 75 MCG TABLET: 75 | 90 days supply | Qty: 90 | Fill #0

## 2019-07-28 MED FILL — TRULICITY 1.5 MG/0.5 ML PEN: 1.5 | 28 days supply | Qty: 2 | Fill #1

## 2019-07-28 MED FILL — JARDIANCE 25 MG TABLET: 25 | 90 days supply | Qty: 90 | Fill #1

## 2019-07-28 MED FILL — EZETIMIBE-SIMVASTATIN 10-20: 10-20 | 90 days supply | Qty: 90 | Fill #0

## 2019-08-11 MED FILL — metFORMIN HCL ER 500 MG TB2: 500 | 90 days supply | Qty: 360 | Fill #0

## 2019-08-25 MED FILL — XARELTO 10 MG TABLET: 10 | 90 days supply | Qty: 90 | Fill #1

## 2019-08-25 MED FILL — TRULICITY 1.5 MG/0.5 ML PEN: 1.5 | 28 days supply | Qty: 2 | Fill #2

## 2019-09-03 MED FILL — AMOXICILLIN 500 MG CAPSULE: 500 | 7 days supply | Qty: 21 | Fill #0

## 2019-09-22 MED FILL — TRULICITY 1.5 MG/0.5 ML PEN: 1.5 | 28 days supply | Qty: 2 | Fill #0

## 2019-09-29 MED FILL — NOVOTWIST 32G X 5 MM MISC: 32G X 5 MM | 90 days supply | Qty: 100 | Fill #0

## 2019-10-06 ENCOUNTER — Ambulatory Visit: Payer: 59

## 2019-10-12 ENCOUNTER — Ambulatory Visit: Payer: 59

## 2019-10-16 MED FILL — LANTUS SOLOSTAR 100 UNITS/M: 100 | 83 days supply | Qty: 15 | Fill #2

## 2019-10-20 MED FILL — TRULICITY 1.5 MG/0.5 ML PEN: 1.5 | 28 days supply | Qty: 2 | Fill #1

## 2019-10-27 MED FILL — FREESTYLE LITE TEST STRIP: 50 days supply | Qty: 100 | Fill #0

## 2019-10-27 MED FILL — FREESTYLE LANCETS: 50 days supply | Qty: 100 | Fill #0

## 2019-10-27 MED FILL — EZETIMIBE-SIMVASTATIN 10-20: 10-20 | 90 days supply | Qty: 90 | Fill #0

## 2019-10-27 MED FILL — JARDIANCE 25 MG TABLET: 25 | 90 days supply | Qty: 90 | Fill #2

## 2019-10-27 MED FILL — FREESTYLE LITE METER: 30 days supply | Qty: 1 | Fill #0

## 2019-10-27 MED FILL — LEVOTHYROXINE 75 MCG TABLET: 75 | 90 days supply | Qty: 90 | Fill #0

## 2019-11-10 MED FILL — metFORMIN HCL ER 500 MG TB2: 500 | 90 days supply | Qty: 360 | Fill #0

## 2019-11-24 MED FILL — TRULICITY 1.5 MG/0.5 ML PEN: 1.5 | 28 days supply | Qty: 2 | Fill #2

## 2019-11-24 MED FILL — XARELTO 10 MG TABLET: 10 | 90 days supply | Qty: 90 | Fill #0

## 2019-11-27 ENCOUNTER — Other Ambulatory Visit (HOSPITAL_COMMUNITY): Payer: Self-pay | Admitting: Family Medicine

## 2019-12-18 ENCOUNTER — Other Ambulatory Visit (HOSPITAL_COMMUNITY): Payer: Self-pay | Admitting: Family Medicine

## 2019-12-18 DIAGNOSIS — E7800 Pure hypercholesterolemia, unspecified: Secondary | ICD-10-CM | POA: Diagnosis not present

## 2019-12-18 DIAGNOSIS — Z86711 Personal history of pulmonary embolism: Secondary | ICD-10-CM | POA: Diagnosis not present

## 2019-12-18 DIAGNOSIS — E032 Hypothyroidism due to medicaments and other exogenous substances: Secondary | ICD-10-CM | POA: Diagnosis not present

## 2019-12-18 DIAGNOSIS — E1169 Type 2 diabetes mellitus with other specified complication: Secondary | ICD-10-CM | POA: Diagnosis not present

## 2019-12-18 DIAGNOSIS — Z23 Encounter for immunization: Secondary | ICD-10-CM | POA: Diagnosis not present

## 2019-12-18 DIAGNOSIS — Z0000 Encounter for general adult medical examination without abnormal findings: Secondary | ICD-10-CM | POA: Diagnosis not present

## 2019-12-18 DIAGNOSIS — Z794 Long term (current) use of insulin: Secondary | ICD-10-CM | POA: Diagnosis not present

## 2019-12-25 MED FILL — TRULICITY 1.5 MG/0.5 ML PEN: 1.5 | 28 days supply | Qty: 2 | Fill #0

## 2020-01-19 MED FILL — NOVOTWIST 32G X 5 MM MISC: 32G X 5 MM | 90 days supply | Qty: 100 | Fill #1

## 2020-01-19 MED FILL — TRULICITY 1.5 MG/0.5 ML PEN: 1.5 | 28 days supply | Qty: 2 | Fill #1

## 2020-01-19 MED FILL — BASAGLAR 100 UNIT/ML KWIKPE: 100 | 86 days supply | Qty: 21 | Fill #0

## 2020-01-26 MED FILL — LEVOTHYROXINE 75 MCG TABLET: 75 | 90 days supply | Qty: 90 | Fill #0

## 2020-01-26 MED FILL — JARDIANCE 25 MG TABLET: 25 | 90 days supply | Qty: 90 | Fill #0

## 2020-02-03 MED FILL — EZETIMIBE-SIMVASTATIN 10-20: 10-20 | 90 days supply | Qty: 90 | Fill #0

## 2020-02-16 MED FILL — XARELTO 10 MG TABLET: 10 | 90 days supply | Qty: 90 | Fill #0

## 2020-02-16 MED FILL — TRULICITY 1.5 MG/0.5 ML PEN: 1.5 | 28 days supply | Qty: 2 | Fill #2

## 2020-03-15 MED FILL — TRULICITY 1.5 MG/0.5 ML PEN: 1.5 | 28 days supply | Qty: 2 | Fill #0

## 2020-04-12 MED FILL — TRULICITY 1.5 MG/0.5 ML PEN: 1.5 | 28 days supply | Qty: 2 | Fill #1

## 2020-04-25 MED FILL — JARDIANCE 25 MG TABLET: 25 | 90 days supply | Qty: 90 | Fill #1

## 2020-04-26 MED FILL — LEVOTHYROXINE 75 MCG TABLET: 75 | 90 days supply | Qty: 90 | Fill #1

## 2020-05-03 ENCOUNTER — Other Ambulatory Visit (HOSPITAL_COMMUNITY): Payer: Self-pay | Admitting: Family Medicine

## 2020-05-03 MED FILL — BASAGLAR 100 UNIT/ML KWIKPE: 100 | 86 days supply | Qty: 21 | Fill #1

## 2020-05-04 MED FILL — NOVOTWIST 32G X 5 MM MISC: 32G X 5 MM | 90 days supply | Qty: 100 | Fill #0

## 2020-05-11 MED FILL — EZETIMIBE-SIMVASTATIN 10-20: 10-20 | 90 days supply | Qty: 90 | Fill #1

## 2020-05-11 MED FILL — metFORMIN HCL ER 500 MG TB2: 500 | 90 days supply | Qty: 360 | Fill #1

## 2020-05-17 MED FILL — XARELTO 10 MG TABLET: 10 | 90 days supply | Qty: 90 | Fill #1

## 2020-05-17 MED FILL — TRULICITY 1.5 MG/0.5 ML PEN: 1.5 | 28 days supply | Qty: 2 | Fill #2

## 2020-06-05 ENCOUNTER — Emergency Department (HOSPITAL_BASED_OUTPATIENT_CLINIC_OR_DEPARTMENT_OTHER)
Admission: EM | Admit: 2020-06-05 | Discharge: 2020-06-06 | Disposition: A | Discharge status: Home / Self Care | Payer: 59 | Attending: Emergency Medicine | Admitting: Emergency Medicine

## 2020-06-05 ENCOUNTER — Other Ambulatory Visit: Payer: Self-pay

## 2020-06-05 ENCOUNTER — Encounter (HOSPITAL_BASED_OUTPATIENT_CLINIC_OR_DEPARTMENT_OTHER): Payer: Self-pay | Admitting: Emergency Medicine

## 2020-06-05 ENCOUNTER — Emergency Department (HOSPITAL_BASED_OUTPATIENT_CLINIC_OR_DEPARTMENT_OTHER): Payer: 59

## 2020-06-05 DIAGNOSIS — Z7984 Long term (current) use of oral hypoglycemic drugs: Secondary | ICD-10-CM | POA: Insufficient documentation

## 2020-06-05 DIAGNOSIS — R739 Hyperglycemia, unspecified: Secondary | ICD-10-CM

## 2020-06-05 DIAGNOSIS — R339 Retention of urine, unspecified: Secondary | ICD-10-CM | POA: Diagnosis not present

## 2020-06-05 DIAGNOSIS — E039 Hypothyroidism, unspecified: Secondary | ICD-10-CM | POA: Diagnosis not present

## 2020-06-05 DIAGNOSIS — N2889 Other specified disorders of kidney and ureter: Secondary | ICD-10-CM | POA: Diagnosis not present

## 2020-06-05 DIAGNOSIS — Z7901 Long term (current) use of anticoagulants: Secondary | ICD-10-CM | POA: Insufficient documentation

## 2020-06-05 DIAGNOSIS — I700 Atherosclerosis of aorta: Secondary | ICD-10-CM | POA: Diagnosis not present

## 2020-06-05 DIAGNOSIS — K8689 Other specified diseases of pancreas: Secondary | ICD-10-CM | POA: Diagnosis not present

## 2020-06-05 DIAGNOSIS — E1140 Type 2 diabetes mellitus with diabetic neuropathy, unspecified: Secondary | ICD-10-CM | POA: Diagnosis not present

## 2020-06-05 DIAGNOSIS — E1165 Type 2 diabetes mellitus with hyperglycemia: Secondary | ICD-10-CM | POA: Diagnosis not present

## 2020-06-05 DIAGNOSIS — R319 Hematuria, unspecified: Secondary | ICD-10-CM | POA: Diagnosis not present

## 2020-06-05 DIAGNOSIS — Z79890 Hormone replacement therapy (postmenopausal): Secondary | ICD-10-CM | POA: Diagnosis not present

## 2020-06-05 DIAGNOSIS — K7689 Other specified diseases of liver: Secondary | ICD-10-CM | POA: Diagnosis not present

## 2020-06-05 DIAGNOSIS — R310 Gross hematuria: Secondary | ICD-10-CM | POA: Diagnosis not present

## 2020-06-05 DIAGNOSIS — R338 Other retention of urine: Secondary | ICD-10-CM | POA: Diagnosis not present

## 2020-06-05 DIAGNOSIS — M47817 Spondylosis without myelopathy or radiculopathy, lumbosacral region: Secondary | ICD-10-CM | POA: Diagnosis not present

## 2020-06-05 LAB — URINALYSIS, ROUTINE W REFLEX MICROSCOPIC
Bilirubin Urine: NEGATIVE
Glucose, UA: >=500 mg/dL — AB
Ketones, ur: NEGATIVE mg/dL
Leukocytes,Ua: NEGATIVE
Nitrite: NEGATIVE
Protein, ur: >300 mg/dL — AB
Specific Gravity, Urine: 1.02 (ref 1.005–1.030)
pH: 6.5 (ref 5.0–8.0)

## 2020-06-05 LAB — BASIC METABOLIC PANEL
Anion gap: 11 (ref 5–15)
BUN: 25 mg/dL — ABNORMAL HIGH (ref 8–23)
CO2: 26 mmol/L (ref 22–32)
Calcium: 9.2 mg/dL (ref 8.9–10.3)
Chloride: 99 mmol/L (ref 98–111)
Creatinine, Ser: 1.44 mg/dL — ABNORMAL HIGH (ref 0.61–1.24)
GFR calc Af Amer: 58 mL/min — ABNORMAL LOW (ref >60)
GFR calc non Af Amer: 50 mL/min — ABNORMAL LOW (ref >60)
Glucose, Bld: 438 mg/dL — ABNORMAL HIGH (ref 70–99)
Potassium: 4.6 mmol/L (ref 3.5–5.1)
Sodium: 136 mmol/L (ref 135–145)

## 2020-06-05 LAB — CBC WITH DIFFERENTIAL/PLATELET
Abs Immature Granulocytes: 0.01 10*3/uL (ref 0.00–0.07)
Basophils Absolute: 0 10*3/uL (ref 0.0–0.1)
Basophils Relative: 1 %
Eosinophils Absolute: 0.1 10*3/uL (ref 0.0–0.5)
Eosinophils Relative: 2 %
HCT: 43.3 % (ref 39.0–52.0)
Hemoglobin: 14.3 g/dL (ref 13.0–17.0)
Immature Granulocytes: 0 %
Lymphocytes Relative: 15 %
Lymphs Abs: 0.9 10*3/uL (ref 0.7–4.0)
MCH: 29.1 pg (ref 26.0–34.0)
MCHC: 33 g/dL (ref 30.0–36.0)
MCV: 88.2 fL (ref 80.0–100.0)
Monocytes Absolute: 0.5 10*3/uL (ref 0.1–1.0)
Monocytes Relative: 9 %
Neutro Abs: 4.4 10*3/uL (ref 1.7–7.7)
Neutrophils Relative %: 73 %
Platelets: 209 10*3/uL (ref 150–400)
RBC: 4.91 MIL/uL (ref 4.22–5.81)
RDW: 12.3 % (ref 11.5–15.5)
WBC: 5.9 10*3/uL (ref 4.0–10.5)
nRBC: 0 % (ref 0.0–0.2)

## 2020-06-05 LAB — URINALYSIS, MICROSCOPIC (REFLEX): RBC / HPF: >50 RBC/hpf (ref 0–5)

## 2020-06-05 NOTE — ED Notes (Signed)
Frank blood in urine sample.

## 2020-06-05 NOTE — ED Provider Notes (Signed)
[facility] DEPT [facility] Provider Note: Georgena Spurling, MD, FACEP  CSN: [id number] MRN: [id number] ARRIVAL: 06/05/20 at 2054 ROOM: [place]  Hematuria   HISTORY OF PRESENT ILLNESS  06/05/20 11:07 PM [name] is a 64 y.o. male with a history of throat cancer diagnosed in 2009 which was successfully treated with chemotherapy and radiation.  He is here with gross hematuria that began this evening.  He describes his urine as looking like "red paint".  He is having no associated pain, fever, chills or other symptoms.  Nothing makes his symptoms better or worse.  He is on Xarelto for thromboembolic disease.   Past Medical History:  Diagnosis Date  . Alopecia    s/p chemotherapy  . Arthritis   . Bursitis   . 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 Diabetes mellitus without complication ([place])    takes Amaryl,Januvia,and Metformin,daily  . DVT, lower extremity ([place]) 2009   side effect from chemo and has blood clot left leg-wears compression stockings;was on Coumadin for 7months and has been off 5 yrs  . History of chemotherapy    Cisplatin/Taxotere,5-FU  . History of radiation therapy 01/28/08-03/11/08   left base tongue  . History of shingles   . Hyperlipidemia    takes Vytorin daily  . Hypothyroidism    takes Synthroid daily  . Joint pain   . Joint swelling   . Neuropathy    left foot  . 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 PE (pulmonary embolism) 12/07/2014  . Pneumonia   . Seborrheic keratosis    multiple on back  . Shortness of breath dyspnea     Past Surgical History:  Procedure Laterality Date  . CARDIAC CATHETERIZATION    . feeding tube placed  2009 and then removed  . JOINT REPLACEMENT    . KNEE SURGERY Left 1993  . MOUTH SURGERY    . PILONIDAL CYST / SINUS EXCISION  1974  . port a cath placement    . PORT-A-CATH REMOVAL  2010  . right wrist fracture Right 2004  . SHOULDER ARTHROSCOPY  WITH DISTAL CLAVICLE RESECTION Right 10/26/2016   Procedure: SHOULDER ARTHROSCOPY WITH DISTAL CLAVICLE RESECTION;  Surgeon: Justice Britain, MD;  Location: [facility];  Service: Orthopedics;  Laterality: Right;  . SHOULDER ARTHROSCOPY WITH ROTATOR CUFF REPAIR AND SUBACROMIAL DECOMPRESSION Left 07/16/2014   Procedure: LEFT SHOULDER ARTHROSCOPY WITH ROTATOR CUFF REPAIR AND SUBACROMIAL DECOMPRESSION/DISTAL CLAVICLE RESECTION;  Surgeon: Marin Shutter, MD;  Location: [facility];  Service: Orthopedics;  Laterality: Left;  . SHOULDER ARTHROSCOPY WITH ROTATOR CUFF REPAIR AND SUBACROMIAL DECOMPRESSION Right 10/26/2016   Procedure: RIGHT SHOULDER ARTHROSCOPY WITH ROTATOR CUFF REPAIR AND SUBACROMIAL DECOMPRESSION;  Surgeon: Justice Britain, MD;  Location: [facility];  Service: Orthopedics;  Laterality: Right;  requests 2hrs  . teeth extraction #9      Family History  Problem Relation Age of Onset  . Cancer Mother        uterine  . Diabetes Brother     Social History   Tobacco Use  . Smoking status: Never Smoker  . Smokeless tobacco: Never Used  Substance Use Topics  . Alcohol use: Yes    Comment: occasional drink   . Drug use: No    Prior to Admission medications   Medication Sig Start Date End Date Taking? Authorizing Provider  ezetimibe-simvastatin (VYTORIN) 10-20 MG per tablet Take 1 tablet by mouth at bedtime.  Yes [provider]  levothyroxine (SYNTHROID, LEVOTHROID) 75 MCG tablet Take 75 mcg by mouth daily before breakfast.   Yes [provider]  metFORMIN (GLUCOPHAGE-XR) 500 MG 24 hr tablet Take 1,000 mg by mouth 2 (two) times daily. 09/15/16  Yes [provider]  rivaroxaban (XARELTO) 20 MG TABS tablet Take 1 tablet (20 mg total) by mouth daily with supper. Patient taking differently: Take 20 mg by mouth daily.  01/01/15  Yes Eugenie Filler, MD  JARDIANCE 25 MG TABS tablet  01/26/20   [provider]  Liraglutide 18 MG/3ML SOPN Inject 1.2 mg into the skin at  bedtime.     [provider]  TRULICITY 1.5 HD/6.2IW SOPN  03/15/20   [provider]  enoxaparin (LOVENOX) 100 MG/ML injection Inject 100 mg into the skin daily. 10/02/16 06/05/20  [provider]  glimepiride (AMARYL) 4 MG tablet Take 4 mg by mouth 2 (two) times daily.   06/05/20  [provider]    Allergies No known allergies   REVIEW OF SYSTEMS  Negative except as noted here or in the History of Present Illness.   PHYSICAL EXAMINATION  Initial Vital Signs Blood pressure (!) 194/83, pulse 88, temperature 97.8 F (36.6 C), temperature source Oral, resp. rate 18, weight 87.4 kg, SpO2 100 %.  Examination General: Well-developed, well-nourished male in no acute distress; appearance consistent with age of record HENT: normocephalic; atraumatic Eyes: pupils equal, round and reactive to light; extraocular muscles intact Neck: supple Heart: regular rate and rhythm Lungs: clear to auscultation bilaterally Abdomen: soft; nondistended; nontender; no masses or hepatosplenomegaly; bowel sounds present GU: No CVA tenderness Extremities: No deformity; full range of motion; pulses normal Neurologic: Awake, alert and oriented; motor function intact in all extremities and symmetric; no facial droop Skin: Warm and dry Psychiatric: Normal mood and affect   RESULTS  Summary of this visit's results, reviewed and interpreted by myself:   EKG Interpretation  Date/Time:    Ventricular Rate:    PR Interval:    QRS Duration:   QT Interval:    QTC Calculation:   R Axis:     Text Interpretation:        Laboratory Studies: Results for orders placed or performed during the hospital encounter of 06/05/20 (from the past 24 hour(s))  Urinalysis, Routine w reflex microscopic     Status: Abnormal   Collection Time: 06/05/20  9:12 PM  Result Value Ref Range   Color, Urine RED (A) YELLOW   APPearance TURBID (A) CLEAR   Specific Gravity, Urine 1.020 1.005 - 1.030    pH 6.5 5.0 - 8.0   Glucose, UA >=500 (A) NEGATIVE mg/dL   Hgb urine dipstick LARGE (A) NEGATIVE   Bilirubin Urine NEGATIVE NEGATIVE   Ketones, ur NEGATIVE NEGATIVE mg/dL   Protein, ur >300 (A) NEGATIVE mg/dL   Nitrite NEGATIVE NEGATIVE   Leukocytes,Ua NEGATIVE NEGATIVE  Urinalysis, Microscopic (reflex)     Status: Abnormal   Collection Time: 06/05/20  9:12 PM  Result Value Ref Range   RBC / HPF >50 0 - 5 RBC/hpf   WBC, UA 0-5 0 - 5 WBC/hpf   Bacteria, UA FEW (A) NONE SEEN   Squamous Epithelial / LPF 0-5 0 - 5  CBC with Differential/Platelet     Status: None   Collection Time: 06/05/20 11:36 PM  Result Value Ref Range   WBC 5.9 4.0 - 10.5 K/uL   RBC 4.91 4.22 - 5.81 MIL/uL   Hemoglobin  14.3 13.0 - 17.0 g/dL   HCT 43.3 39 - 52 %   MCV 88.2 80.0 - 100.0 fL   MCH 29.1 26.0 - 34.0 pg   MCHC 33.0 30.0 - 36.0 g/dL   RDW 12.3 11.5 - 15.5 %   Platelets 209 150 - 400 K/uL   nRBC 0.0 0.0 - 0.2 %   Neutrophils Relative % 73 %   Neutro Abs 4.4 1.7 - 7.7 K/uL   Lymphocytes Relative 15 %   Lymphs Abs 0.9 0.7 - 4.0 K/uL   Monocytes Relative 9 %   Monocytes Absolute 0.5 0 - 1 K/uL   Eosinophils Relative 2 %   Eosinophils Absolute 0.1 0 - 0 K/uL   Basophils Relative 1 %   Basophils Absolute 0.0 0 - 0 K/uL   Immature Granulocytes 0 %   Abs Immature Granulocytes 0.01 0.00 - 0.07 K/uL  Basic metabolic panel     Status: Abnormal   Collection Time: 06/05/20 11:36 PM  Result Value Ref Range   Sodium 136 135 - 145 mmol/L   Potassium 4.6 3.5 - 5.1 mmol/L   Chloride 99 98 - 111 mmol/L   CO2 26 22 - 32 mmol/L   Glucose, Bld 438 (H) 70 - 99 mg/dL   BUN 25 (H) 8 - 23 mg/dL   Creatinine, Ser 1.44 (H) 0.61 - 1.24 mg/dL   Calcium 9.2 8.9 - 10.3 mg/dL   GFR calc non Af Amer 50 (L) >60 mL/min   GFR calc Af Amer 58 (L) >60 mL/min   Anion gap 11 5 - 15   Imaging Studies: CT ABDOMEN W CONTRAST  Result Date: 06/06/2020 CLINICAL DATA:  Right renal mass. EXAM: CT ABDOMEN WITH CONTRAST TECHNIQUE:  Multidetector CT imaging of the abdomen was performed using the standard protocol following bolus administration of intravenous contrast. CONTRAST:  76mL OMNIPAQUE IOHEXOL 300 MG/ML  SOLN COMPARISON:  CT dated 06/05/2020 FINDINGS: Lower chest: The lung bases are clear. The heart size is normal. Hepatobiliary: There are subtle hyperattenuating areas throughout the liver that appear to improve on the delayed phase. Normal gallbladder.There is no biliary ductal dilation. Pancreas: There is a somewhat hyperattenuating ill-defined masslike area at the level of the pancreatic head/uncinate process (axial series 2, image 52). Spleen: Unremarkable. Adrenals/Urinary Tract: --Adrenal glands: Unremarkable. --Right kidney/ureter: There is an amorphous hypoattenuating soft tissue mass involving the upper pole the right kidney measuring approximately 7.2 x 3.2 by 4.9 cm. There is no hydronephrosis. --Left kidney/ureter: No hydronephrosis or radiopaque kidney stones. --Urinary bladder: Outside the field of view. Stomach/Bowel: --Stomach/Duodenum: No hiatal hernia or other gastric abnormality. Normal duodenal course and caliber. --Small bowel: Unremarkable. --Colon: Unremarkable where visualized. --Appendix: Outside the field of view Vascular/Lymphatic: There are atherosclerotic changes of the abdominal aorta. The right renal vein is patent. --there are few mildly enlarged retroperitoneal lymph nodes measuring up to approximately 9 mm (axial series 2, image 51). --No mesenteric lymphadenopathy. Other: No ascites or free air. The abdominal wall is normal. Musculoskeletal. No acute displaced fractures. IMPRESSION: 1. There is an amorphous hypoattenuating soft tissue mass involving the upper pole the right kidney measuring approximately 7.2 x 3.2 x 4.9 cm. This is consistent with renal cell carcinoma until proven otherwise. 2. There is a somewhat hyperattenuating ill-defined masslike area at the level of the pancreatic head/uncinate  process. This is favored to represent an atypical appearance of normal pancreatic tissue. However, follow-up with a nonemergent outpatient contrast enhanced MRI is recommended. 3. There are few  mildly enlarged retroperitoneal lymph nodes measuring up to approximately 9 mm. These are concerning for nodal metastatic disease. 4. Subtle hyperattenuating areas are noted throughout the right left hepatic lobes. As these improve on the delayed images, there favored to be secondary to transient perfusion changes. However, in the setting of a renal mass, follow-up with a nonemergent contrast enhanced MRI is recommended to help exclude metastatic disease. Aortic Atherosclerosis (ICD10-I70.0). Electronically Signed   By: Constance Holster M.D.   On: 06/06/2020 01:01   CT Renal Stone Study  Result Date: 06/05/2020 CLINICAL DATA:  Hematuria. EXAM: CT ABDOMEN AND PELVIS WITHOUT CONTRAST TECHNIQUE: Multidetector CT imaging of the abdomen and pelvis was performed following the standard protocol without IV contrast. COMPARISON:  CT images from prior nuclear medicine PET/CT, dated August 11, 2008. FINDINGS: Lower chest: No acute abnormality. Hepatobiliary: No focal liver abnormality is seen. No gallstones, gallbladder wall thickening, or biliary dilatation. Pancreas: Unremarkable. No pancreatic ductal dilatation or surrounding inflammatory changes. Spleen: Normal in size without focal abnormality. Adrenals/Urinary Tract: Adrenal glands are unremarkable. Kidneys are normal in size, without renal calculi or hydronephrosis. Subcentimeter parapelvic renal cysts are seen on the left. A 2.3 cm x 1.3 cm isodense area, with thin surrounding calcified rim, is seen within the anteromedial aspect of the mid to upper left kidney. This represents a new finding when compared to the prior study. Bladder is unremarkable. Stomach/Bowel: Stomach is within normal limits. Appendix appears normal. No evidence of bowel wall thickening, distention,  or inflammatory changes. Vascular/Lymphatic: There is moderate severity calcification of the abdominal aorta and bilateral common iliac arteries, without evidence of aneurysmal dilatation. No enlarged abdominal or pelvic lymph nodes. Reproductive: Prostate is unremarkable. Other: No abdominal wall hernia or abnormality. No abdominopelvic ascites. Musculoskeletal: Marked severity degenerative changes seen at the level L5-S1. IMPRESSION: 1. 2.3 cm x 1.3 cm isodense area, with thin surrounding calcified rim, within the left kidney. This represents a new finding when compared to the prior study dated August 11, 2008. While this may represent a hemorrhagic cyst, correlation with renal ultrasound is recommended to exclude the presence of an underlying neoplastic process. 2. Marked severity degenerative changes at the level of L5-S1. 3. Aortic atherosclerosis. Aortic Atherosclerosis (ICD10-I70.0). Electronically Signed   By: Virgina Norfolk M.D.   On: 06/05/2020 23:15    ED COURSE and MDM  Nursing notes, initial and subsequent vitals signs, including pulse oximetry, reviewed and interpreted by myself.  Vitals:   06/05/20 2103 06/05/20 2107  BP: (!) 194/83   Pulse: 88   Resp: 18   Temp: 97.8 F (36.6 C)   TempSrc: Oral   SpO2: 100%   Weight:  87.4 kg   Medications  iohexol (OMNIPAQUE) 300 MG/ML solution 80 mL (80 mLs Intravenous Contrast Given 06/06/20 0030)   1:29 AM Patient and family member advised of CT findings consistent with renal cell carcinoma.  He was advised it is unclear if there is metastatic disease but he may need an MRI to further elucidate this.  We will refer to urology as he will likely need a kidney biopsy and possibly nephrectomy if this does turn out to be renal cell carcinoma.  He was advised to keep well-hydrated to prevent clots from forming in the collecting system.   PROCEDURES  Procedures   ED DIAGNOSES     ICD-10-CM   1. Right renal mass  N28.89   2. Gross  hematuria  R31.0   3. Hyperglycemia  R73.9  Minal Stuller, Jenny Reichmann, MD 06/06/20 [phone]

## 2020-06-05 NOTE — ED Triage Notes (Signed)
Hematuria x1 tonight. Denies pain

## 2020-06-06 ENCOUNTER — Emergency Department (HOSPITAL_COMMUNITY)
Admission: EM | Admit: 2020-06-06 | Discharge: 2020-06-06 | Disposition: A | Discharge status: Home / Self Care | Payer: 59 | Source: Home / Self Care | Attending: Emergency Medicine | Admitting: Emergency Medicine

## 2020-06-06 ENCOUNTER — Encounter (HOSPITAL_BASED_OUTPATIENT_CLINIC_OR_DEPARTMENT_OTHER): Payer: Self-pay

## 2020-06-06 ENCOUNTER — Emergency Department (HOSPITAL_BASED_OUTPATIENT_CLINIC_OR_DEPARTMENT_OTHER): Payer: 59

## 2020-06-06 DIAGNOSIS — Z79890 Hormone replacement therapy (postmenopausal): Secondary | ICD-10-CM | POA: Diagnosis not present

## 2020-06-06 DIAGNOSIS — K8689 Other specified diseases of pancreas: Secondary | ICD-10-CM | POA: Diagnosis not present

## 2020-06-06 DIAGNOSIS — R339 Retention of urine, unspecified: Secondary | ICD-10-CM

## 2020-06-06 DIAGNOSIS — E1140 Type 2 diabetes mellitus with diabetic neuropathy, unspecified: Secondary | ICD-10-CM | POA: Diagnosis not present

## 2020-06-06 DIAGNOSIS — R319 Hematuria, unspecified: Secondary | ICD-10-CM | POA: Insufficient documentation

## 2020-06-06 DIAGNOSIS — Z79899 Other long term (current) drug therapy: Secondary | ICD-10-CM | POA: Insufficient documentation

## 2020-06-06 DIAGNOSIS — R338 Other retention of urine: Secondary | ICD-10-CM | POA: Diagnosis not present

## 2020-06-06 DIAGNOSIS — E119 Type 2 diabetes mellitus without complications: Secondary | ICD-10-CM | POA: Insufficient documentation

## 2020-06-06 DIAGNOSIS — R310 Gross hematuria: Secondary | ICD-10-CM | POA: Diagnosis not present

## 2020-06-06 DIAGNOSIS — Z7984 Long term (current) use of oral hypoglycemic drugs: Secondary | ICD-10-CM | POA: Insufficient documentation

## 2020-06-06 DIAGNOSIS — Z85810 Personal history of malignant neoplasm of tongue: Secondary | ICD-10-CM | POA: Insufficient documentation

## 2020-06-06 DIAGNOSIS — Z794 Long term (current) use of insulin: Secondary | ICD-10-CM | POA: Insufficient documentation

## 2020-06-06 DIAGNOSIS — E039 Hypothyroidism, unspecified: Secondary | ICD-10-CM | POA: Insufficient documentation

## 2020-06-06 DIAGNOSIS — Z96652 Presence of left artificial knee joint: Secondary | ICD-10-CM | POA: Insufficient documentation

## 2020-06-06 DIAGNOSIS — I700 Atherosclerosis of aorta: Secondary | ICD-10-CM | POA: Diagnosis not present

## 2020-06-06 DIAGNOSIS — Z7901 Long term (current) use of anticoagulants: Secondary | ICD-10-CM | POA: Insufficient documentation

## 2020-06-06 DIAGNOSIS — E1165 Type 2 diabetes mellitus with hyperglycemia: Secondary | ICD-10-CM | POA: Diagnosis not present

## 2020-06-06 DIAGNOSIS — K7689 Other specified diseases of liver: Secondary | ICD-10-CM | POA: Diagnosis not present

## 2020-06-06 DIAGNOSIS — N2889 Other specified disorders of kidney and ureter: Secondary | ICD-10-CM | POA: Diagnosis not present

## 2020-06-06 LAB — I-STAT CHEM 8, ED
BUN: 24 mg/dL — ABNORMAL HIGH (ref 8–23)
Calcium, Ion: 1.12 mmol/L — ABNORMAL LOW (ref 1.15–1.40)
Chloride: 107 mmol/L (ref 98–111)
Creatinine, Ser: 1.1 mg/dL (ref 0.61–1.24)
Glucose, Bld: 314 mg/dL — ABNORMAL HIGH (ref 70–99)
HCT: 41 % (ref 39.0–52.0)
Hemoglobin: 13.9 g/dL (ref 13.0–17.0)
Potassium: 4.6 mmol/L (ref 3.5–5.1)
Sodium: 140 mmol/L (ref 135–145)
TCO2: 23 mmol/L (ref 22–32)

## 2020-06-06 LAB — URINALYSIS, ROUTINE W REFLEX MICROSCOPIC
Bilirubin Urine: NEGATIVE
Glucose, UA: >=500 mg/dL — AB
Ketones, ur: 5 mg/dL — AB
Leukocytes,Ua: NEGATIVE
Nitrite: NEGATIVE
Protein, ur: 100 mg/dL — AB
Specific Gravity, Urine: 1 — ABNORMAL LOW (ref 1.005–1.030)
pH: 6 (ref 5.0–8.0)

## 2020-06-06 MED ORDER — FENTANYL CITRATE (PF) 100 MCG/2ML IJ SOLN
25.0000 ug | Freq: Once | INTRAMUSCULAR | Status: AC
  Administered 2020-06-06: 25 ug via INTRAVENOUS
  Filled 2020-06-06: qty 2

## 2020-06-06 MED ORDER — CEPHALEXIN 500 MG PO CAPS: 500.0000 mg | ORAL_CAPSULE | Freq: Two times a day (BID) | ORAL | 0 refills | Status: DC

## 2020-06-06 MED ORDER — LIDOCAINE HCL URETHRAL/MUCOSAL 2 % EX GEL
1.0000 "application " | Freq: Once | CUTANEOUS | Status: AC | PRN
  Administered 2020-06-06: 1 via URETHRAL
  Filled 2020-06-06: qty 11

## 2020-06-06 MED ORDER — IOHEXOL 300 MG/ML  SOLN
80.0000 mL | Freq: Once | INTRAMUSCULAR | Status: AC | PRN
  Administered 2020-06-06: 80 mL via INTRAVENOUS

## 2020-06-06 NOTE — ED Notes (Signed)
Pt given leg bag and instructions on how  To use, drain , and maintain. Pt spouse also present for education. Both patient and spouse verbalize understanding

## 2020-06-06 NOTE — ED Triage Notes (Signed)
Patient reports inability to void x9 hours and feels distension. Patinet reports he was recently dx with bladder cancer

## 2020-06-06 NOTE — Consult Note (Addendum)
I have been asked to see the patient by Dr. Malvin Johns, for evaluation and management of gross hematuria and renal mass.  History of present illness: 64 year old man presented to [facility] last night with gross hematuria.  Patient had a CT of abdomen/pelvis which showed a 7 cm right renal mass concerning for renal cell carcinoma.  He was discharged home and returned to the [facility], ED today with urinary retention.  Foley catheter was placed with attempted irrigation.  The foley stopped drainage and catheter was changed multiple times.  Continuous bladder irrigation was initiated and Urology was consulted.  This is never happened to the patient before.  He is on Xarelto for history of DVTs/PEs the last of which occurred approximately 5 years ago.  Hemoglobin the ED last night was stable at 14.3.  No labs have been drawn yet.   Review of systems: A 12 point comprehensive review of systems was obtained and is negative unless otherwise stated in the history of present illness.  Patient Active Problem List   Diagnosis Date Noted  . Thyroid activity decreased   . Acute pulmonary embolism ([place]) 12/07/2014  . Diabetes mellitus type 2 in nonobese ([place]) 12/07/2014  . Cancer ([employer])   . History of angioplasty   . Depression   . DVT, lower extremity ([place])   . Osteoradionecrosis of mandible   . 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/20/2011  . Pulmonary embolism ([place]) 07/20/2011    No current facility-administered medications on file prior to encounter.   Current Outpatient Medications on File Prior to Encounter  Medication Sig Dispense Refill  . ezetimibe-simvastatin (VYTORIN) 10-20 MG per tablet Take 1 tablet by mouth at bedtime.      . Insulin Glargine (BASAGLAR KWIKPEN) 100 UNIT/ML Inject 18-24 Units into the skin daily.    [name] JARDIANCE 25 MG TABS tablet Take 25 mg by mouth daily.     [name]  levothyroxine (SYNTHROID, LEVOTHROID) 75 MCG tablet Take 75 mcg by mouth daily before breakfast.    . metFORMIN (GLUCOPHAGE-XR) 500 MG 24 hr tablet Take 1,000 mg by mouth 2 (two) times daily.  4  . Multiple Vitamins-Minerals (MULTI FOR HIM 50+ PO) Take 1 tablet by mouth daily.    . TRULICITY 1.5 KZ/6.0FU SOPN Inject 1.5 mg into the skin once a week. Sunday    . XARELTO 10 MG TABS tablet Take 10 mg by mouth daily.    . [DISCONTINUED] enoxaparin (LOVENOX) 100 MG/ML injection Inject 100 mg into the skin daily.  0  . [DISCONTINUED] glimepiride (AMARYL) 4 MG tablet Take 4 mg by mouth 2 (two) times daily.       Past Medical History:  Diagnosis Date  . Alopecia    s/p chemotherapy  . Arthritis   . Bursitis   . 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 Diabetes mellitus without complication ([place])    takes Amaryl,Januvia,and Metformin,daily  . DVT, lower extremity ([place]) 2009   side effect from chemo and has blood clot left leg-wears compression stockings;was on Coumadin for 27months and has been off 5 yrs  . History of chemotherapy    Cisplatin/Taxotere,5-FU  . History of radiation therapy 01/28/08-03/11/08   left base tongue  . History of shingles   . Hyperlipidemia    takes Vytorin daily  . Hypothyroidism    takes Synthroid daily  . Joint pain   .  Joint swelling   . Neuropathy    left foot  . 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 PE (pulmonary embolism) 12/07/2014  . Pneumonia   . Seborrheic keratosis    multiple on back  . Shortness of breath dyspnea     Past Surgical History:  Procedure Laterality Date  . CARDIAC CATHETERIZATION    . feeding tube placed  2009 and then removed  . JOINT REPLACEMENT    . KNEE SURGERY Left 1993  . MOUTH SURGERY    . PILONIDAL CYST / SINUS EXCISION  1974  . port a cath placement    . PORT-A-CATH REMOVAL  2010  . right wrist fracture Right 2004  . SHOULDER ARTHROSCOPY WITH DISTAL CLAVICLE RESECTION  Right 10/26/2016   Procedure: SHOULDER ARTHROSCOPY WITH DISTAL CLAVICLE RESECTION;  Surgeon: Justice Britain, MD;  Location: [facility];  Service: Orthopedics;  Laterality: Right;  . SHOULDER ARTHROSCOPY WITH ROTATOR CUFF REPAIR AND SUBACROMIAL DECOMPRESSION Left 07/16/2014   Procedure: LEFT SHOULDER ARTHROSCOPY WITH ROTATOR CUFF REPAIR AND SUBACROMIAL DECOMPRESSION/DISTAL CLAVICLE RESECTION;  Surgeon: Marin Shutter, MD;  Location: [facility];  Service: Orthopedics;  Laterality: Left;  . SHOULDER ARTHROSCOPY WITH ROTATOR CUFF REPAIR AND SUBACROMIAL DECOMPRESSION Right 10/26/2016   Procedure: RIGHT SHOULDER ARTHROSCOPY WITH ROTATOR CUFF REPAIR AND SUBACROMIAL DECOMPRESSION;  Surgeon: Justice Britain, MD;  Location: [facility];  Service: Orthopedics;  Laterality: Right;  requests 2hrs  . teeth extraction #9      Social History   Tobacco Use  . Smoking status: Never Smoker  . Smokeless tobacco: Never Used  Substance Use Topics  . Alcohol use: Yes    Comment: occasional drink   . Drug use: No    Family History  Problem Relation Age of Onset  . Cancer Mother        uterine  . Diabetes Brother     PE: Vitals:   06/06/20 1057 06/06/20 1216 06/06/20 1258  BP: (!) 176/85 (!) 183/105 (!) 183/84  Pulse: (!) 109 93 93  Resp: 20 (!) 23 18  Temp: 98.2 F (36.8 C)    TempSrc: Oral    SpO2: 100% 100% 95%   Patient appears to be in no acute distress  patient is alert and oriented x3 Atraumatic normocephalic head No cervical or supraclavicular lymphadenopathy appreciated No increased work of breathing, no audible wheezes/rhonchi Regular sinus rhythm/rate Abdomen is soft, nontender, nondistended, no CVA or suprapubic tenderness GU uncirc phallus (foreskin was noted to be behind glans and placed in anatomic position), 22Fr 3 way foley on CBI with clear pink tinged urine Lower extremities are symmetric without appreciable edema Grossly neurologically intact No identifiable skin lesions  Recent Labs     06/05/20 2336  WBC 5.9  HGB 14.3  HCT 43.3   Recent Labs    06/05/20 2336  NA 136  K 4.6  CL 99  CO2 26  GLUCOSE 438*  BUN 25*  CREATININE 1.44*  CALCIUM 9.2   No results for input(s): LABPT, INR in the last 72 hours. No results for input(s): LABURIN in the last 72 hours. No results found for this or any previous visit.  Imaging: CT Abd/Pelvis 06/06/20 IMPRESSION: 1. There is an amorphous hypoattenuating soft tissue mass involving the upper pole the right kidney measuring approximately 7.2 x 3.2 x 4.9 cm. This is consistent with renal cell carcinoma until proven otherwise. 2. There is a somewhat hyperattenuating ill-defined masslike area at the level of the pancreatic head/uncinate  process. This is favored to represent an atypical appearance of normal pancreatic tissue. However, follow-up with a nonemergent outpatient contrast enhanced MRI is recommended. 3. There are few mildly enlarged retroperitoneal lymph nodes measuring up to approximately 9 mm. These are concerning for nodal metastatic disease. 4. Subtle hyperattenuating areas are noted throughout the right left hepatic lobes. As these improve on the delayed images, there favored to be secondary to transient perfusion changes. However, in the setting of a renal mass, follow-up with a nonemergent contrast enhanced MRI is recommended to help exclude metastatic disease.  Assessment: 64 year old man who presented with gross hematuria and clot retention found to have a 7 cm right renal mass concerning for renal cell carcinoma.  Recommendations: 1. Urinary retention: Catheter in place during examination did not irrigate well so patient's catheter was exchanged for 24 French hematuria 3-way and 1 L irrigated and the clot burden was removed.  Final urine was light pink and draining well.  Continue Foley catheter with inflow capped until follow-up at [facility] urology for void trial.  2. Renal mass: Patient has newly  diagnosed 7 cm right renal mass with pulmonary nodules noted on CT scan.  Patient's wife used to work in the operating room at [employer] and she knows Dr. Alinda Money well.  She would like to follow-up with him regarding the renal mass.  You'll need an MRI of the chest to further evaluate lesion seen on CT.  3. Gross hematuria: Would like patient to hold Xarelto for next 2 to 3 days.  In addition patient will need cystoscopy to evaluate bladder prior to proceeding with surgery for renal mass.  Thank you for involving me in this patient's care.  Please page with any further questions or concerns. Donalee Gaumond D Jabar Krysiak

## 2020-06-06 NOTE — ED Provider Notes (Signed)
[facility] DEPT Provider Note   CSN: [id number] Arrival date & time: 06/06/20  1052     History Chief Complaint  Patient presents with   Dysuria   Hematuria    [name] is a 64 y.o. male.  Patient is a 64 year old male who presents with difficulty urinating.  He was seen last night at [facility] with hematuria.  He had a CT scan which showed a renal mass concerning for carcinoma.  He has a prior remote history of squamous cell carcinoma of the tongue.  He also has a history of diabetes, hypertension, hyperlipidemia and prior DVTs on Eliquis.  He had been able to urinate and was discharged from the ED.  He went to sleep and woke up this morning unable to urinate.  He felt distention in his abdomen and has not been able to void for about the last 9 hours.  He has some nausea but no vomiting.  No fevers.  No history of similar symptoms in the past.        Past Medical History:  Diagnosis Date   Alopecia    s/p chemotherapy   Arthritis    Bursitis    Cancer ([place]) 10/2007    tongue/squamous cell,stg IV,HPV positive   Diabetes mellitus without complication ([place])    takes Amaryl,Januvia,and Metformin,daily   DVT, lower extremity ([place]) 2009   side effect from chemo and has blood clot left leg-wears compression stockings;was on Coumadin for 33months and has been off 5 yrs   History of chemotherapy    Cisplatin/Taxotere,5-FU   History of radiation therapy 01/28/08-03/11/08   left base tongue   History of shingles    Hyperlipidemia    takes Vytorin daily   Hypothyroidism    takes Synthroid daily   Joint pain    Joint swelling    Neuropathy    left foot   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   left posterior    PE (pulmonary embolism) 12/07/2014   Pneumonia    Seborrheic keratosis    multiple on back   Shortness of breath dyspnea     Patient Active Problem List   Diagnosis Date Noted   Thyroid activity  decreased    Acute pulmonary embolism ([place]) 12/07/2014   Diabetes mellitus type 2 in nonobese ([place]) 12/07/2014   Cancer ([place])    History of angioplasty    Depression    DVT, lower extremity ([place])    Osteoradionecrosis of mandible    Malignant neoplasm of base of tongue ([place]) 07/20/2011   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/20/2011   Pulmonary embolism ([place]) 07/20/2011    Past Surgical History:  Procedure Laterality Date   CARDIAC CATHETERIZATION     feeding tube placed  2009 and then removed   JOINT REPLACEMENT     KNEE SURGERY Left [place]   port a cath placement     PORT-A-CATH REMOVAL  2010   right wrist fracture Right 2004   SHOULDER ARTHROSCOPY WITH DISTAL CLAVICLE RESECTION Right 10/26/2016   Procedure: SHOULDER ARTHROSCOPY WITH DISTAL CLAVICLE RESECTION;  Surgeon: Justice Britain, MD;  Location: [facility];  Service: Orthopedics;  Laterality: Right;   SHOULDER ARTHROSCOPY WITH ROTATOR CUFF REPAIR AND SUBACROMIAL DECOMPRESSION Left 07/16/2014   Procedure: LEFT SHOULDER ARTHROSCOPY WITH ROTATOR CUFF REPAIR AND SUBACROMIAL DECOMPRESSION/DISTAL CLAVICLE RESECTION;  Surgeon: Marin Shutter, MD;  Location: [place] [facility];  Service: Orthopedics;  Laterality: Left;   SHOULDER ARTHROSCOPY WITH ROTATOR CUFF REPAIR AND SUBACROMIAL DECOMPRESSION Right 10/26/2016   Procedure: RIGHT SHOULDER ARTHROSCOPY WITH ROTATOR CUFF REPAIR AND SUBACROMIAL DECOMPRESSION;  Surgeon: Justice Britain, MD;  Location: [facility];  Service: Orthopedics;  Laterality: Right;  requests 2hrs   teeth extraction #9         Family History  Problem Relation Age of Onset   Cancer Mother        uterine   Diabetes Brother     Social History   Tobacco Use   Smoking status: Never Smoker   Smokeless tobacco: Never Used  Substance Use Topics   Alcohol use: Yes    Comment: occasional drink    Drug use: No    Home  Medications Prior to Admission medications   Medication Sig Start Date End Date Taking? Authorizing Provider  ezetimibe-simvastatin (VYTORIN) 10-20 MG per tablet Take 1 tablet by mouth at bedtime.     Yes [provider]  Insulin Glargine (BASAGLAR KWIKPEN) 100 UNIT/ML Inject 18-24 Units into the skin daily. 05/03/20  Yes [provider]  JARDIANCE 25 MG TABS tablet Take 25 mg by mouth daily.  01/26/20  Yes [provider]  levothyroxine (SYNTHROID, LEVOTHROID) 75 MCG tablet Take 75 mcg by mouth daily before breakfast.   Yes [provider]  metFORMIN (GLUCOPHAGE-XR) 500 MG 24 hr tablet Take 1,000 mg by mouth 2 (two) times daily. 09/15/16  Yes [provider]  Multiple Vitamins-Minerals (MULTI FOR HIM 50+ PO) Take 1 tablet by mouth daily.   Yes [provider]  TRULICITY 1.5 MH/9.6QI SOPN Inject 1.5 mg into the skin once a week. Sunday 03/15/20  Yes [provider]  XARELTO 10 MG TABS tablet Take 10 mg by mouth daily. 05/17/20  Yes [provider]  cephALEXin (KEFLEX) 500 MG capsule Take 1 capsule (500 mg total) by mouth 2 (two) times daily. 06/06/20   Malvin Johns, MD  enoxaparin (LOVENOX) 100 MG/ML injection Inject 100 mg into the skin daily. 10/02/16 06/05/20  [provider]  glimepiride (AMARYL) 4 MG tablet Take 4 mg by mouth 2 (two) times daily.   06/05/20  [provider]    Allergies    Patient has no known allergies.  Review of Systems   Review of Systems  Constitutional: Negative for chills, diaphoresis, fatigue and fever.  HENT: Negative for congestion, rhinorrhea and sneezing.   Eyes: Negative.   Respiratory: Negative for cough, chest tightness and shortness of breath.   Cardiovascular: Negative for chest pain and leg swelling.  Gastrointestinal: Positive for abdominal distention and abdominal pain. Negative for blood in stool, diarrhea, nausea and vomiting.  Genitourinary: Positive for  difficulty urinating and hematuria. Negative for flank pain and frequency.  Musculoskeletal: Negative for arthralgias and back pain.  Skin: Negative for rash.  Neurological: Negative for dizziness, speech difficulty, weakness, numbness and headaches.    Physical Exam Updated Vital Signs BP (!) 152/75    Pulse 94    Temp 98.2 F (36.8 C) (Oral)    Resp 20    SpO2 97%   Physical Exam Constitutional:      Appearance: He is well-developed.  HENT:     Head: Normocephalic and atraumatic.  Eyes:     Pupils: Pupils are equal, round, and reactive to light.  Cardiovascular:     Rate and Rhythm: Normal rate and regular rhythm.     Heart sounds: Normal heart  sounds.  Pulmonary:     Effort: Pulmonary effort is normal. No respiratory distress.     Breath sounds: Normal breath sounds. No wheezing or rales.  Chest:     Chest wall: No tenderness.  Abdominal:     General: Bowel sounds are normal.     Palpations: Abdomen is soft.     Tenderness: There is abdominal tenderness (Mild suprapubic tenderness). There is no guarding or rebound.  Genitourinary:    Comments: Foley catheter in place, grossly bloody urine Musculoskeletal:        General: Normal range of motion.     Cervical back: Normal range of motion and neck supple.  Lymphadenopathy:     Cervical: No cervical adenopathy.  Skin:    General: Skin is warm and dry.     Findings: No rash.  Neurological:     Mental Status: He is alert and oriented to person, place, and time.     ED Results / Procedures / Treatments   Labs (all labs ordered are listed, but only abnormal results are displayed) Labs Reviewed  URINALYSIS, ROUTINE W REFLEX MICROSCOPIC - Abnormal; Notable for the following components:      Result Value   Color, Urine RED (*)    Specific Gravity, Urine 1.000 (*)    Glucose, UA >=500 (*)    Hgb urine dipstick MODERATE (*)    Ketones, ur 5 (*)    Protein, ur 100 (*)    Bacteria, UA FEW (*)    All other components  within normal limits  I-STAT CHEM 8, ED - Abnormal; Notable for the following components:   BUN 24 (*)    Glucose, Bld 314 (*)    Calcium, Ion 1.12 (*)    All other components within normal limits    EKG None  Radiology CT ABDOMEN W CONTRAST  Result Date: 06/06/2020 CLINICAL DATA:  Right renal mass. EXAM: CT ABDOMEN WITH CONTRAST TECHNIQUE: Multidetector CT imaging of the abdomen was performed using the standard protocol following bolus administration of intravenous contrast. CONTRAST:  62mL OMNIPAQUE IOHEXOL 300 MG/ML  SOLN COMPARISON:  CT dated 06/05/2020 FINDINGS: Lower chest: The lung bases are clear. The heart size is normal. Hepatobiliary: There are subtle hyperattenuating areas throughout the liver that appear to improve on the delayed phase. Normal gallbladder.There is no biliary ductal dilation. Pancreas: There is a somewhat hyperattenuating ill-defined masslike area at the level of the pancreatic head/uncinate process (axial series 2, image 52). Spleen: Unremarkable. Adrenals/Urinary Tract: --Adrenal glands: Unremarkable. --Right kidney/ureter: There is an amorphous hypoattenuating soft tissue mass involving the upper pole the right kidney measuring approximately 7.2 x 3.2 by 4.9 cm. There is no hydronephrosis. --Left kidney/ureter: No hydronephrosis or radiopaque kidney stones. --Urinary bladder: Outside the field of view. Stomach/Bowel: --Stomach/Duodenum: No hiatal hernia or other gastric abnormality. Normal duodenal course and caliber. --Small bowel: Unremarkable. --Colon: Unremarkable where visualized. --Appendix: Outside the field of view Vascular/Lymphatic: There are atherosclerotic changes of the abdominal aorta. The right renal vein is patent. --there are few mildly enlarged retroperitoneal lymph nodes measuring up to approximately 9 mm (axial series 2, image 51). --No mesenteric lymphadenopathy. Other: No ascites or free air. The abdominal wall is normal. Musculoskeletal. No acute  displaced fractures. IMPRESSION: 1. There is an amorphous hypoattenuating soft tissue mass involving the upper pole the right kidney measuring approximately 7.2 x 3.2 x 4.9 cm. This is consistent with renal cell carcinoma until proven otherwise. 2. There is a somewhat hyperattenuating ill-defined masslike area  at the level of the pancreatic head/uncinate process. This is favored to represent an atypical appearance of normal pancreatic tissue. However, follow-up with a nonemergent outpatient contrast enhanced MRI is recommended. 3. There are few mildly enlarged retroperitoneal lymph nodes measuring up to approximately 9 mm. These are concerning for nodal metastatic disease. 4. Subtle hyperattenuating areas are noted throughout the right left hepatic lobes. As these improve on the delayed images, there favored to be secondary to transient perfusion changes. However, in the setting of a renal mass, follow-up with a nonemergent contrast enhanced MRI is recommended to help exclude metastatic disease. Aortic Atherosclerosis (ICD10-I70.0). Electronically Signed   By: Constance Holster M.D.   On: 06/06/2020 01:01   CT Renal Stone Study  Result Date: 06/05/2020 CLINICAL DATA:  Hematuria. EXAM: CT ABDOMEN AND PELVIS WITHOUT CONTRAST TECHNIQUE: Multidetector CT imaging of the abdomen and pelvis was performed following the standard protocol without IV contrast. COMPARISON:  CT images from prior nuclear medicine PET/CT, dated August 11, 2008. FINDINGS: Lower chest: No acute abnormality. Hepatobiliary: No focal liver abnormality is seen. No gallstones, gallbladder wall thickening, or biliary dilatation. Pancreas: Unremarkable. No pancreatic ductal dilatation or surrounding inflammatory changes. Spleen: Normal in size without focal abnormality. Adrenals/Urinary Tract: Adrenal glands are unremarkable. Kidneys are normal in size, without renal calculi or hydronephrosis. Subcentimeter parapelvic renal cysts are seen on the  left. A 2.3 cm x 1.3 cm isodense area, with thin surrounding calcified rim, is seen within the anteromedial aspect of the mid to upper left kidney. This represents a new finding when compared to the prior study. Bladder is unremarkable. Stomach/Bowel: Stomach is within normal limits. Appendix appears normal. No evidence of bowel wall thickening, distention, or inflammatory changes. Vascular/Lymphatic: There is moderate severity calcification of the abdominal aorta and bilateral common iliac arteries, without evidence of aneurysmal dilatation. No enlarged abdominal or pelvic lymph nodes. Reproductive: Prostate is unremarkable. Other: No abdominal wall hernia or abnormality. No abdominopelvic ascites. Musculoskeletal: Marked severity degenerative changes seen at the level L5-S1. IMPRESSION: 1. 2.3 cm x 1.3 cm isodense area, with thin surrounding calcified rim, within the left kidney. This represents a new finding when compared to the prior study dated August 11, 2008. While this may represent a hemorrhagic cyst, correlation with renal ultrasound is recommended to exclude the presence of an underlying neoplastic process. 2. Marked severity degenerative changes at the level of L5-S1. 3. Aortic atherosclerosis. Aortic Atherosclerosis (ICD10-I70.0). Electronically Signed   By: Virgina Norfolk M.D.   On: 06/05/2020 23:15    Procedures Procedures (including critical care time)  Medications Ordered in ED Medications  lidocaine (XYLOCAINE) 2 % jelly 1 application (1 application Urethral Given 06/06/20 1152)  fentaNYL (SUBLIMAZE) injection 25 mcg (25 mcg Intravenous Given 06/06/20 1242)    ED Course  I have reviewed the triage vital signs and the nursing notes.  Pertinent labs & imaging results that were available during my care of the patient were reviewed by me and considered in my medical decision making (see chart for details).    MDM Rules/Calculators/A&P                          Patient is a  64 year old male who presents with urinary retention secondary to hematuria/clots.  He initially had a Foley catheter placed in triage.  This reportedly reclotted and the nurses changed it out again.  I asked for bladder irrigation.  The did attempt irrigation but it clotted up again.  I consulted Dr. Claudia Desanctis with urology who came in and has irrigated the patient's bladder.  The clots are clearing and the urine is now only pink-tinged.  He is feeling much better.  His hemoglobin is stable.  His creatinine is stable.  I will go ahead and start him on Keflex given the indwelling catheter.  His Foley will remain in place.  Dr. Claudia Desanctis will arrange follow-up with [facility].  The family will call in a few days if they have not heard from the office.  He will hold his Xarelto for 2 days and then restart it.  Return precautions were given. Final Clinical Impression(s) / ED Diagnoses Final diagnoses:  Urinary retention    Rx / DC Orders ED Discharge Orders         Ordered    cephALEXin (KEFLEX) 500 MG capsule  2 times daily        06/06/20 1451           Malvin Johns, MD 06/06/20 1455

## 2020-06-06 NOTE — Discharge Instructions (Addendum)
Make sure that you are drinking water to flush the clots.  Stop taking your Xarelto for 2 days and then resume it as directed.  Take the antibiotics as directed.  Follow-up with [facility] as discussed.  Return here as needed if you have any worsening symptoms or the Foley stops draining.

## 2020-06-08 DIAGNOSIS — Z23 Encounter for immunization: Secondary | ICD-10-CM | POA: Diagnosis not present

## 2020-06-09 DIAGNOSIS — R338 Other retention of urine: Secondary | ICD-10-CM | POA: Diagnosis not present

## 2020-06-09 DIAGNOSIS — D49511 Neoplasm of unspecified behavior of right kidney: Secondary | ICD-10-CM | POA: Diagnosis not present

## 2020-06-09 DIAGNOSIS — R310 Gross hematuria: Secondary | ICD-10-CM | POA: Diagnosis not present

## 2020-06-14 ENCOUNTER — Other Ambulatory Visit (HOSPITAL_COMMUNITY): Payer: Self-pay | Admitting: Family Medicine

## 2020-06-14 MED FILL — TRULICITY 1.5 MG/0.5 ML PEN: 1.5 | 84 days supply | Qty: 6 | Fill #0

## 2020-06-18 ENCOUNTER — Other Ambulatory Visit (HOSPITAL_COMMUNITY): Payer: Self-pay | Admitting: Urology

## 2020-06-18 ENCOUNTER — Other Ambulatory Visit: Payer: Self-pay | Admitting: Urology

## 2020-06-18 DIAGNOSIS — R932 Abnormal findings on diagnostic imaging of liver and biliary tract: Secondary | ICD-10-CM

## 2020-06-18 DIAGNOSIS — D49511 Neoplasm of unspecified behavior of right kidney: Secondary | ICD-10-CM

## 2020-06-22 ENCOUNTER — Ambulatory Visit: Payer: 59 | Attending: Internal Medicine

## 2020-06-22 ENCOUNTER — Other Ambulatory Visit (HOSPITAL_BASED_OUTPATIENT_CLINIC_OR_DEPARTMENT_OTHER): Payer: Self-pay | Admitting: Internal Medicine

## 2020-06-22 DIAGNOSIS — Z794 Long term (current) use of insulin: Secondary | ICD-10-CM | POA: Diagnosis not present

## 2020-06-22 DIAGNOSIS — E032 Hypothyroidism due to medicaments and other exogenous substances: Secondary | ICD-10-CM | POA: Diagnosis not present

## 2020-06-22 DIAGNOSIS — Z23 Encounter for immunization: Secondary | ICD-10-CM | POA: Diagnosis not present

## 2020-06-22 DIAGNOSIS — Z125 Encounter for screening for malignant neoplasm of prostate: Secondary | ICD-10-CM | POA: Diagnosis not present

## 2020-06-22 DIAGNOSIS — Z86711 Personal history of pulmonary embolism: Secondary | ICD-10-CM | POA: Diagnosis not present

## 2020-06-22 DIAGNOSIS — E7800 Pure hypercholesterolemia, unspecified: Secondary | ICD-10-CM | POA: Diagnosis not present

## 2020-06-22 DIAGNOSIS — E1169 Type 2 diabetes mellitus with other specified complication: Secondary | ICD-10-CM | POA: Diagnosis not present

## 2020-06-22 DIAGNOSIS — R944 Abnormal results of kidney function studies: Secondary | ICD-10-CM | POA: Diagnosis not present

## 2020-06-22 DIAGNOSIS — Z0000 Encounter for general adult medical examination without abnormal findings: Secondary | ICD-10-CM | POA: Diagnosis not present

## 2020-06-22 NOTE — Progress Notes (Signed)
   Covid-19 Vaccination Clinic  Name:  [name]    MRN: [id number] DOB: 05-17-1956  06/22/2020  [name] was observed post Covid-19 immunization for 15 minutes without incident. He was provided with Vaccine Information Sheet and instruction to access the V-Safe system.   [name] was instructed to call 911 with any severe reactions post vaccine: [name] Difficulty breathing  . Swelling of face and throat  . A fast heartbeat  . A bad rash all over body  . Dizziness and weakness

## 2020-06-28 ENCOUNTER — Other Ambulatory Visit (HOSPITAL_COMMUNITY): Payer: Self-pay | Admitting: Urology

## 2020-06-28 ENCOUNTER — Encounter (HOSPITAL_COMMUNITY): Payer: Self-pay

## 2020-06-28 ENCOUNTER — Ambulatory Visit (HOSPITAL_COMMUNITY): Admission: RE | Admit: 2020-06-28 | Payer: 59 | Source: Ambulatory Visit

## 2020-06-28 DIAGNOSIS — R932 Abnormal findings on diagnostic imaging of liver and biliary tract: Secondary | ICD-10-CM

## 2020-06-28 DIAGNOSIS — D49511 Neoplasm of unspecified behavior of right kidney: Secondary | ICD-10-CM

## 2020-06-29 ENCOUNTER — Other Ambulatory Visit (HOSPITAL_COMMUNITY): Payer: 59

## 2020-06-29 ENCOUNTER — Ambulatory Visit (HOSPITAL_COMMUNITY)
Admission: RE | Admit: 2020-06-29 | Discharge: 2020-06-29 | Disposition: A | Discharge status: Home / Self Care | Payer: 59 | Source: Ambulatory Visit | Attending: Urology | Admitting: Urology

## 2020-06-29 DIAGNOSIS — C641 Malignant neoplasm of right kidney, except renal pelvis: Secondary | ICD-10-CM | POA: Diagnosis not present

## 2020-06-29 DIAGNOSIS — K7689 Other specified diseases of liver: Secondary | ICD-10-CM | POA: Diagnosis not present

## 2020-06-29 DIAGNOSIS — R932 Abnormal findings on diagnostic imaging of liver and biliary tract: Secondary | ICD-10-CM | POA: Insufficient documentation

## 2020-06-29 DIAGNOSIS — D49511 Neoplasm of unspecified behavior of right kidney: Secondary | ICD-10-CM | POA: Diagnosis not present

## 2020-06-29 DIAGNOSIS — N2889 Other specified disorders of kidney and ureter: Secondary | ICD-10-CM | POA: Diagnosis not present

## 2020-06-29 MED ORDER — GADOBUTROL 1 MMOL/ML IV SOLN
8.0000 mL | Freq: Once | INTRAVENOUS | Status: AC | PRN
  Administered 2020-06-29: 8 mL via INTRAVENOUS

## 2020-06-29 MED FILL — PFIZER-BIONTECH COVID-19 VA: 30 | 1 days supply | Qty: 0 | Fill #0

## 2020-07-13 ENCOUNTER — Other Ambulatory Visit: Payer: Self-pay | Admitting: Urology

## 2020-07-14 ENCOUNTER — Other Ambulatory Visit: Payer: Self-pay | Admitting: Urology

## 2020-07-14 MED ORDER — MAGNESIUM CITRATE PO SOLN: 0.5000 | Freq: Once | ORAL | Status: AC

## 2020-07-19 MED FILL — JARDIANCE 25 MG TABLET: 25 | 90 days supply | Qty: 90 | Fill #2

## 2020-07-20 ENCOUNTER — Other Ambulatory Visit (HOSPITAL_COMMUNITY): Payer: Self-pay | Admitting: Urology

## 2020-07-20 DIAGNOSIS — C641 Malignant neoplasm of right kidney, except renal pelvis: Secondary | ICD-10-CM | POA: Diagnosis not present

## 2020-07-20 DIAGNOSIS — D49511 Neoplasm of unspecified behavior of right kidney: Secondary | ICD-10-CM | POA: Diagnosis not present

## 2020-07-20 DIAGNOSIS — R310 Gross hematuria: Secondary | ICD-10-CM | POA: Diagnosis not present

## 2020-07-20 MED FILL — ENOXAPARIN 80 MG/0.8 ML SYR: 80 | 10 days supply | Qty: 16 | Fill #0

## 2020-07-21 ENCOUNTER — Other Ambulatory Visit (HOSPITAL_COMMUNITY): Payer: Self-pay | Admitting: Urology

## 2020-07-21 DIAGNOSIS — D49511 Neoplasm of unspecified behavior of right kidney: Secondary | ICD-10-CM

## 2020-07-22 ENCOUNTER — Encounter (HOSPITAL_COMMUNITY): Payer: Self-pay | Admitting: Radiology

## 2020-07-22 NOTE — Progress Notes (Signed)
[name] Male, 64 y.o., 1955/12/21 MRN:  [id number] Phone:  [phone] Jerilynn Mages) PCP:  [name], MD Coverage:  [employer] Next Appt With [facility] PAT 5 07/27/2020 at 10:00 AM    RE: CT Biopsy Received: Today Sandi Mariscal, MD  Garth Bigness D D/W Alinda Money - concern for lymphoma as opposed to [facility].   OK for CT guided R renal mass Bx - CT - 10/3, image 43, series 2.   Infiltrative mass, may need IV contrast; would put in saline as lymphoma is on the differential.   Thx,  Ulice Dash       Previous Messages   ----- Message -----  From: Garth Bigness D  Sent: 07/21/2020 10:27 AM EST  To: Sandi Mariscal, MD  Subject: CT Biopsy                     Procedure:  CT Biopsy   Reason: Neoplasm of right kidney, infiltrative rt renal mass   History: CT, MR in computer   Provider: Raynelle Bring   Provider Contact: [phone]

## 2020-07-23 ENCOUNTER — Other Ambulatory Visit: Payer: Self-pay | Admitting: Urology

## 2020-07-25 MED FILL — LEVOTHYROXINE 75 MCG TABLET: 75 | 90 days supply | Qty: 90 | Fill #2

## 2020-07-26 DIAGNOSIS — J984 Other disorders of lung: Secondary | ICD-10-CM | POA: Diagnosis not present

## 2020-07-26 DIAGNOSIS — I2510 Atherosclerotic heart disease of native coronary artery without angina pectoris: Secondary | ICD-10-CM | POA: Diagnosis not present

## 2020-07-26 DIAGNOSIS — Z85528 Personal history of other malignant neoplasm of kidney: Secondary | ICD-10-CM | POA: Diagnosis not present

## 2020-07-26 DIAGNOSIS — Z85810 Personal history of malignant neoplasm of tongue: Secondary | ICD-10-CM | POA: Diagnosis not present

## 2020-07-26 DIAGNOSIS — D49511 Neoplasm of unspecified behavior of right kidney: Secondary | ICD-10-CM | POA: Diagnosis not present

## 2020-07-26 NOTE — Progress Notes (Addendum)
PCP - [name], MD  Cardiologist - no  PPM/ICD -  Device Orders -  Rep Notified -   Chest x-ray -  EKG -  Stress Test -  ECHO -  Cardiac Cath -   Sleep Study -  CPAP -   Fasting Blood Sugar -  Checks Blood Sugar _____ times a day  Blood Thinner Instructions:Xarelto  Last dose Thanksgiving lovenox bridge Aspirin Instructions:  ERAS Protcol - PRE-SURGERY Ensure or G2-   COVID TEST- 08-02-20  Activity- Walks 2 miles a day Anesthesia review: PE, DM,2, DVT, HgbA1c 8.6  Patient denies shortness of breath, fever, cough and chest pain at PAT appointment None    All instructions explained to the patient, with a verbal understanding of the material. Patient agrees to go over the instructions while at home for a better understanding. Patient also instructed to self quarantine after being tested for COVID-19. The opportunity to ask questions was provided.

## 2020-07-26 NOTE — Patient Instructions (Signed)
DUE TO COVID-19 ONLY ONE VISITOR IS ALLOWED TO COME WITH YOU AND STAY IN THE WAITING ROOM ONLY DURING PRE OP AND PROCEDURE DAY OF SURGERY. THE 1 VISITOR  MAY VISIT WITH YOU AFTER SURGERY IN YOUR PRIVATE ROOM DURING VISITING HOURS ONLY!  YOU NEED TO HAVE A COVID 19 TEST ON    11-29-21_______ @_______ , THIS TEST MUST BE DONE BEFORE SURGERY,  COVID TESTING SITE [address] [place] [id number], IT IS ON THE RIGHT GOING OUT [address] APPROXIMATELY  2 MINUTES PAST [employer] ON THE RIGHT. ONCE YOUR COVID TEST IS COMPLETED,  PLEASE BEGIN THE QUARANTINE INSTRUCTIONS AS OUTLINED IN YOUR HANDOUT.                ABRAHAM ENTWISTLE  07/26/2020   Your procedure is scheduled on: 08-05-20   Report to [facility] Main  Entrance   Report to short stay  at      0530  AM     Call this number if you have problems the morning of surgery [phone]    Remember: Do not eat food :After Midnight. You may have clear liquids until 0415 am then nothing by mouth     CLEAR LIQUID DIET   Foods Allowed                                                                      Foods Excluded  Black Coffee and tea, regular and decaf                                            liquids that you cannot  Plain Jell-O any favor except red or purple                                           see through such as: Fruit ices (not with fruit pulp)                                                           milk, soups, orange juice  Iced Popsicles                                                         All solid food Carbonated beverages, regular and diet                                    Cranberry, grape and apple juices Sports drinks like Gatorade Lightly seasoned clear broth or consume(fat free) Sugar, honey syrup _____________________________________________________________________     BRUSH YOUR TEETH MORNING OF SURGERY AND  RINSE YOUR MOUTH OUT, NO CHEWING GUM CANDY OR MINTS.     Take  these medicines the morning of surgery with A SIP OF WATER: levothyroxine, vytorin  Take 50 % of Lantus insulin dose night before  surgery and none day of surgery  Do not take metformin day of surgery  Hold Jardiance day before surgery and none day of surgery  NO trulicity day of surgery                               You may not have any metal on your body including hair pins and              piercings  Do not wear jewelry,  lotions, powders or perfumes, deodorant                        Men may shave face and neck.   Do not bring valuables to the hospital. [place].  Contacts, dentures or bridgework may not be worn into surgery.      Patients discharged the day of surgery will not be allowed to drive home. IF YOU ARE HAVING SURGERY AND GOING HOME THE SAME DAY, YOU MUST HAVE AN ADULT TO DRIVE YOU HOME AND BE WITH YOU FOR 24 HOURS. YOU MAY GO HOME BY TAXI OR UBER OR ORTHERWISE, BUT AN ADULT MUST ACCOMPANY YOU HOME AND STAY WITH YOU FOR 24 HOURS.  Name and phone number of your driver:  Special Instructions: N/A              Please read over the following fact sheets you were given: _____________________________________________________________________             [facility] - Preparing for Surgery Before surgery, you can play an important role.  Because skin is not sterile, your skin needs to be as free of germs as possible.  You can reduce the number of germs on your skin by washing with CHG (chlorahexidine gluconate) soap before surgery.  CHG is an antiseptic cleaner which kills germs and bonds with the skin to continue killing germs even after washing. Please DO NOT use if you have an allergy to CHG or antibacterial soaps.  If your skin becomes reddened/irritated stop using the CHG and inform your nurse when you arrive at Short Stay. Do not shave (including legs and underarms) for at least 48 hours prior to the first CHG shower.  You  may shave your face/neck. Please follow these instructions carefully:  1.  Shower with CHG Soap the night before surgery and the  morning of Surgery.  2.  If you choose to wash your hair, wash your hair first as usual with your  normal  shampoo.  3.  After you shampoo, rinse your hair and body thoroughly to remove the  shampoo.                           4.  Use CHG as you would any other liquid soap.  You can apply chg directly  to the skin and wash                       Gently with a scrungie or clean washcloth.  5.  Apply  the CHG Soap to your body ONLY FROM THE NECK DOWN.   Do not use on face/ open                           Wound or open sores. Avoid contact with eyes, ears mouth and genitals (private parts).                       Wash face,  Genitals (private parts) with your normal soap.             6.  Wash thoroughly, paying special attention to the area where your surgery  will be performed.  7.  Thoroughly rinse your body with warm water from the neck down.  8.  DO NOT shower/wash with your normal soap after using and rinsing off  the CHG Soap.                9.  Pat yourself dry with a clean towel.            10.  Wear clean pajamas.            11.  Place clean sheets on your bed the night of your first shower and do not  sleep with pets. Day of Surgery : Do not apply any lotions/deodorants the morning of surgery.  Please wear clean clothes to the hospital/surgery center.  FAILURE TO FOLLOW THESE INSTRUCTIONS MAY RESULT IN THE CANCELLATION OF YOUR SURGERY PATIENT SIGNATURE_________________________________  NURSE SIGNATURE__________________________________  ________________________________________________________________________   Adam Phenix  An incentive spirometer is a tool that can help keep your lungs clear and active. This tool measures how well you are filling your lungs with each breath. Taking long deep breaths may help reverse or decrease the chance of  developing breathing (pulmonary) problems (especially infection) following:  A long period of time when you are unable to move or be active. BEFORE THE PROCEDURE   If the spirometer includes an indicator to show your best effort, your nurse or respiratory therapist will set it to a desired goal.  If possible, sit up straight or lean slightly forward. Try not to slouch.  Hold the incentive spirometer in an upright position. INSTRUCTIONS FOR USE  1. Sit on the edge of your bed if possible, or sit up as far as you can in bed or on a chair. 2. Hold the incentive spirometer in an upright position. 3. Breathe out normally. 4. Place the mouthpiece in your mouth and seal your lips tightly around it. 5. Breathe in slowly and as deeply as possible, raising the piston or the ball toward the top of the column. 6. Hold your breath for 3-5 seconds or for as long as possible. Allow the piston or ball to fall to the bottom of the column. 7. Remove the mouthpiece from your mouth and breathe out normally. 8. Rest for a few seconds and repeat Steps 1 through 7 at least 10 times every 1-2 hours when you are awake. Take your time and take a few normal breaths between deep breaths. 9. The spirometer may include an indicator to show your best effort. Use the indicator as a goal to work toward during each repetition. 10. After each set of 10 deep breaths, practice coughing to be sure your lungs are clear. If you have an incision (the cut made at the time of surgery), support your incision when coughing by placing a pillow or rolled  up towels firmly against it. Once you are able to get out of bed, walk around indoors and cough well. You may stop using the incentive spirometer when instructed by your caregiver.  RISKS AND COMPLICATIONS  Take your time so you do not get dizzy or light-headed.  If you are in pain, you may need to take or ask for pain medication before doing incentive spirometry. It is harder to take a  deep breath if you are having pain. AFTER USE  Rest and breathe slowly and easily.  It can be helpful to keep track of a log of your progress. Your caregiver can provide you with a simple table to help with this. If you are using the spirometer at home, follow these instructions: [place] IF:   You are having difficultly using the spirometer.  You have trouble using the spirometer as often as instructed.  Your pain medication is not giving enough relief while using the spirometer.  You develop fever of 100.5 F (38.1 C) or higher. SEEK IMMEDIATE MEDICAL CARE IF:   You cough up bloody sputum that had not been present before.  You develop fever of 102 F (38.9 C) or greater.  You develop worsening pain at or near the incision site. MAKE SURE YOU:   Understand these instructions.  Will watch your condition.  Will get help right away if you are not doing well or get worse. Document Released: 01/01/2007 Document Revised: 11/13/2011 Document Reviewed: 03/04/2007 [facility] Patient Information 2014 [place], [place].   ________________________________________________________________________

## 2020-07-27 ENCOUNTER — Encounter (HOSPITAL_COMMUNITY)
Admission: RE | Admit: 2020-07-27 | Discharge: 2020-07-27 | Disposition: A | Discharge status: Home / Self Care | Payer: 59 | Source: Ambulatory Visit | Attending: Urology | Admitting: Urology

## 2020-07-27 ENCOUNTER — Other Ambulatory Visit: Payer: Self-pay

## 2020-07-27 ENCOUNTER — Encounter (HOSPITAL_COMMUNITY): Payer: Self-pay

## 2020-07-27 DIAGNOSIS — Z01818 Encounter for other preprocedural examination: Secondary | ICD-10-CM | POA: Insufficient documentation

## 2020-07-27 HISTORY — DX: Other specified disorders of kidney and ureter: N28.89

## 2020-07-27 LAB — CBC
HCT: 48.9 % (ref 39.0–52.0)
Hemoglobin: 15.5 g/dL (ref 13.0–17.0)
MCH: 28.8 pg (ref 26.0–34.0)
MCHC: 31.7 g/dL (ref 30.0–36.0)
MCV: 90.7 fL (ref 80.0–100.0)
Platelets: 252 10*3/uL (ref 150–400)
RBC: 5.39 MIL/uL (ref 4.22–5.81)
RDW: 12.4 % (ref 11.5–15.5)
WBC: 5.9 10*3/uL (ref 4.0–10.5)
nRBC: 0 % (ref 0.0–0.2)

## 2020-07-27 LAB — HEMOGLOBIN A1C
Hgb A1c MFr Bld: 8.6 % — ABNORMAL HIGH (ref 4.8–5.6)
Mean Plasma Glucose: 200.12 mg/dL

## 2020-07-27 LAB — BASIC METABOLIC PANEL
Anion gap: 10 (ref 5–15)
BUN: 26 mg/dL — ABNORMAL HIGH (ref 8–23)
CO2: 25 mmol/L (ref 22–32)
Calcium: 9.7 mg/dL (ref 8.9–10.3)
Chloride: 104 mmol/L (ref 98–111)
Creatinine, Ser: 1.16 mg/dL (ref 0.61–1.24)
GFR, Estimated: >60 mL/min (ref >60)
Glucose, Bld: 147 mg/dL — ABNORMAL HIGH (ref 70–99)
Potassium: 5.1 mmol/L (ref 3.5–5.1)
Sodium: 139 mmol/L (ref 135–145)

## 2020-07-27 LAB — GLUCOSE, CAPILLARY: Glucose-Capillary: 128 mg/dL — ABNORMAL HIGH (ref 70–99)

## 2020-07-28 NOTE — Anesthesia Preprocedure Evaluation (Addendum)
Anesthesia Evaluation  Patient identified by MRN, date of birth, ID band Patient awake    Reviewed: Allergy & Precautions, NPO status , Patient's Chart, lab work & pertinent test results  Airway Mallampati: II  TM Distance: >3 FB Neck ROM: Full    Dental  (+) Chipped, Dental Advisory Given   Pulmonary neg pulmonary ROS,    Pulmonary exam normal breath sounds clear to auscultation       Cardiovascular negative cardio ROS Normal cardiovascular exam Rhythm:Regular Rate:Normal     Neuro/Psych negative neurological ROS  negative psych ROS   GI/Hepatic negative GI ROS, Neg liver ROS,   Endo/Other  diabetesHypothyroidism   Renal/GU Renal disease  negative genitourinary   Musculoskeletal negative musculoskeletal ROS (+)   Abdominal   Peds negative pediatric ROS (+)  Hematology negative hematology ROS (+)   Anesthesia Other Findings   Reproductive/Obstetrics negative OB ROS                            Anesthesia Physical Anesthesia Plan  ASA: II  Anesthesia Plan: General   Post-op Pain Management:    Induction: Intravenous  PONV Risk Score and Plan: 2 and Ondansetron, Dexamethasone and Treatment may vary due to age or medical condition  Airway Management Planned: Oral ETT  Additional Equipment:   Intra-op Plan:   Post-operative Plan: Extubation in OR  Informed Consent: I have reviewed the patients History and Physical, chart, labs and discussed the procedure including the risks, benefits and alternatives for the proposed anesthesia with the patient or authorized representative who has indicated his/her understanding and acceptance.     Dental advisory given  Plan Discussed with: CRNA and Surgeon  Anesthesia Plan Comments: (See PAT 07/27/20, Konrad Felix, PA-C)       Anesthesia Quick Evaluation

## 2020-07-28 NOTE — Progress Notes (Signed)
Anesthesia Chart Review   Case: [id number] Date/Time: 08/05/20 0700   Procedures:      XI ROBOTIC ASSISTED LAPAROSCOPIC RADICAL NEPHRECTOMY (Right )     POSSIBLE LAPAROSCOPIC NEPHROURETERECTOMY (Right )     LYMPH NODE DISSECTION (Right )   Anesthesia type: General   Pre-op diagnosis: RIGHT RENAL NEOPLASM   Location: [facility] ROOM 03 / [facility]   Surgeons: Raynelle Bring, MD      DISCUSSION:64 y.o. 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 Raynelle Bring.   EKG at PAT with persistent STE V2-V3, no changes from previous EKG 2018.  Echo 2016 with normal ventricular size, systolic function normal, EF 55-60%.  Cardiac cath 2004 with normal coronary arteries, normal left ventricular function. Pt walks 2 miles per day without difficulty.  Discussed with Dr. Ambrose Pancoast.   Anticipate pt can proceed with planned procedure barring acute status change.   VS: BP 113/74   Pulse 94   Temp 36.6 C (Oral)   Resp 16   Ht 6\' 3"  (1.905 m)   Wt 83 kg   SpO2 99%   BMI 22.87 kg/m   PROVIDERS: [name], MD is PCP    LABS: Labs reviewed: Acceptable for surgery. (all labs ordered are listed, but only abnormal results are displayed)  Labs Reviewed  HEMOGLOBIN A1C - Abnormal; Notable for the following components:      Result Value   Hgb A1c MFr Bld 8.6 (*)    All other components within normal limits  BASIC METABOLIC PANEL - Abnormal; Notable for the following components:   Glucose, Bld 147 (*)    BUN 26 (*)    All other components within normal limits  GLUCOSE, CAPILLARY - Abnormal; Notable for the following components:   Glucose-Capillary 128 (*)    All other components within normal limits  CBC  TYPE AND SCREEN     IMAGES:   EKG: 07/27/2020 Rate 88 bpm  NSR Persistent STE V2-V3 Consider aneurysm  CV: Echo 12/08/2014 Study  Conclusions   - Left ventricle: The cavity size was normal. Systolic function was  normal. The estimated ejection fraction was in the range of 55%  to 60%. Wall motion was normal; there were no regional wall  motion abnormalities. There was an increased relative  contribution of atrial contraction to ventricular filling.  Doppler parameters are consistent with abnormal left ventricular  relaxation (grade 1 diastolic dysfunction).  - Aortic valve: Trileaflet; normal thickness, mildly calcified  leaflets.  - Right ventricle: Poorly visualized.   Past Medical History:  Diagnosis Date  . Alopecia    s/p chemotherapy  . Arthritis   . Bursitis    pt. denies  . Cancer ([place]) 10/2007    tongue/squamous cell,stg IV,HPV positive  . Diabetes mellitus without complication ([place])    takes Amaryl,Januvia,and Metformin,daily  . DVT, lower extremity ([place]) 2009   side effect from chemo and has blood clot left leg-wears compression stockings;was on Coumadin for 44months and has been off 5 yrs  . History of chemotherapy    Cisplatin/Taxotere,5-FU  . History of radiation therapy 01/28/08-03/11/08   left base tongue  . History of shingles   . Hyperlipidemia    takes Vytorin daily  . Hypothyroidism    takes Synthroid daily  . Joint pain   . Joint swelling   . Neuropathy  left foot  . 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 PE (pulmonary embolism) 12/07/2014  . Pneumonia    pt. denies  . Right kidney mass   . Seborrheic keratosis    multiple on back    Past Surgical History:  Procedure Laterality Date  . CARDIAC CATHETERIZATION    . feeding tube placed  2009 and then removed  . JOINT REPLACEMENT    . KNEE SURGERY Left 1993  . MOUTH SURGERY    . PILONIDAL CYST / SINUS EXCISION  1974  . port a cath placement    . PORT-A-CATH REMOVAL  2010  . right wrist fracture Right 2004  . SHOULDER ARTHROSCOPY WITH DISTAL CLAVICLE RESECTION Right 10/26/2016    Procedure: SHOULDER ARTHROSCOPY WITH DISTAL CLAVICLE RESECTION;  Surgeon: Justice Britain, MD;  Location: [facility];  Service: Orthopedics;  Laterality: Right;  . SHOULDER ARTHROSCOPY WITH ROTATOR CUFF REPAIR AND SUBACROMIAL DECOMPRESSION Left 07/16/2014   Procedure: LEFT SHOULDER ARTHROSCOPY WITH ROTATOR CUFF REPAIR AND SUBACROMIAL DECOMPRESSION/DISTAL CLAVICLE RESECTION;  Surgeon: Marin Shutter, MD;  Location: [place];  Service: Orthopedics;  Laterality: Left;  . SHOULDER ARTHROSCOPY WITH ROTATOR CUFF REPAIR AND SUBACROMIAL DECOMPRESSION Right 10/26/2016   Procedure: RIGHT SHOULDER ARTHROSCOPY WITH ROTATOR CUFF REPAIR AND SUBACROMIAL DECOMPRESSION;  Surgeon: Justice Britain, MD;  Location: [facility];  Service: Orthopedics;  Laterality: Right;  requests 2hrs  . teeth extraction #9      MEDICATIONS: . cephALEXin (KEFLEX) 500 MG capsule  . ezetimibe-simvastatin (VYTORIN) 10-20 MG per tablet  . Insulin Glargine (BASAGLAR KWIKPEN) 100 UNIT/ML  . JARDIANCE 25 MG TABS tablet  . levothyroxine (SYNTHROID, LEVOTHROID) 75 MCG tablet  . metFORMIN (GLUCOPHAGE-XR) 500 MG 24 hr tablet  . Multiple Vitamins-Minerals (MULTI FOR HIM 50+ PO)  . TRULICITY 1.5 TW/6.5KC SOPN  . XARELTO 10 MG TABS tablet   No current facility-administered medications for this encounter.   . magnesium citrate solution 0.5 Bottle    Konrad Felix, PA-C [facility] Pre-Surgical Testing [phone]

## 2020-07-30 ENCOUNTER — Other Ambulatory Visit (HOSPITAL_COMMUNITY): Payer: Self-pay | Admitting: Physician Assistant

## 2020-08-02 ENCOUNTER — Other Ambulatory Visit (HOSPITAL_COMMUNITY)
Admission: RE | Admit: 2020-08-02 | Discharge: 2020-08-02 | Disposition: A | Discharge status: Home / Self Care | Payer: 59 | Source: Ambulatory Visit | Attending: Urology | Admitting: Urology

## 2020-08-02 ENCOUNTER — Ambulatory Visit (HOSPITAL_COMMUNITY)
Admission: RE | Admit: 2020-08-02 | Discharge: 2020-08-02 | Disposition: A | Discharge status: Home / Self Care | Payer: 59 | Source: Ambulatory Visit | Attending: Urology | Admitting: Urology

## 2020-08-02 ENCOUNTER — Other Ambulatory Visit: Payer: Self-pay

## 2020-08-02 DIAGNOSIS — Z01812 Encounter for preprocedural laboratory examination: Secondary | ICD-10-CM | POA: Insufficient documentation

## 2020-08-02 DIAGNOSIS — Z7901 Long term (current) use of anticoagulants: Secondary | ICD-10-CM | POA: Diagnosis not present

## 2020-08-02 DIAGNOSIS — E7800 Pure hypercholesterolemia, unspecified: Secondary | ICD-10-CM | POA: Diagnosis present

## 2020-08-02 DIAGNOSIS — R112 Nausea with vomiting, unspecified: Secondary | ICD-10-CM | POA: Diagnosis not present

## 2020-08-02 DIAGNOSIS — Z833 Family history of diabetes mellitus: Secondary | ICD-10-CM | POA: Diagnosis not present

## 2020-08-02 DIAGNOSIS — Z86711 Personal history of pulmonary embolism: Secondary | ICD-10-CM | POA: Diagnosis not present

## 2020-08-02 DIAGNOSIS — Z794 Long term (current) use of insulin: Secondary | ICD-10-CM | POA: Diagnosis not present

## 2020-08-02 DIAGNOSIS — N2889 Other specified disorders of kidney and ureter: Secondary | ICD-10-CM | POA: Diagnosis not present

## 2020-08-02 DIAGNOSIS — N280 Ischemia and infarction of kidney: Secondary | ICD-10-CM | POA: Diagnosis not present

## 2020-08-02 DIAGNOSIS — E1142 Type 2 diabetes mellitus with diabetic polyneuropathy: Secondary | ICD-10-CM | POA: Diagnosis present

## 2020-08-02 DIAGNOSIS — Z79890 Hormone replacement therapy (postmenopausal): Secondary | ICD-10-CM | POA: Diagnosis not present

## 2020-08-02 DIAGNOSIS — E119 Type 2 diabetes mellitus without complications: Secondary | ICD-10-CM | POA: Diagnosis not present

## 2020-08-02 DIAGNOSIS — D49511 Neoplasm of unspecified behavior of right kidney: Secondary | ICD-10-CM

## 2020-08-02 DIAGNOSIS — Z20822 Contact with and (suspected) exposure to covid-19: Secondary | ICD-10-CM | POA: Insufficient documentation

## 2020-08-02 DIAGNOSIS — Z923 Personal history of irradiation: Secondary | ICD-10-CM | POA: Diagnosis not present

## 2020-08-02 DIAGNOSIS — Z7984 Long term (current) use of oral hypoglycemic drugs: Secondary | ICD-10-CM | POA: Diagnosis not present

## 2020-08-02 DIAGNOSIS — C641 Malignant neoplasm of right kidney, except renal pelvis: Secondary | ICD-10-CM | POA: Diagnosis not present

## 2020-08-02 DIAGNOSIS — Z9221 Personal history of antineoplastic chemotherapy: Secondary | ICD-10-CM | POA: Diagnosis not present

## 2020-08-02 DIAGNOSIS — Z85810 Personal history of malignant neoplasm of tongue: Secondary | ICD-10-CM | POA: Diagnosis not present

## 2020-08-02 DIAGNOSIS — Z79899 Other long term (current) drug therapy: Secondary | ICD-10-CM | POA: Diagnosis not present

## 2020-08-02 DIAGNOSIS — E039 Hypothyroidism, unspecified: Secondary | ICD-10-CM | POA: Diagnosis not present

## 2020-08-02 DIAGNOSIS — E785 Hyperlipidemia, unspecified: Secondary | ICD-10-CM | POA: Diagnosis not present

## 2020-08-02 DIAGNOSIS — Z8619 Personal history of other infectious and parasitic diseases: Secondary | ICD-10-CM | POA: Diagnosis not present

## 2020-08-02 DIAGNOSIS — Z86718 Personal history of other venous thrombosis and embolism: Secondary | ICD-10-CM | POA: Diagnosis not present

## 2020-08-02 DIAGNOSIS — I10 Essential (primary) hypertension: Secondary | ICD-10-CM | POA: Diagnosis not present

## 2020-08-02 LAB — CBC
HCT: 41.7 % (ref 39.0–52.0)
Hemoglobin: 13.1 g/dL (ref 13.0–17.0)
MCH: 28.2 pg (ref 26.0–34.0)
MCHC: 31.4 g/dL (ref 30.0–36.0)
MCV: 89.7 fL (ref 80.0–100.0)
Platelets: 208 10*3/uL (ref 150–400)
RBC: 4.65 MIL/uL (ref 4.22–5.81)
RDW: 12.1 % (ref 11.5–15.5)
WBC: 5.2 10*3/uL (ref 4.0–10.5)
nRBC: 0 % (ref 0.0–0.2)

## 2020-08-02 LAB — PROTIME-INR
INR: 1 (ref 0.8–1.2)
Prothrombin Time: 12.9 seconds (ref 11.4–15.2)

## 2020-08-02 LAB — GLUCOSE, CAPILLARY: Glucose-Capillary: 148 mg/dL — ABNORMAL HIGH (ref 70–99)

## 2020-08-02 LAB — SARS CORONAVIRUS 2 (TAT 6-24 HRS): SARS Coronavirus 2: NEGATIVE

## 2020-08-02 MED ORDER — LIDOCAINE HCL 1 % IJ SOLN
INTRAMUSCULAR | Status: AC
  Filled 2020-08-02: qty 20

## 2020-08-02 MED ORDER — SODIUM CHLORIDE 0.9 % IV SOLN: INTRAVENOUS | Status: DC

## 2020-08-02 MED ORDER — GELATIN ABSORBABLE 12-7 MM EX MISC
CUTANEOUS | Status: AC
  Filled 2020-08-02: qty 1

## 2020-08-02 MED ORDER — HYDROCODONE-ACETAMINOPHEN 5-325 MG PO TABS: 1.0000 | ORAL_TABLET | ORAL | Status: DC | PRN

## 2020-08-02 MED ORDER — FENTANYL CITRATE (PF) 100 MCG/2ML IJ SOLN
INTRAMUSCULAR | Status: AC | PRN
Start: 2020-08-02 — End: 2020-08-02
  Administered 2020-08-02: 50 ug via INTRAVENOUS

## 2020-08-02 MED ORDER — SODIUM CHLORIDE 0.9 % IV SOLN
INTRAVENOUS | Status: AC | PRN
  Administered 2020-08-02: 250 mL via INTRAVENOUS

## 2020-08-02 MED ORDER — MIDAZOLAM HCL 2 MG/2ML IJ SOLN
INTRAMUSCULAR | Status: AC | PRN
  Administered 2020-08-02: 1 mg via INTRAVENOUS

## 2020-08-02 MED ORDER — FENTANYL CITRATE (PF) 100 MCG/2ML IJ SOLN
INTRAMUSCULAR | Status: AC
  Filled 2020-08-02: qty 2

## 2020-08-02 MED ORDER — MIDAZOLAM HCL 2 MG/2ML IJ SOLN
INTRAMUSCULAR | Status: AC
  Filled 2020-08-02: qty 2

## 2020-08-02 NOTE — H&P (Signed)
Chief Complaint: Patient was seen in consultation today for right renal mass biopsy   Referring Physician(s): Raynelle Bring  Supervising Physician: Daryll Brod  Patient Status: [name] - Out-pt  History of Present Illness: [name] is a 64 y.o. 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  He presented to [facility] in early October 2021 with gross hematuria. A CT of the abdomen/pelvis revealed a 7 cm right renal mass concerning for renal cell carcinoma. He was discharged home and presented to the [facility] ED shortly thereafter with complaints of abdominal distention/tenderness and urinary retention; a foley catheter was placed and was removed four days later.    The patient is now followed by Dr. Alinda Money with urology and is scheduled for a laparoscopic right radical nephrectomy/possible nephroureterectomy and lymph node dissection 08/05/20. There is concern for possible lymphoma as opposed to renal cell carcinoma.  CT Abdomen w/contrast 06/06/20: IMPRESSION: 1. There is an amorphous hypoattenuating soft tissue mass involving the upper pole the right kidney measuring approximately 7.2 x 3.2 x 4.9 cm. This is consistent with renal cell carcinoma until proven otherwise. 2. There is a somewhat hyperattenuating ill-defined masslike area at the level of the pancreatic head/uncinate process. This is favored to represent an atypical appearance of normal pancreatic tissue. However, follow-up with a nonemergent outpatient contrast enhanced MRI is recommended. 3. There are few mildly enlarged retroperitoneal lymph nodes measuring up to approximately 9 mm. These are concerning for nodal metastatic disease. 4. Subtle hyperattenuating areas are noted throughout the right left hepatic lobes. As these improve on the delayed images, there favored to be secondary to  transient perfusion changes. However, in the setting of a renal mass, follow-up with a nonemergent contrast enhanced MRI is recommended to help exclude metastatic disease.  MR Abdomen with & without contrast 06/29/20 IMPRESSION: 1. Redemonstrated heterogeneously enhancing partially exophytic mass of the superior pole of the right kidney measuring approximately 7.3 x 4.9 x 3.5 cm. This remains consistent with renal cell carcinoma. 2. Mildly heterogeneous contrast enhancement of the liver parenchyma on early arterial phase, which is homogeneous on later phases. There is no underlying intrinsic signal abnormality and no focal abnormal contrast enhancement. Findings are consistent with transient perfusion variance, without evidence of metastatic disease.  Interventional Radiology has been asked to evaluate this patient for a right renal mass biopsy prior to his surgery. This case has been reviewed and procedure approved by Dr. Pascal Lux.   Past Medical History:  Diagnosis Date  . Alopecia    s/p chemotherapy  . Arthritis   . Bursitis    pt. denies  . Cancer ([place]) 10/2007    tongue/squamous cell,stg IV,HPV positive  . Diabetes mellitus without complication ([place])    takes Amaryl,Januvia,and Metformin,daily  . DVT, lower extremity ([place]) 2009   side effect from chemo and has blood clot left leg-wears compression stockings;was on Coumadin for 22months and has been off 5 yrs  . History of chemotherapy    Cisplatin/Taxotere,5-FU  . History of radiation therapy 01/28/08-03/11/08   left base tongue  . History of shingles   . Hyperlipidemia    takes Vytorin daily  . Hypothyroidism    takes Synthroid daily  . Joint pain   . Joint swelling   . Neuropathy    left foot  . 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 PE (  pulmonary embolism) 12/07/2014  . Pneumonia    pt. denies  . Right kidney mass   . Seborrheic keratosis    multiple on back    Past Surgical History:    Procedure Laterality Date  . CARDIAC CATHETERIZATION    . feeding tube placed  2009 and then removed  . JOINT REPLACEMENT    . KNEE SURGERY Left 1993  . MOUTH SURGERY    . PILONIDAL CYST / SINUS EXCISION  1974  . port a cath placement    . PORT-A-CATH REMOVAL  2010  . right wrist fracture Right 2004  . SHOULDER ARTHROSCOPY WITH DISTAL CLAVICLE RESECTION Right 10/26/2016   Procedure: SHOULDER ARTHROSCOPY WITH DISTAL CLAVICLE RESECTION;  Surgeon: Justice Britain, MD;  Location: [facility];  Service: Orthopedics;  Laterality: Right;  . SHOULDER ARTHROSCOPY WITH ROTATOR CUFF REPAIR AND SUBACROMIAL DECOMPRESSION Left 07/16/2014   Procedure: LEFT SHOULDER ARTHROSCOPY WITH ROTATOR CUFF REPAIR AND SUBACROMIAL DECOMPRESSION/DISTAL CLAVICLE RESECTION;  Surgeon: Marin Shutter, MD;  Location: [facility];  Service: Orthopedics;  Laterality: Left;  . SHOULDER ARTHROSCOPY WITH ROTATOR CUFF REPAIR AND SUBACROMIAL DECOMPRESSION Right 10/26/2016   Procedure: RIGHT SHOULDER ARTHROSCOPY WITH ROTATOR CUFF REPAIR AND SUBACROMIAL DECOMPRESSION;  Surgeon: Justice Britain, MD;  Location: [facility];  Service: Orthopedics;  Laterality: Right;  requests 2hrs  . teeth extraction #9      Allergies: Patient has no known allergies.  Medications: Prior to Admission medications   Medication Sig Start Date End Date Taking? Authorizing Provider  ezetimibe-simvastatin (VYTORIN) 10-20 MG per tablet Take 1 tablet by mouth at bedtime.     Yes [provider]  Insulin Glargine (BASAGLAR KWIKPEN) 100 UNIT/ML Inject 22 Units into the skin at bedtime.  05/03/20  Yes [provider]  JARDIANCE 25 MG TABS tablet Take 25 mg by mouth daily.  01/26/20  Yes [provider]  levothyroxine (SYNTHROID, LEVOTHROID) 75 MCG tablet Take 75 mcg by mouth daily before breakfast.   Yes [provider]  metFORMIN (GLUCOPHAGE-XR) 500 MG 24 hr tablet Take 1,000 mg by mouth 2 (two) times daily. 09/15/16  Yes [provider]   Multiple Vitamins-Minerals (MULTI FOR HIM 50+ PO) Take 1 tablet by mouth daily.   Yes [provider]  cephALEXin (KEFLEX) 500 MG capsule Take 1 capsule (500 mg total) by mouth 2 (two) times daily. Patient not taking: Reported on 07/21/2020 06/06/20   Malvin [place], MD  TRULICITY 1.5 PY/0.9XI SOPN Inject 1.5 mg into the skin once a week. Sunday 03/15/20   [provider]  XARELTO 10 MG TABS tablet Take 10 mg by mouth daily. 05/17/20   [provider]  enoxaparin (LOVENOX) 100 MG/ML injection Inject 100 mg into the skin daily. 10/02/16 06/05/20  [provider]  glimepiride (AMARYL) 4 MG tablet Take 4 mg by mouth 2 (two) times daily.   06/05/20  [provider]     Family History  Problem Relation Age of Onset  . Cancer Mother        uterine  . Diabetes Brother     Social History   Socioeconomic History  . Marital status: Married    Spouse name: Not on file  . Number of children: Not on file  . Years of education: Not on file  . Highest education level: Not on file  Occupational History  . Not on file  Tobacco Use  . Smoking status: Never Smoker  . Smokeless tobacco: Never Used  Vaping Use  . Vaping  Use: Never used  Substance and Sexual Activity  . Alcohol use: Yes    Comment: occasional drink   . Drug use: No  . Sexual activity: Yes  Other Topics Concern  . Not on file  Social History Narrative  . Not on file   Social Determinants of Health   Financial Resource Strain:   . Difficulty of Paying Living Expenses: Not on file  Food Insecurity:   . Worried About Charity fundraiser in the Last Year: Not on file  . Ran Out of Food in the Last Year: Not on file  Transportation Needs:   . Lack of Transportation (Medical): Not on file  . Lack of Transportation (Non-Medical): Not on file  Physical Activity:   . Days of Exercise per Week: Not on file  . Minutes of Exercise per Session: Not on file  Stress:   . Feeling of Stress :  Not on file  Social Connections:   . Frequency of Communication with Friends and Family: Not on file  . Frequency of Social Gatherings with Friends and Family: Not on file  . Attends Religious Services: Not on file  . Active Member of Clubs or Organizations: Not on file  . Attends Archivist Meetings: Not on file  . Marital Status: Not on file    Review of Systems: A 12 point ROS discussed and pertinent positives are indicated in the HPI above.  All other systems are negative.  Review of Systems  Constitutional: Negative for appetite change and fatigue.  Respiratory: Negative for cough and shortness of breath.   Cardiovascular: Negative for chest pain and leg swelling.  Gastrointestinal: Negative for abdominal pain, diarrhea, nausea and vomiting.  Genitourinary: Negative for hematuria.  Musculoskeletal: Negative for back pain.  Neurological: Negative for dizziness, light-headedness and headaches.  Hematological: Does not bruise/bleed easily.    Vital Signs: BP 136/78   Pulse 84   Temp 98.2 F (36.8 C) (Oral)   Ht 6\' 3"  (1.905 m)   Wt 190 lb (86.2 kg)   SpO2 99%   BMI 23.75 kg/m   Physical Exam Constitutional:      General: He is not in acute distress. HENT:     Mouth/Throat:     Mouth: Mucous membranes are moist.     Pharynx: Oropharynx is clear.  Cardiovascular:     Rate and Rhythm: Normal rate and regular rhythm.     Pulses: Normal pulses.     Heart sounds: Normal heart sounds.  Pulmonary:     Effort: Pulmonary effort is normal.     Breath sounds: Normal breath sounds.  Abdominal:     General: Bowel sounds are normal.     Palpations: Abdomen is soft.  Musculoskeletal:        General: Normal range of motion.  Skin:    General: Skin is warm and dry.  Neurological:     Mental Status: He is alert and oriented to person, place, and time.     Imaging: No results found.  Labs:  CBC: Recent Labs    06/05/20 2336 06/06/20 1418 07/27/20 1114  08/02/20 0709  WBC 5.9  --  5.9 5.2  HGB 14.3 13.9 15.5 13.1  HCT 43.3 41.0 48.9 41.7  PLT 209  --  252 208    COAGS: Recent Labs    08/02/20 0709  INR 1.0    BMP: Recent Labs    06/05/20 2336 06/06/20 1418 07/27/20 1114  NA 136 140 139  K 4.6 4.6 5.1  CL 99 107 104  CO2 26  --  25  GLUCOSE 438* 314* 147*  BUN 25* 24* 26*  CALCIUM 9.2  --  9.7  CREATININE 1.44* 1.10 1.16  GFRNONAA 50*  --  >60  GFRAA 58*  --   --     LIVER FUNCTION TESTS: No results for input(s): BILITOT, AST, ALT, ALKPHOS, PROT, ALBUMIN in the last 8760 hours.  TUMOR MARKERS: No results for input(s): AFPTM, CEA, CA199, CHROMGRNA in the last 8760 hours.  Assessment and Plan:  Right Renal Mass: [name]. [name], 64 year old male, presents today to the [facility] department for an image-guided right renal mass biopsy. Imaging is consistent with renal cell carcinoma but per discussions between Dr. Pascal Lux and Dr. Alinda Money there is a possibility for lymphoma. The patient is scheduled for a right nephrectomy 08/05/20 and a biopsy of this mass has been requested prior to surgery.   Risks and benefits of a right renal mass biopsy were discussed with the patient and/or patient's family including, but not limited to bleeding, infection, damage to adjacent structures or low yield requiring additional tests.  All of the questions were answered and there is agreement to proceed.  The patient has been NPO. Labs and vitals have been reviewed. His last dose of Xarelto was 07/29/20 and he has been on a Lovenox bridge since 07/30/20. His last dose of Lovenox was 08/01/20 at 1930.  Consent signed and in chart.  Thank you for this interesting consult.  I greatly enjoyed meeting [name] and look forward to participating in their care.  A copy of this report was sent to the requesting provider on this date.  Electronically Signed: Soyla Dryer, AGACNP-BC [phone] 08/02/2020, 8:07  AM   I spent a total of  30 Minutes   in face to face in clinical consultation, greater than 50% of which was counseling/coordinating care for right renal mass biopsy.

## 2020-08-02 NOTE — Procedures (Signed)
Interventional Radiology Procedure Note  Procedure: CT bx RUP renal mass    Complications: None  Estimated Blood Loss:  min  Findings: 18 g cores x4    M. Daryll Brod, MD

## 2020-08-02 NOTE — Sedation Documentation (Addendum)
Attempt x 1 made to call report to RN in Short Stay. Per Caryl Pina in [place] Stay, an RN will call back once available for report.

## 2020-08-02 NOTE — Discharge Instructions (Signed)
Restart  Lovenox tomorrow 11/30  Percutaneous Kidney Biopsy, Care After This sheet gives you information about how to care for yourself after your procedure. Your health care provider may also give you more specific instructions. If you have problems or questions, contact your health care provider. What can I expect after the procedure? After the procedure, it is common to have:  Pain or soreness near the biopsy site.  Pink or cloudy urine for 24 hours after the procedure. Follow these instructions at home: Activity  Return to your normal activities as told by your health care provider. Ask your health care provider what activities are safe for you.  If you were given a sedative during the procedure, it can affect you for several hours. Do not drive or operate machinery until your health care provider says that it is safe.  Do not lift anything that is heavier than 10 lb (4.5 kg), or the limit that you are told, until your health care provider says that it is safe.  Avoid activities that take a lot of effort (are strenuous) until your health care provider approves. Most people will have to wait 2 weeks before returning to activities such as exercise or sex. General instructions   Take over-the-counter and prescription medicines only as told by your health care provider.  You may eat and drink after your procedure. Follow instructions from your health care provider about eating or drinking restrictions.  Check your biopsy site every day for signs of infection. Check for: ? More redness, swelling, or pain. ? Fluid or blood. ? Warmth. ? Pus or a bad smell.  Keep all follow-up visits as told by your health care provider. This is important. Contact a health care provider if:  You have more redness, swelling, or pain around your biopsy site.  You have fluid or blood coming from your biopsy site.  Your biopsy site feels warm to the touch.  You have pus or a bad smell coming from  your biopsy site.  You have blood in your urine more than 24 hours after your procedure.  You have a fever. Get help right away if:  Your urine is dark red or brown.  You cannot urinate.  It burns when you urinate.  You feel dizzy or light-headed.  You have severe pain in your abdomen or side. Summary  After the procedure, it is common to have pain or soreness at the biopsy site and pink or cloudy urine for the first 24 hours.  Check your biopsy site each day for signs of infection, such as more redness, swelling, or pain; fluid, blood, pus or a bad smell coming from the biopsy site; or the biopsy site feeling warm to touch.  Return to your normal activities as told by your health care provider. This information is not intended to replace advice given to you by your health care provider. Make sure you discuss any questions you have with your health care provider. Document Revised: 04/24/2019 Document Reviewed: 04/24/2019 Elsevier Patient Education  [employer].

## 2020-08-04 LAB — SURGICAL PATHOLOGY

## 2020-08-04 NOTE — H&P (Signed)
Office Visit Report     07/20/2020   --------------------------------------------------------------------------------   [name]  MRN: [id number]  DOB: Aug 21, 1956, 64 year old Male  SSN:    PRIMARY CARE:  C Melinda Crutch, MD  REFERRING:  Simone Curia D. Claudia Desanctis, MD  PROVIDER:  Jacalyn Lefevre, M.D.  TREATING:  Raynelle Bring, M.D.  LOCATION:  [facility] [phone]     --------------------------------------------------------------------------------   CC/HPI: CC: Right renal neoplasm  Physician requesting consult: Dr. Jacalyn Lefevre  Primary care physician: Dr. Melinda Crutch   Mr. Kentner is a 64 year old gentleman who presented to the ED on 06/06/20 with gross hematuria and clot urinary retention. He was seen by Dr. Claudia Desanctis and a catheter was placed and a CT scan of the abdomen and pelvis with contrast revealed a 7.2 x 4.9 cm right renal mass concerning for renal cell carcinoma. His CT also indicated some non-pathologically enlarged regional lymph nodes and a questionable pancreatic lesion. His urine cleared and he passed a voiding trial a few days later. An MRI of the abdomen with and without contrast was then performed on 06/29/20 confirming an enhancing right renal mass concerning for renal malignancy. No pathologically enlarged lymph nodes were noted. The pancreas was confirmed to be normal. He is a nonsmoker. He is currently voiding well. He denies any hematuria since his catheter was removed. He remains on Xarelto.   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 He is NED from this.   Family history of kidney cancer: No.  Family history of ESRD: None.   Imaging: 06/29/20 (MRI)  Side of renal neoplasm: Right  Size of renal neoplasm: 7.3 cm  Location of renal neoplasm: Right upper pole  Exophytic or endophytic: Exophytic    Renal artery anatomy: Single early branching renal artery  Renal vein anatomy: Single renal vein   Contralateral renal lesions: None.  Regional lymphadenopathy: None.  Adrenal masses: None.  Renal vein/IVC involvement: No.  Metastatic disease to the abdomen: No.   Chest imaging: PENDING  LFTs: Normal.   Baseline renal function: Cr 1.0, eGFR 60 ml/min   PMH: Past medical history is significant for diabetes, hyperlipidemia, hypothyroidism, and h/o recurrent DVT.  PSH: No abdominal surgeries. Feeding tube during head and neck cancer treatment.     ALLERGIES: None   MEDICATIONS: Ezetimibe-Simvastatin 10 mg-20 mg tablet  Jardiance 25 mg tablet  Lantus 100 unit/ml vial  Levothyroxine 75 mcg capsule  Metformin Er Gastric 500 mg tablet, er gastric retention 24 hr  Trulicity 1.5 XQ/1.1 ml pen injector  Xarelto 10 mg tablet     GU PSH: None   NON-GU PSH: None   GU PMH: Gross hematuria - 06/09/2020 Right renal neoplasm - 06/09/2020 Urinary Retention - 06/09/2020    NON-GU PMH: Arthritis Diabetes Type 2 DVT, History Hypercholesterolemia Hyperthyroidism Personal history of malignant neoplasm of thyroid    FAMILY HISTORY: None   SOCIAL HISTORY: Marital Status: Married Preferred Language: English; Ethnicity: Not Hispanic Or Latino; Race: White    REVIEW OF SYSTEMS:    GU Review Male:   Patient denies leakage of urine, get up at night to urinate, stream starts and stops, trouble starting your streams, have to strain to urinate , hard to postpone urination, burning/ pain with urination, and frequent urination.  Gastrointestinal (Upper):   Patient denies nausea  and vomiting.  Gastrointestinal (Lower):   Patient denies diarrhea and constipation.  Constitutional:   Patient denies fever, night sweats, weight loss, and fatigue.  Skin:   Patient denies skin rash/ lesion and itching.  Eyes:   Patient denies blurred vision and double vision.  Ears/ Nose/ Throat:   Patient denies  sore throat and sinus problems.  Hematologic/Lymphatic:   Patient denies swollen glands and easy bruising.  Cardiovascular:   Patient denies leg swelling and chest pains.  Respiratory:   Patient denies cough and shortness of breath.  Endocrine:   Patient denies excessive thirst.  Musculoskeletal:   Patient denies back pain and joint pain.  Neurological:   Patient denies headaches and dizziness.  Psychologic:   Patient denies depression and anxiety.   VITAL SIGNS:      07/20/2020 09:30 AM  Weight 192 lb / 87.09 kg  Height 75 in / 190.5 cm  BP 157/76 mmHg  Pulse 86 /min  Temperature 98.0 F / 36.6 C  BMI 24.0 kg/m   MULTI-SYSTEM PHYSICAL EXAMINATION:    Constitutional: Well-nourished. No physical deformities. Normally developed. Good grooming.  Neck: Neck symmetrical, not swollen. Normal tracheal position.  Respiratory: No labored breathing, no use of accessory muscles. Clear bilaterally.  Cardiovascular: Normal temperature, normal extremity pulses, no swelling, no varicosities. Regular rate and rhythm.  Lymphatic: No enlargement of neck, axillae, groin.  Skin: No paleness, no jaundice, no cyanosis. No lesion, no ulcer, no rash.  Neurologic / Psychiatric: Oriented to time, oriented to place, oriented to person. No depression, no anxiety, no agitation.  Gastrointestinal: No mass, no tenderness, no rigidity, non obese abdomen.   Eyes: Normal conjunctivae. Normal eyelids.  Ears, Nose, Mouth, and Throat: Left ear no scars, no lesions, no masses. Right ear no scars, no lesions, no masses. Nose no scars, no lesions, no masses. Normal hearing. Normal lips.  Musculoskeletal: Normal gait and station of head and neck.     Complexity of Data:  Records Review:   Previous Patient Records  X-Ray Review: C.T. Abdomen/Pelvis: Reviewed Films.  MRI Abdomen: Reviewed Films.    Notes:                     CLINICAL DATA: Right renal mass.   EXAM:  CT ABDOMEN WITH CONTRAST   TECHNIQUE:   Multidetector CT imaging of the abdomen was performed using the  standard protocol following bolus administration of intravenous  contrast.   CONTRAST: 39m OMNIPAQUE IOHEXOL 300 MG/ML SOLN   COMPARISON: CT dated 06/05/2020   FINDINGS:  Lower chest: The lung bases are clear. The heart size is normal.   Hepatobiliary: There are subtle hyperattenuating areas throughout  the liver that appear to improve on the delayed phase. Normal  gallbladder.There is no biliary ductal dilation.   Pancreas: There is a somewhat hyperattenuating ill-defined masslike  area at the level of the pancreatic head/uncinate process (axial  series 2, image 52).   Spleen: Unremarkable.   Adrenals/Urinary Tract:   --Adrenal glands: Unremarkable.   --Right kidney/ureter: There is an amorphous hypoattenuating soft  tissue mass involving the upper pole the right kidney measuring  approximately 7.2 x 3.2 by 4.9 cm. There is no hydronephrosis.   --Left kidney/ureter: No hydronephrosis or radiopaque kidney stones.   --Urinary bladder: Outside the field of view.   Stomach/Bowel:   --Stomach/Duodenum: No hiatal hernia or other gastric abnormality.  Normal duodenal course and caliber.   --Small bowel: Unremarkable.   --Colon: Unremarkable where visualized.   --  Appendix: Outside the field of view   Vascular/Lymphatic: There are atherosclerotic changes of the  abdominal aorta. The right renal vein is patent.   --there are few mildly enlarged retroperitoneal lymph nodes  measuring up to approximately 9 mm (axial series 2, image 51).   --No mesenteric lymphadenopathy.   Other: No ascites or free air. The abdominal wall is normal.   Musculoskeletal. No acute displaced fractures.   IMPRESSION:  1. There is an amorphous hypoattenuating soft tissue mass involving  the upper pole the right kidney measuring approximately 7.2 x 3.2 x  4.9 cm. This is consistent with renal cell carcinoma until proven   otherwise.  2. There is a somewhat hyperattenuating ill-defined masslike area at  the level of the pancreatic head/uncinate process. This is favored  to represent an atypical appearance of normal pancreatic tissue.  However, follow-up with a nonemergent outpatient contrast enhanced  MRI is recommended.  3. There are few mildly enlarged retroperitoneal lymph nodes  measuring up to approximately 9 mm. These are concerning for nodal  metastatic disease.  4. Subtle hyperattenuating areas are noted throughout the right left  hepatic lobes. As these improve on the delayed images, there favored  to be secondary to transient perfusion changes. However, in the  setting of a renal mass, follow-up with a nonemergent contrast  enhanced MRI is recommended to help exclude metastatic disease.   Aortic Atherosclerosis (ICD10-I70.0).    Electronically Signed  By: Constance Holster M.D.  On: 06/06/2020 01:01   CLINICAL DATA: Evaluate abnormal liver contrast enhancement,  presumed right renal cell carcinoma, exclude hepatic metastatic  disease   EXAM:  MRI ABDOMEN WITHOUT AND WITH CONTRAST   TECHNIQUE:  Multiplanar multisequence MR imaging of the abdomen was performed  both before and after the administration of intravenous contrast.   CONTRAST: 88m GADAVIST GADOBUTROL 1 MMOL/ML IV SOLN   COMPARISON: CT abdomen pelvis, 06/06/2020   FINDINGS:  Lower chest: No acute findings.   Hepatobiliary: Mildly heterogeneous contrast enhancement of the  liver parenchyma on early arterial phase, which is homogeneous on  later phases. There is no underlying intrinsic signal abnormality  and no focal abnormal contrast enhancement. No mass or other  parenchymal abnormality identified.   Pancreas: No mass, inflammatory changes, or other parenchymal  abnormality identified.   Spleen: Within normal limits in size and appearance.   Adrenals/Urinary Tract: Redemonstrated heterogeneously enhancing   partially exophytic mass of the superior pole of the right kidney  measuring approximately 7.3 x 4.9 x 3.5 cm (series 20, image 29). No  evidence of hydronephrosis.   Stomach/Bowel: Visualized portions within the abdomen are  unremarkable.   Vascular/Lymphatic: No pathologically enlarged lymph nodes  identified. No abdominal aortic aneurysm demonstrated.   Other: None.   Musculoskeletal: No suspicious bone lesions identified.   IMPRESSION:  1. Redemonstrated heterogeneously enhancing partially exophytic mass  of the superior pole of the right kidney measuring approximately 7.3  x 4.9 x 3.5 cm. This remains consistent with renal cell carcinoma.  2. Mildly heterogeneous contrast enhancement of the liver parenchyma  on early arterial phase, which is homogeneous on later phases. There  is no underlying intrinsic signal abnormality and no focal abnormal  contrast enhancement. Findings are consistent with transient  perfusion variance, without evidence of metastatic disease.    Electronically Signed  By: AEddie CandleM.D.  On: 06/29/2020 15:52   PROCEDURES:         Flexible Cystoscopy - 52000  Indication: Gross hematuria Risks, benefits,  and potential complications of the procedure were discussed with the patient including infection, bleeding, voiding discomfort, urinary retention, fever, chills, sepsis, and others. All questions were answered. Informed consent was obtained. Sterile technique and intraurethral analgesia were used.  Meatus:  Normal size. Normal location. Normal condition.  Urethra:  No strictures.  External Sphincter:  Normal.  Verumontanum:  Normal.  Prostate:  Non-obstructing. No hyperplasia.  Bladder Neck:  Non-obstructing.  Ureteral Orifices:  Normal location. Normal size. Normal shape. Effluxed clear urine.  Bladder:  No trabeculation. No tumors. Normal mucosa. No stones.      Chaperone: Alleen Borne The procedure was well-tolerated and without complications.  Instructions were given to call the office immediately if questions or problems.         Urinalysis - 81003 Dipstick Dipstick Cont'd  Color: Yellow Bilirubin: Neg mg/dL  Appearance: Clear Ketones: Neg mg/dL  Specific Gravity: 1.020 Blood: Neg ery/uL  pH: <=5.0 Protein: Neg mg/dL  Glucose: 2+ mg/dL Urobilinogen: 0.2 mg/dL    Nitrites: Neg    Leukocyte Esterase: Neg leu/uL    ASSESSMENT:      ICD-10 Details  1 GU:   Right renal neoplasm - D49.511   2   Gross hematuria - R31.0    PLAN:            Medications New Meds: Lovenox 80 mg/0.8 ml syringe 0.8 ml or one syringe every 12 hours (begin 3 days prior to surgery and stop 12 hrs before surgery)   #20  0 Refill(s)            Orders Labs CBC with Diff, CMP, LDH          Schedule X-Rays: 1 Week - C.T. Chest With I.V. Contrast  Return Visit/Planned Activity: Other See Visit Notes             Note: Will call with scan results.          Document Letter(s):  Created for Patient: Clinical Summary         Notes:   1. Gross hematuria: Cystoscopy is unremarkable today indicating that his right renal tumor he is the likely cause for his hematuria. Fortunately, he has not had ongoing issues with hematuria since his catheter removal.   2. Right renal neoplasm: This is highly concerning for malignancy although is quite infiltrative. As such, does raise a question as to whether this is simply an infiltrative renal cell carcinoma vs. another malignancy such as lymphoma or urothelial carcinoma. I did review his imaging today with Dr. Jobe Igo. He agrees that this lesion is infiltrative in could potentially be in if treated renal cell carcinoma vs. other malignancy as stated above. As such, I do think it would be advisable to proceed with a renal biopsy for confirmatory purposes as this may change management.   The patient was provided information regarding their renal mass including the relative risk of benign versus malignant pathology and the  natural history of renal cell carcinoma and other possible malignancies of the kidney. The role of renal biopsy, laboratory testing, and imaging studies to further characterize renal masses and/or the presence of metastatic disease were explained. We discussed the role of active surveillance, surgical therapy with both radical nephrectomy and nephron-sparing surgery, and ablative therapy in the treatment of renal masses. In addition, we discussed our goals of providing an accurate diagnosis and oncologic control while maintaining optimal renal function as appropriate based on the size, location, and complexity of their renal mass as well  as their co-morbidities.   We have discussed the risks of treatment in detail including but not limited to bleeding, infection, heart attack, stroke, death, venothromoboembolism, cancer recurrence, injury/damage to surrounding organs and structures, urine leak, the possibility of open surgical conversion for patients undergoing minimally invasive surgery, the risk of developing chronic kidney disease and its associated implications, and the potential risk of end stage renal disease possibly necessitating dialysis.   He will proceed with laboratory evaluation and chest imaging to complete his metastatic evaluation. He will be prescribed Lovenox to take 80 mg [place] q 12 hrs after stopping Xarelto 3 days before surgery and he knows not to take this 12 hrs before his surgery.   He is tentatively planned to proceed with a right laparoscopic radical nephrectomy. However, I will plan to proceed with a renal mass biopsy 1st to confirm his diagnosis.   CC: Dr. Melinda Crutch        Next Appointment:      Next Appointment: 08/05/2020 07:15 AM    Appointment Type: Surgery     Location: [facility] [phone]    Provider: Raynelle Bring, M.D.    Reason for Visit: WL/IP RIGHT LAP RAD NEPHPRECTOMY WITH AMANDA      * Signed by Raynelle Bring, M.D. on 07/20/20 at 7:26  PM (EST)*       APPENDED NOTES:  Will proceed with biopsy on 11/29. He has been instructed to stop Xarelto on 11/29 and begin Lovenox 80 mg [place] q 12 hrs. He will stop this at least 12 hours prior to his biopsy. He will restart when advised by IR and then again stop 12 hrs before his surgery on 12/2.     * Signed by Raynelle Bring, M.D. on 07/22/20 at 2:52 PM (EST)*        Due to the fact that he does have some questionable lymphadenopathy, I will plan to proceed with a right robot assisted laparoscopic approach to his radical nephrectomy. He also is going to undergo a renal mass biopsy. If this confirmed a urothelial tumor, we will be prepared to perform a full nephroureterectomy as well.     * Signed by Raynelle Bring, M.D. 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 Will proceed with a radical nephrectomy as planned.    * Signed by Raynelle Bring, M.D. on 08/03/20 at 9:46 AM (EST)*

## 2020-08-05 ENCOUNTER — Encounter (HOSPITAL_COMMUNITY)
Admission: RE | Disposition: A | Discharge status: Home / Self Care | Payer: Self-pay | Source: Home / Self Care | Attending: Urology

## 2020-08-05 ENCOUNTER — Encounter (HOSPITAL_COMMUNITY): Payer: Self-pay | Admitting: Urology

## 2020-08-05 ENCOUNTER — Inpatient Hospital Stay (HOSPITAL_COMMUNITY): Payer: 59 | Admitting: Certified Registered Nurse Anesthetist

## 2020-08-05 ENCOUNTER — Other Ambulatory Visit: Payer: Self-pay

## 2020-08-05 ENCOUNTER — Inpatient Hospital Stay (HOSPITAL_COMMUNITY): Payer: 59 | Admitting: Physician Assistant

## 2020-08-05 ENCOUNTER — Inpatient Hospital Stay (HOSPITAL_COMMUNITY)
Admission: RE | Admit: 2020-08-05 | Discharge: 2020-08-07 | DRG: 658 | Disposition: A | Discharge status: Home / Self Care | Payer: 59 | Attending: Urology | Admitting: Urology

## 2020-08-05 DIAGNOSIS — E7800 Pure hypercholesterolemia, unspecified: Secondary | ICD-10-CM | POA: Diagnosis present

## 2020-08-05 DIAGNOSIS — E039 Hypothyroidism, unspecified: Secondary | ICD-10-CM | POA: Diagnosis present

## 2020-08-05 DIAGNOSIS — Z794 Long term (current) use of insulin: Secondary | ICD-10-CM

## 2020-08-05 DIAGNOSIS — Z833 Family history of diabetes mellitus: Secondary | ICD-10-CM | POA: Diagnosis not present

## 2020-08-05 DIAGNOSIS — Z923 Personal history of irradiation: Secondary | ICD-10-CM

## 2020-08-05 DIAGNOSIS — E785 Hyperlipidemia, unspecified: Secondary | ICD-10-CM | POA: Diagnosis present

## 2020-08-05 DIAGNOSIS — Z86718 Personal history of other venous thrombosis and embolism: Secondary | ICD-10-CM | POA: Diagnosis not present

## 2020-08-05 DIAGNOSIS — Z7901 Long term (current) use of anticoagulants: Secondary | ICD-10-CM | POA: Diagnosis not present

## 2020-08-05 DIAGNOSIS — Z79899 Other long term (current) drug therapy: Secondary | ICD-10-CM

## 2020-08-05 DIAGNOSIS — Z7984 Long term (current) use of oral hypoglycemic drugs: Secondary | ICD-10-CM

## 2020-08-05 DIAGNOSIS — I10 Essential (primary) hypertension: Secondary | ICD-10-CM | POA: Diagnosis present

## 2020-08-05 DIAGNOSIS — Z79890 Hormone replacement therapy (postmenopausal): Secondary | ICD-10-CM | POA: Diagnosis not present

## 2020-08-05 DIAGNOSIS — R112 Nausea with vomiting, unspecified: Secondary | ICD-10-CM | POA: Diagnosis not present

## 2020-08-05 DIAGNOSIS — E1142 Type 2 diabetes mellitus with diabetic polyneuropathy: Secondary | ICD-10-CM | POA: Diagnosis present

## 2020-08-05 DIAGNOSIS — Z8619 Personal history of other infectious and parasitic diseases: Secondary | ICD-10-CM

## 2020-08-05 DIAGNOSIS — Z9221 Personal history of antineoplastic chemotherapy: Secondary | ICD-10-CM

## 2020-08-05 DIAGNOSIS — Z86711 Personal history of pulmonary embolism: Secondary | ICD-10-CM

## 2020-08-05 DIAGNOSIS — Z85810 Personal history of malignant neoplasm of tongue: Secondary | ICD-10-CM | POA: Diagnosis not present

## 2020-08-05 DIAGNOSIS — Z20822 Contact with and (suspected) exposure to covid-19: Secondary | ICD-10-CM | POA: Diagnosis present

## 2020-08-05 DIAGNOSIS — C641 Malignant neoplasm of right kidney, except renal pelvis: Secondary | ICD-10-CM | POA: Diagnosis present

## 2020-08-05 DIAGNOSIS — E119 Type 2 diabetes mellitus without complications: Secondary | ICD-10-CM | POA: Diagnosis not present

## 2020-08-05 HISTORY — PX: LYMPH NODE DISSECTION: SHX5087

## 2020-08-05 HISTORY — PX: ROBOT ASSISTED LAPAROSCOPIC NEPHRECTOMY: SHX5140

## 2020-08-05 LAB — ABO/RH: ABO/RH(D): A POS

## 2020-08-05 LAB — BASIC METABOLIC PANEL
Anion gap: 16 — ABNORMAL HIGH (ref 5–15)
BUN: 25 mg/dL — ABNORMAL HIGH (ref 8–23)
CO2: 22 mmol/L (ref 22–32)
Calcium: 8.6 mg/dL — ABNORMAL LOW (ref 8.9–10.3)
Chloride: 101 mmol/L (ref 98–111)
Creatinine, Ser: 1.18 mg/dL (ref 0.61–1.24)
GFR, Estimated: >60 mL/min (ref >60)
Glucose, Bld: 157 mg/dL — ABNORMAL HIGH (ref 70–99)
Potassium: 4.7 mmol/L (ref 3.5–5.1)
Sodium: 139 mmol/L (ref 135–145)

## 2020-08-05 LAB — TYPE AND SCREEN
ABO/RH(D): A POS
Antibody Screen: NEGATIVE

## 2020-08-05 LAB — GLUCOSE, CAPILLARY
Glucose-Capillary: 85 mg/dL (ref 70–99)
Glucose-Capillary: 144 mg/dL — ABNORMAL HIGH (ref 70–99)
Glucose-Capillary: 158 mg/dL — ABNORMAL HIGH (ref 70–99)
Glucose-Capillary: 154 mg/dL — ABNORMAL HIGH (ref 70–99)

## 2020-08-05 LAB — HEMOGLOBIN AND HEMATOCRIT, BLOOD
HCT: 43.4 % (ref 39.0–52.0)
Hemoglobin: 13.7 g/dL (ref 13.0–17.0)

## 2020-08-05 SURGERY — NEPHRECTOMY, RADICAL, ROBOT-ASSISTED, LAPAROSCOPIC, ADULT
Anesthesia: General | Site: Abdomen | Laterality: Right

## 2020-08-05 MED ORDER — CHLORHEXIDINE GLUCONATE 0.12 % MT SOLN: 15.0000 mL | Freq: Once | OROMUCOSAL | Status: DC

## 2020-08-05 MED ORDER — HYDROMORPHONE HCL 1 MG/ML IJ SOLN
INTRAMUSCULAR | Status: AC
  Filled 2020-08-05: qty 1

## 2020-08-05 MED ORDER — SODIUM CHLORIDE (PF) 0.9 % IJ SOLN
INTRAMUSCULAR | Status: AC
  Filled 2020-08-05: qty 20

## 2020-08-05 MED ORDER — MIDAZOLAM HCL 2 MG/2ML IJ SOLN
INTRAMUSCULAR | Status: AC
  Filled 2020-08-05: qty 2

## 2020-08-05 MED ORDER — ONDANSETRON HCL 4 MG/2ML IJ SOLN
INTRAMUSCULAR | Status: AC
  Filled 2020-08-05: qty 2

## 2020-08-05 MED ORDER — MIDAZOLAM HCL 5 MG/5ML IJ SOLN
INTRAMUSCULAR | Status: DC | PRN
  Administered 2020-08-05: 2 mg via INTRAVENOUS

## 2020-08-05 MED ORDER — PHENYLEPHRINE 40 MCG/ML (10ML) SYRINGE FOR IV PUSH (FOR BLOOD PRESSURE SUPPORT)
PREFILLED_SYRINGE | INTRAVENOUS | Status: DC | PRN
  Administered 2020-08-05: 80 ug via INTRAVENOUS
  Administered 2020-08-05: 120 ug via INTRAVENOUS

## 2020-08-05 MED ORDER — ONDANSETRON HCL 4 MG/2ML IJ SOLN: 4.0000 mg | INTRAMUSCULAR | Status: DC | PRN

## 2020-08-05 MED ORDER — FENTANYL CITRATE (PF) 100 MCG/2ML IJ SOLN
INTRAMUSCULAR | Status: AC
  Filled 2020-08-05: qty 2

## 2020-08-05 MED ORDER — TRAMADOL HCL 50 MG PO TABS
50.0000 mg | ORAL_TABLET | Freq: Four times a day (QID) | ORAL | 0 refills | Status: DC | PRN
Start: 2020-08-05 — End: 2020-11-03

## 2020-08-05 MED ORDER — ACETAMINOPHEN 10 MG/ML IV SOLN
1000.0000 mg | Freq: Four times a day (QID) | INTRAVENOUS | Status: AC
  Administered 2020-08-05 – 2020-08-06 (×4): 1000 mg via INTRAVENOUS
  Filled 2020-08-05 (×4): qty 100

## 2020-08-05 MED ORDER — ROCURONIUM BROMIDE 10 MG/ML (PF) SYRINGE
PREFILLED_SYRINGE | INTRAVENOUS | Status: AC
  Filled 2020-08-05: qty 10

## 2020-08-05 MED ORDER — LACTATED RINGERS IV SOLN: INTRAVENOUS | Status: DC | PRN

## 2020-08-05 MED ORDER — HYDROMORPHONE HCL 2 MG/ML IJ SOLN
INTRAMUSCULAR | Status: AC
  Filled 2020-08-05: qty 1

## 2020-08-05 MED ORDER — LIDOCAINE HCL (PF) 2 % IJ SOLN
INTRAMUSCULAR | Status: AC
  Filled 2020-08-05: qty 5

## 2020-08-05 MED ORDER — ACETAMINOPHEN 10 MG/ML IV SOLN
INTRAVENOUS | Status: AC
  Filled 2020-08-05: qty 100

## 2020-08-05 MED ORDER — SODIUM CHLORIDE (PF) 0.9 % IJ SOLN
INTRAMUSCULAR | Status: AC
  Filled 2020-08-05: qty 10

## 2020-08-05 MED ORDER — CHLORHEXIDINE GLUCONATE CLOTH 2 % EX PADS
6.0000 | MEDICATED_PAD | Freq: Every day | CUTANEOUS | Status: DC
  Administered 2020-08-05 – 2020-08-06 (×2): 6 via TOPICAL

## 2020-08-05 MED ORDER — PHENYLEPHRINE HCL (PRESSORS) 10 MG/ML IV SOLN
INTRAVENOUS | Status: AC
  Filled 2020-08-05: qty 1

## 2020-08-05 MED ORDER — PROPOFOL 10 MG/ML IV BOLUS
INTRAVENOUS | Status: AC
  Filled 2020-08-05: qty 20

## 2020-08-05 MED ORDER — BUPIVACAINE LIPOSOME 1.3 % IJ SUSP
20.0000 mL | Freq: Once | INTRAMUSCULAR | Status: AC
  Administered 2020-08-05: 20 mL
  Filled 2020-08-05: qty 20

## 2020-08-05 MED ORDER — ACETAMINOPHEN 10 MG/ML IV SOLN
1000.0000 mg | Freq: Once | INTRAVENOUS | Status: DC | PRN
  Administered 2020-08-05: 1000 mg via INTRAVENOUS

## 2020-08-05 MED ORDER — DIPHENHYDRAMINE HCL 50 MG/ML IJ SOLN: 12.5000 mg | Freq: Four times a day (QID) | INTRAMUSCULAR | Status: DC | PRN

## 2020-08-05 MED ORDER — DIPHENHYDRAMINE HCL 12.5 MG/5ML PO ELIX: 12.5000 mg | ORAL_SOLUTION | Freq: Four times a day (QID) | ORAL | Status: DC | PRN

## 2020-08-05 MED ORDER — SODIUM CHLORIDE (PF) 0.9 % IJ SOLN
INTRAMUSCULAR | Status: DC | PRN
  Administered 2020-08-05: 20 mL

## 2020-08-05 MED ORDER — LIDOCAINE 2% (20 MG/ML) 5 ML SYRINGE
INTRAMUSCULAR | Status: DC | PRN
  Administered 2020-08-05: 100 mg via INTRAVENOUS

## 2020-08-05 MED ORDER — HYDROMORPHONE HCL 1 MG/ML IJ SOLN
INTRAMUSCULAR | Status: DC | PRN
Start: 2020-08-05 — End: 2020-08-05
  Administered 2020-08-05 (×3): .4 mg via INTRAVENOUS

## 2020-08-05 MED ORDER — BOOST / RESOURCE BREEZE PO LIQD CUSTOM
1.0000 | Freq: Three times a day (TID) | ORAL | Status: DC
  Administered 2020-08-05 – 2020-08-06 (×4): 1 via ORAL

## 2020-08-05 MED ORDER — OXYCODONE HCL 5 MG PO TABS: 5.0000 mg | ORAL_TABLET | Freq: Once | ORAL | Status: DC | PRN

## 2020-08-05 MED ORDER — PHENYLEPHRINE 40 MCG/ML (10ML) SYRINGE FOR IV PUSH (FOR BLOOD PRESSURE SUPPORT)
PREFILLED_SYRINGE | INTRAVENOUS | Status: AC
  Filled 2020-08-05: qty 10

## 2020-08-05 MED ORDER — LACTATED RINGERS IR SOLN
Status: DC | PRN
  Administered 2020-08-05: 1000 mL

## 2020-08-05 MED ORDER — CEFAZOLIN SODIUM-DEXTROSE 1-4 GM/50ML-% IV SOLN
1.0000 g | Freq: Three times a day (TID) | INTRAVENOUS | Status: AC
  Administered 2020-08-05 – 2020-08-06 (×2): 1 g via INTRAVENOUS
  Filled 2020-08-05 (×2): qty 50

## 2020-08-05 MED ORDER — PHENYLEPHRINE HCL-NACL 10-0.9 MG/250ML-% IV SOLN
INTRAVENOUS | Status: DC | PRN
  Administered 2020-08-05: 50 ug/min via INTRAVENOUS
  Administered 2020-08-05: 35 ug/min via INTRAVENOUS

## 2020-08-05 MED ORDER — CEFAZOLIN SODIUM-DEXTROSE 2-4 GM/100ML-% IV SOLN
2.0000 g | Freq: Once | INTRAVENOUS | Status: AC
  Administered 2020-08-05: 2 g via INTRAVENOUS
  Filled 2020-08-05: qty 100

## 2020-08-05 MED ORDER — POLYMYXIN B-TRIMETHOPRIM 10000-0.1 UNIT/ML-% OP SOLN
1.0000 [drp] | Freq: Three times a day (TID) | OPHTHALMIC | Status: DC
  Administered 2020-08-05: 1 [drp] via OPHTHALMIC
  Filled 2020-08-05: qty 10

## 2020-08-05 MED ORDER — BSS IO SOLN
15.0000 mL | Freq: Once | INTRAOCULAR | Status: AC
  Administered 2020-08-05: 15 mL
  Filled 2020-08-05: qty 15

## 2020-08-05 MED ORDER — PROPOFOL 10 MG/ML IV BOLUS
INTRAVENOUS | Status: DC | PRN
  Administered 2020-08-05: 150 mg via INTRAVENOUS

## 2020-08-05 MED ORDER — STERILE WATER FOR IRRIGATION IR SOLN
Status: DC | PRN
  Administered 2020-08-05: 1000 mL

## 2020-08-05 MED ORDER — OXYCODONE HCL 5 MG/5ML PO SOLN: 5.0000 mg | Freq: Once | ORAL | Status: DC | PRN

## 2020-08-05 MED ORDER — DOCUSATE SODIUM 100 MG PO CAPS
100.0000 mg | ORAL_CAPSULE | Freq: Two times a day (BID) | ORAL | Status: DC
  Administered 2020-08-05 – 2020-08-06 (×3): 100 mg via ORAL
  Filled 2020-08-05 (×3): qty 1

## 2020-08-05 MED ORDER — DEXAMETHASONE SODIUM PHOSPHATE 10 MG/ML IJ SOLN
INTRAMUSCULAR | Status: DC | PRN
  Administered 2020-08-05: 10 mg via INTRAVENOUS

## 2020-08-05 MED ORDER — INSULIN ASPART 100 UNIT/ML ~~LOC~~ SOLN
0.0000 [IU] | SUBCUTANEOUS | Status: DC
  Administered 2020-08-05 – 2020-08-06 (×3): 3 [IU] via SUBCUTANEOUS
  Administered 2020-08-06: 15 [IU] via SUBCUTANEOUS
  Administered 2020-08-06 (×2): 3 [IU] via SUBCUTANEOUS
  Administered 2020-08-06: 2 [IU] via SUBCUTANEOUS
  Administered 2020-08-07 (×2): 3 [IU] via SUBCUTANEOUS
  Administered 2020-08-07: 5 [IU] via SUBCUTANEOUS

## 2020-08-05 MED ORDER — EPHEDRINE 5 MG/ML INJ
INTRAVENOUS | Status: AC
  Filled 2020-08-05: qty 10

## 2020-08-05 MED ORDER — ONDANSETRON HCL 4 MG/2ML IJ SOLN: 4.0000 mg | Freq: Once | INTRAMUSCULAR | Status: DC | PRN

## 2020-08-05 MED ORDER — LACTATED RINGERS IV SOLN: INTRAVENOUS | Status: DC

## 2020-08-05 MED ORDER — SODIUM CHLORIDE 0.9 % IV SOLN: INTRAVENOUS | Status: DC

## 2020-08-05 MED ORDER — LEVOTHYROXINE SODIUM 50 MCG PO TABS
75.0000 ug | ORAL_TABLET | Freq: Every day | ORAL | Status: DC
  Administered 2020-08-06 – 2020-08-07 (×2): 75 ug via ORAL
  Filled 2020-08-05 (×2): qty 1

## 2020-08-05 MED ORDER — FENTANYL CITRATE (PF) 100 MCG/2ML IJ SOLN
INTRAMUSCULAR | Status: DC | PRN
  Administered 2020-08-05 (×5): 50 ug via INTRAVENOUS

## 2020-08-05 MED ORDER — FENTANYL CITRATE (PF) 100 MCG/2ML IJ SOLN
25.0000 ug | INTRAMUSCULAR | Status: DC | PRN
  Administered 2020-08-05 (×2): 50 ug via INTRAVENOUS

## 2020-08-05 MED ORDER — BUPIVACAINE-EPINEPHRINE (PF) 0.25% -1:200000 IJ SOLN
INTRAMUSCULAR | Status: AC
  Filled 2020-08-05: qty 30

## 2020-08-05 MED ORDER — SUGAMMADEX SODIUM 200 MG/2ML IV SOLN
INTRAVENOUS | Status: DC | PRN
  Administered 2020-08-05: 200 mg via INTRAVENOUS

## 2020-08-05 MED ORDER — EZETIMIBE-SIMVASTATIN 10-20 MG PO TABS
1.0000 | ORAL_TABLET | Freq: Every day | ORAL | Status: DC
  Administered 2020-08-05 – 2020-08-06 (×2): 1 via ORAL
  Filled 2020-08-05 (×2): qty 1

## 2020-08-05 MED ORDER — ENSURE ENLIVE PO LIQD
237.0000 mL | Freq: Two times a day (BID) | ORAL | Status: DC
  Administered 2020-08-06: 237 mL via ORAL

## 2020-08-05 MED ORDER — FENTANYL CITRATE (PF) 250 MCG/5ML IJ SOLN
INTRAMUSCULAR | Status: AC
  Filled 2020-08-05: qty 5

## 2020-08-05 MED ORDER — DEXAMETHASONE SODIUM PHOSPHATE 10 MG/ML IJ SOLN
INTRAMUSCULAR | Status: AC
  Filled 2020-08-05: qty 1

## 2020-08-05 MED ORDER — ROCURONIUM BROMIDE 10 MG/ML (PF) SYRINGE
PREFILLED_SYRINGE | INTRAVENOUS | Status: DC | PRN
  Administered 2020-08-05: 70 mg via INTRAVENOUS
  Administered 2020-08-05: 20 mg via INTRAVENOUS
  Administered 2020-08-05: 30 mg via INTRAVENOUS
  Administered 2020-08-05: 20 mg via INTRAVENOUS
  Administered 2020-08-05: 10 mg via INTRAVENOUS

## 2020-08-05 MED ORDER — HYDROMORPHONE HCL 1 MG/ML IJ SOLN
0.5000 mg | INTRAMUSCULAR | Status: DC | PRN
  Administered 2020-08-05: 1 mg via INTRAVENOUS
  Filled 2020-08-05: qty 1

## 2020-08-05 MED ORDER — HYDROMORPHONE HCL 1 MG/ML IJ SOLN
0.2500 mg | INTRAMUSCULAR | Status: DC | PRN
  Administered 2020-08-05 (×4): 0.5 mg via INTRAVENOUS

## 2020-08-05 MED ORDER — KETOROLAC TROMETHAMINE 0.5 % OP SOLN
1.0000 [drp] | Freq: Three times a day (TID) | OPHTHALMIC | Status: DC | PRN
  Administered 2020-08-05: 1 [drp] via OPHTHALMIC
  Filled 2020-08-05: qty 5

## 2020-08-05 MED ORDER — ONDANSETRON HCL 4 MG/2ML IJ SOLN
INTRAMUSCULAR | Status: DC | PRN
  Administered 2020-08-05: 4 mg via INTRAVENOUS

## 2020-08-05 MED ORDER — EPHEDRINE SULFATE-NACL 50-0.9 MG/10ML-% IV SOSY
PREFILLED_SYRINGE | INTRAVENOUS | Status: DC | PRN
  Administered 2020-08-05: 10 mg via INTRAVENOUS

## 2020-08-05 MED ORDER — ORAL CARE MOUTH RINSE: 15.0000 mL | Freq: Once | OROMUCOSAL | Status: DC

## 2020-08-05 MED FILL — traMADol HCL 50 MG TABS: 50 | 3 days supply | Qty: 20 | Fill #0

## 2020-08-05 SURGICAL SUPPLY — 59 items
APPLICATOR SURGIFLO ENDO (HEMOSTASIS) ×3 IMPLANT
BAG LAPAROSCOPIC 12 15 PORT 16 (BASKET) ×2 IMPLANT
BAG RETRIEVAL 12/15 (BASKET) ×3
CHLORAPREP W/TINT 26 (MISCELLANEOUS) ×3 IMPLANT
CLIP VESOLOCK LG 6/CT PURPLE (CLIP) ×6 IMPLANT
CLIP VESOLOCK MED LG 6/CT (CLIP) ×9 IMPLANT
COVER SURGICAL LIGHT HANDLE (MISCELLANEOUS) ×3 IMPLANT
COVER TIP SHEARS 8 DVNC (MISCELLANEOUS) ×2 IMPLANT
COVER TIP SHEARS 8MM DA VINCI (MISCELLANEOUS) ×3
COVER WAND RF STERILE (DRAPES) IMPLANT
CUTTER ECHEON FLEX ENDO 45 340 (ENDOMECHANICALS) ×3 IMPLANT
DECANTER SPIKE VIAL GLASS SM (MISCELLANEOUS) ×3 IMPLANT
DERMABOND ADVANCED (GAUZE/BANDAGES/DRESSINGS) ×1
DERMABOND ADVANCED .7 DNX12 (GAUZE/BANDAGES/DRESSINGS) ×2 IMPLANT
DRAIN CHANNEL 15F RND FF 3/16 (WOUND CARE) IMPLANT
DRAPE ARM DVNC X/XI (DISPOSABLE) ×8 IMPLANT
DRAPE COLUMN DVNC XI (DISPOSABLE) ×2 IMPLANT
DRAPE DA VINCI XI ARM (DISPOSABLE) ×12
DRAPE DA VINCI XI COLUMN (DISPOSABLE) ×3
DRAPE INCISE IOBAN 66X45 STRL (DRAPES) ×3 IMPLANT
DRAPE SHEET LG 3/4 BI-LAMINATE (DRAPES) ×3 IMPLANT
ELECT REM PT RETURN 15FT ADLT (MISCELLANEOUS) ×3 IMPLANT
EVACUATOR SILICONE 100CC (DRAIN) ×3 IMPLANT
GLOVE BIO SURGEON STRL SZ 6.5 (GLOVE) ×3 IMPLANT
GLOVE BIOGEL M STRL SZ7.5 (GLOVE) ×6 IMPLANT
GOWN STRL REUS W/TWL LRG LVL3 (GOWN DISPOSABLE) ×9 IMPLANT
HEMOSTAT SURGICEL 4X8 (HEMOSTASIS) ×3 IMPLANT
IRRIG SUCT STRYKERFLOW 2 WTIP (MISCELLANEOUS)
IRRIGATION SUCT STRKRFLW 2 WTP (MISCELLANEOUS) IMPLANT
KIT BASIN OR (CUSTOM PROCEDURE TRAY) ×3 IMPLANT
KIT TURNOVER KIT A (KITS) IMPLANT
NS IRRIG 1000ML POUR BTL (IV SOLUTION) ×3 IMPLANT
PENCIL SMOKE EVACUATOR (MISCELLANEOUS) IMPLANT
POUCH SPECIMEN RETRIEVAL 10MM (ENDOMECHANICALS) ×3 IMPLANT
PROTECTOR NERVE ULNAR (MISCELLANEOUS) ×6 IMPLANT
SEAL CANN UNIV 5-8 DVNC XI (MISCELLANEOUS) ×8 IMPLANT
SEAL XI 5MM-8MM UNIVERSAL (MISCELLANEOUS) ×12
SET TUBE SMOKE EVAC HIGH FLOW (TUBING) ×3 IMPLANT
SOLUTION ELECTROLUBE (MISCELLANEOUS) ×3 IMPLANT
STAPLE RELOAD 45 WHT (STAPLE) ×6 IMPLANT
STAPLE RELOAD 45MM WHITE (STAPLE) ×9
SURGIFLO W/THROMBIN 8M KIT (HEMOSTASIS) ×3 IMPLANT
SUT ETHILON 3 0 PS 1 (SUTURE) IMPLANT
SUT MNCRL AB 4-0 PS2 18 (SUTURE) ×6 IMPLANT
SUT PDS AB 0 CTX 36 PDP370T (SUTURE) ×6 IMPLANT
SUT V-LOC BARB 180 2/0GR6 GS22 (SUTURE) ×3
SUT VIC AB 0 CT1 27 (SUTURE) ×3
SUT VIC AB 0 CT1 27XBRD ANTBC (SUTURE) ×2 IMPLANT
SUT VICRYL 0 UR6 27IN ABS (SUTURE) ×3 IMPLANT
SUT VLOC BARB 180 ABS3/0GR12 (SUTURE) ×3
SUTURE V-LC BRB 180 2/0GR6GS22 (SUTURE) ×2 IMPLANT
SUTURE VLOC BRB 180 ABS3/0GR12 (SUTURE) ×2 IMPLANT
TOWEL OR 17X26 10 PK STRL BLUE (TOWEL DISPOSABLE) ×3 IMPLANT
TOWEL OR NON WOVEN STRL DISP B (DISPOSABLE) ×3 IMPLANT
TRAY FOLEY MTR SLVR 16FR STAT (SET/KITS/TRAYS/PACK) ×3 IMPLANT
TRAY LAPAROSCOPIC (CUSTOM PROCEDURE TRAY) ×3 IMPLANT
TROCAR UNIVERSAL OPT 12M 100M (ENDOMECHANICALS) IMPLANT
TROCAR XCEL 12X100 BLDLESS (ENDOMECHANICALS) ×3 IMPLANT
WATER STERILE IRR 1000ML POUR (IV SOLUTION) ×6 IMPLANT

## 2020-08-05 NOTE — Interval H&P Note (Signed)
History and Physical Interval Note:  08/05/2020 6:47 AM  [name]  has presented today for surgery, with the diagnosis of RIGHT RENAL CELL CARCINOMA.  The various methods of treatment have been discussed with the patient and family. After consideration of risks, benefits and other options for treatment, the patient has consented to  Procedure(s): XI ROBOTIC [place] (Right) POSSIBLE LYMPH NODE DISSECTION as a surgical intervention.  The patient's history has been reviewed, patient examined, no change in status, stable for surgery.  I have reviewed the patient's chart and labs.  Questions were answered to the patient's satisfaction.     [employer]

## 2020-08-05 NOTE — Progress Notes (Signed)
Patient ID: [name], male   DOB: 1956/06/03, 64 y.o.   MRN: [id number]  Post-op note  Subjective: The patient is doing well.  No complaints.  Objective: Vital signs in last 24 hours: Temp:  [97.6 F (36.4 C)-98 F (36.7 C)] 97.7 F (36.5 C) (12/02 1230) Pulse Rate:  [86-95] 87 (12/02 1400) Resp:  [11-25] 13 (12/02 1400) BP: (98-159)/(63-85) 113/73 (12/02 1400) SpO2:  [99 %-100 %] 100 % (12/02 1400) Weight:  [86.2 kg] 86.2 kg (12/02 0549)  Intake/Output from previous day: No intake/output data recorded. Intake/Output this shift: Total I/O In: 2800 [I.V.:2700; IV Piggyback:100] Out: 325 [Urine:225; Blood:100]  Physical Exam:  General: Alert and oriented. Abdomen: Soft, Nondistended. Incisions: Clean and dry.  Lab Results: Recent Labs    08/05/20 1219  HGB 13.7  HCT 43.4    Assessment/Plan: POD#0   1) Continue to monitor, ambulate and IS tonight, will hold anticoagulation tonight with plans to start Lovenox in AM, SSI   [name]. MD   LOS: 0 days   [name] 08/05/2020, 2:39 PM

## 2020-08-05 NOTE — Anesthesia Procedure Notes (Signed)
Procedure Name: Intubation Date/Time: 08/05/2020 7:39 AM Performed by: Maxwell Caul, CRNA Pre-anesthesia Checklist: Patient identified, Emergency Drugs available, Suction available and Patient being monitored Patient Re-evaluated:Patient Re-evaluated prior to induction Oxygen Delivery Method: Circle system utilized Preoxygenation: Pre-oxygenation with 100% oxygen Induction Type: IV induction Ventilation: Mask ventilation without difficulty Laryngoscope Size: Glidescope and 4 Grade View: Grade I Tube type: Oral Tube size: 7.0 mm Number of attempts: 1 Airway Equipment and Method: Stylet Placement Confirmation: ETT inserted through vocal cords under direct vision,  positive ETCO2 and breath sounds checked- equal and bilateral Secured at: 22 cm Tube secured with: Tape Dental Injury: Teeth and Oropharynx as per pre-operative assessment  Difficulty Due To: Difficulty was anticipated Comments: Due to medical history and prior documented intubations, Glidescope was utilized electively. DL X 1 with #4 and Grade 1 view with 7.0 ETT placed with ease.

## 2020-08-05 NOTE — Transfer of Care (Signed)
Immediate Anesthesia Transfer of Care Note  Patient: [name]  Procedure(s) Performed: XI ROBOTIC ASSISTED LAPAROSCOPIC RADICAL NEPHRECTOMY/PARTIAL ADRENALECTOMY (Right Abdomen) LYMPH NODE DISSECTION (Right )  Patient Location: PACU  Anesthesia Type:General  Level of Consciousness: awake, alert  and oriented  Airway & Oxygen Therapy: Patient Spontanous Breathing and Patient connected to face mask oxygen  Post-op Assessment: Report given to RN and Post -op Vital signs reviewed and stable  Post vital signs: Reviewed and stable  Last Vitals:  Vitals Value Taken Time  BP 139/75 08/05/20 1107  Temp    Pulse 89 08/05/20 1109  Resp 14 08/05/20 1109  SpO2 100 % 08/05/20 1109  Vitals shown include unvalidated device data.  Last Pain:  Vitals:   08/05/20 0559  TempSrc: Oral  PainSc:          Complications: No complications documented.

## 2020-08-05 NOTE — Progress Notes (Addendum)
Received report at 1512 from PACU RN and pt arrived at 1540, noted. Pt is alert and oriented times 4 and stable.

## 2020-08-05 NOTE — Anesthesia Postprocedure Evaluation (Signed)
Anesthesia Post Note  Patient: [name]  Procedure(s) Performed: XI ROBOTIC ASSISTED LAPAROSCOPIC RADICAL NEPHRECTOMY/PARTIAL ADRENALECTOMY (Right Abdomen) LYMPH NODE DISSECTION (Right )     Patient location during evaluation: PACU Anesthesia Type: General Level of consciousness: awake and alert Pain management: pain level controlled Vital Signs Assessment: post-procedure vital signs reviewed and stable Respiratory status: spontaneous breathing, nonlabored ventilation, respiratory function stable and patient connected to nasal cannula oxygen Cardiovascular status: blood pressure returned to baseline and stable Postop Assessment: no apparent nausea or vomiting Anesthetic complications: yes (L corneal abrasion. order set initiated)   No complications documented.  Last Vitals:  Vitals:   08/05/20 1400 08/05/20 1500  BP: 113/73 128/77  Pulse: 87 91  Resp: 13 14  Temp:  (!) 36.4 C  SpO2: 100% 100%    Last Pain:  Vitals:   08/05/20 1500  TempSrc:   PainSc: 4                  Maniyah Moller S

## 2020-08-05 NOTE — Op Note (Signed)
Preoperative diagnosis: Right renal cell carcinoma  Postoperative diagnosis: Right renal cell carcinoma  Procedure:  1. Right robotic-assisted laparoscopic radical nephrectomy 2. Retroperitoneal lymph node dissection  Surgeon: Pryor Curia. M.D.  Assistant(s): Debbrah Alar, PA-C  An assistant was required for this surgical procedure.  The duties of the assistant included but were not limited to suctioning, passing suture, camera manipulation, retraction. This procedure would not be able to be performed without an Environmental consultant.  Resident: Dr. Ermelinda Das  Anesthesia: General  Complications: None  EBL: 100 mL  IVF:  2000 mL crystalloid  Specimens: 1. Right kidney and partial adrenal gland 2. Paracaval lymph nodes  Disposition of specimens: Pathology  Indication:  [name] is a 64 y.o. year old patient with a right renal mass that was biopsied and was proven to be renal cell carcinoma.  After a thorough review of the management options for their renal mass, they elected to proceed with surgical treatment and the above procedure.  We have discussed the potential benefits and risks of the procedure, side effects of the proposed treatment, the likelihood of the patient achieving the goals of the procedure, and any potential problems that might occur during the procedure or recuperation. Informed consent has been obtained.  Description of procedure:  The patient was taken to the operating room and a general anesthetic was administered. The patient was given preoperative antibiotics, placed in the right modified flank position with care to pad all potential pressure points, and prepped and draped in the usual sterile fashion. Next a preoperative timeout was performed.  A site was selected on in the midline for placement of the assistant port. This was placed using a standard open Hassan technique which allowed entry into the peritoneal cavity under direct vision and  without difficulty. A 12 mm port was placed and a pneumoperitoneum established. The camera was then used to inspect the abdomen and there was no evidence of any intra-abdominal injuries or other abnormalities. The remaining abdominal ports were then placed. 8 mm robotic ports were placed in the right upper quadrant, right lower quadrant, and far right lateral abdominal wall. An 8 mm port was placed for the camera site just right of the umbilicus. All ports were placed under direct vision without difficulty. The surgical cart was then docked.   Utilizing the cautery scissors, the white line of Toldt was incised allowing the colon to be mobilized medially and the plane between the mesocolon and the anterior layer of Gerota's fascia to be developed and the kidney to be exposed.  The ureter and gonadal vein were identified inferiorly and the ureter was lifted anteriorly off the psoas muscle.  Dissection proceeded superiorly along the gonadal vein until the lower most renal vein was identified.  The renal hilum was then carefully isolated with a combination of blunt and sharp dissection allowing the renal arterial and venous structures to be separated.  Ultimately, there were two renal veins as identified on imaging and there was a branching renal artery.  The lower pole renal vein was ligated and divided with a 45 mm Echelon stapler.  The renal artery branching were then each ligated with 10 mm Weck clips and divided.  Finally, the upper pole renal vein was stapled with the 45 mm Echelon stapler.  The patient's tumor was an infiltrative upper pole tumor.  In order to have a sufficient margin, it was felt that partial adrenal gland removal would be necessary.  Therefore, the adrenal gland dissected  from the perirenal fat where it could safely be separated with an adequate margin and once the kidney was clearly separated enough to allow safe division of the adrenal gland, the medial aspect of the adrenal gland was  stapled with the 45 mm Echelon stapler allowing separation of the upper pole.  The remaining hepatorenal ligaments were then divided along with the lateral attachments to the kidney outside of Gerota's fascia.  The ureter was then divided between 10 mm clips and the specimen was free.  The paracaval lymph nodes were then examined.  There had been one suspicious lymph node anterior to the IVC that was removed en bloc with the specimen.  As a separate specimen, I dissected the paracaval lymph nodes off the vena cava from the renal hilum down to the bifurcation.  Small vessels and lymphatics were ligated with bipolar energy.  This tissue, once separated, was removed and sent for permanent pathologic evaluation.  The kidney specimen was then placed in a 10 mm retrieval bag.  Due to his need to be anticoagulated chronically, Surgiflo was placed over the divided portion of the adrenal gland and a piece of surgicel was placed over this.     The robotic platform was then undocked. All other laparoscopic/robotic ports were removed under direct vision and the pneumoperitoneum let down with inspection of the operative field performed and hemostasis again confirmed.  The midline incision was extended slightly inferiorly and the specimen was removed intact.  This fascial opening was closed with two running 0-PDS sutures.  All incision sites were then injected with local anesthetic and reapproximated at the skin level with 4-0 monocryl subcuticular closures.  Dermabond was applied to the skin.  The patient tolerated the procedure well and without complications.  The patient was able to be extubated and transferred to the recovery unit in satisfactory condition.  Pryor Curia MD

## 2020-08-06 ENCOUNTER — Encounter (HOSPITAL_COMMUNITY): Payer: Self-pay | Admitting: Urology

## 2020-08-06 LAB — BASIC METABOLIC PANEL
Anion gap: 17 — ABNORMAL HIGH (ref 5–15)
BUN: 29 mg/dL — ABNORMAL HIGH (ref 8–23)
CO2: 17 mmol/L — ABNORMAL LOW (ref 22–32)
Calcium: 8.7 mg/dL — ABNORMAL LOW (ref 8.9–10.3)
Chloride: 101 mmol/L (ref 98–111)
Creatinine, Ser: 1.65 mg/dL — ABNORMAL HIGH (ref 0.61–1.24)
GFR, Estimated: 46 mL/min — ABNORMAL LOW (ref >60)
Glucose, Bld: 182 mg/dL — ABNORMAL HIGH (ref 70–99)
Potassium: 4.8 mmol/L (ref 3.5–5.1)
Sodium: 135 mmol/L (ref 135–145)

## 2020-08-06 LAB — GLUCOSE, CAPILLARY
Glucose-Capillary: 124 mg/dL — ABNORMAL HIGH (ref 70–99)
Glucose-Capillary: 167 mg/dL — ABNORMAL HIGH (ref 70–99)
Glucose-Capillary: 187 mg/dL — ABNORMAL HIGH (ref 70–99)
Glucose-Capillary: 163 mg/dL — ABNORMAL HIGH (ref 70–99)
Glucose-Capillary: 352 mg/dL — ABNORMAL HIGH (ref 70–99)
Glucose-Capillary: 189 mg/dL — ABNORMAL HIGH (ref 70–99)

## 2020-08-06 LAB — HEMOGLOBIN AND HEMATOCRIT, BLOOD
HCT: 46.5 % (ref 39.0–52.0)
Hemoglobin: 14.5 g/dL (ref 13.0–17.0)

## 2020-08-06 MED ORDER — ENOXAPARIN SODIUM 80 MG/0.8ML ~~LOC~~ SOLN
80.0000 mg | Freq: Two times a day (BID) | SUBCUTANEOUS | Status: DC
  Administered 2020-08-06 – 2020-08-07 (×3): 80 mg via SUBCUTANEOUS
  Filled 2020-08-06 (×3): qty 0.8

## 2020-08-06 MED ORDER — ENSURE ENLIVE PO LIQD: 237.0000 mL | ORAL | Status: DC

## 2020-08-06 MED ORDER — BISACODYL 10 MG RE SUPP
10.0000 mg | Freq: Once | RECTAL | Status: AC
  Administered 2020-08-06: 10 mg via RECTAL
  Filled 2020-08-06: qty 1

## 2020-08-06 MED ORDER — TRAMADOL HCL 50 MG PO TABS: 50.0000 mg | ORAL_TABLET | Freq: Four times a day (QID) | ORAL | Status: DC | PRN

## 2020-08-06 MED ORDER — ACETAMINOPHEN 500 MG PO TABS: 500.0000 mg | ORAL_TABLET | Freq: Four times a day (QID) | ORAL | Status: DC | PRN

## 2020-08-06 NOTE — Progress Notes (Addendum)
Initial Nutrition Assessment  DOCUMENTATION CODES:   Not applicable  INTERVENTION:  - continue Boost Breeze TID, each supplement provides 250 kcal and 9 grams of protein. - will decrease Ensure Enlive from BID to once/day, each supplement provides 350 kcal and 20 grams of protein. - complete NFPE at follow-up.    NUTRITION DIAGNOSIS:   Increased nutrient needs related to post-op healing, cancer and cancer related treatments as evidenced by estimated needs.  GOAL:   Patient will meet greater than or equal to 90% of their needs  MONITOR:   PO intake, Supplement acceptance, Labs, Weight trends  REASON FOR ASSESSMENT:   Malnutrition Screening Tool  ASSESSMENT:   64 year-old male with medical history of DM, HLD, hypothyroidism, and R renal cell carcinoma. Patient presented to the hospital for lap radical nephrectomy.  Diet changed from CLD to NPO at midnight and advanced to Carb Modified today at 1440. No intakes documented.  Unable to see patient in person today.  He is POD #1 R radical nephrectomy and retroperitoneal lymph node dissection.   MD note states plan for d/c tomorrow (12/4) AM if he tolerates diet for dinner tonight.   Weight yesterday was documented as 190 lb, which appears to be a stated weight. Weight on 10/2 was 192 lb which would indicate 2 lb weight loss in 2 months.   Prior to 06/05/20, the most recently documented weight was on 10/26/16 when he weighed 205 lb.  Boost Breeze ordered TID and he has been accepting this supplement 100% of the time offered. Ensure ordered BID and he has accepted this supplement 50% of the time offered.    Labs reviewed; CBGs: 124, 167, 187, 163 mg/dl, BUN: 29 mg/dl, creatinine: 1.65 mg/dl, Ca: 8.7 mg/dl, GFR: 46 ml/min. Medications reviewed; 100 mg colace BID, sliding scale novolog, 75 mcg oral synthroid/day. IVF; NS @ 75 ml/hr.     NUTRITION - FOCUSED PHYSICAL EXAM:  unable to complete at this time.   Diet Order:    Diet Order            Diet Carb Modified Fluid consistency: Thin; Room service appropriate? Yes  Diet effective now                 EDUCATION NEEDS:   No education needs have been identified at this time  Skin:  Skin Assessment: Skin Integrity Issues: Skin Integrity Issues:: Incisions Incisions: R lower back (11/29); abdomen (12/2)  Last BM:  PTA/unknown  Height:   Ht Readings from Last 1 Encounters:  08/05/20 6\' 3"  (1.905 m)    Weight:   Wt Readings from Last 1 Encounters:  08/05/20 86.2 kg    Estimated Nutritional Needs:  Kcal:  2200-2400 kcal Protein:  110-125 grams Fluid:  >/= 2.4 L/day      Jarome Matin, MS, RD, LDN, CNSC Inpatient Clinical Dietitian RD pager # available in [facility]  After hours/weekend pager # available in [facility]

## 2020-08-06 NOTE — TOC Progression Note (Signed)
Transition of Care [facility]) - Progression Note    Patient Details  Name: [name] MRN: [id number] Date of Birth: 03-Nov-1955  [facility]) CM/SW Contact  Purcell Mouton, RN Phone Number: 08/06/2020, 3:06 PM  Clinical Narrative:    TOC will follow for discharge needs.    Expected Discharge Plan: Home/Self Care Barriers to Discharge: No Barriers Identified  Expected Discharge Plan and Services Expected Discharge Plan: Home/Self Care       Living arrangements for the past 2 months: Single Family Home                                       Social Determinants of Health (SDOH) Interventions    Readmission Risk Interventions No flowsheet data found.

## 2020-08-06 NOTE — Consult Note (Signed)
   [facility] Inpatient Consult   08/06/2020  [name] 02/27/1956 [id number]    [employer] [ACO] Patient: [name] plan  Patient is assigned with [place] [facility] RN Care Coordinator for the [place] for post hospital   disease management and community resource support as a benefit of the insurance plan.     Plan: Continue to follow for progress and disposition needs with inpatient [facility] team as appropriate.Patient will be followed by [place] Coordinator.   For additional questions or referrals please contact:   Natividad Brood, RN BSN [place] [facility] Liaison  [phone] business mobile phone Toll free office [phone]  Fax number: [phone] [email or web address]

## 2020-08-06 NOTE — Progress Notes (Signed)
Patient ID: [name], male   DOB: [birth year], 64 y.o.   MRN: [id number]   Doing well.  Voided and BM this afternoon.  Will advance diet tonight.  Plan for d/c in AM if tolerating diet.  He can resume Xarelto at home tomorrow if Hgb stable on therapeutic Lovenox overnight.  Labs pending for AM.

## 2020-08-06 NOTE — Progress Notes (Signed)
Patient took a walk around the unit this am and was back to bed; he was given his morning meds and had 5 episode of green projective vomiting. I asked the patient if he wanted medication nausea and vomiting he declined it and stated he felt better. No distress noted will continue present plan of care.

## 2020-08-06 NOTE — Discharge Instructions (Signed)
1.  Activity:  You are encouraged to ambulate frequently (about every hour during waking hours) to help prevent blood clots from forming in your legs or lungs.  However, you should not engage in any heavy lifting (> 10-15 lbs), strenuous activity, or straining. 2. Diet: You should advance your diet as instructed by your physician.  It will be normal to have some bloating, nausea, and abdominal discomfort intermittently. 3. Prescriptions:  You will be provided a prescription for pain medication to take as needed.  If your pain is not severe enough to require the prescription pain medication, you may take extra strength Tylenol instead which will have less side effects.  You should also take a prescribed stool softener to avoid straining with bowel movements as the prescription pain medication may constipate you. 4. Incisions: You may remove your dressing bandages 48 hours after surgery if not removed in the hospital.  You will either have some small staples or special tissue glue at each of the incision sites. Once the bandages are removed (if present), the incisions may stay open to air.  You may start showering (but not soaking or bathing in water) the 2nd day after surgery and the incisions simply need to be patted dry after the shower.  No additional care is needed. 5. What to call us about: You should call the office [phone]) if you develop fever > 101 or develop persistent vomiting.  RESTART XARELTO Saturday EVENING UNLESS INSTRUCTED OTHERWISE.  OK TO STOP LOVENOX ONCE TAKING XARELTO AGAIN.   You may resume aspirin, advil, aleve, vitamins, and supplements 7 days after surgery.  Resume Eliquis per Dr. Lynne Logan instructions.

## 2020-08-06 NOTE — Progress Notes (Addendum)
1 Day Post-Op Subjective: Feeling well this morning Pain controlled Reports nausea, emesis early this morning which has since resolved Good clear yellow UOP  Objective: Vital signs in last 24 hours: Temp:  [97.5 F (36.4 C)-98.6 F (37 C)] 98.6 F (37 C) (12/03 0434) Pulse Rate:  [86-98] 91 (12/03 0434) Resp:  [11-25] 18 (12/03 0434) BP: (98-176)/(59-82) 119/59 (12/03 0434) SpO2:  [96 %-100 %] 96 % (12/03 0434) Weight:  [86.2 kg] 86.2 kg (12/02 1606)  Intake/Output from previous day: 12/02 0701 - 12/03 0700 In: 4656 [P.O.:837; I.V.:3700; IV Piggyback:400] Out: 1525 [Urine:1425; Blood:100] Intake/Output this shift: No intake/output data recorded.  Physical Exam:  General: Alert and oriented CV: RRR Lungs: Clear Abdomen: Soft, ND Incisions: c/d/i GU: foley in place draining clear yellow urine Ext: NT, No erythema  Lab Results: Recent Labs    08/05/20 1219 08/06/20 0458  HGB 13.7 14.5  HCT 43.4 46.5   BMET Recent Labs    08/05/20 1219 08/06/20 0458  NA 139 135  K 4.7 4.8  CL 101 101  CO2 22 17*  GLUCOSE 157* 182*  BUN 25* 29*  CREATININE 1.18 1.65*  CALCIUM 8.6* 8.7*     Studies/Results: No results found.  Assessment/Plan: POD#1 s/p right radical nephrectomy and retroperitoneal lymph node dissection. Recovering appropriately  - pain control as needed - will cont mIVF given emesis this am - CLD - bowel regimen - encourage ambulation - periop antibiotics complete - plan to start lovenox this morning given stable Hgb - SSI -remove foley this am with TOV   LOS: 1 day   Alysen Demzik 08/06/2020, 7:43 AM  I have seen and examined the patient and agree with the above assessment and plan.

## 2020-08-06 NOTE — Plan of Care (Signed)

## 2020-08-07 LAB — GLUCOSE, CAPILLARY
Glucose-Capillary: 203 mg/dL — ABNORMAL HIGH (ref 70–99)
Glucose-Capillary: 193 mg/dL — ABNORMAL HIGH (ref 70–99)
Glucose-Capillary: 164 mg/dL — ABNORMAL HIGH (ref 70–99)

## 2020-08-07 LAB — BASIC METABOLIC PANEL
Anion gap: 13 (ref 5–15)
BUN: 20 mg/dL (ref 8–23)
CO2: 20 mmol/L — ABNORMAL LOW (ref 22–32)
Calcium: 8.4 mg/dL — ABNORMAL LOW (ref 8.9–10.3)
Chloride: 101 mmol/L (ref 98–111)
Creatinine, Ser: 1.4 mg/dL — ABNORMAL HIGH (ref 0.61–1.24)
GFR, Estimated: 55 mL/min — ABNORMAL LOW (ref >60)
Glucose, Bld: 198 mg/dL — ABNORMAL HIGH (ref 70–99)
Potassium: 4.1 mmol/L (ref 3.5–5.1)
Sodium: 134 mmol/L — ABNORMAL LOW (ref 135–145)

## 2020-08-07 LAB — HEMOGLOBIN AND HEMATOCRIT, BLOOD
HCT: 41.6 % (ref 39.0–52.0)
Hemoglobin: 13.1 g/dL (ref 13.0–17.0)

## 2020-08-07 NOTE — Plan of Care (Signed)
  Problem: Education: Goal: Ability to describe self-care measures that may prevent or decrease complications (Diabetes Survival Skills Education) will improve Outcome: Progressing Goal: Individualized Educational Video(s) Outcome: Progressing   Problem: Education: Goal: Individualized Educational Video(s) Outcome: Progressing   Problem: Coping: Goal: Ability to adjust to condition or change in health will improve Outcome: Progressing   Problem: Fluid Volume: Goal: Ability to maintain a balanced intake and output will improve Outcome: Progressing   Problem: Health Behavior/Discharge Planning: Goal: Ability to identify and utilize available resources and services will improve Outcome: Progressing Goal: Ability to manage health-related needs will improve Outcome: Progressing   Problem: Metabolic: Goal: Ability to maintain appropriate glucose levels will improve Outcome: Progressing   Problem: Nutritional: Goal: Maintenance of adequate nutrition will improve Outcome: Progressing Goal: Progress toward achieving an optimal weight will improve Outcome: Progressing   Problem: Skin Integrity: Goal: Risk for impaired skin integrity will decrease Outcome: Progressing

## 2020-08-07 NOTE — Progress Notes (Signed)
Pt is discahrged at this time. Both PIV removed with cath intact. No c/o pain of discomfort. Ambulated from the bed to wheelchair to go home with wife, noted. Pt is alert and oriented times 4 and stable.

## 2020-08-07 NOTE — Discharge Summary (Signed)
Date of admission: 08/05/2020  Date of discharge: 08/07/2020  Admission diagnosis: Right renal mass  Discharge diagnosis: Right renal mass  History and Physical: For full details, please see admission history and physical. Briefly, [name] is a 64 y.o. gentleman with right renal mass.  After discussing management/treatment options, he elected to proceed with surgical treatment.  Hospital Course: CALVYN KURTZMAN was taken to the operating room on 08/05/2020 and underwent a robotic assisted laparoscopic radical right nephrectomy, partial adrenalectomy and retroperitoneal lymph node dissection. He tolerated this procedure well and without complications. Postoperatively, he was able to be transferred to a regular hospital room following recovery from anesthesia.  He was able to begin ambulating the night of surgery. He remained hemodynamically stable overnight.  He had excellent urine output with appropriately minimal output from his pelvic drain. Foley catheter was removed with successful TOV on POD#1. He was restarted on bridging lovenox on POD#1 with stable Hgb. He was transitioned to oral pain medication, tolerated a clear liquid diet, and had met all discharge criteria and was able to be discharged home later on POD#2 with plans to restart Xarelto at home upon discharge.  Laboratory values:  Recent Labs    08/05/20 1219 08/06/20 0458 08/07/20 0608  HGB 13.7 14.5 13.1  HCT 43.4 46.5 41.6    Disposition: Home  Discharge instruction: He was instructed to be ambulatory but to refrain from heavy lifting, strenuous activity, or driving.   Discharge medications:   Allergies as of 08/07/2020   No Known Allergies     Medication List    STOP taking these medications   MULTI FOR HIM 50+ PO     TAKE these medications   Basaglar KwikPen 100 UNIT/ML Inject 22 Units into the skin at bedtime.   ezetimibe-simvastatin 10-20 MG tablet Commonly known as: VYTORIN Take 1 tablet by mouth at  bedtime.   Jardiance 25 MG Tabs tablet Generic drug: empagliflozin Take 25 mg by mouth daily.   levothyroxine 75 MCG tablet Commonly known as: SYNTHROID Take 75 mcg by mouth daily before breakfast.   metFORMIN 500 MG 24 hr tablet Commonly known as: GLUCOPHAGE-XR Take 1,000 mg by mouth 2 (two) times daily.   traMADol 50 MG tablet Commonly known as: Ultram Take 1-2 tablets (50-100 mg total) by mouth every 6 (six) hours as needed for moderate pain or severe pain.   Trulicity 1.5 XT/0.6YI Sopn Generic drug: Dulaglutide Inject 1.5 mg into the skin once a week. Sunday   Xarelto 10 MG Tabs tablet Generic drug: rivaroxaban Take 10 mg by mouth daily.       Followup: He will followup in a few weeks for post op check and to discuss his surgical pathology results.

## 2020-08-08 MED FILL — EZETIMIBE-SIMVASTATIN 10-20: 10-20 | 90 days supply | Qty: 90 | Fill #2

## 2020-08-09 ENCOUNTER — Other Ambulatory Visit: Payer: Self-pay | Admitting: *Deleted

## 2020-08-09 MED FILL — NOVOTWIST 32G X 5 MM MISC: 32G X 5 MM | 90 days supply | Qty: 100 | Fill #1

## 2020-08-09 NOTE — Patient Outreach (Signed)
[place] [facility]) Care Management  08/09/2020  [name] 09-11-1955 [id number]   Transition of care telephone call  Referral received:08/04/20 Initial outreach:08/09/20 Insurance: [facility], Medicare A  Initial unsuccessful telephone call to patient's preferred number in order to complete transition of care assessment; no answer, left HIPAA compliant voicemail message requesting return call.   Objective: Per electronic record [name] was hospitalized at [facility] 12/2-12/4/21 Robotic assisted laparoscopic radical right nephrectomy , Renal cell carcinoma  Comorbidities include: Pulmonary embolism, DVT, Diabetes type 2,  He was discharged to home on 08/07/20 without the need for home health services or DME.   Plan: This RNCM will route unsuccessful outreach letter with [place] Management pamphlet and 24 hour Nurse Advice Line Magnet to [place] Management clinical pool to be mailed to patient's home address. This RNCM will attempt another outreach within 4 business days.   Joylene Draft, RN, BSN  [place] Management Coordinator  [phone]- Mobile [phone]- Toll Free Main Office

## 2020-08-11 LAB — SURGICAL PATHOLOGY

## 2020-08-12 ENCOUNTER — Other Ambulatory Visit: Payer: Self-pay | Admitting: *Deleted

## 2020-08-12 NOTE — Patient Outreach (Signed)
[place] [facility]) Care Management  08/12/2020  EVO ADERMAN 1956/05/24 [id number]   Transition of care call Referral received: 08/04/20 Initial outreach attempt: 08/09/20 Insurance: [employer], Medicare A   2nd unsuccessful telephone call to patient's preferred contact number in order to complete post hospital discharge transition of care assessment , no answer left HIPAA compliant message requesting return call. Initially placed call to patient spouse by mistake, no answer able to leave a HIPAA compliant message for return call.    Objective: Per electronic record Mr.Fjeld was hospitalized [facility] 12/2-12/4/21 Robotic assisted laparoscopic radical right nephrectomy , Renal cell carcinoma  Comorbidities include: Pulmonary embolism, DVT, Diabetes type 2,  He was discharged to home on 08/07/20 without the need for home health servicesor DME.   Plan If no return call from patient will attempt 3rd outreach in the next 4 business days.    Joylene Draft, RN, BSN  [place] Management Coordinator  [phone]- Mobile [phone]- Toll Free Main Office

## 2020-08-15 MED FILL — metFORMIN HCL ER 500 MG TB2: 500 | 90 days supply | Qty: 360 | Fill #2

## 2020-08-17 ENCOUNTER — Other Ambulatory Visit: Payer: Self-pay | Admitting: *Deleted

## 2020-08-17 NOTE — Patient Outreach (Addendum)
[place] [facility]) Care Management  08/17/2020  JAQUAIL MCLEES Jan 23, 1956 [id number]   Transition of care call Referral received: 08/04/20 Initial outreach attempt: 08/09/20 Insurance: [employer], Medicare A  Third unsuccessful telephone call to patient's preferred contact number in order to complete post hospital discharge transition of care assessment; no answer, left HIPAA compliant message requesting return call.   Objective: [facility] Robotic assisted laparoscopic radical right nephrectomy , Renal cell carcinomaComorbidities include: Pulmonary embolism, DVT, Diabetes type 2, He was discharged to home on12/4/21without the need for home health servicesor DME. Plan: If no return call from patient, will plan 4th call attempt  in the next 3 weeks.    Joylene Draft, RN, BSN  [place] Management Coordinator  [phone]- Mobile [phone]- Toll Free Main Office

## 2020-08-18 MED FILL — BASAGLAR 100 UNIT/ML KWIKPE: 100 | 86 days supply | Qty: 21 | Fill #2

## 2020-08-30 MED FILL — XARELTO 10 MG TABLET: 10 | 90 days supply | Qty: 90 | Fill #2

## 2020-09-06 ENCOUNTER — Other Ambulatory Visit (HOSPITAL_COMMUNITY): Payer: Self-pay | Admitting: Family Medicine

## 2020-09-06 MED FILL — TRULICITY 1.5 MG/0.5 ML PEN: 1.5 | 84 days supply | Qty: 6 | Fill #0

## 2020-09-07 ENCOUNTER — Other Ambulatory Visit: Payer: Self-pay | Admitting: *Deleted

## 2020-09-07 DIAGNOSIS — C641 Malignant neoplasm of right kidney, except renal pelvis: Secondary | ICD-10-CM | POA: Diagnosis not present

## 2020-09-07 NOTE — Patient Outreach (Signed)
[facility]) Care Management  09/07/2020  [name] Nov 05, 1955 [id number]   Transition of care /Case Closure Unsuccessful outreach    Referral received:08/04/20 Initial outreach:08/09/20 Insurance: [employer], [place] A   Unable to complete post hospital discharge transition of care assessment. No return call form patient after 3 call attempts and no response to request to contact RN Care Coordinator in unsuccessful outreach letter mailed to home on 08/09/20.  Objective: [facility] Robotic assisted laparoscopic radical right nephrectomy , Renal cell carcinomaComorbidities include: Pulmonary embolism, DVT, Diabetes type 2, He was discharged to home on12/4/21without the need for home health servicesor DME. Plan Case closed to [facility] [employer] , unsuccessful call attempt x 4.   Joylene Draft, RN, BSN  [place] Management Coordinator  [phone]- Mobile [phone]- Toll Free Main Office

## 2020-09-21 ENCOUNTER — Telehealth: Payer: Self-pay | Admitting: Oncology

## 2020-09-21 NOTE — Telephone Encounter (Signed)
Received a new pt referral from Dr. Alinda Money for renal cell carcinoma. Mr. Peregrine has beenn cld and scheduled to see Dr. Alen Blew on 1/21 at 11am. Pt aware to arrive 20 minutes early.

## 2020-09-22 DIAGNOSIS — Z794 Long term (current) use of insulin: Secondary | ICD-10-CM | POA: Diagnosis not present

## 2020-09-22 DIAGNOSIS — Z0000 Encounter for general adult medical examination without abnormal findings: Secondary | ICD-10-CM | POA: Diagnosis not present

## 2020-09-22 DIAGNOSIS — Z23 Encounter for immunization: Secondary | ICD-10-CM | POA: Diagnosis not present

## 2020-09-22 DIAGNOSIS — R944 Abnormal results of kidney function studies: Secondary | ICD-10-CM | POA: Diagnosis not present

## 2020-09-22 DIAGNOSIS — E032 Hypothyroidism due to medicaments and other exogenous substances: Secondary | ICD-10-CM | POA: Diagnosis not present

## 2020-09-22 DIAGNOSIS — E7800 Pure hypercholesterolemia, unspecified: Secondary | ICD-10-CM | POA: Diagnosis not present

## 2020-09-22 DIAGNOSIS — Z86711 Personal history of pulmonary embolism: Secondary | ICD-10-CM | POA: Diagnosis not present

## 2020-09-22 DIAGNOSIS — E1169 Type 2 diabetes mellitus with other specified complication: Secondary | ICD-10-CM | POA: Diagnosis not present

## 2020-09-22 DIAGNOSIS — Z125 Encounter for screening for malignant neoplasm of prostate: Secondary | ICD-10-CM | POA: Diagnosis not present

## 2020-09-24 ENCOUNTER — Other Ambulatory Visit: Payer: Self-pay

## 2020-09-24 ENCOUNTER — Other Ambulatory Visit: Payer: Self-pay | Admitting: Oncology

## 2020-09-24 ENCOUNTER — Inpatient Hospital Stay: Payer: 59 | Attending: Oncology | Admitting: Oncology

## 2020-09-24 VITALS — BP 177/84 | HR 85 | Temp 97.9°F | Resp 18 | Ht 75.0 in | Wt 190.1 lb

## 2020-09-24 DIAGNOSIS — Z85810 Personal history of malignant neoplasm of tongue: Secondary | ICD-10-CM

## 2020-09-24 DIAGNOSIS — Z808 Family history of malignant neoplasm of other organs or systems: Secondary | ICD-10-CM | POA: Diagnosis not present

## 2020-09-24 DIAGNOSIS — C641 Malignant neoplasm of right kidney, except renal pelvis: Secondary | ICD-10-CM

## 2020-09-24 DIAGNOSIS — Z905 Acquired absence of kidney: Secondary | ICD-10-CM

## 2020-09-24 DIAGNOSIS — C01 Malignant neoplasm of base of tongue: Secondary | ICD-10-CM

## 2020-09-24 MED ORDER — PROCHLORPERAZINE MALEATE 10 MG PO TABS: 10.0000 mg | ORAL_TABLET | Freq: Four times a day (QID) | ORAL | 0 refills | Status: DC | PRN

## 2020-09-24 MED FILL — PROCHLORPERAZINE 10 MG TAB: 10 | 7 days supply | Qty: 30 | Fill #0

## 2020-09-24 NOTE — Progress Notes (Signed)
Reason for the request:    Kidney cancer  HPI: I was asked by Dr. Alinda Money to evaluate [name] for evaluation of renal cell carcinoma.  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  He developed symptoms of hematuria and clot retention and was seen in the emergency department in October 2021.  CT scan at that time showed 7.2 x 4.9 cm right renal mass concerning for malignancy.  MRI of the abdomen confirmed the presence of the renal mass concerning for malignancy.  Tissue biopsy obtained on August 02, 2020 which confirmed the presence of clear cell carcinoma.  Staging work-up did not show any evidence of metastatic disease and subsequently underwent right robotic assisted laparoscopic radical nephrectomy and retroperitoneal lymph node dissection completed on August 05, 2020 the care of Dr. Alinda Money.  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  Tumor invasion the perinephric soft tissue renal vein and upper calyx and renal sinus indicating T3a disease.   Clinically, he reports no major changes in his health.  He has recovered well from surgery except for minor fatigue and regained most activities of daily living.  His performance status quality of life remained excellent.   He does not report any headaches, blurry vision, syncope or seizures. Does not report any fevers, chills or sweats.  Does not report any cough, wheezing or hemoptysis.  Does not report any chest pain, palpitation, orthopnea or leg edema.  Does not report any nausea, vomiting or abdominal pain.  Does not report any constipation or diarrhea.  Does not report any skeletal complaints.    Does not report frequency, urgency or hematuria.  Does not report any skin rashes or  lesions. Does not report any heat or cold intolerance.  Does not report any lymphadenopathy or petechiae.  Does not report any anxiety or depression.  Remaining review of systems is negative.    Past Medical History:  Diagnosis Date  . Alopecia    s/p chemotherapy  . Arthritis   . Bursitis    pt. denies  . Cancer ([place]) 10/2007    tongue/squamous cell,stg IV,HPV positive  . Diabetes mellitus without complication ([place])    takes Amaryl,Januvia,and Metformin,daily  . DVT, lower extremity ([place]) 2009   side effect from chemo and has blood clot left leg-wears compression stockings;was on Coumadin for 71months and has been off 5 yrs  . History of chemotherapy    Cisplatin/Taxotere,5-FU  . History of radiation therapy 01/28/08-03/11/08   left base tongue  . History of shingles   . Hyperlipidemia    takes Vytorin daily  . Hypothyroidism    takes Synthroid daily  . Joint pain   . Joint swelling   . Neuropathy    left foot  . 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 PE (pulmonary embolism) 12/07/2014  . Pneumonia    pt. denies  . Right kidney mass   . Seborrheic keratosis    multiple on back  :  Past Surgical History:  Procedure Laterality Date  . CARDIAC CATHETERIZATION    . feeding tube placed  2009 and then removed  . JOINT REPLACEMENT    . KNEE SURGERY Left 1993  . LYMPH NODE DISSECTION Right 08/05/2020   Procedure: LYMPH  NODE DISSECTION;  Surgeon: Raynelle Bring, MD;  Location: [facility];  Service: Urology;  Laterality: Right;  . MOUTH SURGERY    . PILONIDAL CYST / SINUS EXCISION  1974  . port a cath placement    . PORT-A-CATH REMOVAL  2010  . right wrist fracture Right 2004  . ROBOT ASSISTED LAPAROSCOPIC NEPHRECTOMY Right 08/05/2020   Procedure: XI ROBOTIC ASSISTED LAPAROSCOPIC RADICAL NEPHRECTOMY/PARTIAL ADRENALECTOMY;  Surgeon: Raynelle Bring, MD;  Location: [facility];  Service: Urology;  Laterality: Right;  . SHOULDER ARTHROSCOPY WITH DISTAL CLAVICLE RESECTION  Right 10/26/2016   Procedure: SHOULDER ARTHROSCOPY WITH DISTAL CLAVICLE RESECTION;  Surgeon: Justice Britain, MD;  Location: [facility];  Service: Orthopedics;  Laterality: Right;  . SHOULDER ARTHROSCOPY WITH ROTATOR CUFF REPAIR AND SUBACROMIAL DECOMPRESSION Left 07/16/2014   Procedure: LEFT SHOULDER ARTHROSCOPY WITH ROTATOR CUFF REPAIR AND SUBACROMIAL DECOMPRESSION/DISTAL CLAVICLE RESECTION;  Surgeon: Marin Shutter, MD;  Location: [facility];  Service: Orthopedics;  Laterality: Left;  . SHOULDER ARTHROSCOPY WITH ROTATOR CUFF REPAIR AND SUBACROMIAL DECOMPRESSION Right 10/26/2016   Procedure: RIGHT SHOULDER ARTHROSCOPY WITH ROTATOR CUFF REPAIR AND SUBACROMIAL DECOMPRESSION;  Surgeon: Justice Britain, MD;  Location: [facility];  Service: Orthopedics;  Laterality: Right;  requests 2hrs  . teeth extraction #9    :   Current Outpatient Medications:  .  ezetimibe-simvastatin (VYTORIN) 10-20 MG per tablet, Take 1 tablet by mouth at bedtime.  , Disp: , Rfl:  .  Insulin Glargine (BASAGLAR KWIKPEN) 100 UNIT/ML, Inject 22 Units into the skin at bedtime. , Disp: , Rfl:  .  JARDIANCE 25 MG TABS tablet, Take 25 mg by mouth daily. , Disp: , Rfl:  .  levothyroxine (SYNTHROID, LEVOTHROID) 75 MCG tablet, Take 75 mcg by mouth daily before breakfast., Disp: , Rfl:  .  metFORMIN (GLUCOPHAGE-XR) 500 MG 24 hr tablet, Take 1,000 mg by mouth 2 (two) times daily., Disp: , Rfl: 4 .  traMADol (ULTRAM) 50 MG tablet, Take 1-2 tablets (50-100 mg total) by mouth every 6 (six) hours as needed for moderate pain or severe pain., Disp: 20 tablet, Rfl: 0 .  TRULICITY 1.5 LF/8.1OF SOPN, Inject 1.5 mg into the skin once a week. Sunday, Disp: , Rfl:  .  XARELTO 10 MG TABS tablet, Take 10 mg by mouth daily., Disp: , Rfl:  No current facility-administered medications for this visit.  Facility-Administered Medications Ordered in Other Visits:  .  magnesium citrate solution 0.5 Bottle, 0.5 Bottle, Oral, Once, Raynelle Bring, MD:  No Known  Allergies:  Family History  Problem Relation Age of Onset  . Cancer Mother        uterine  . Diabetes Brother   :  Social History   Socioeconomic History  . Marital status: Married    Spouse name: Not on file  . Number of children: Not on file  . Years of education: Not on file  . Highest education level: Not on file  Occupational History  . Not on file  Tobacco Use  . Smoking status: Never Smoker  . Smokeless tobacco: Never Used  Vaping Use  . Vaping Use: Never used  Substance and Sexual Activity  . Alcohol use: Yes    Comment: occasional drink   . Drug use: No  . Sexual activity: Yes  Other Topics Concern  . Not on file  Social History Narrative  . Not on file   Social Determinants of Health   Financial Resource Strain: Not on file  Food Insecurity: Not on file  Transportation  Needs: Not on file  Physical Activity: Not on file  Stress: Not on file  Social Connections: Not on file  Intimate Partner Violence: Not on file  :  Pertinent items are noted in HPI.  Exam: Blood pressure (!) 177/84, pulse 85, temperature 97.9 F (36.6 C), temperature source Tympanic, resp. rate 18, height 6\' 3"  (1.905 m), weight 190 lb 1.6 oz (86.2 kg), SpO2 100 %.  ECOG 0  General appearance: alert and cooperative appeared without distress. Head: atraumatic without any abnormalities. Eyes: conjunctivae/corneas clear. PERRL.  Sclera anicteric. Throat: lips, mucosa, and tongue normal; without oral thrush or ulcers. Resp: clear to auscultation bilaterally without rhonchi, wheezes or dullness to percussion. Cardio: regular rate and rhythm, S1, S2 normal, no murmur, click, rub or gallop GI: soft, non-tender; bowel sounds normal; no masses,  no organomegaly Skin: Skin color, texture, turgor normal. No rashes or lesions Lymph nodes: Cervical, supraclavicular, and axillary nodes normal. Neurologic: Grossly normal without any motor, sensory or deep tendon reflexes. Musculoskeletal: No  joint deformity or effusion.  CBC    Component Value Date/Time   WBC 5.2 08/02/2020 0709   RBC 4.65 08/02/2020 0709   HGB 13.1 08/07/2020 0608   HGB 15.8 11/18/2012 1331   HCT 41.6 08/07/2020 0608   HCT 47.5 11/18/2012 1331   PLT 208 08/02/2020 0709   PLT 201 11/18/2012 1331   MCV 89.7 08/02/2020 0709   MCV 88.6 11/18/2012 1331   MCH 28.2 08/02/2020 0709   MCHC 31.4 08/02/2020 0709   RDW 12.1 08/02/2020 0709   RDW 12.9 11/18/2012 1331   LYMPHSABS 0.9 06/05/2020 2336   LYMPHSABS 0.8 (L) 11/18/2012 1331   MONOABS 0.5 06/05/2020 2336   MONOABS 0.4 11/18/2012 1331   EOSABS 0.1 06/05/2020 2336   EOSABS 0.1 11/18/2012 1331   BASOSABS 0.0 06/05/2020 2336   BASOSABS 0.0 11/18/2012 1331     Chemistry      Component Value Date/Time   NA 134 (L) 08/07/2020 0608   NA 136 11/18/2012 1331   K 4.1 08/07/2020 0608   K 4.7 11/18/2012 1331   CL 101 08/07/2020 0608   CL 98 11/18/2012 1331   CO2 20 (L) 08/07/2020 0608   CO2 27 11/18/2012 1331   BUN 20 08/07/2020 0608   BUN 24.6 11/18/2012 1331   CREATININE 1.40 (H) 08/07/2020 0608   CREATININE 1.7 (H) 11/18/2012 1331      Component Value Date/Time   CALCIUM 8.4 (L) 08/07/2020 0608   CALCIUM 9.9 11/18/2012 1331   ALKPHOS 89 12/07/2014 0200   ALKPHOS 117 11/18/2012 1331   AST 17 12/07/2014 0200   AST 15 11/18/2012 1331   ALT 16 12/07/2014 0200   ALT 23 11/18/2012 1331   BILITOT 0.7 12/07/2014 0200   BILITOT 0.59 11/18/2012 1331       Assessment and Plan:   65 year old with:  1.  Renal cell carcinoma diagnosed in December 2021.  He was found to have 7 cm right kidney mass.  He underwent surgical resection on December 2 with a laparoscopic radical nephrectomy and retroperitoneal lymph node dissection.  The final pathological staging showed clear-cell histology T3a tumor with high risk features including rhabdoid features as well as tumor invasion into the perinephric fat.  The natural course of his disease was reviewed and  treatment options were discussed.  Risks and benefits of adjuvant Pembrolizumab versus observation and surveillance were discussed at this time.  We reviewed the data associated with the approval of Pembrolizumab in this  setting.  Given his high risk features he would be a reasonable candidate for this with improvement in disease-free survival and likely overall survival once data matures.    Complication associated with this treatment include fatigue, nausea, pruritus and autoimmune complications.  The plan is to proceed with Pembrolizumab 200 mg every 3 weeks for total of 1 year or 17 cycles.  He is agreeable to proceed after education class.  2.  IV access: Peripheral veins will be in use for the time being.  Port-A-Cath will be inserted if needed in the future.  3.  Antiemetics: Prescription for Compazine will be made available for him.  4.  Immune mediated complications: We will continue to monitor that in the future and educate him about potential complications.  These will include pneumonitis, colitis and thyroid disease.  5.  Follow-up: I will be in the near future to start therapy.  60  minutes were dedicated to this visit. The time was spent on reviewing laboratory data, imaging studies, discussing treatment options, and answering questions regarding future plan.     A copy of this consult has been forwarded to the requesting physician.

## 2020-09-24 NOTE — Progress Notes (Signed)
START OFF PATHWAY REGIMEN - Renal Cell   [id number]:Pembrolizumab 200 mg IV D1 q21 Days:   A cycle is every 21 days:     Pembrolizumab   **Always confirm dose/schedule in your pharmacy ordering system**  Patient Characteristics: Preoperative or Nonsurgical Candidate (Clinical Staging), Stage I, II, III Therapeutic Status: Preoperative or Nonsurgical Candidate (Clinical Staging) AJCC M Category: cM0 AJCC 8 Stage Grouping: III AJCC T Category: cT3a AJCC N Category: cN0 Intent of Therapy: Curative Intent, Discussed with Patient

## 2020-09-28 ENCOUNTER — Telehealth: Payer: Self-pay | Admitting: Oncology

## 2020-09-28 NOTE — Telephone Encounter (Signed)
Scheduled per los, patient has been called and notified. 

## 2020-10-01 ENCOUNTER — Inpatient Hospital Stay: Payer: 59

## 2020-10-01 ENCOUNTER — Other Ambulatory Visit: Payer: Self-pay

## 2020-10-05 NOTE — Progress Notes (Signed)
Pharmacist Chemotherapy Monitoring - Initial Assessment    Anticipated start date: 10/12/2020   Regimen:  . Are orders appropriate based on the patient's diagnosis, regimen, and cycle? Yes . Does the plan date match the patient's scheduled date? Yes . Is the sequencing of drugs appropriate? Yes . Are the premedications appropriate for the patient's regimen? Yes . Prior Authorization for treatment is: Pending o If applicable, is the correct biosimilar selected based on the patient's insurance? not applicable  Organ Function and Labs: [name] Are dose adjustments needed based on the patient's renal function, hepatic function, or hematologic function? No . Are appropriate labs ordered prior to the start of patient's treatment? Yes . Other organ system assessment, if indicated: N/A . The following baseline labs, if indicated, have been ordered: pembrolizumab: baseline TSH +/- T4  Dose Assessment: . Are the drug doses appropriate? Yes . Are the following correct: o Drug concentrations Yes o IV fluid compatible with drug Yes o Administration routes Yes o Timing of therapy Yes . If applicable, does the patient have documented access for treatment and/or plans for port-a-cath placement? no . If applicable, have lifetime cumulative doses been properly documented and assessed? not applicable Lifetime Dose Tracking  No doses have been documented on this patient for the following tracked chemicals: Doxorubicin, Epirubicin, Idarubicin, Daunorubicin, Mitoxantrone, Bleomycin, Oxaliplatin, Carboplatin, Liposomal Doxorubicin  o   Toxicity Monitoring/Prevention: . The patient has the following take home antiemetics prescribed: Prochlorperazine and Dexamethasone . The patient has the following take home medications prescribed: N/A . Medication allergies and previous infusion related reactions, if applicable, have been reviewed and addressed. Yes . The patient's current medication list has been assessed for  drug-drug interactions with their chemotherapy regimen. no significant drug-drug interactions were identified on review.  Order Review: . Are the treatment plan orders signed? Yes . Is the patient scheduled to see a provider prior to their treatment? No  I verify that I have reviewed each item in the above checklist and answered each question accordingly.  @RPHNAME @

## 2020-10-12 ENCOUNTER — Inpatient Hospital Stay: Payer: 59

## 2020-10-12 ENCOUNTER — Encounter: Payer: Self-pay | Admitting: Oncology

## 2020-10-12 ENCOUNTER — Inpatient Hospital Stay: Payer: 59 | Attending: Oncology

## 2020-10-12 ENCOUNTER — Other Ambulatory Visit: Payer: Self-pay

## 2020-10-12 VITALS — BP 155/90 | HR 91 | Temp 97.6°F | Resp 16

## 2020-10-12 DIAGNOSIS — Z79899 Other long term (current) drug therapy: Secondary | ICD-10-CM | POA: Diagnosis not present

## 2020-10-12 DIAGNOSIS — Z5112 Encounter for antineoplastic immunotherapy: Secondary | ICD-10-CM | POA: Insufficient documentation

## 2020-10-12 DIAGNOSIS — C641 Malignant neoplasm of right kidney, except renal pelvis: Secondary | ICD-10-CM

## 2020-10-12 LAB — CBC WITH DIFFERENTIAL (CANCER CENTER ONLY)
Abs Immature Granulocytes: 0.02 10*3/uL (ref 0.00–0.07)
Basophils Absolute: 0 10*3/uL (ref 0.0–0.1)
Basophils Relative: 0 %
Eosinophils Absolute: 0.2 10*3/uL (ref 0.0–0.5)
Eosinophils Relative: 2 %
HCT: 45.8 % (ref 39.0–52.0)
Hemoglobin: 14.4 g/dL (ref 13.0–17.0)
Immature Granulocytes: 0 %
Lymphocytes Relative: 13 %
Lymphs Abs: 0.9 10*3/uL (ref 0.7–4.0)
MCH: 27.7 pg (ref 26.0–34.0)
MCHC: 31.4 g/dL (ref 30.0–36.0)
MCV: 88.1 fL (ref 80.0–100.0)
Monocytes Absolute: 0.4 10*3/uL (ref 0.1–1.0)
Monocytes Relative: 6 %
Neutro Abs: 5.3 10*3/uL (ref 1.7–7.7)
Neutrophils Relative %: 79 %
Platelet Count: 227 10*3/uL (ref 150–400)
RBC: 5.2 MIL/uL (ref 4.22–5.81)
RDW: 12.9 % (ref 11.5–15.5)
WBC Count: 6.8 10*3/uL (ref 4.0–10.5)
nRBC: 0 % (ref 0.0–0.2)

## 2020-10-12 LAB — CMP (CANCER CENTER ONLY)
ALT: 22 U/L (ref 0–44)
AST: 17 U/L (ref 15–41)
Albumin: 4 g/dL (ref 3.5–5.0)
Alkaline Phosphatase: 93 U/L (ref 38–126)
Anion gap: 10 (ref 5–15)
BUN: 30 mg/dL — ABNORMAL HIGH (ref 8–23)
CO2: 20 mmol/L — ABNORMAL LOW (ref 22–32)
Calcium: 9 mg/dL (ref 8.9–10.3)
Chloride: 107 mmol/L (ref 98–111)
Creatinine: 1.7 mg/dL — ABNORMAL HIGH (ref 0.61–1.24)
GFR, Estimated: 44 mL/min — ABNORMAL LOW (ref >60)
Glucose, Bld: 229 mg/dL — ABNORMAL HIGH (ref 70–99)
Potassium: 4.9 mmol/L (ref 3.5–5.1)
Sodium: 137 mmol/L (ref 135–145)
Total Bilirubin: 0.4 mg/dL (ref 0.3–1.2)
Total Protein: 7.2 g/dL (ref 6.5–8.1)

## 2020-10-12 LAB — TSH: TSH: 1.937 u[IU]/mL (ref 0.320–4.118)

## 2020-10-12 MED ORDER — SODIUM CHLORIDE 0.9 % IV SOLN
Freq: Once | INTRAVENOUS | Status: AC
  Filled 2020-10-12: qty 250

## 2020-10-12 MED ORDER — SODIUM CHLORIDE 0.9 % IV SOLN
200.0000 mg | Freq: Once | INTRAVENOUS | Status: AC
  Administered 2020-10-12: 200 mg via INTRAVENOUS
  Filled 2020-10-12: qty 8

## 2020-10-12 NOTE — Progress Notes (Signed)
Per Dr. Alen Blew, ok to treat with elevated creatinine 1.70.

## 2020-10-12 NOTE — Patient Instructions (Addendum)
[facility] Discharge Instructions for Patients Receiving Chemotherapy  Today you received the following chemotherapy agents: pembrolizumab Bery[facility] Fl[facility]).  To help prevent nausea and vomiting after your treatment, we encourage you to take your nausea medication as directed.   If you develop nausea and vomiting that is not controlled by your nausea medication, call the clinic.   BELOW ARE SYMPTOMS THAT SHOULD BE REPORTED IMMEDIATELY:  *FEVER GREATER THAN 100.5 F  *CHILLS WITH OR WITHOUT FEVER  NAUSEA AND VOMITING THAT IS NOT CONTROLLED WITH YOUR NAUSEA MEDICATION  *UNUSUAL SHORTNESS OF BREATH  *UNUSUAL BRUISING OR BLEEDING  TENDERNESS IN MOUTH AND THROAT WITH OR WITHOUT PRESENCE OF ULCERS  *URINARY PROBLEMS  *BOWEL PROBLEMS  UNUSUAL RASH Items with * indicate a potential emergency and should be followed up as soon as possible.  Feel free to call the clinic should you have any questions or concerns. The clinic phone number is [phone].  Please show the [facility] at check-in to the Emergency Department and triage nurse.  Pembrolizumab injection What is this medicine? PEMBROLIZUMAB (pem broe liz ue mab) is a monoclonal antibody. It is used to treat certain types of cancer. This medicine may be used for other purposes; ask your health care provider or pharmacist if you have questions. COMMON BRAND NAME(S): Keytruda What should I tell my health care provider before I take this medicine? They need to know if you have any of these conditions:  autoimmune diseases like Crohn's disease, ulcerative colitis, or lupus  have had or planning to have an allogeneic stem cell transplant (uses someone else's stem cells)  history of organ transplant  history of chest radiation  nervous system problems like myasthenia gravis or Guillain-Barre syndrome  an unusual or allergic reaction to pembrolizumab, other medicines, foods, dyes, or preservatives  pregnant or  trying to get pregnant  breast-feeding How should I use this medicine? This medicine is for infusion into a vein. It is given by a health care professional in a hospital or clinic setting. A special MedGuide will be given to you before each treatment. Be sure to read this information carefully each time. Talk to your pediatrician regarding the use of this medicine in children. While this drug may be prescribed for children as young as 6 months for selected conditions, precautions do apply. Overdosage: If you think you have taken too much of this medicine contact a poison control center or emergency room at once. NOTE: This medicine is only for you. Do not share this medicine with others. What if I miss a dose? It is important not to miss your dose. Call your doctor or health care professional if you are unable to keep an appointment. What may interact with this medicine? Interactions have not been studied. This list may not describe all possible interactions. Give your health care provider a list of all the medicines, herbs, non-prescription drugs, or dietary supplements you use. Also tell them if you smoke, drink alcohol, or use illegal drugs. Some items may interact with your medicine. What should I watch for while using this medicine? Your condition will be monitored carefully while you are receiving this medicine. You may need blood work done while you are taking this medicine. Do not become pregnant while taking this medicine or for 4 months after stopping it. Women should inform their doctor if they wish to become pregnant or think they might be pregnant. There is a potential for serious side effects to an unborn child. Talk  to your health care professional or pharmacist for more information. Do not breast-feed an infant while taking this medicine or for 4 months after the last dose. What side effects may I notice from receiving this medicine? Side effects that you should report to your  doctor or health care professional as soon as possible:  allergic reactions like skin rash, itching or hives, swelling of the face, lips, or tongue  bloody or black, tarry  breathing problems  changes in vision  chest pain  chills  confusion  constipation  cough  diarrhea  dizziness or feeling faint or lightheaded  fast or irregular heartbeat  fever  flushing  joint pain  low blood counts - this medicine may decrease the number of white blood cells, red blood cells and platelets. You may be at increased risk for infections and bleeding.  muscle pain  muscle weakness  pain, tingling, numbness in the hands or feet  persistent headache  redness, blistering, peeling or loosening of the skin, including inside the mouth  signs and symptoms of high blood sugar such as dizziness; dry mouth; dry skin; fruity breath; nausea; stomach pain; increased hunger or thirst; increased urination  signs and symptoms of kidney injury like trouble passing urine or change in the amount of urine  signs and symptoms of liver injury like dark urine, light-colored stools, loss of appetite, nausea, right upper belly pain, yellowing of the eyes or skin  sweating  swollen lymph nodes  weight loss Side effects that usually do not require medical attention (report to your doctor or health care professional if they continue or are bothersome):  decreased appetite  hair loss  tiredness This list may not describe all possible side effects. Call your doctor for medical advice about side effects. You may report side effects to FDA at 1-800-FDA-1088. Where should I keep my medicine? This drug is given in a hospital or clinic and will not be stored at home. NOTE: This sheet is a summary. It may not cover all possible information. If you have questions about this medicine, talk to your doctor, pharmacist, or health care provider.  2021 Elsevier/Gold Standard (2019-07-23 21:44:53)

## 2020-10-12 NOTE — Progress Notes (Signed)
Called pt to introduce myself as his Arboriculturist and to discuss the [employer].  Unfortunately there aren't any foundations offering copay assistance for his Dx the type of ins he has.  I left a msg requesting he return my call if he's interested in apply for the grant.

## 2020-10-13 ENCOUNTER — Encounter: Payer: Self-pay | Admitting: Oncology

## 2020-10-13 ENCOUNTER — Telehealth: Payer: Self-pay | Admitting: *Deleted

## 2020-10-13 NOTE — Telephone Encounter (Signed)
Pt returned call & states he is doing fine.  No side effects noted.  He reports knowing how to reach [place] if needed.

## 2020-10-13 NOTE — Progress Notes (Signed)
Called pt to introduce myself as his Arboriculturist.  Unfortunately there aren't any foundations offering copay assistance for his Dx.  I offered the [employer], went over what it covers and gave him the income requirement.  He stated he exceeds that amount so he doesn't qualify for the grant at this time.  I will request the registration staff give him my card at his next visit for any questions or concerns he may have in the future.

## 2020-10-13 NOTE — Telephone Encounter (Signed)
-----   Message from Sinda Du, RN sent at 10/12/2020 12:40 PM EST ----- Regarding: Dr. Alen Blew - 1st chemo f/u 1st chemo f/u

## 2020-10-13 NOTE — Telephone Encounter (Signed)
Left message for pt to return call regarding how he did with his first treatment.

## 2020-10-24 MED FILL — LEVOTHYROXINE 75 MCG TABLET: 75 | 90 days supply | Qty: 90 | Fill #3

## 2020-10-25 MED FILL — JARDIANCE 25 MG TABLET: 25 | 90 days supply | Qty: 90 | Fill #3

## 2020-11-03 ENCOUNTER — Inpatient Hospital Stay: Payer: 59

## 2020-11-03 ENCOUNTER — Inpatient Hospital Stay: Payer: 59 | Admitting: Oncology

## 2020-11-03 ENCOUNTER — Inpatient Hospital Stay: Payer: 59 | Attending: Oncology

## 2020-11-03 ENCOUNTER — Other Ambulatory Visit: Payer: Self-pay

## 2020-11-03 VITALS — BP 116/77 | HR 82 | Temp 97.8°F | Resp 13 | Ht 75.0 in | Wt 194.4 lb

## 2020-11-03 DIAGNOSIS — Z79899 Other long term (current) drug therapy: Secondary | ICD-10-CM | POA: Insufficient documentation

## 2020-11-03 DIAGNOSIS — Z5112 Encounter for antineoplastic immunotherapy: Secondary | ICD-10-CM | POA: Insufficient documentation

## 2020-11-03 DIAGNOSIS — C641 Malignant neoplasm of right kidney, except renal pelvis: Secondary | ICD-10-CM | POA: Diagnosis not present

## 2020-11-03 LAB — CMP (CANCER CENTER ONLY)
ALT: 26 U/L (ref 0–44)
AST: 19 U/L (ref 15–41)
Albumin: 4.1 g/dL (ref 3.5–5.0)
Alkaline Phosphatase: 96 U/L (ref 38–126)
Anion gap: 9 (ref 5–15)
BUN: 34 mg/dL — ABNORMAL HIGH (ref 8–23)
CO2: 21 mmol/L — ABNORMAL LOW (ref 22–32)
Calcium: 9 mg/dL (ref 8.9–10.3)
Chloride: 108 mmol/L (ref 98–111)
Creatinine: 1.92 mg/dL — ABNORMAL HIGH (ref 0.61–1.24)
GFR, Estimated: 38 mL/min — ABNORMAL LOW (ref >60)
Glucose, Bld: 178 mg/dL — ABNORMAL HIGH (ref 70–99)
Potassium: 5 mmol/L (ref 3.5–5.1)
Sodium: 138 mmol/L (ref 135–145)
Total Bilirubin: 0.7 mg/dL (ref 0.3–1.2)
Total Protein: 7.3 g/dL (ref 6.5–8.1)

## 2020-11-03 LAB — CBC WITH DIFFERENTIAL (CANCER CENTER ONLY)
Abs Immature Granulocytes: 0.01 10*3/uL (ref 0.00–0.07)
Basophils Absolute: 0 10*3/uL (ref 0.0–0.1)
Basophils Relative: 1 %
Eosinophils Absolute: 0.2 10*3/uL (ref 0.0–0.5)
Eosinophils Relative: 3 %
HCT: 44.1 % (ref 39.0–52.0)
Hemoglobin: 14.3 g/dL (ref 13.0–17.0)
Immature Granulocytes: 0 %
Lymphocytes Relative: 13 %
Lymphs Abs: 0.8 10*3/uL (ref 0.7–4.0)
MCH: 28.3 pg (ref 26.0–34.0)
MCHC: 32.4 g/dL (ref 30.0–36.0)
MCV: 87.2 fL (ref 80.0–100.0)
Monocytes Absolute: 0.5 10*3/uL (ref 0.1–1.0)
Monocytes Relative: 8 %
Neutro Abs: 4.6 10*3/uL (ref 1.7–7.7)
Neutrophils Relative %: 75 %
Platelet Count: 215 10*3/uL (ref 150–400)
RBC: 5.06 MIL/uL (ref 4.22–5.81)
RDW: 13.2 % (ref 11.5–15.5)
WBC Count: 6 10*3/uL (ref 4.0–10.5)
nRBC: 0 % (ref 0.0–0.2)

## 2020-11-03 LAB — TSH: TSH: 1.875 u[IU]/mL (ref 0.320–4.118)

## 2020-11-03 MED ORDER — SODIUM CHLORIDE 0.9 % IV SOLN
Freq: Once | INTRAVENOUS | Status: AC
  Filled 2020-11-03: qty 250

## 2020-11-03 MED ORDER — SODIUM CHLORIDE 0.9 % IV SOLN
200.0000 mg | Freq: Once | INTRAVENOUS | Status: AC
  Administered 2020-11-03: 200 mg via INTRAVENOUS
  Filled 2020-11-03: qty 8

## 2020-11-03 NOTE — Progress Notes (Signed)
Per Dr. Alen Blew, okay to treat with SCr 1.92.

## 2020-11-03 NOTE — Patient Instructions (Signed)
[facility] Discharge Instructions for Patients Receiving Chemotherapy  Today you received the following chemotherapy agents Pembrolizumab [facility]).  To help prevent nausea and vomiting after your treatment, we encourage you to take your nausea medication as prescribed.   If you develop nausea and vomiting that is not controlled by your nausea medication, call the clinic.   BELOW ARE SYMPTOMS THAT SHOULD BE REPORTED IMMEDIATELY:  *FEVER GREATER THAN 100.5 F  *CHILLS WITH OR WITHOUT FEVER  NAUSEA AND VOMITING THAT IS NOT CONTROLLED WITH YOUR NAUSEA MEDICATION  *UNUSUAL SHORTNESS OF BREATH  *UNUSUAL BRUISING OR BLEEDING  TENDERNESS IN MOUTH AND THROAT WITH OR WITHOUT PRESENCE OF ULCERS  *URINARY PROBLEMS  *BOWEL PROBLEMS  UNUSUAL RASH Items with * indicate a potential emergency and should be followed up as soon as possible.  Feel free to call the clinic should you have any questions or concerns. The clinic phone number is [phone].  Please show the [facility] at check-in to the Emergency Department and triage nurse.

## 2020-11-03 NOTE — Progress Notes (Signed)
Hematology and Oncology Follow Up Visit  [facility] [facility] [id number] Mar 11, 1956 65 y.o. 11/03/2020 12:01 PM [name], MDRoss, [name], MD   Principle Diagnosis: 65 year old man with kidney cancer diagnosed in December 2021.  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   Prior Therapy:  He is status post laparoscopic radical nephrectomy and retroperitoneal lymph node dissection done on August 05, 2020.  He was found to have T3a N0  Current therapy: Pembrolizumab 200 mg every 3 weeks for adjuvant purposes started on October 12, 2020.  Is here for cycle 2 of 17 potentially.  Interim History: [name] returns today for a follow-up visit.  Since the last visit, he received the first cycle of Pembrolizumab without any major complaints.  He denies any nausea, fatigue or excessive tiredness.  He denies any skin rash, pruritus or GI toxicities.  His performance status quality of life remain excellent.     Medications: I have reviewed the patient's current medications.  Current Outpatient Medications  Medication Sig Dispense Refill  . ezetimibe-simvastatin (VYTORIN) 10-20 MG per tablet Take 1 tablet by mouth at bedtime.    . Insulin Glargine (BASAGLAR KWIKPEN) 100 UNIT/ML Inject 22 Units into the skin at bedtime.     [name] JARDIANCE 25 MG TABS tablet Take 25 mg by mouth daily.     [name] levothyroxine (SYNTHROID, LEVOTHROID) 75 MCG tablet Take 75 mcg by mouth daily before breakfast.    . metFORMIN (GLUCOPHAGE-XR) 500 MG 24 hr tablet Take 1,000 mg by mouth 2 (two) times daily.  4  . prochlorperazine (COMPAZINE) 10 MG tablet Take 1 tablet (10 mg total) by mouth every 6 (six) hours as needed for nausea or vomiting. 30 tablet 0  . traMADol (ULTRAM) 50 MG tablet Take 1-2 tablets (50-100 mg total) by mouth every 6 (six) hours as needed for moderate pain or severe pain. (Patient not taking: Reported on 09/24/2020) 20 tablet 0  . TRULICITY 1.5  OM/7.6HM SOPN Inject 1.5 mg into the skin once a week. Sunday    . XARELTO 10 MG TABS tablet Take 10 mg by mouth daily.     No current facility-administered medications for this visit.   Facility-Administered Medications Ordered in Other Visits  Medication Dose Route Frequency Provider Last Rate Last Admin  . magnesium citrate solution 0.5 Bottle  0.5 Bottle Oral Once Raynelle Bring, MD         Allergies: No Known Allergies    Physical Exam: Blood pressure 116/77, pulse 82, temperature 97.8 F (36.6 C), temperature source Tympanic, resp. rate 13, height 6\' 3"  (1.905 m), weight 194 lb 6.4 oz (88.2 kg), SpO2 99 %. ECOG:  0   General appearance: Comfortable appearing without any discomfort Head: Normocephalic without any trauma Oropharynx: Mucous membranes are moist and pink without any thrush or ulcers. Eyes: Pupils are equal and round reactive to light. Lymph nodes: No cervical, supraclavicular, inguinal or axillary lymphadenopathy.   Heart:regular rate and rhythm.  S1 and S2 without leg edema. Lung: Clear without any rhonchi or wheezes.  No dullness to percussion. Abdomin: Soft, nontender, nondistended with good bowel sounds.  No hepatosplenomegaly. Musculoskeletal: No joint deformity or effusion.  Full range of motion noted. Neurological: No deficits noted on motor, sensory and deep tendon reflex exam. Skin: No petechial rash or dryness.  Appeared moist.      Lab Results: Lab Results  Component Value Date   WBC 6.0 11/03/2020  HGB 14.3 11/03/2020   HCT 44.1 11/03/2020   MCV 87.2 11/03/2020   PLT 215 11/03/2020     Chemistry      Component Value Date/Time   NA 137 10/12/2020 1120   NA 136 11/18/2012 1331   K 4.9 10/12/2020 1120   K 4.7 11/18/2012 1331   CL 107 10/12/2020 1120   CL 98 11/18/2012 1331   CO2 20 (L) 10/12/2020 1120   CO2 27 11/18/2012 1331   BUN 30 (H) 10/12/2020 1120   BUN 24.6 11/18/2012 1331   CREATININE 1.70 (H) 10/12/2020 1120   CREATININE  1.7 (H) 11/18/2012 1331      Component Value Date/Time   CALCIUM 9.0 10/12/2020 1120   CALCIUM 9.9 11/18/2012 1331   ALKPHOS 93 10/12/2020 1120   ALKPHOS 117 11/18/2012 1331   AST 17 10/12/2020 1120   AST 15 11/18/2012 1331   ALT 22 10/12/2020 1120   ALT 23 11/18/2012 1331   BILITOT 0.4 10/12/2020 1120   BILITOT 0.59 11/18/2012 1331         Impression and Plan:   65 year old with:  1.    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  He was found to have 7 cm right kidney mass.     He is currently receiving Pembrolizumab adjuvantly after radical nephrectomy in December 2021.  Risks and benefits of continuing this treatment and the rationale for this treatment were discussed again.  Updated data from clinical study that related to approval of this drug were reviewed and continues to show improvement in progression free survival.  Complication associated with this treatment including immune mediated issues, GI toxicity and skin rash.  For the time being he is agreeable to continue and the plan is to continue with this treatment every 3 weeks for a total of 17 cycles.  2.  IV access: No issues reported with his peripheral veins.  Port-A-Cath option has been deferred.  3.  Antiemetics: No nausea or vomiting reported.  Compazine is available to him.  4.  Immune mediated complications: None reported at this time we will continue to monitor.  Issues such as colitis pneumonitis thyroid disease were reviewed.  5.  Follow-up: In 3 weeks for the next cycle of therapy.  30  minutes were spent on this encounter.  Time was dedicated to reviewing his disease status, discussing complications of therapy and the rationale for using this treatment.      Zola Button, MD 3/2/202212:01 PM

## 2020-11-09 ENCOUNTER — Telehealth: Payer: Self-pay | Admitting: Oncology

## 2020-11-09 NOTE — Telephone Encounter (Signed)
Release: [id number]   Faxed ov note to [facility] fax# [phone]

## 2020-11-14 MED FILL — metFORMIN HCL ER 500 MG TB2: 500 | 90 days supply | Qty: 360 | Fill #3

## 2020-11-14 MED FILL — EZETIMIBE-SIMVASTATIN 10-20: 10-20 | 90 days supply | Qty: 90 | Fill #3

## 2020-11-23 ENCOUNTER — Other Ambulatory Visit (HOSPITAL_COMMUNITY): Payer: Self-pay | Admitting: Family Medicine

## 2020-11-23 MED FILL — NOVOTWIST 32G X 5 MM MISC: 32G X 5 MM | 90 days supply | Qty: 100 | Fill #0

## 2020-11-24 ENCOUNTER — Other Ambulatory Visit: Payer: Self-pay

## 2020-11-24 ENCOUNTER — Inpatient Hospital Stay: Payer: 59 | Admitting: Oncology

## 2020-11-24 ENCOUNTER — Inpatient Hospital Stay: Payer: 59

## 2020-11-24 VITALS — BP 124/69 | HR 90 | Temp 97.0°F | Resp 18 | Ht 75.0 in | Wt 199.3 lb

## 2020-11-24 DIAGNOSIS — C641 Malignant neoplasm of right kidney, except renal pelvis: Secondary | ICD-10-CM

## 2020-11-24 DIAGNOSIS — Z5112 Encounter for antineoplastic immunotherapy: Secondary | ICD-10-CM | POA: Diagnosis not present

## 2020-11-24 DIAGNOSIS — Z79899 Other long term (current) drug therapy: Secondary | ICD-10-CM | POA: Diagnosis not present

## 2020-11-24 LAB — CMP (CANCER CENTER ONLY)
ALT: 21 U/L (ref 0–44)
AST: 17 U/L (ref 15–41)
Albumin: 4 g/dL (ref 3.5–5.0)
Alkaline Phosphatase: 88 U/L (ref 38–126)
Anion gap: 12 (ref 5–15)
BUN: 31 mg/dL — ABNORMAL HIGH (ref 8–23)
CO2: 23 mmol/L (ref 22–32)
Calcium: 8.9 mg/dL (ref 8.9–10.3)
Chloride: 106 mmol/L (ref 98–111)
Creatinine: 1.84 mg/dL — ABNORMAL HIGH (ref 0.61–1.24)
GFR, Estimated: 40 mL/min — ABNORMAL LOW (ref >60)
Glucose, Bld: 176 mg/dL — ABNORMAL HIGH (ref 70–99)
Potassium: 4.8 mmol/L (ref 3.5–5.1)
Sodium: 141 mmol/L (ref 135–145)
Total Bilirubin: 0.6 mg/dL (ref 0.3–1.2)
Total Protein: 7 g/dL (ref 6.5–8.1)

## 2020-11-24 LAB — CBC WITH DIFFERENTIAL (CANCER CENTER ONLY)
Abs Immature Granulocytes: 0.02 10*3/uL (ref 0.00–0.07)
Basophils Absolute: 0 10*3/uL (ref 0.0–0.1)
Basophils Relative: 1 %
Eosinophils Absolute: 0.2 10*3/uL (ref 0.0–0.5)
Eosinophils Relative: 4 %
HCT: 43.6 % (ref 39.0–52.0)
Hemoglobin: 13.8 g/dL (ref 13.0–17.0)
Immature Granulocytes: 0 %
Lymphocytes Relative: 19 %
Lymphs Abs: 1.1 10*3/uL (ref 0.7–4.0)
MCH: 27.8 pg (ref 26.0–34.0)
MCHC: 31.7 g/dL (ref 30.0–36.0)
MCV: 87.9 fL (ref 80.0–100.0)
Monocytes Absolute: 0.6 10*3/uL (ref 0.1–1.0)
Monocytes Relative: 10 %
Neutro Abs: 3.8 10*3/uL (ref 1.7–7.7)
Neutrophils Relative %: 66 %
Platelet Count: 208 10*3/uL (ref 150–400)
RBC: 4.96 MIL/uL (ref 4.22–5.81)
RDW: 13.4 % (ref 11.5–15.5)
WBC Count: 5.7 10*3/uL (ref 4.0–10.5)
nRBC: 0 % (ref 0.0–0.2)

## 2020-11-24 LAB — TSH: TSH: 2.437 u[IU]/mL (ref 0.320–4.118)

## 2020-11-24 MED ORDER — SODIUM CHLORIDE 0.9 % IV SOLN
Freq: Once | INTRAVENOUS | Status: AC
  Filled 2020-11-24: qty 250

## 2020-11-24 MED ORDER — SODIUM CHLORIDE 0.9 % IV SOLN
400.0000 mg | Freq: Once | INTRAVENOUS | Status: AC
  Administered 2020-11-24: 400 mg via INTRAVENOUS
  Filled 2020-11-24: qty 16

## 2020-11-24 NOTE — Progress Notes (Signed)
Per Dr. Alen Blew: okay to treat with elevated Scr of 1.84

## 2020-11-24 NOTE — Patient Instructions (Signed)
[facility] Discharge Instructions for Patients Receiving Chemotherapy  Today you received the following chemotherapy agents Pembrolizumab [facility]).  To help prevent nausea and vomiting after your treatment, we encourage you to take your nausea medication as prescribed.   If you develop nausea and vomiting that is not controlled by your nausea medication, call the clinic.   BELOW ARE SYMPTOMS THAT SHOULD BE REPORTED IMMEDIATELY:  *FEVER GREATER THAN 100.5 F  *CHILLS WITH OR WITHOUT FEVER  NAUSEA AND VOMITING THAT IS NOT CONTROLLED WITH YOUR NAUSEA MEDICATION  *UNUSUAL SHORTNESS OF BREATH  *UNUSUAL BRUISING OR BLEEDING  TENDERNESS IN MOUTH AND THROAT WITH OR WITHOUT PRESENCE OF ULCERS  *URINARY PROBLEMS  *BOWEL PROBLEMS  UNUSUAL RASH Items with * indicate a potential emergency and should be followed up as soon as possible.  Feel free to call the clinic should you have any questions or concerns. The clinic phone number is [phone].  Please show the [place] at check-in to the Emergency Department and triage nurse.

## 2020-11-24 NOTE — Progress Notes (Signed)
Hematology and Oncology Follow Up Visit  [name] [id number] 02-Feb-1956 65 y.o. 11/24/2020 9:16 AM [name], MDRoss, Clarence Luo, MD   Principle Diagnosis: 65 year old man with T3a clear-cell, renal cell carcinoma diagnosed with rhabdoid features and invasion into perinephric fat in December 2021.   Prior Therapy:  He is status post laparoscopic radical nephrectomy and retroperitoneal lymph node dissection done on August 05, 2020.  He was found to have T3a N0  Current therapy: Pembrolizumab 200 mg every 3 weeks for adjuvant purposes started on October 12, 2020.  He is here for cycle 3 of 17.   Interim History: Mr. [name] presents today for a return evaluation.  Since the last visit, he reports feeling well without any major complaints.  He denies any nausea, fatigue or pruritus.  He denies any hospitalization or illnesses.  Continues to be active and perform activities of daily living without any decline.     Medications: Reviewed without changes. Current Outpatient Medications  Medication Sig Dispense Refill  . ezetimibe-simvastatin (VYTORIN) 10-20 MG per tablet Take 1 tablet by mouth at bedtime.    . Insulin Glargine (BASAGLAR KWIKPEN) 100 UNIT/ML Inject 22 Units into the skin at bedtime.     [name] JARDIANCE 25 MG TABS tablet Take 25 mg by mouth daily.     [name] levothyroxine (SYNTHROID, LEVOTHROID) 75 MCG tablet Take 75 mcg by mouth daily before breakfast.    . metFORMIN (GLUCOPHAGE-XR) 500 MG 24 hr tablet Take 1,000 mg by mouth 2 (two) times daily.  4  . prochlorperazine (COMPAZINE) 10 MG tablet Take 1 tablet (10 mg total) by mouth every 6 (six) hours as needed for nausea or vomiting. 30 tablet 0  . TRULICITY 1.5 OV/5.6EP SOPN Inject 1.5 mg into the skin once a week. Sunday    . XARELTO 10 MG TABS tablet Take 10 mg by mouth daily.     No current facility-administered medications for this visit.   Facility-Administered Medications Ordered in Other Visits  Medication Dose  Route Frequency Provider Last Rate Last Admin  . magnesium citrate solution 0.5 Bottle  0.5 Bottle Oral Once Raynelle Bring, MD         Allergies: No Known Allergies    Physical Exam: Blood pressure 124/69, pulse 90, temperature (!) 97 F (36.1 C), temperature source Tympanic, resp. rate 18, height 6\' 3"  (1.905 m), weight 199 lb 4.8 oz (90.4 kg), SpO2 100 %.   ECOG:  0    General appearance: Alert, awake without any distress. Head: Atraumatic without abnormalities Oropharynx: Without any thrush or ulcers. Eyes: No scleral icterus. Lymph nodes: No lymphadenopathy noted in the cervical, supraclavicular, or axillary nodes Heart:regular rate and rhythm, without any murmurs or gallops.   Lung: Clear to auscultation without any rhonchi, wheezes or dullness to percussion. Abdomin: Soft, nontender without any shifting dullness or ascites. Musculoskeletal: No clubbing or cyanosis. Neurological: No motor or sensory deficits. Skin: No rashes or lesions.      Lab Results: Lab Results  Component Value Date   WBC 6.0 11/03/2020   HGB 14.3 11/03/2020   HCT 44.1 11/03/2020   MCV 87.2 11/03/2020   PLT 215 11/03/2020     Chemistry      Component Value Date/Time   NA 138 11/03/2020 1131   NA 136 11/18/2012 1331   K 5.0 11/03/2020 1131   K 4.7 11/18/2012 1331   CL 108 11/03/2020 1131   CL 98 11/18/2012 1331   CO2 21 (L) 11/03/2020 1131  CO2 27 11/18/2012 1331   BUN 34 (H) 11/03/2020 1131   BUN 24.6 11/18/2012 1331   CREATININE 1.92 (H) 11/03/2020 1131   CREATININE 1.7 (H) 11/18/2012 1331      Component Value Date/Time   CALCIUM 9.0 11/03/2020 1131   CALCIUM 9.9 11/18/2012 1331   ALKPHOS 96 11/03/2020 1131   ALKPHOS 117 11/18/2012 1331   AST 19 11/03/2020 1131   AST 15 11/18/2012 1331   ALT 26 11/03/2020 1131   ALT 23 11/18/2012 1331   BILITOT 0.7 11/03/2020 1131   BILITOT 0.59 11/18/2012 1331         Impression and Plan:   65 year old with:  1.    Kidney  cancer diagnosed in December 2021.  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    His disease status was updated at this time and treatment options were reviewed.  He continues to tolerate Pembrolizumab without any major complications.  Risks and benefits of continuing this treatment versus observation were reiterated.  Switching to every 6-week regimen was also discussed with the patient today.  After discussion today, he is agreeable to proceed with treatment every 6 weeks starting today..  2.  IV access: Peripheral veins are currently in use without any complications.  Port-A-Cath option was deferred.  3.  Antiemetics: Compazine is available to him without any nausea or vomiting.  4.  Immune mediated complications: I continue to educate him about potential complications including pneumonitis, colitis and thyroid disease.  5.  Follow-up: He will return in 6 weeks for a follow-up and the next Pembrolizumab infusion.  30  minutes were dedicated to this visit.  The time was spent on reviewing laboratory data, disease status update and outlining future plan of care.      Zola Button, MD 3/23/20229:16 AM

## 2020-11-26 ENCOUNTER — Telehealth: Payer: Self-pay | Admitting: Oncology

## 2020-11-26 NOTE — Telephone Encounter (Signed)
R/s appts per 3/24 sch msg. Pt aware. MD approved.

## 2020-11-29 ENCOUNTER — Other Ambulatory Visit (HOSPITAL_COMMUNITY): Payer: Self-pay | Admitting: Family Medicine

## 2020-11-29 MED FILL — TRULICITY 1.5 MG/0.5 ML PEN: 1.5 | 84 days supply | Qty: 6 | Fill #0

## 2020-11-29 MED FILL — XARELTO 10 MG TABLET: 10 | 90 days supply | Qty: 90 | Fill #3

## 2020-12-07 NOTE — Progress Notes (Signed)
..  The following Medication: Beryle Flock has been approved thru [employer] as Risk manager. Enrollment period is 12/06/2020 to 12/06/2021.  Assistance ID: [id number].  Reason for Assistance: EOOP First DOS: 01/11/2021.

## 2020-12-15 ENCOUNTER — Other Ambulatory Visit: Payer: 59

## 2020-12-15 ENCOUNTER — Ambulatory Visit: Payer: 59

## 2020-12-15 ENCOUNTER — Ambulatory Visit: Payer: 59 | Admitting: Oncology

## 2020-12-30 ENCOUNTER — Other Ambulatory Visit (HOSPITAL_BASED_OUTPATIENT_CLINIC_OR_DEPARTMENT_OTHER): Payer: Self-pay

## 2020-12-30 ENCOUNTER — Other Ambulatory Visit: Payer: Self-pay

## 2020-12-30 ENCOUNTER — Ambulatory Visit: Payer: 59 | Attending: Internal Medicine

## 2020-12-30 ENCOUNTER — Other Ambulatory Visit (HOSPITAL_COMMUNITY): Payer: Self-pay

## 2020-12-30 DIAGNOSIS — Z23 Encounter for immunization: Secondary | ICD-10-CM

## 2020-12-30 MED ORDER — COVID-19 MRNA VACC (MODERNA) 100 MCG/0.5ML IM SUSP
INTRAMUSCULAR | 0 refills | Status: DC
  Filled 2020-12-30: qty 0.25, 1d supply, fill #0

## 2020-12-30 NOTE — Progress Notes (Signed)
   Covid-19 Vaccination Clinic  Name:  [name]    MRN: [id number] DOB: February 12, 1956  12/30/2020  Mr. Muzyka was observed post Covid-19 immunization for 15 minutes without incident. He was provided with Vaccine Information Sheet and instruction to access the V-Safe system.   Mr. Hornstein was instructed to call 911 with any severe reactions post vaccine: [name] Difficulty breathing  . Swelling of face and throat  . A fast heartbeat  . A bad rash all over body  . Dizziness and weakness   Immunizations Administered    Name Date Dose VIS Date Route   Moderna Covid-19 Booster Vaccine 12/30/2020  9:57 AM 0.25 mL 06/23/2020 Intramuscular   Manufacturer: Moderna   Lot: [id number]   [place]: [id number]

## 2021-01-05 ENCOUNTER — Other Ambulatory Visit: Payer: 59

## 2021-01-05 ENCOUNTER — Ambulatory Visit: Payer: 59

## 2021-01-05 ENCOUNTER — Ambulatory Visit: Payer: 59 | Admitting: Oncology

## 2021-01-11 ENCOUNTER — Inpatient Hospital Stay: Payer: 59 | Admitting: Oncology

## 2021-01-11 ENCOUNTER — Inpatient Hospital Stay: Payer: 59 | Attending: Oncology

## 2021-01-11 ENCOUNTER — Other Ambulatory Visit: Payer: Self-pay

## 2021-01-11 ENCOUNTER — Inpatient Hospital Stay: Payer: 59

## 2021-01-11 VITALS — BP 116/61 | HR 90 | Temp 97.3°F | Resp 17 | Wt 197.2 lb

## 2021-01-11 DIAGNOSIS — Z79899 Other long term (current) drug therapy: Secondary | ICD-10-CM | POA: Diagnosis not present

## 2021-01-11 DIAGNOSIS — C641 Malignant neoplasm of right kidney, except renal pelvis: Secondary | ICD-10-CM | POA: Diagnosis not present

## 2021-01-11 DIAGNOSIS — Z5112 Encounter for antineoplastic immunotherapy: Secondary | ICD-10-CM | POA: Insufficient documentation

## 2021-01-11 LAB — CBC WITH DIFFERENTIAL (CANCER CENTER ONLY)
Abs Immature Granulocytes: 0.03 10*3/uL (ref 0.00–0.07)
Basophils Absolute: 0 10*3/uL (ref 0.0–0.1)
Basophils Relative: 1 %
Eosinophils Absolute: 0.2 10*3/uL (ref 0.0–0.5)
Eosinophils Relative: 3 %
HCT: 45.1 % (ref 39.0–52.0)
Hemoglobin: 14.5 g/dL (ref 13.0–17.0)
Immature Granulocytes: 0 %
Lymphocytes Relative: 9 %
Lymphs Abs: 0.8 10*3/uL (ref 0.7–4.0)
MCH: 28.1 pg (ref 26.0–34.0)
MCHC: 32.2 g/dL (ref 30.0–36.0)
MCV: 87.4 fL (ref 80.0–100.0)
Monocytes Absolute: 0.6 10*3/uL (ref 0.1–1.0)
Monocytes Relative: 6 %
Neutro Abs: 7 10*3/uL (ref 1.7–7.7)
Neutrophils Relative %: 81 %
Platelet Count: 243 10*3/uL (ref 150–400)
RBC: 5.16 MIL/uL (ref 4.22–5.81)
RDW: 12.5 % (ref 11.5–15.5)
WBC Count: 8.6 10*3/uL (ref 4.0–10.5)
nRBC: 0 % (ref 0.0–0.2)

## 2021-01-11 LAB — CMP (CANCER CENTER ONLY)
ALT: 14 U/L (ref 0–44)
AST: 16 U/L (ref 15–41)
Albumin: 4.1 g/dL (ref 3.5–5.0)
Alkaline Phosphatase: 91 U/L (ref 38–126)
Anion gap: 12 (ref 5–15)
BUN: 36 mg/dL — ABNORMAL HIGH (ref 8–23)
CO2: 21 mmol/L — ABNORMAL LOW (ref 22–32)
Calcium: 9.4 mg/dL (ref 8.9–10.3)
Chloride: 105 mmol/L (ref 98–111)
Creatinine: 1.93 mg/dL — ABNORMAL HIGH (ref 0.61–1.24)
GFR, Estimated: 37 mL/min — ABNORMAL LOW (ref >60)
Glucose, Bld: 183 mg/dL — ABNORMAL HIGH (ref 70–99)
Potassium: 5.2 mmol/L — ABNORMAL HIGH (ref 3.5–5.1)
Sodium: 138 mmol/L (ref 135–145)
Total Bilirubin: 0.6 mg/dL (ref 0.3–1.2)
Total Protein: 7.2 g/dL (ref 6.5–8.1)

## 2021-01-11 LAB — TSH: TSH: 2.737 u[IU]/mL (ref 0.320–4.118)

## 2021-01-11 MED ORDER — SODIUM CHLORIDE 0.9 % IV SOLN
400.0000 mg | Freq: Once | INTRAVENOUS | Status: AC
  Administered 2021-01-11: 400 mg via INTRAVENOUS
  Filled 2021-01-11: qty 16

## 2021-01-11 MED ORDER — SODIUM CHLORIDE 0.9 % IV SOLN
Freq: Once | INTRAVENOUS | Status: AC
  Filled 2021-01-11: qty 250

## 2021-01-11 NOTE — Progress Notes (Signed)
Ok to treat with Keytruda today, Scr 1.9, per Dr. Alen Blew.

## 2021-01-11 NOTE — Patient Instructions (Addendum)
[facility]  Discharge Instructions: Thank you for choosing [facility] to provide your oncology and hematology care.   If you have a lab appointment with the [facility], please go directly to the [facility] and check in at the registration area.   Wear comfortable clothing and clothing appropriate for easy access to any Portacath or PICC line.   We strive to give you quality time with your provider. You may need to reschedule your appointment if you arrive late (15 or more minutes).  Arriving late affects you and other patients whose appointments are after yours.  Also, if you miss three or more appointments without notifying the office, you may be dismissed from the clinic at the provider's discretion.      For prescription refill requests, have your pharmacy contact our office and allow 72 hours for refills to be completed.    Today you received the following chemotherapy and/or immunotherapy agents Keytruda     To help prevent nausea and vomiting after your treatment, we encourage you to take your nausea medication as directed.  BELOW ARE SYMPTOMS THAT SHOULD BE REPORTED IMMEDIATELY: . *FEVER GREATER THAN 100.4 F (38 C) OR HIGHER . *CHILLS OR SWEATING . *NAUSEA AND VOMITING THAT IS NOT CONTROLLED WITH YOUR NAUSEA MEDICATION . *UNUSUAL SHORTNESS OF BREATH . *UNUSUAL BRUISING OR BLEEDING . *URINARY PROBLEMS (pain or burning when urinating, or frequent urination) . *BOWEL PROBLEMS (unusual diarrhea, constipation, pain near the anus) . TENDERNESS IN MOUTH AND THROAT WITH OR WITHOUT PRESENCE OF ULCERS (sore throat, sores in mouth, or a toothache) . UNUSUAL RASH, SWELLING OR PAIN  . UNUSUAL VAGINAL DISCHARGE OR ITCHING   Items with * indicate a potential emergency and should be followed up as soon as possible or go to the Emergency Department if any problems should occur.  Please show the CHEMOTHERAPY ALERT CARD or IMMUNOTHERAPY ALERT  CARD at check-in to the Emergency Department and triage nurse.  Should you have questions after your visit or need to cancel or reschedule your appointment, please contact [facility]  Dept: [phone]  and follow the prompts.  Office hours are 8:00 a.m. to 4:30 p.m. Monday - Friday. Please note that voicemails left after 4:00 p.m. may not be returned until the following business day.  We are closed weekends and major holidays. You have access to a nurse at all times for urgent questions. Please call the main number to the clinic Dept: [phone] and follow the prompts.   For any non-urgent questions, you may also contact your provider using MyChart. We now offer e-Visits for anyone 83 and older to request care online for non-urgent symptoms. For details visit [email or web address].   Also download the MyChart app! Go to the app store, search "MyChart", open the app, select [facility], and log in with your MyChart username and password.  Due to Covid, a mask is required upon entering the hospital/clinic. If you do not have a mask, one will be given to you upon arrival. For doctor visits, patients may have 1 support person aged 88 or older with them. For treatment visits, patients cannot have anyone with them due to current Covid guidelines and our immunocompromised population.   Pembrolizumab injection What is this medicine? PEMBROLIZUMAB (pem broe liz ue mab) is a monoclonal antibody. It is used to treat certain types of cancer. This medicine may be used for other purposes; ask your health care provider or pharmacist  if you have questions. COMMON BRAND NAME(S): Keytruda What should I tell my health care provider before I take this medicine? They need to know if you have any of these conditions:  autoimmune diseases like Crohn's disease, ulcerative colitis, or lupus  have had or planning to have an allogeneic stem cell transplant (uses someone else's stem  cells)  history of organ transplant  history of chest radiation  nervous system problems like myasthenia gravis or Guillain-Barre syndrome  an unusual or allergic reaction to pembrolizumab, other medicines, foods, dyes, or preservatives  pregnant or trying to get pregnant  breast-feeding How should I use this medicine? This medicine is for infusion into a vein. It is given by a health care professional in a hospital or clinic setting. A special MedGuide will be given to you before each treatment. Be sure to read this information carefully each time. Talk to your pediatrician regarding the use of this medicine in children. While this drug may be prescribed for children as young as 6 months for selected conditions, precautions do apply. Overdosage: If you think you have taken too much of this medicine contact a poison control center or emergency room at once. NOTE: This medicine is only for you. Do not share this medicine with others. What if I miss a dose? It is important not to miss your dose. Call your doctor or health care professional if you are unable to keep an appointment. What may interact with this medicine? Interactions have not been studied. This list may not describe all possible interactions. Give your health care provider a list of all the medicines, herbs, non-prescription drugs, or dietary supplements you use. Also tell them if you smoke, drink alcohol, or use illegal drugs. Some items may interact with your medicine. What should I watch for while using this medicine? Your condition will be monitored carefully while you are receiving this medicine. You may need blood work done while you are taking this medicine. Do not become pregnant while taking this medicine or for 4 months after stopping it. Women should inform their doctor if they wish to become pregnant or think they might be pregnant. There is a potential for serious side effects to an unborn child. Talk to your  health care professional or pharmacist for more information. Do not breast-feed an infant while taking this medicine or for 4 months after the last dose. What side effects may I notice from receiving this medicine? Side effects that you should report to your doctor or health care professional as soon as possible:  allergic reactions like skin rash, itching or hives, swelling of the face, lips, or tongue  bloody or black, tarry  breathing problems  changes in vision  chest pain  chills  confusion  constipation  cough  diarrhea  dizziness or feeling faint or lightheaded  fast or irregular heartbeat  fever  flushing  joint pain  low blood counts - this medicine may decrease the number of white blood cells, red blood cells and platelets. You may be at increased risk for infections and bleeding.  muscle pain  muscle weakness  pain, tingling, numbness in the hands or feet  persistent headache  redness, blistering, peeling or loosening of the skin, including inside the mouth  signs and symptoms of high blood sugar such as dizziness; dry mouth; dry skin; fruity breath; nausea; stomach pain; increased hunger or thirst; increased urination  signs and symptoms of kidney injury like trouble passing urine or change in the amount  of urine  signs and symptoms of liver injury like dark urine, light-colored stools, loss of appetite, nausea, right upper belly pain, yellowing of the eyes or skin  sweating  swollen lymph nodes  weight loss Side effects that usually do not require medical attention (report to your doctor or health care professional if they continue or are bothersome):  decreased appetite  hair loss  tiredness This list may not describe all possible side effects. Call your doctor for medical advice about side effects. You may report side effects to FDA at 1-800-FDA-1088. Where should I keep my medicine? This drug is given in a hospital or clinic and will  not be stored at home. NOTE: This sheet is a summary. It may not cover all possible information. If you have questions about this medicine, talk to your doctor, pharmacist, or health care provider.  2021 Elsevier/Gold Standard (2019-07-23 21:44:53)

## 2021-01-11 NOTE — Progress Notes (Signed)
Hematology and Oncology Follow Up Visit  [name] [id number] 1956/01/11 65 y.o. 01/11/2021 8:14 AM [name], MDRoss, Clarence Luo, MD   Principle Diagnosis: 65 year old man with kidney cancer diagnosed in December 2021.  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    Prior Therapy:  He is status post laparoscopic radical nephrectomy and retroperitoneal lymph node dissection done on August 05, 2020.  He was found to have T3a N0  Current therapy: Pembrolizumab 200 mg every 3 weeks for adjuvant purposes started on October 12, 2020.  He received cycle 3 with 400 mg and will be given every 6 weeks to complete a full year of therapy.  Interim History: [name] returns today for a follow-up visit.  Since the last visit, he reports no major changes in his health.  He has tolerated Pembrolizumab 400 mg dosing without any major changes.  He denies any nausea, vomiting or skin rash.  He denies any recent pruritus or hospitalizations.  Denies any diarrhea or respiratory complaints.     Medications: Updated on review. Current Outpatient Medications  Medication Sig Dispense Refill  . COVID-19 mRNA vaccine, Moderna, 100 MCG/0.5ML injection Inject into the muscle. 0.25 mL 0  . COVID-19 mRNA vaccine, Pfizer, 30 MCG/0.3ML injection INJECT AS DIRECTED .3 mL 0  . Dulaglutide 1.5 MG/0.5ML SOPN INJECT 1 PEN UNDER THE SKIN ONCE A WEEK 6 mL 0  . Dulaglutide 1.5 MG/0.5ML SOPN INJECT 1 PEN UNDER THE SKIN ONCE A WEEK 6 mL 0  . Dulaglutide 1.5 MG/0.5ML SOPN INJECT 1 PEN UNDER THE SKIN ONCE A WEEK 2 mL 2  . enoxaparin (LOVENOX) 80 MG/0.8ML injection INJECT 1 SYRINGE AS DIRECTED EVERY 12 HOURS. BEGIN 3 DAYS PRIOR TO SURGERY AND STOP 12 HOURS BEFORE SURGERY 16 mL 0  . ezetimibe-simvastatin (VYTORIN) 10-20 MG per tablet Take 1 tablet by mouth at bedtime.    [name] ezetimibe-simvastatin (VYTORIN) 10-20 MG tablet TAKE 1 TABLET BY MOUTH EVERY EVENING 90  tablet 4  . Insulin Glargine (BASAGLAR KWIKPEN) 100 UNIT/ML Inject 22 Units into the skin at bedtime.     . Insulin Glargine (BASAGLAR KWIKPEN) 100 UNIT/ML INJECT 18-24 UNITS UNDER THE SKIN PER DAY. 27 mL 3  . Insulin Pen Needle 32G X 5 MM MISC INJECT AS DIRECTED ONCE DAILY 100 each 1  . JARDIANCE 25 MG TABS tablet Take 25 mg by mouth daily.     [name] levothyroxine (SYNTHROID) 75 MCG tablet TAKE 1 TABLET BY MOUTH ONCE DAILY EVERY MORNING ON AN EMPTY STOMACH 90 tablet 4  . levothyroxine (SYNTHROID, LEVOTHROID) 75 MCG tablet Take 75 mcg by mouth daily before breakfast.    . metFORMIN (GLUCOPHAGE-XR) 500 MG 24 hr tablet Take 1,000 mg by mouth 2 (two) times daily.  4  . metFORMIN (GLUCOPHAGE-XR) 500 MG 24 hr tablet TAKE 2 TABLETS BY MOUTH TWO TIMES DAILY WITH FOOD 360 tablet 4  . NOVOTWIST PEN NEEDLE 32G X 5 MM MISC INJECT AS DIRECTED ONCE DAILY 100 each 0  . prochlorperazine (COMPAZINE) 10 MG tablet TAKE 1 TABLET BY MOUTH EVERY 6 HOURS AS NEEDED FOR NAUSEA & VOMITING 30 tablet 0  . rivaroxaban (XARELTO) 10 MG TABS tablet TAKE 1 TABLET BY MOUTH ONCE A DAY WITH FOOD 90 tablet 4  . TRULICITY 1.5 YC/1.4GY SOPN Inject 1.5 mg into the skin once a week. Sunday    . XARELTO 10 MG TABS tablet Take 10 mg by mouth daily.  No current facility-administered medications for this visit.   Facility-Administered Medications Ordered in Other Visits  Medication Dose Route Frequency Provider Last Rate Last Admin  . magnesium citrate solution 0.5 Bottle  0.5 Bottle Oral Once Raynelle Bring, MD         Allergies: No Known Allergies    Physical Exam: Blood pressure 116/61, pulse 90, temperature (!) 97.3 F (36.3 C), temperature source Tympanic, resp. rate 17, weight 197 lb 3.2 oz (89.4 kg), SpO2 98 %.    ECOG:  0    General appearance: Comfortable appearing without any discomfort Head: Normocephalic without any trauma Oropharynx: Mucous membranes are moist and pink without any thrush or ulcers. Eyes:  Pupils are equal and round reactive to light. Lymph nodes: No cervical, supraclavicular, inguinal or axillary lymphadenopathy.   Heart:regular rate and rhythm.  S1 and S2 without leg edema. Lung: Clear without any rhonchi or wheezes.  No dullness to percussion. Abdomin: Soft, nontender, nondistended with good bowel sounds.  No hepatosplenomegaly. Musculoskeletal: No joint deformity or effusion.  Full range of motion noted. Neurological: No deficits noted on motor, sensory and deep tendon reflex exam. Skin: No petechial rash or dryness.  Appeared moist.        Lab Results: Lab Results  Component Value Date   WBC 5.7 11/24/2020   HGB 13.8 11/24/2020   HCT 43.6 11/24/2020   MCV 87.9 11/24/2020   PLT 208 11/24/2020     Chemistry      Component Value Date/Time   NA 141 11/24/2020 0924   NA 136 11/18/2012 1331   K 4.8 11/24/2020 0924   K 4.7 11/18/2012 1331   CL 106 11/24/2020 0924   CL 98 11/18/2012 1331   CO2 23 11/24/2020 0924   CO2 27 11/18/2012 1331   BUN 31 (H) 11/24/2020 0924   BUN 24.6 11/18/2012 1331   CREATININE 1.84 (H) 11/24/2020 0924   CREATININE 1.7 (H) 11/18/2012 1331      Component Value Date/Time   CALCIUM 8.9 11/24/2020 0924   CALCIUM 9.9 11/18/2012 1331   ALKPHOS 88 11/24/2020 0924   ALKPHOS 117 11/18/2012 1331   AST 17 11/24/2020 0924   AST 15 11/18/2012 1331   ALT 21 11/24/2020 0924   ALT 23 11/18/2012 1331   BILITOT 0.6 11/24/2020 0924   BILITOT 0.59 11/18/2012 1331         Impression and Plan:   65 year old with:  1.  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   He is currently on adjuvant Pembrolizumab without any major complications.  Risks and benefits of continuing this therapy to complete a year were discussed.  Potential complications include nausea, fatigue, immune mediated complications among others.  I also recommend obtaining staging studies 6 months from his surgery which is  approaching that date at this time.  He is agreeable to proceed at this time we will consider imaging studies in July 2022 unless was done by [facility] urology.  2.  IV access: No issues reported with peripheral veins at this time.  He declined the option for a Port-A-Cath.  3.  Antiemetics: No nausea or vomiting reported at this time.  Compazine is available to him.  4.  Immune mediated complications: These were discussed today including pneumonitis, colitis and thyroid disease.  5.  Follow-up: In 6 weeks for the next Pembrolizumab infusion.  30  minutes were spent on this visit.  The time was dedicated to updating his disease status,  discussing treatment options and complications related to therapy.      Zola Button, MD 5/10/20228:14 AM

## 2021-01-23 ENCOUNTER — Other Ambulatory Visit (HOSPITAL_COMMUNITY): Payer: Self-pay

## 2021-01-24 ENCOUNTER — Other Ambulatory Visit (HOSPITAL_COMMUNITY): Payer: Self-pay

## 2021-01-25 ENCOUNTER — Other Ambulatory Visit (HOSPITAL_COMMUNITY): Payer: Self-pay

## 2021-01-25 DIAGNOSIS — D689 Coagulation defect, unspecified: Secondary | ICD-10-CM | POA: Diagnosis not present

## 2021-01-25 DIAGNOSIS — E7800 Pure hypercholesterolemia, unspecified: Secondary | ICD-10-CM | POA: Diagnosis not present

## 2021-01-25 DIAGNOSIS — Z23 Encounter for immunization: Secondary | ICD-10-CM | POA: Diagnosis not present

## 2021-01-25 DIAGNOSIS — Z0000 Encounter for general adult medical examination without abnormal findings: Secondary | ICD-10-CM | POA: Diagnosis not present

## 2021-01-25 DIAGNOSIS — E1169 Type 2 diabetes mellitus with other specified complication: Secondary | ICD-10-CM | POA: Diagnosis not present

## 2021-01-25 DIAGNOSIS — Z125 Encounter for screening for malignant neoplasm of prostate: Secondary | ICD-10-CM | POA: Diagnosis not present

## 2021-01-25 DIAGNOSIS — L821 Other seborrheic keratosis: Secondary | ICD-10-CM | POA: Diagnosis not present

## 2021-01-25 DIAGNOSIS — E032 Hypothyroidism due to medicaments and other exogenous substances: Secondary | ICD-10-CM | POA: Diagnosis not present

## 2021-01-25 MED ORDER — TRULICITY 1.5 MG/0.5ML ~~LOC~~ SOAJ
SUBCUTANEOUS | 4 refills | Status: DC
  Filled 2021-02-24 (×2): qty 6, 84d supply, fill #0
  Filled 2021-05-15: qty 6, 84d supply, fill #1
  Filled 2021-08-15: qty 6, 84d supply, fill #2
  Filled 2021-12-08: qty 6, 84d supply, fill #3

## 2021-01-25 MED ORDER — LEVOTHYROXINE SODIUM 75 MCG PO TABS
ORAL_TABLET | ORAL | 4 refills | Status: DC
  Filled 2021-01-25: qty 90, 90d supply, fill #0
  Filled 2021-04-17: qty 90, 90d supply, fill #1
  Filled 2021-07-24: qty 90, 90d supply, fill #2
  Filled 2021-10-30: qty 90, 90d supply, fill #3
  Filled 2022-01-22: qty 90, 90d supply, fill #4

## 2021-01-25 MED ORDER — JARDIANCE 25 MG PO TABS
ORAL_TABLET | ORAL | 4 refills | Status: DC
  Filled 2021-01-25: qty 90, 90d supply, fill #0
  Filled 2021-04-17: qty 90, 90d supply, fill #1
  Filled 2021-07-24: qty 90, 90d supply, fill #2

## 2021-01-25 MED ORDER — METFORMIN HCL ER 500 MG PO TB24
ORAL_TABLET | ORAL | 4 refills | Status: DC
  Filled 2021-02-13: qty 360, 90d supply, fill #0
  Filled 2021-05-15: qty 360, 90d supply, fill #1

## 2021-01-25 MED ORDER — LEVEMIR FLEXTOUCH 100 UNIT/ML ~~LOC~~ SOPN
PEN_INJECTOR | SUBCUTANEOUS | 4 refills | Status: DC
  Filled 2021-02-13: qty 9, 50d supply, fill #0
  Filled 2021-04-10: qty 9, 50d supply, fill #1
  Filled 2021-05-20: qty 9, 50d supply, fill #2
  Filled 2021-07-12: qty 9, 50d supply, fill #3

## 2021-01-25 MED ORDER — XARELTO 10 MG PO TABS: ORAL_TABLET | ORAL | 4 refills | Status: DC

## 2021-01-26 ENCOUNTER — Other Ambulatory Visit: Payer: 59

## 2021-01-26 ENCOUNTER — Ambulatory Visit: Payer: 59 | Admitting: Oncology

## 2021-01-26 ENCOUNTER — Ambulatory Visit: Payer: 59

## 2021-01-27 DIAGNOSIS — C641 Malignant neoplasm of right kidney, except renal pelvis: Secondary | ICD-10-CM | POA: Diagnosis not present

## 2021-02-02 DIAGNOSIS — I2510 Atherosclerotic heart disease of native coronary artery without angina pectoris: Secondary | ICD-10-CM | POA: Diagnosis not present

## 2021-02-02 DIAGNOSIS — R911 Solitary pulmonary nodule: Secondary | ICD-10-CM | POA: Diagnosis not present

## 2021-02-02 DIAGNOSIS — N281 Cyst of kidney, acquired: Secondary | ICD-10-CM | POA: Diagnosis not present

## 2021-02-02 DIAGNOSIS — C641 Malignant neoplasm of right kidney, except renal pelvis: Secondary | ICD-10-CM | POA: Diagnosis not present

## 2021-02-08 DIAGNOSIS — C641 Malignant neoplasm of right kidney, except renal pelvis: Secondary | ICD-10-CM | POA: Diagnosis not present

## 2021-02-13 ENCOUNTER — Other Ambulatory Visit (HOSPITAL_COMMUNITY): Payer: Self-pay

## 2021-02-14 ENCOUNTER — Other Ambulatory Visit (HOSPITAL_COMMUNITY): Payer: Self-pay

## 2021-02-14 MED ORDER — EZETIMIBE-SIMVASTATIN 10-20 MG PO TABS
ORAL_TABLET | ORAL | 1 refills | Status: DC
  Filled 2021-02-14: qty 90, 90d supply, fill #0
  Filled 2021-05-15: qty 90, 90d supply, fill #1

## 2021-02-16 ENCOUNTER — Ambulatory Visit: Payer: 59 | Admitting: Oncology

## 2021-02-16 ENCOUNTER — Ambulatory Visit: Payer: 59

## 2021-02-16 ENCOUNTER — Other Ambulatory Visit: Payer: 59

## 2021-02-22 ENCOUNTER — Other Ambulatory Visit (HOSPITAL_COMMUNITY): Payer: Self-pay

## 2021-02-23 ENCOUNTER — Inpatient Hospital Stay: Payer: 59

## 2021-02-23 ENCOUNTER — Other Ambulatory Visit: Payer: Self-pay

## 2021-02-23 ENCOUNTER — Inpatient Hospital Stay: Payer: 59 | Attending: Oncology

## 2021-02-23 ENCOUNTER — Inpatient Hospital Stay (HOSPITAL_BASED_OUTPATIENT_CLINIC_OR_DEPARTMENT_OTHER): Payer: 59 | Admitting: Oncology

## 2021-02-23 VITALS — BP 106/71 | HR 74 | Temp 97.0°F | Resp 20 | Wt 197.1 lb

## 2021-02-23 DIAGNOSIS — C641 Malignant neoplasm of right kidney, except renal pelvis: Secondary | ICD-10-CM | POA: Diagnosis not present

## 2021-02-23 DIAGNOSIS — Z5112 Encounter for antineoplastic immunotherapy: Secondary | ICD-10-CM | POA: Insufficient documentation

## 2021-02-23 DIAGNOSIS — Z79899 Other long term (current) drug therapy: Secondary | ICD-10-CM | POA: Insufficient documentation

## 2021-02-23 LAB — CBC WITH DIFFERENTIAL (CANCER CENTER ONLY)
Abs Immature Granulocytes: 0.02 10*3/uL (ref 0.00–0.07)
Basophils Absolute: 0 10*3/uL (ref 0.0–0.1)
Basophils Relative: 1 %
Eosinophils Absolute: 0.2 10*3/uL (ref 0.0–0.5)
Eosinophils Relative: 3 %
HCT: 42 % (ref 39.0–52.0)
Hemoglobin: 13.9 g/dL (ref 13.0–17.0)
Immature Granulocytes: 0 %
Lymphocytes Relative: 16 %
Lymphs Abs: 0.8 10*3/uL (ref 0.7–4.0)
MCH: 28.7 pg (ref 26.0–34.0)
MCHC: 33.1 g/dL (ref 30.0–36.0)
MCV: 86.6 fL (ref 80.0–100.0)
Monocytes Absolute: 0.5 10*3/uL (ref 0.1–1.0)
Monocytes Relative: 10 %
Neutro Abs: 3.6 10*3/uL (ref 1.7–7.7)
Neutrophils Relative %: 70 %
Platelet Count: 222 10*3/uL (ref 150–400)
RBC: 4.85 MIL/uL (ref 4.22–5.81)
RDW: 12.6 % (ref 11.5–15.5)
WBC Count: 5.1 10*3/uL (ref 4.0–10.5)
nRBC: 0 % (ref 0.0–0.2)

## 2021-02-23 LAB — CMP (CANCER CENTER ONLY)
ALT: 14 U/L (ref 0–44)
AST: 14 U/L — ABNORMAL LOW (ref 15–41)
Albumin: 3.9 g/dL (ref 3.5–5.0)
Alkaline Phosphatase: 92 U/L (ref 38–126)
Anion gap: 10 (ref 5–15)
BUN: 36 mg/dL — ABNORMAL HIGH (ref 8–23)
CO2: 23 mmol/L (ref 22–32)
Calcium: 9.3 mg/dL (ref 8.9–10.3)
Chloride: 107 mmol/L (ref 98–111)
Creatinine: 2.09 mg/dL — ABNORMAL HIGH (ref 0.61–1.24)
GFR, Estimated: 34 mL/min — ABNORMAL LOW (ref >60)
Glucose, Bld: 140 mg/dL — ABNORMAL HIGH (ref 70–99)
Potassium: 4.8 mmol/L (ref 3.5–5.1)
Sodium: 140 mmol/L (ref 135–145)
Total Bilirubin: 0.6 mg/dL (ref 0.3–1.2)
Total Protein: 7.1 g/dL (ref 6.5–8.1)

## 2021-02-23 LAB — TSH: TSH: 1.712 u[IU]/mL (ref 0.320–4.118)

## 2021-02-23 MED ORDER — SODIUM CHLORIDE 0.9 % IV SOLN
Freq: Once | INTRAVENOUS | Status: AC
  Filled 2021-02-23: qty 250

## 2021-02-23 MED ORDER — SODIUM CHLORIDE 0.9 % IV SOLN
400.0000 mg | Freq: Once | INTRAVENOUS | Status: AC
  Administered 2021-02-23: 400 mg via INTRAVENOUS
  Filled 2021-02-23: qty 16

## 2021-02-23 NOTE — Addendum Note (Signed)
Addended by: Wyatt Portela on: 02/23/2021 11:53 AM   Modules accepted: Orders

## 2021-02-23 NOTE — Progress Notes (Signed)
Hematology and Oncology Follow Up Visit  [name] [id number] May 28, 1956 65 y.o. 02/23/2021 11:14 AM [name], MDRoss, Clarence Luo, MD   Principle Diagnosis: 65 year old man with T3AN0  clear-cell renal cell carcinoma with rhabdoid features and invasion into perinephric fat diagnosed in 2021.   Prior Therapy:  He is status post laparoscopic radical nephrectomy and retroperitoneal lymph node dissection done on August 05, 2020.  He was found to have T3a N0  Current therapy: Pembrolizumab 200 mg every 3 weeks for adjuvant purposes started on October 12, 2020.  He is currently on 400 mg every 6 weeks to complete 1 year of therapy.  He is here for the next cycle.  Interim History: [name] presents today for return evaluation.  Since the last visit, he reports no major changes in his health.  He continues to tolerate Pembrolizumab without any major complaints.  He denies any nausea, vomiting or abdominal pain.  Denies recent hospitalization or illnesses.  He denies any skin rashes or lesions.     Medications: Unchanged on review. Current Outpatient Medications  Medication Sig Dispense Refill   COVID-19 mRNA vaccine, Moderna, 100 MCG/0.5ML injection Inject into the muscle. 0.25 mL 0   COVID-19 mRNA vaccine, Pfizer, 30 MCG/0.3ML injection INJECT AS DIRECTED .3 mL 0   Dulaglutide (TRULICITY) 1.5 HT/3.4KA SOPN inject 1 pen under the skin once a week 6 mL 4   Dulaglutide 1.5 MG/0.5ML SOPN INJECT 1 PEN UNDER THE SKIN ONCE A WEEK 6 mL 0   Dulaglutide 1.5 MG/0.5ML SOPN INJECT 1 PEN UNDER THE SKIN ONCE A WEEK 6 mL 0   Dulaglutide 1.5 MG/0.5ML SOPN INJECT 1 PEN UNDER THE SKIN ONCE A WEEK 2 mL 2   empagliflozin (JARDIANCE) 25 MG TABS tablet Take 1 tablet by mouth Once a day 90 tablet 4   enoxaparin (LOVENOX) 80 MG/0.8ML injection INJECT 1 SYRINGE AS DIRECTED EVERY 12 HOURS. BEGIN 3 DAYS PRIOR TO SURGERY AND STOP 12 HOURS BEFORE SURGERY 16 mL 0   ezetimibe-simvastatin (VYTORIN) 10-20  MG per tablet Take 1 tablet by mouth at bedtime.     ezetimibe-simvastatin (VYTORIN) 10-20 MG tablet TAKE 1 TABLET BY MOUTH EVERY EVENING 90 tablet 4   ezetimibe-simvastatin (VYTORIN) 10-20 MG tablet Take 1 tablet by mouth once daily at night. 90 tablet 1   insulin detemir (LEVEMIR FLEXTOUCH) 100 UNIT/ML FlexPen Inject 18 units Subcutaneous once a day 9 mL 4   Insulin Glargine (BASAGLAR KWIKPEN) 100 UNIT/ML Inject 22 Units into the skin at bedtime.      Insulin Glargine (BASAGLAR KWIKPEN) 100 UNIT/ML INJECT 18-24 UNITS UNDER THE SKIN PER DAY. 27 mL 3   Insulin Pen Needle 32G X 5 MM MISC INJECT AS DIRECTED ONCE DAILY 100 each 1   JARDIANCE 25 MG TABS tablet Take 25 mg by mouth daily.      levothyroxine (SYNTHROID) 75 MCG tablet TAKE 1 TABLET BY MOUTH ONCE DAILY EVERY MORNING ON AN EMPTY STOMACH 90 tablet 4   levothyroxine (SYNTHROID) 75 MCG tablet Take 1 tablet by mouth every morning on an empty stomach 90 tablet 4   levothyroxine (SYNTHROID, LEVOTHROID) 75 MCG tablet Take 75 mcg by mouth daily before breakfast.     metFORMIN (GLUCOPHAGE-XR) 500 MG 24 hr tablet Take 1,000 mg by mouth 2 (two) times daily.  4   metFORMIN (GLUCOPHAGE-XR) 500 MG 24 hr tablet TAKE 2 TABLETS BY MOUTH TWO TIMES DAILY WITH FOOD 360 tablet 4   metFORMIN (GLUCOPHAGE-XR) 500 MG 24  hr tablet Take 2 tablets by mouth with food Twice a day 360 tablet 4   NOVOTWIST PEN NEEDLE 32G X 5 MM MISC INJECT AS DIRECTED ONCE DAILY 100 each 0   prochlorperazine (COMPAZINE) 10 MG tablet TAKE 1 TABLET BY MOUTH EVERY 6 HOURS AS NEEDED FOR NAUSEA & VOMITING 30 tablet 0   rivaroxaban (XARELTO) 10 MG TABS tablet TAKE 1 TABLET BY MOUTH ONCE A DAY WITH FOOD 90 tablet 4   rivaroxaban (XARELTO) 10 MG TABS tablet Take 1 tablet with food by mouth Once a day 90 tablet 4   TRULICITY 1.5 QQ/2.2LN SOPN Inject 1.5 mg into the skin once a week. Sunday     XARELTO 10 MG TABS tablet Take 10 mg by mouth daily.     No current facility-administered medications  for this visit.   Facility-Administered Medications Ordered in Other Visits  Medication Dose Route Frequency Provider Last Rate Last Admin   magnesium citrate solution 0.5 Bottle  0.5 Bottle Oral Once Raynelle Bring, MD         Allergies: No Known Allergies    Physical Exam:  Blood pressure 106/71, pulse 74, temperature (!) 97 F (36.1 C), temperature source Tympanic, resp. rate 20, weight 197 lb 1.6 oz (89.4 kg), SpO2 97 %.    ECOG:  0   General appearance: Alert, awake without any distress. Head: Atraumatic without abnormalities Oropharynx: Without any thrush or ulcers. Eyes: No scleral icterus. Lymph nodes: No lymphadenopathy noted in the cervical, supraclavicular, or axillary nodes Heart:regular rate and rhythm, without any murmurs or gallops.   Lung: Clear to auscultation without any rhonchi, wheezes or dullness to percussion. Abdomin: Soft, nontender without any shifting dullness or ascites. Musculoskeletal: No clubbing or cyanosis. Neurological: No motor or sensory deficits. Skin: No rashes or lesions. Psychiatric: Mood and affect appeared normal.       Lab Results: Lab Results  Component Value Date   WBC 8.6 01/11/2021   HGB 14.5 01/11/2021   HCT 45.1 01/11/2021   MCV 87.4 01/11/2021   PLT 243 01/11/2021     Chemistry      Component Value Date/Time   NA 138 01/11/2021 0822   NA 136 11/18/2012 1331   K 5.2 (H) 01/11/2021 0822   K 4.7 11/18/2012 1331   CL 105 01/11/2021 0822   CL 98 11/18/2012 1331   CO2 21 (L) 01/11/2021 0822   CO2 27 11/18/2012 1331   BUN 36 (H) 01/11/2021 0822   BUN 24.6 11/18/2012 1331   CREATININE 1.93 (H) 01/11/2021 0822   CREATININE 1.7 (H) 11/18/2012 1331      Component Value Date/Time   CALCIUM 9.4 01/11/2021 0822   CALCIUM 9.9 11/18/2012 1331   ALKPHOS 91 01/11/2021 0822   ALKPHOS 117 11/18/2012 1331   AST 16 01/11/2021 0822   AST 15 11/18/2012 1331   ALT 14 01/11/2021 0822   ALT 23 11/18/2012 1331   BILITOT  0.6 01/11/2021 0822   BILITOT 0.59 11/18/2012 1331         Impression and Plan:   65 year old with:   1.  Kidney cancer diagnosed in December 2021.  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    His disease status was updated at this time and treatment choices were discussed.  He continues to tolerate Pembrolizumab adjuvantly without any major complications.  Risks and benefits of continuing this treatment were reviewed.  Progression free survival advantage has been documented and continues  to be validated with adjuvant immunotherapy.  Complications: Immune mediated issues as well as GI toxicities were reviewed.  He completed staging scans in June 2022 without any evidence of relapsed disease.  After discussion today, he is agreeable to continue.  He will continue to receive Pembrolizumab every 6 weeks until February 2023.   2.  IV access: Peripheral veins are currently in use without any issues.   3.  Antiemetics: Compazine is available to him without any nausea or vomiting.   4.  Immune mediated complications: He has not experienced any complications.  These include pneumonitis, colitis, hepatitis among others were reiterated.   5.  Follow-up: He will return in 6 weeks for the next cycle of therapy.   30  minutes were dedicated to this encounter.  The time was spent on reviewing disease status, treatment choices and future plan of care discussion.        Zola Button, MD 6/22/202211:14 AM

## 2021-02-23 NOTE — Progress Notes (Signed)
Per Dr. Alen Blew, ok to treat with elevated creatinine.

## 2021-02-23 NOTE — Patient Instructions (Signed)
[facility]   Discharge Instructions: Thank you for choosing [facility] to provide your oncology and hematology care.   If you have a lab appointment with the [facility], please go directly to the [facility] and check in at the registration area.   Wear comfortable clothing and clothing appropriate for easy access to any Portacath or PICC line.   We strive to give you quality time with your provider. You may need to reschedule your appointment if you arrive late (15 or more minutes).  Arriving late affects you and other patients whose appointments are after yours.  Also, if you miss three or more appointments without notifying the office, you may be dismissed from the clinic at the provider's discretion.      For prescription refill requests, have your pharmacy contact our office and allow 72 hours for refills to be completed.    Today you received the following chemotherapy and/or immunotherapy agents: pembrolizumab.      To help prevent nausea and vomiting after your treatment, we encourage you to take your nausea medication as directed.  BELOW ARE SYMPTOMS THAT SHOULD BE REPORTED IMMEDIATELY: *FEVER GREATER THAN 100.4 F (38 C) OR HIGHER *CHILLS OR SWEATING *NAUSEA AND VOMITING THAT IS NOT CONTROLLED WITH YOUR NAUSEA MEDICATION *UNUSUAL SHORTNESS OF BREATH *UNUSUAL BRUISING OR BLEEDING *URINARY PROBLEMS (pain or burning when urinating, or frequent urination) *BOWEL PROBLEMS (unusual diarrhea, constipation, pain near the anus) TENDERNESS IN MOUTH AND THROAT WITH OR WITHOUT PRESENCE OF ULCERS (sore throat, sores in mouth, or a toothache) UNUSUAL RASH, SWELLING OR PAIN  UNUSUAL VAGINAL DISCHARGE OR ITCHING   Items with * indicate a potential emergency and should be followed up as soon as possible or go to the Emergency Department if any problems should occur.  Please show the CHEMOTHERAPY ALERT CARD or IMMUNOTHERAPY ALERT CARD at  check-in to the Emergency Department and triage nurse.  Should you have questions after your visit or need to cancel or reschedule your appointment, please contact [facility]  Dept: [phone]  and follow the prompts.  Office hours are 8:00 a.m. to 4:30 p.m. Monday - Friday. Please note that voicemails left after 4:00 p.m. may not be returned until the following business day.  We are closed weekends and major holidays. You have access to a nurse at all times for urgent questions. Please call the main number to the clinic Dept: [phone] and follow the prompts.   For any non-urgent questions, you may also contact your provider using MyChart. We now offer e-Visits for anyone 34 and older to request care online for non-urgent symptoms. For details visit [email or web address].   Also download the MyChart app! Go to the app store, search "MyChart", open the app, select [facility], and log in with your MyChart username and password.  Due to Covid, a mask is required upon entering the hospital/clinic. If you do not have a mask, one will be given to you upon arrival. For doctor visits, patients may have 1 support person aged 61 or older with them. For treatment visits, patients cannot have anyone with them due to current Covid guidelines and our immunocompromised population.

## 2021-02-24 ENCOUNTER — Other Ambulatory Visit (HOSPITAL_COMMUNITY): Payer: Self-pay

## 2021-02-25 ENCOUNTER — Other Ambulatory Visit (HOSPITAL_COMMUNITY): Payer: Self-pay

## 2021-02-27 ENCOUNTER — Other Ambulatory Visit (HOSPITAL_COMMUNITY): Payer: Self-pay

## 2021-02-28 ENCOUNTER — Other Ambulatory Visit (HOSPITAL_COMMUNITY): Payer: Self-pay

## 2021-02-28 ENCOUNTER — Encounter: Payer: Self-pay | Admitting: Oncology

## 2021-02-28 MED ORDER — XARELTO 10 MG PO TABS
10.0000 mg | ORAL_TABLET | Freq: Every day | ORAL | 4 refills | Status: DC
  Filled 2021-02-28: qty 90, 90d supply, fill #0
  Filled 2021-05-29: qty 90, 90d supply, fill #1
  Filled 2021-08-28: qty 90, 90d supply, fill #2
  Filled 2021-11-20: qty 90, 90d supply, fill #3
  Filled 2022-02-19: qty 90, 90d supply, fill #4

## 2021-03-07 ENCOUNTER — Other Ambulatory Visit (HOSPITAL_COMMUNITY): Payer: Self-pay

## 2021-03-08 ENCOUNTER — Other Ambulatory Visit (HOSPITAL_COMMUNITY): Payer: Self-pay

## 2021-03-08 MED ORDER — COMFORT EZ PEN NEEDLES 32G X 5 MM MISC
1 refills | Status: DC
  Filled 2021-03-08: qty 100, 90d supply, fill #0
  Filled 2021-06-16: qty 100, 90d supply, fill #1

## 2021-03-09 ENCOUNTER — Other Ambulatory Visit (HOSPITAL_COMMUNITY): Payer: Self-pay

## 2021-03-10 ENCOUNTER — Other Ambulatory Visit (HOSPITAL_COMMUNITY): Payer: Self-pay

## 2021-03-14 ENCOUNTER — Other Ambulatory Visit (HOSPITAL_COMMUNITY): Payer: Self-pay

## 2021-03-16 DIAGNOSIS — N183 Chronic kidney disease, stage 3 unspecified: Secondary | ICD-10-CM | POA: Diagnosis not present

## 2021-03-16 DIAGNOSIS — R030 Elevated blood-pressure reading, without diagnosis of hypertension: Secondary | ICD-10-CM | POA: Diagnosis not present

## 2021-03-16 DIAGNOSIS — N184 Chronic kidney disease, stage 4 (severe): Secondary | ICD-10-CM | POA: Diagnosis not present

## 2021-03-16 DIAGNOSIS — D631 Anemia in chronic kidney disease: Secondary | ICD-10-CM | POA: Diagnosis not present

## 2021-03-16 DIAGNOSIS — Z85528 Personal history of other malignant neoplasm of kidney: Secondary | ICD-10-CM | POA: Diagnosis not present

## 2021-03-16 DIAGNOSIS — N2581 Secondary hyperparathyroidism of renal origin: Secondary | ICD-10-CM | POA: Diagnosis not present

## 2021-04-06 ENCOUNTER — Inpatient Hospital Stay: Payer: 59 | Admitting: Physician Assistant

## 2021-04-06 ENCOUNTER — Inpatient Hospital Stay: Payer: 59 | Attending: Physician Assistant

## 2021-04-06 ENCOUNTER — Other Ambulatory Visit: Payer: Self-pay

## 2021-04-06 ENCOUNTER — Inpatient Hospital Stay: Payer: 59

## 2021-04-06 VITALS — BP 150/80 | HR 80 | Resp 17

## 2021-04-06 VITALS — BP 97/68 | HR 70 | Temp 98.1°F | Resp 17 | Ht 75.0 in | Wt 197.7 lb

## 2021-04-06 DIAGNOSIS — Z79899 Other long term (current) drug therapy: Secondary | ICD-10-CM | POA: Diagnosis not present

## 2021-04-06 DIAGNOSIS — Z5112 Encounter for antineoplastic immunotherapy: Secondary | ICD-10-CM | POA: Insufficient documentation

## 2021-04-06 DIAGNOSIS — C641 Malignant neoplasm of right kidney, except renal pelvis: Secondary | ICD-10-CM | POA: Diagnosis not present

## 2021-04-06 DIAGNOSIS — R7989 Other specified abnormal findings of blood chemistry: Secondary | ICD-10-CM

## 2021-04-06 LAB — CBC WITH DIFFERENTIAL (CANCER CENTER ONLY)
Abs Immature Granulocytes: 0.02 10*3/uL (ref 0.00–0.07)
Basophils Absolute: 0 10*3/uL (ref 0.0–0.1)
Basophils Relative: 0 %
Eosinophils Absolute: 0.3 10*3/uL (ref 0.0–0.5)
Eosinophils Relative: 5 %
HCT: 40.7 % (ref 39.0–52.0)
Hemoglobin: 13.6 g/dL (ref 13.0–17.0)
Immature Granulocytes: 0 %
Lymphocytes Relative: 12 %
Lymphs Abs: 0.8 10*3/uL (ref 0.7–4.0)
MCH: 28.7 pg (ref 26.0–34.0)
MCHC: 33.4 g/dL (ref 30.0–36.0)
MCV: 85.9 fL (ref 80.0–100.0)
Monocytes Absolute: 0.5 10*3/uL (ref 0.1–1.0)
Monocytes Relative: 7 %
Neutro Abs: 4.9 10*3/uL (ref 1.7–7.7)
Neutrophils Relative %: 76 %
Platelet Count: 235 10*3/uL (ref 150–400)
RBC: 4.74 MIL/uL (ref 4.22–5.81)
RDW: 12.2 % (ref 11.5–15.5)
WBC Count: 6.5 10*3/uL (ref 4.0–10.5)
nRBC: 0 % (ref 0.0–0.2)

## 2021-04-06 LAB — CMP (CANCER CENTER ONLY)
ALT: 14 U/L (ref 0–44)
AST: 10 U/L — ABNORMAL LOW (ref 15–41)
Albumin: 3.7 g/dL (ref 3.5–5.0)
Alkaline Phosphatase: 94 U/L (ref 38–126)
Anion gap: 12 (ref 5–15)
BUN: 28 mg/dL — ABNORMAL HIGH (ref 8–23)
CO2: 23 mmol/L (ref 22–32)
Calcium: 9.4 mg/dL (ref 8.9–10.3)
Chloride: 103 mmol/L (ref 98–111)
Creatinine: 2.25 mg/dL — ABNORMAL HIGH (ref 0.61–1.24)
GFR, Estimated: 31 mL/min — ABNORMAL LOW (ref >60)
Glucose, Bld: 348 mg/dL — ABNORMAL HIGH (ref 70–99)
Potassium: 5 mmol/L (ref 3.5–5.1)
Sodium: 138 mmol/L (ref 135–145)
Total Bilirubin: 0.6 mg/dL (ref 0.3–1.2)
Total Protein: 6.8 g/dL (ref 6.5–8.1)

## 2021-04-06 MED ORDER — SODIUM CHLORIDE 0.9 % IV SOLN
INTRAVENOUS | Status: DC
  Filled 2021-04-06 (×2): qty 250

## 2021-04-06 MED ORDER — SODIUM CHLORIDE 0.9 % IV SOLN
Freq: Once | INTRAVENOUS | Status: AC
  Filled 2021-04-06: qty 250

## 2021-04-06 MED ORDER — PEMBROLIZUMAB CHEMO INJECTION 100 MG/4ML
400.0000 mg | Freq: Once | INTRAVENOUS | Status: AC
Start: 2021-04-06 — End: 2021-04-06
  Administered 2021-04-06: 400 mg via INTRAVENOUS
  Filled 2021-04-06: qty 16

## 2021-04-06 NOTE — Progress Notes (Signed)
Per Dede Query PA-C, ok to treat with elevated creatinine and serum glucose. Will give 1L NS bolus over 2 hours per orders.

## 2021-04-06 NOTE — Progress Notes (Signed)
Hematology and Oncology Follow Up Visit  [name] [id number] 13-Nov-1955 65 y.o. 04/06/2021 1:28 PM [name], MDRoss, [name], MD   Principle Diagnosis: 65 year old man with T3AN0  clear-cell renal cell carcinoma with rhabdoid features and invasion into perinephric fat diagnosed in 2021.   Prior Therapy:  He is status post laparoscopic radical nephrectomy and retroperitoneal lymph node dissection done on August 05, 2020.  He was found to have T3a N0  Current therapy: Pembrolizumab 200 mg every 3 weeks for adjuvant purposes started on October 12, 2020.  He is currently on 400 mg every 6 weeks to complete 1 year of therapy.  He is here for Cycle 6.   Interim History: [name] presents today for return evaluation prior to Cycle 6 of Pembrolizumab. Patient continues to tolerate treatment without any significant limitations. He reports mild fatigue for a few days following treatment but he continues to complete all his ADLS. He has a good appetite without any weight change. He denies any nausea, vomiting or abdominal pain. His bowel movements are regular without any diarrhea or constipation. Patient denies easy bruising or signs of bleeding. He denies any fevers, chills, night sweats, shortness of breath, chest pain, cough, rash or other skin lesion. He has no other complaints. .  Medications: Unchanged on review. Current Outpatient Medications  Medication Sig Dispense Refill   COVID-19 mRNA vaccine, Moderna, 100 MCG/0.5ML injection Inject into the muscle. 0.25 mL 0   COVID-19 mRNA vaccine, Pfizer, 30 MCG/0.3ML injection INJECT AS DIRECTED .3 mL 0   Dulaglutide (TRULICITY) 1.5 JK/9.3OI SOPN inject 1 pen under the skin once a week 6 mL 4   empagliflozin (JARDIANCE) 25 MG TABS tablet Take 1 tablet by mouth Once a day 90 tablet 4   ezetimibe-simvastatin (VYTORIN) 10-20 MG tablet Take 1 tablet by mouth once daily at night. 90 tablet 1   insulin detemir (LEVEMIR FLEXTOUCH) 100  UNIT/ML FlexPen Inject 18 units Subcutaneous once a day 9 mL 4   Insulin Pen Needle (COMFORT EZ PEN NEEDLES) 32G X 5 MM MISC Inject as directed once daily 100 each 1   Insulin Pen Needle 32G X 5 MM MISC INJECT AS DIRECTED ONCE DAILY 100 each 1   levothyroxine (SYNTHROID) 75 MCG tablet Take 1 tablet by mouth every morning on an empty stomach 90 tablet 4   metFORMIN (GLUCOPHAGE-XR) 500 MG 24 hr tablet Take 2 tablets by mouth with food Twice a day 360 tablet 4   NOVOTWIST PEN NEEDLE 32G X 5 MM MISC INJECT AS DIRECTED ONCE DAILY 100 each 0   rivaroxaban (XARELTO) 10 MG TABS tablet Take 1 tablet (10 mg total) by mouth daily with food 90 tablet 4   No current facility-administered medications for this visit.   Facility-Administered Medications Ordered in Other Visits  Medication Dose Route Frequency Provider Last Rate Last Admin   magnesium citrate solution 0.5 Bottle  0.5 Bottle Oral Once Raynelle Bring, MD         Allergies: No Known Allergies    Physical Exam:  There were no vitals taken for this visit. ECOG:  0 General appearance: Alert, awake without any distress. Head: Atraumatic without abnormalities Oropharynx: Without any thrush or ulcers. Eyes: No scleral icterus. Lymph nodes: No lymphadenopathy noted in the cervical, supraclavicular, or axillary nodes Heart:regular rate and rhythm, without any murmurs or gallops.   Lung: Clear to auscultation without any rhonchi, wheezes or dullness to percussion. Abdomin: Soft, nontender without any shifting dullness or  ascites. Musculoskeletal: No clubbing or cyanosis. Neurological: No motor or sensory deficits. Skin: No rashes or lesions. Psychiatric: Mood and affect appeared normal.   Lab Results: Lab Results  Component Value Date   WBC 6.5 04/06/2021   HGB 13.6 04/06/2021   HCT 40.7 04/06/2021   MCV 85.9 04/06/2021   PLT 235 04/06/2021     Chemistry      Component Value Date/Time   NA 140 02/23/2021 1128   NA 136  11/18/2012 1331   K 4.8 02/23/2021 1128   K 4.7 11/18/2012 1331   CL 107 02/23/2021 1128   CL 98 11/18/2012 1331   CO2 23 02/23/2021 1128   CO2 27 11/18/2012 1331   BUN 36 (H) 02/23/2021 1128   BUN 24.6 11/18/2012 1331   CREATININE 2.09 (H) 02/23/2021 1128   CREATININE 1.7 (H) 11/18/2012 1331      Component Value Date/Time   CALCIUM 9.3 02/23/2021 1128   CALCIUM 9.9 11/18/2012 1331   ALKPHOS 92 02/23/2021 1128   ALKPHOS 117 11/18/2012 1331   AST 14 (L) 02/23/2021 1128   AST 15 11/18/2012 1331   ALT 14 02/23/2021 1128   ALT 23 11/18/2012 1331   BILITOT 0.6 02/23/2021 1128   BILITOT 0.59 11/18/2012 1331         Impression and Plan:   65 year old with:   1.  Kidney cancer diagnosed in December 2021.  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   [name] returns for Cycle 6 of Pembrolizumab. He continues to tolerate treatment without any difficulties. Patient will proceed with treatment today as planned. He will return in 6 weeks with an office visit and labs prior to Cycle 7. He will continue to receive Pembrolizumab every 6 weeks until February 2023.   2.  IV access: Peripheral veins are currently in use without any issues.   3.  Antiemetics: Compazine is available to him without any nausea or vomiting.   4.  Immune mediated complications: He has not experienced any complications.  These include pneumonitis, colitis, hepatitis among others were reiterated.   5. Chronic Kidney Disease stage 3. He is under the care of Dr. Gean Quint at [facility], most recently evaluated on 03/23/2021. Secondary to solitary kidney and diabetic kidney disease. Today's creatinine level is 2.25. Gave 1 L of IV NS fluids over two hours  6. Hyperglycemia with T2DM: Today's glucose level was 348. Patient reports he has been compliant with his medications but ate lunch right before his labs today. After IV fluids, repeat blood glucose level was 220. I  advised patient to monitor glucose levels at home and follow up with PCP as needed.   7.Follow-up: He will return in 6 weeks prior to Cycle 7 of Pembroilzumab  Patient expressed understanding and satisfaction with the plan provided.   I have spent a total of 25 minutes minutes of face-to-face and non-face-to-face time, preparing to see the patient, obtaining and/or reviewing separately obtained history, performing a medically appropriate examination, counseling and educating the patient, documenting clinical information in the electronic health record,and care coordination.   Lincoln Brigham, PA-C Hematology and [place] at [facility]

## 2021-04-06 NOTE — Patient Instructions (Signed)
[facility]COLOGY   Discharge Instructions: Thank you for choosing [facility] to provide your oncology and hematology care.   If you have a lab appointment with the [facility], please go directly to the [facility] and check in at the registration area.   Wear comfortable clothing and clothing appropriate for easy access to any Portacath or PICC line.   We strive to give you quality time with your provider. You may need to reschedule your appointment if you arrive late (15 or more minutes).  Arriving late affects you and other patients whose appointments are after yours.  Also, if you miss three or more appointments without notifying the office, you may be dismissed from the clinic at the provider's discretion.      For prescription refill requests, have your pharmacy contact our office and allow 72 hours for refills to be completed.    Today you received the following chemotherapy and/or immunotherapy agents: pembrolizumab.      To help prevent nausea and vomiting after your treatment, we encourage you to take your nausea medication as directed.  BELOW ARE SYMPTOMS THAT SHOULD BE REPORTED IMMEDIATELY: *FEVER GREATER THAN 100.4 F (38 C) OR HIGHER *CHILLS OR SWEATING *NAUSEA AND VOMITING THAT IS NOT CONTROLLED WITH YOUR NAUSEA MEDICATION *UNUSUAL SHORTNESS OF BREATH *UNUSUAL BRUISING OR BLEEDING *URINARY PROBLEMS (pain or burning when urinating, or frequent urination) *BOWEL PROBLEMS (unusual diarrhea, constipation, pain near the anus) TENDERNESS IN MOUTH AND THROAT WITH OR WITHOUT PRESENCE OF ULCERS (sore throat, sores in mouth, or a toothache) UNUSUAL RASH, SWELLING OR PAIN  UNUSUAL VAGINAL DISCHARGE OR ITCHING   Items with * indicate a potential emergency and should be followed up as soon as possible or go to the Emergency Department if any problems should occur.  Please show the CHEMOTHERAPY ALERT CARD or IMMUNOTHERAPY ALERT CARD at  check-in to the Emergency Department and triage nurse.  Should you have questions after your visit or need to cancel or reschedule your appointment, please contact [facility]  Dept: [phone]  and follow the prompts.  Office hours are 8:00 a.m. to 4:30 p.m. Monday - Friday. Please note that voicemails left after 4:00 p.m. may not be returned until the following business day.  We are closed weekends and major holidays. You have access to a nurse at all times for urgent questions. Please call the main number to the clinic Dept: [phone] and follow the prompts.   For any non-urgent questions, you may also contact your provider using MyChart. We now offer e-Visits for anyone 40 and older to request care online for non-urgent symptoms. For details visit [email or web address].   Also download the MyChart app! Go to the app store, search "MyChart", open the app, select [facility], and log in with your MyChart username and password.  Due to Covid, a mask is required upon entering the hospital/clinic. If you do not have a mask, one will be given to you upon arrival. For doctor visits, patients may have 1 support person aged 55 or older with them. For treatment visits, patients cannot have anyone with them due to current Covid guidelines and our immunocompromised population.

## 2021-04-07 LAB — GLUCOSE, CAPILLARY: Glucose-Capillary: 220 mg/dL — ABNORMAL HIGH (ref 70–99)

## 2021-04-07 LAB — TSH: TSH: 1.561 u[IU]/mL (ref 0.320–4.118)

## 2021-04-11 ENCOUNTER — Other Ambulatory Visit (HOSPITAL_COMMUNITY): Payer: Self-pay

## 2021-04-18 ENCOUNTER — Other Ambulatory Visit (HOSPITAL_COMMUNITY): Payer: Self-pay

## 2021-05-16 ENCOUNTER — Other Ambulatory Visit (HOSPITAL_COMMUNITY): Payer: Self-pay

## 2021-05-18 ENCOUNTER — Inpatient Hospital Stay: Payer: 59 | Attending: Physician Assistant

## 2021-05-18 ENCOUNTER — Other Ambulatory Visit: Payer: Self-pay | Admitting: Hematology and Oncology

## 2021-05-18 ENCOUNTER — Inpatient Hospital Stay: Payer: 59

## 2021-05-18 ENCOUNTER — Other Ambulatory Visit: Payer: Self-pay

## 2021-05-18 ENCOUNTER — Inpatient Hospital Stay: Payer: 59 | Admitting: Oncology

## 2021-05-18 ENCOUNTER — Encounter: Payer: Self-pay | Admitting: Oncology

## 2021-05-18 VITALS — BP 135/82 | HR 70 | Temp 96.7°F | Resp 18 | Wt 196.1 lb

## 2021-05-18 DIAGNOSIS — C641 Malignant neoplasm of right kidney, except renal pelvis: Secondary | ICD-10-CM

## 2021-05-18 DIAGNOSIS — Z5112 Encounter for antineoplastic immunotherapy: Secondary | ICD-10-CM | POA: Insufficient documentation

## 2021-05-18 DIAGNOSIS — Z79899 Other long term (current) drug therapy: Secondary | ICD-10-CM | POA: Diagnosis not present

## 2021-05-18 LAB — CBC WITH DIFFERENTIAL (CANCER CENTER ONLY)
Abs Immature Granulocytes: 0.02 10*3/uL (ref 0.00–0.07)
Basophils Absolute: 0 10*3/uL (ref 0.0–0.1)
Basophils Relative: 1 %
Eosinophils Absolute: 0.2 10*3/uL (ref 0.0–0.5)
Eosinophils Relative: 4 %
HCT: 41.3 % (ref 39.0–52.0)
Hemoglobin: 13.4 g/dL (ref 13.0–17.0)
Immature Granulocytes: 0 %
Lymphocytes Relative: 16 %
Lymphs Abs: 0.9 10*3/uL (ref 0.7–4.0)
MCH: 28.3 pg (ref 26.0–34.0)
MCHC: 32.4 g/dL (ref 30.0–36.0)
MCV: 87.3 fL (ref 80.0–100.0)
Monocytes Absolute: 0.5 10*3/uL (ref 0.1–1.0)
Monocytes Relative: 10 %
Neutro Abs: 3.7 10*3/uL (ref 1.7–7.7)
Neutrophils Relative %: 69 %
Platelet Count: 196 10*3/uL (ref 150–400)
RBC: 4.73 MIL/uL (ref 4.22–5.81)
RDW: 12.4 % (ref 11.5–15.5)
WBC Count: 5.3 10*3/uL (ref 4.0–10.5)
nRBC: 0 % (ref 0.0–0.2)

## 2021-05-18 LAB — CMP (CANCER CENTER ONLY)
ALT: 12 U/L (ref 0–44)
AST: 12 U/L — ABNORMAL LOW (ref 15–41)
Albumin: 4.1 g/dL (ref 3.5–5.0)
Alkaline Phosphatase: 85 U/L (ref 38–126)
Anion gap: 10 (ref 5–15)
BUN: 35 mg/dL — ABNORMAL HIGH (ref 8–23)
CO2: 23 mmol/L (ref 22–32)
Calcium: 9.5 mg/dL (ref 8.9–10.3)
Chloride: 105 mmol/L (ref 98–111)
Creatinine: 2.48 mg/dL — ABNORMAL HIGH (ref 0.61–1.24)
GFR, Estimated: 28 mL/min — ABNORMAL LOW (ref >60)
Glucose, Bld: 224 mg/dL — ABNORMAL HIGH (ref 70–99)
Potassium: 4.6 mmol/L (ref 3.5–5.1)
Sodium: 138 mmol/L (ref 135–145)
Total Bilirubin: 0.8 mg/dL (ref 0.3–1.2)
Total Protein: 7.2 g/dL (ref 6.5–8.1)

## 2021-05-18 LAB — TSH: TSH: 2.876 u[IU]/mL (ref 0.320–4.118)

## 2021-05-18 MED ORDER — SODIUM CHLORIDE 0.9 % IV SOLN
400.0000 mg | Freq: Once | INTRAVENOUS | Status: AC
  Administered 2021-05-18: 400 mg via INTRAVENOUS
  Filled 2021-05-18: qty 16

## 2021-05-18 MED ORDER — SODIUM CHLORIDE 0.9 % IV SOLN: Freq: Once | INTRAVENOUS | Status: AC

## 2021-05-18 NOTE — Patient Instructions (Signed)
[facility] ONCOLOGY  Discharge Instructions: Thank you for choosing [facility] to provide your oncology and hematology care.   If you have a lab appointment with the [facility], please go directly to the [facility] and check in at the registration area.   Wear comfortable clothing and clothing appropriate for easy access to any Portacath or PICC line.   We strive to give you quality time with your provider. You may need to reschedule your appointment if you arrive late (15 or more minutes).  Arriving late affects you and other patients whose appointments are after yours.  Also, if you miss three or more appointments without notifying the office, you may be dismissed from the clinic at the provider's discretion.      For prescription refill requests, have your pharmacy contact our office and allow 72 hours for refills to be completed.    Today you received the following chemotherapy and/or immunotherapy agents Beryle Flock      To help prevent nausea and vomiting after your treatment, we encourage you to take your nausea medication as directed.  BELOW ARE SYMPTOMS THAT SHOULD BE REPORTED IMMEDIATELY: *FEVER GREATER THAN 100.4 F (38 C) OR HIGHER *CHILLS OR SWEATING *NAUSEA AND VOMITING THAT IS NOT CONTROLLED WITH YOUR NAUSEA MEDICATION *UNUSUAL SHORTNESS OF BREATH *UNUSUAL BRUISING OR BLEEDING *URINARY PROBLEMS (pain or burning when urinating, or frequent urination) *BOWEL PROBLEMS (unusual diarrhea, constipation, pain near the anus) TENDERNESS IN MOUTH AND THROAT WITH OR WITHOUT PRESENCE OF ULCERS (sore throat, sores in mouth, or a toothache) UNUSUAL RASH, SWELLING OR PAIN  UNUSUAL VAGINAL DISCHARGE OR ITCHING   Items with * indicate a potential emergency and should be followed up as soon as possible or go to the Emergency Department if any problems should occur.  Please show the CHEMOTHERAPY ALERT CARD or IMMUNOTHERAPY ALERT CARD at check-in to  the Emergency Department and triage nurse.  Should you have questions after your visit or need to cancel or reschedule your appointment, please contact [facility]  Dept: [phone]  and follow the prompts.  Office hours are 8:00 a.m. to 4:30 p.m. Monday - Friday. Please note that voicemails left after 4:00 p.m. may not be returned until the following business day.  We are closed weekends and major holidays. You have access to a nurse at all times for urgent questions. Please call the main number to the clinic Dept: [phone] and follow the prompts.   For any non-urgent questions, you may also contact your provider using MyChart. We now offer e-Visits for anyone 56 and older to request care online for non-urgent symptoms. For details visit [email or web address].   Also download the MyChart app! Go to the app store, search "MyChart", open the app, select [facility], and log in with your MyChart username and password.  Due to Covid, a mask is required upon entering the hospital/clinic. If you do not have a mask, one will be given to you upon arrival. For doctor visits, patients may have 1 support person aged 22 or older with them. For treatment visits, patients cannot have anyone with them due to current Covid guidelines and our immunocompromised population.

## 2021-05-18 NOTE — Progress Notes (Signed)
Hematology and Oncology Follow Up Visit  [name] [id number] Nov 18, 1955 65 y.o. 05/18/2021 8:52 AM [name], MDRoss, [name], MD   Principle Diagnosis: 65 year old man with kidney cancer diagnosed in 2021.  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   Prior Therapy:  He is status post laparoscopic radical nephrectomy and retroperitoneal lymph node dissection done on August 05, 2020.  He was found to have T3a N0  Current therapy: Pembrolizumab 200 mg every 3 weeks for adjuvant purposes started on October 12, 2020.  He is currently on 400 mg every 6 weeks to complete 1 year of therapy.  He presents for the subsequent cycle of treatment.  Interim History: [name] is here for a follow-up visit.  Since the last visit, he reports no major changes in his health.  He continues to tolerate Pembrolizumab without any complaints.  He does report some mild fatigue and tiredness but manageable at this time.  His performance status and quality of life remains clinically unchanged.     Medications: Updated on review. Current Outpatient Medications  Medication Sig Dispense Refill   COVID-19 mRNA vaccine, Moderna, 100 MCG/0.5ML injection Inject into the muscle. 0.25 mL 0   COVID-19 mRNA vaccine, Pfizer, 30 MCG/0.3ML injection INJECT AS DIRECTED .3 mL 0   Dulaglutide (TRULICITY) 1.5 QP/5.9FM SOPN inject 1 pen under the skin once a week 6 mL 4   empagliflozin (JARDIANCE) 25 MG TABS tablet Take 1 tablet by mouth Once a day 90 tablet 4   ezetimibe-simvastatin (VYTORIN) 10-20 MG tablet Take 1 tablet by mouth once daily at night. 90 tablet 1   insulin detemir (LEVEMIR FLEXTOUCH) 100 UNIT/ML FlexPen Inject 18 units Subcutaneous once a day 9 mL 4   Insulin Pen Needle (COMFORT EZ PEN NEEDLES) 32G X 5 MM MISC Inject as directed once daily 100 each 1   levothyroxine (SYNTHROID) 75 MCG tablet Take 1 tablet by mouth  every morning on an empty stomach 90 tablet 4   metFORMIN (GLUCOPHAGE-XR) 500 MG 24 hr tablet Take 2 tablets by mouth with food Twice a day 360 tablet 4   NOVOTWIST PEN NEEDLE 32G X 5 MM MISC INJECT AS DIRECTED ONCE DAILY 100 each 0   rivaroxaban (XARELTO) 10 MG TABS tablet Take 1 tablet (10 mg total) by mouth daily with food 90 tablet 4   No current facility-administered medications for this visit.   Facility-Administered Medications Ordered in Other Visits  Medication Dose Route Frequency Provider Last Rate Last Admin   magnesium citrate solution 0.5 Bottle  0.5 Bottle Oral Once Raynelle Bring, MD         Allergies: No Known Allergies    Physical Exam:  Blood pressure 135/82, pulse 70, temperature (!) 96.7 F (35.9 C), temperature source Tympanic, resp. rate 18, weight 196 lb 1 oz (88.9 kg), SpO2 100 %.    ECOG:  0     General appearance: Comfortable appearing without any discomfort Head: Normocephalic without any trauma Oropharynx: Mucous membranes are moist and pink without any thrush or ulcers. Eyes: Pupils are equal and round reactive to light. Lymph nodes: No cervical, supraclavicular, inguinal or axillary lymphadenopathy.   Heart:regular rate and rhythm.  S1 and S2 without leg edema. Lung: Clear without any rhonchi or wheezes.  No dullness to percussion. Abdomin: Soft, nontender, nondistended with good bowel sounds.  No hepatosplenomegaly. Musculoskeletal: No joint deformity or effusion.  Full range of  motion noted. Neurological: No deficits noted on motor, sensory and deep tendon reflex exam. Skin: No petechial rash or dryness.  Appeared moist.         Lab Results: Lab Results  Component Value Date   WBC 5.3 05/18/2021   HGB 13.4 05/18/2021   HCT 41.3 05/18/2021   MCV 87.3 05/18/2021   PLT 196 05/18/2021     Chemistry      Component Value Date/Time   NA 138 04/06/2021 1247   NA 136 11/18/2012 1331   K 5.0 04/06/2021 1247   K 4.7 11/18/2012 1331    CL 103 04/06/2021 1247   CL 98 11/18/2012 1331   CO2 23 04/06/2021 1247   CO2 27 11/18/2012 1331   BUN 28 (H) 04/06/2021 1247   BUN 24.6 11/18/2012 1331   CREATININE 2.25 (H) 04/06/2021 1247   CREATININE 1.7 (H) 11/18/2012 1331      Component Value Date/Time   CALCIUM 9.4 04/06/2021 1247   CALCIUM 9.9 11/18/2012 1331   ALKPHOS 94 04/06/2021 1247   ALKPHOS 117 11/18/2012 1331   AST 10 (L) 04/06/2021 1247   AST 15 11/18/2012 1331   ALT 14 04/06/2021 1247   ALT 23 11/18/2012 1331   BILITOT 0.6 04/06/2021 1247   BILITOT 0.59 11/18/2012 1331         Impression and Plan:   65 year old with:   1.   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   He is currently receiving adjuvant Pembrolizumab without any major complications.  Risks and benefits of continuing this treatment to complete a year were reviewed.  Complications that include immune mediated issues, GI toxicity and possible issues were reiterated.  The plan is to complete 1 year of therapy to conclude around January 2023.   2.  IV access: Peripheral veins are currently in use without any complications.   3.  Antiemetics: no nausea or vomiting reported at this time.  Compazine is available to him.   4.  Immune mediated complications: I continue to educate him about potential issues including pneumonitis, colitis and thyroid disease.   5.  Follow-up: In 6 weeks for repeat evaluation in the next cycle of therapy.   30  minutes were spent on this visit.  Time was dedicated to reviewing laboratory data, disease status update and outlining future plan of care.        Zola Button, MD 9/14/20228:52 AM

## 2021-05-21 ENCOUNTER — Other Ambulatory Visit (HOSPITAL_COMMUNITY): Payer: Self-pay

## 2021-05-30 ENCOUNTER — Other Ambulatory Visit (HOSPITAL_COMMUNITY): Payer: Self-pay

## 2021-06-09 ENCOUNTER — Ambulatory Visit: Payer: 59 | Attending: Internal Medicine

## 2021-06-09 DIAGNOSIS — Z23 Encounter for immunization: Secondary | ICD-10-CM

## 2021-06-09 NOTE — Progress Notes (Signed)
   Covid-19 Vaccination Clinic  Name:  [name]    MRN: [id number] DOB: 09-06-1955  06/09/2021  [name] was observed post Covid-19 immunization for 15 minutes without incident. He was provided with Vaccine Information Sheet and instruction to access the V-Safe system.   [name] was instructed to call 911 with any severe reactions post vaccine: Difficulty breathing  Swelling of face and throat  A fast heartbeat  A bad rash all over body  Dizziness and weakness

## 2021-06-17 ENCOUNTER — Other Ambulatory Visit (HOSPITAL_BASED_OUTPATIENT_CLINIC_OR_DEPARTMENT_OTHER): Payer: Self-pay

## 2021-06-17 ENCOUNTER — Other Ambulatory Visit (HOSPITAL_COMMUNITY): Payer: Self-pay

## 2021-06-17 MED ORDER — MODERNA COVID-19 BIVAL BOOSTER 50 MCG/0.5ML IM SUSP
INTRAMUSCULAR | 0 refills | Status: DC
  Filled 2021-06-17: qty 0.5, 1d supply, fill #0

## 2021-06-20 ENCOUNTER — Other Ambulatory Visit (HOSPITAL_COMMUNITY): Payer: Self-pay

## 2021-06-28 ENCOUNTER — Inpatient Hospital Stay: Payer: 59

## 2021-06-28 ENCOUNTER — Inpatient Hospital Stay: Payer: 59 | Admitting: Oncology

## 2021-06-28 ENCOUNTER — Inpatient Hospital Stay: Payer: 59 | Attending: Physician Assistant

## 2021-06-28 ENCOUNTER — Encounter: Payer: Self-pay | Admitting: Oncology

## 2021-06-28 ENCOUNTER — Other Ambulatory Visit: Payer: Self-pay

## 2021-06-28 VITALS — BP 106/79 | HR 88 | Temp 97.6°F | Resp 18 | Ht 75.0 in | Wt 190.2 lb

## 2021-06-28 DIAGNOSIS — Z5112 Encounter for antineoplastic immunotherapy: Secondary | ICD-10-CM | POA: Diagnosis not present

## 2021-06-28 DIAGNOSIS — Z79899 Other long term (current) drug therapy: Secondary | ICD-10-CM | POA: Diagnosis not present

## 2021-06-28 DIAGNOSIS — C641 Malignant neoplasm of right kidney, except renal pelvis: Secondary | ICD-10-CM | POA: Diagnosis not present

## 2021-06-28 LAB — CBC WITH DIFFERENTIAL (CANCER CENTER ONLY)
Abs Immature Granulocytes: 0.02 10*3/uL (ref 0.00–0.07)
Basophils Absolute: 0 10*3/uL (ref 0.0–0.1)
Basophils Relative: 0 %
Eosinophils Absolute: 0.3 10*3/uL (ref 0.0–0.5)
Eosinophils Relative: 4 %
HCT: 43.2 % (ref 39.0–52.0)
Hemoglobin: 14.1 g/dL (ref 13.0–17.0)
Immature Granulocytes: 0 %
Lymphocytes Relative: 15 %
Lymphs Abs: 0.8 10*3/uL (ref 0.7–4.0)
MCH: 28.2 pg (ref 26.0–34.0)
MCHC: 32.6 g/dL (ref 30.0–36.0)
MCV: 86.4 fL (ref 80.0–100.0)
Monocytes Absolute: 0.4 10*3/uL (ref 0.1–1.0)
Monocytes Relative: 7 %
Neutro Abs: 4.1 10*3/uL (ref 1.7–7.7)
Neutrophils Relative %: 74 %
Platelet Count: 234 10*3/uL (ref 150–400)
RBC: 5 MIL/uL (ref 4.22–5.81)
RDW: 12.4 % (ref 11.5–15.5)
WBC Count: 5.6 10*3/uL (ref 4.0–10.5)
nRBC: 0 % (ref 0.0–0.2)

## 2021-06-28 LAB — CMP (CANCER CENTER ONLY)
ALT: 13 U/L (ref 0–44)
AST: 11 U/L — ABNORMAL LOW (ref 15–41)
Albumin: 3.9 g/dL (ref 3.5–5.0)
Alkaline Phosphatase: 95 U/L (ref 38–126)
Anion gap: 12 (ref 5–15)
BUN: 29 mg/dL — ABNORMAL HIGH (ref 8–23)
CO2: 21 mmol/L — ABNORMAL LOW (ref 22–32)
Calcium: 9.5 mg/dL (ref 8.9–10.3)
Chloride: 105 mmol/L (ref 98–111)
Creatinine: 2.01 mg/dL — ABNORMAL HIGH (ref 0.61–1.24)
GFR, Estimated: 36 mL/min — ABNORMAL LOW (ref >60)
Glucose, Bld: 272 mg/dL — ABNORMAL HIGH (ref 70–99)
Potassium: 4.7 mmol/L (ref 3.5–5.1)
Sodium: 138 mmol/L (ref 135–145)
Total Bilirubin: 0.8 mg/dL (ref 0.3–1.2)
Total Protein: 7.1 g/dL (ref 6.5–8.1)

## 2021-06-28 LAB — TSH: TSH: 2.955 u[IU]/mL (ref 0.320–4.118)

## 2021-06-28 MED ORDER — SODIUM CHLORIDE 0.9 % IV SOLN: Freq: Once | INTRAVENOUS | Status: AC

## 2021-06-28 MED ORDER — SODIUM CHLORIDE 0.9 % IV SOLN
400.0000 mg | Freq: Once | INTRAVENOUS | Status: AC
  Administered 2021-06-28: 400 mg via INTRAVENOUS
  Filled 2021-06-28: qty 16

## 2021-06-28 NOTE — Patient Instructions (Signed)
[facility]  Discharge Instructions: °Thank you for choosing [facility] to provide your oncology and hematology care.  ° °If you have a lab appointment with the Cancer Center, please go directly to the Cancer Center and check in at the registration area. °  °Wear comfortable clothing and clothing appropriate for easy access to any Portacath or PICC line.  ° °We strive to give you quality time with your provider. You may need to reschedule your appointment if you arrive late (15 or more minutes).  Arriving late affects you and other patients whose appointments are after yours.  Also, if you miss three or more appointments without notifying the office, you may be dismissed from the clinic at the provider’s discretion.    °  °For prescription refill requests, have your pharmacy contact our office and allow 72 hours for refills to be completed.   ° °Today you received the following chemotherapy and/or immunotherapy agent: Pembrolizumab (Keytruda) °  °To help prevent nausea and vomiting after your treatment, we encourage you to take your nausea medication as directed. ° °BELOW ARE SYMPTOMS THAT SHOULD BE REPORTED IMMEDIATELY: °*FEVER GREATER THAN 100.4 F (38 °C) OR HIGHER °*CHILLS OR SWEATING °*NAUSEA AND VOMITING THAT IS NOT CONTROLLED WITH YOUR NAUSEA MEDICATION °*UNUSUAL SHORTNESS OF BREATH °*UNUSUAL BRUISING OR BLEEDING °*URINARY PROBLEMS (pain or burning when urinating, or frequent urination) °*BOWEL PROBLEMS (unusual diarrhea, constipation, pain near the anus) °TENDERNESS IN MOUTH AND THROAT WITH OR WITHOUT PRESENCE OF ULCERS (sore throat, sores in mouth, or a toothache) °UNUSUAL RASH, SWELLING OR PAIN  °UNUSUAL VAGINAL DISCHARGE OR ITCHING  ° °Items with * indicate a potential emergency and should be followed up as soon as possible or go to the Emergency Department if any problems should occur. ° °Please show the CHEMOTHERAPY ALERT CARD or IMMUNOTHERAPY ALERT CARD at  check-in to the Emergency Department and triage nurse. ° °Should you have questions after your visit or need to cancel or reschedule your appointment, please contact [facility]  Dept: [phone]  and follow the prompts.  Office hours are 8:00 a.m. to 4:30 p.m. Monday - Friday. Please note that voicemails left after 4:00 p.m. may not be returned until the following business day.  We are closed weekends and major holidays. You have access to a nurse at all times for urgent questions. Please call the main number to the clinic Dept: [phone] and follow the prompts. ° ° °For any non-urgent questions, you may also contact your provider using MyChart. We now offer e-Visits for anyone 18 and older to request care online for non-urgent symptoms. For details visit [email or web address]. °  °Also download the MyChart app! Go to the app store, search "MyChart", open the app, select , and log in with your MyChart username and password. ° °Due to Covid, a mask is required upon entering the hospital/clinic. If you do not have a mask, one will be given to you upon arrival. For doctor visits, patients may have 1 support person aged 18 or older with them. For treatment visits, patients cannot have anyone with them due to current Covid guidelines and our immunocompromised population.  ° °

## 2021-06-28 NOTE — Progress Notes (Signed)
Hematology and Oncology Follow Up Visit  [name] [id number] 1956/07/21 65 y.o. 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   Prior Therapy:  He is status post laparoscopic radical nephrectomy and retroperitoneal lymph node dissection done on August 05, 2020.  He was found to have T3a N0  Current therapy: Pembrolizumab 200 mg every 3 weeks for adjuvant purposes started on October 12, 2020.  He is currently on 400 mg every 6 weeks to complete 1 year of therapy.  He is here for the next cycle of therapy.  Interim History: [name] returns today for repeat evaluation.  Since the last visit, he reports no major changes in his health.  He denies any nausea, vomiting or abdominal pain or changes in his bowels.  He denies any changes in his respiratory status or performance status.  Continues to enjoy excellent quality of life.     Medications: Reviewed without changes. Current Outpatient Medications  Medication Sig Dispense Refill   COVID-19 mRNA bivalent vaccine, Moderna, (MODERNA COVID-19 BIVAL BOOSTER) 50 MCG/0.5ML injection Inject into the muscle. 0.5 mL 0   COVID-19 mRNA vaccine, Moderna, 100 MCG/0.5ML injection Inject into the muscle. 0.25 mL 0   Dulaglutide (TRULICITY) 1.5 CV/8.9FY SOPN inject 1 pen under the skin once a week 6 mL 4   empagliflozin (JARDIANCE) 25 MG TABS tablet Take 1 tablet by mouth Once a day 90 tablet 4   ezetimibe-simvastatin (VYTORIN) 10-20 MG tablet Take 1 tablet by mouth once daily at night. 90 tablet 1   insulin detemir (LEVEMIR FLEXTOUCH) 100 UNIT/ML FlexPen Inject 18 units Subcutaneous once a day 9 mL 4   Insulin Pen Needle (COMFORT EZ PEN NEEDLES) 32G X 5 MM MISC Inject as directed once daily 100 each 1   levothyroxine (SYNTHROID) 75 MCG tablet Take 1 tablet by mouth  every morning on an empty stomach 90 tablet 4   metFORMIN (GLUCOPHAGE-XR) 500 MG 24 hr tablet Take 2 tablets by mouth with food Twice a day 360 tablet 4   NOVOTWIST PEN NEEDLE 32G X 5 MM MISC INJECT AS DIRECTED ONCE DAILY 100 each 0   rivaroxaban (XARELTO) 10 MG TABS tablet Take 1 tablet (10 mg total) by mouth daily with food 90 tablet 4   No current facility-administered medications for this visit.   Facility-Administered Medications Ordered in Other Visits  Medication Dose Route Frequency Provider Last Rate Last Admin   magnesium citrate solution 0.5 Bottle  0.5 Bottle Oral Once Raynelle Bring, MD         Allergies: No Known Allergies    Physical Exam:   Blood pressure 106/79, pulse 88, temperature 97.6 F (36.4 C), temperature source Tympanic, resp. rate 18, height 6\' 3"  (1.905 m), weight 190 lb 3.2 oz (86.3 kg), SpO2 99 %.    ECOG:  0   General appearance: Alert, awake without any distress. Head: Atraumatic without abnormalities Oropharynx: Without any thrush or ulcers. Eyes: No scleral icterus. Lymph nodes: No lymphadenopathy noted in the cervical, supraclavicular, or axillary nodes Heart:regular rate and rhythm, without any murmurs or gallops.   Lung: Clear to auscultation without any rhonchi, wheezes or dullness to percussion. Abdomin: Soft, nontender without any shifting dullness or ascites. Musculoskeletal: No clubbing or cyanosis. Neurological: No motor or sensory deficits. Skin: No rashes or lesions.  Lab Results: Lab Results  Component Value Date   WBC 5.6 06/28/2021   HGB 14.1 06/28/2021   HCT 43.2 06/28/2021   MCV 86.4 06/28/2021   PLT 234 06/28/2021     Chemistry      Component Value Date/Time   NA 138 05/18/2021 0825   NA 136 11/18/2012 1331   K 4.6 05/18/2021 0825   K 4.7 11/18/2012 1331   CL 105 05/18/2021 0825   CL 98 11/18/2012 1331   CO2 23 05/18/2021 0825   CO2 27 11/18/2012 1331   BUN 35 (H) 05/18/2021 0825   BUN 24.6  11/18/2012 1331   CREATININE 2.48 (H) 05/18/2021 0825   CREATININE 1.7 (H) 11/18/2012 1331      Component Value Date/Time   CALCIUM 9.5 05/18/2021 0825   CALCIUM 9.9 11/18/2012 1331   ALKPHOS 85 05/18/2021 0825   ALKPHOS 117 11/18/2012 1331   AST 12 (L) 05/18/2021 0825   AST 15 11/18/2012 1331   ALT 12 05/18/2021 0825   ALT 23 11/18/2012 1331   BILITOT 0.8 05/18/2021 0825   BILITOT 0.59 11/18/2012 1331         Impression and Plan:   65 year old with:   1.   Kidney cancer diagnosed in 2021.  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   His disease status was updated at this time and treatment choices were reviewed.  He continues to be on Pembrolizumab without any major complications.  Risks and benefits of continuing this treatment were reviewed.  These include autoimmune issues, GI toxicity among others.  He is agreeable to continue at this time.  He is scheduled to have a repeat imaging studies in January 2023.  2.  IV access: No issues reported with peripheral veins.   3.  Antiemetics: Compazine is available to him without any nausea vomiting.   4.  Immune mediated complications: These issues including pneumonitis, colitis and thyroid disease were reiterated.   5.  Follow-up: He will return in 6 weeks for repeat follow-up.   30  minutes were dedicated to this encounter.  Time was spent on reviewing laboratory data, disease status update and outlining future plan of care.        Zola Button, MD 10/25/202210:09 AM

## 2021-06-29 ENCOUNTER — Telehealth: Payer: Self-pay | Admitting: Oncology

## 2021-06-29 NOTE — Telephone Encounter (Signed)
Scheduled per 10/25 los, pt has been called and confirmed appt

## 2021-07-13 ENCOUNTER — Other Ambulatory Visit (HOSPITAL_COMMUNITY): Payer: Self-pay

## 2021-07-18 DIAGNOSIS — R030 Elevated blood-pressure reading, without diagnosis of hypertension: Secondary | ICD-10-CM | POA: Diagnosis not present

## 2021-07-18 DIAGNOSIS — N2581 Secondary hyperparathyroidism of renal origin: Secondary | ICD-10-CM | POA: Diagnosis not present

## 2021-07-18 DIAGNOSIS — Z85528 Personal history of other malignant neoplasm of kidney: Secondary | ICD-10-CM | POA: Diagnosis not present

## 2021-07-18 DIAGNOSIS — N183 Chronic kidney disease, stage 3 unspecified: Secondary | ICD-10-CM | POA: Diagnosis not present

## 2021-07-18 DIAGNOSIS — D631 Anemia in chronic kidney disease: Secondary | ICD-10-CM | POA: Diagnosis not present

## 2021-07-18 DIAGNOSIS — N189 Chronic kidney disease, unspecified: Secondary | ICD-10-CM | POA: Diagnosis not present

## 2021-07-20 DIAGNOSIS — R030 Elevated blood-pressure reading, without diagnosis of hypertension: Secondary | ICD-10-CM | POA: Diagnosis not present

## 2021-07-20 DIAGNOSIS — Z794 Long term (current) use of insulin: Secondary | ICD-10-CM | POA: Diagnosis not present

## 2021-07-20 DIAGNOSIS — E1169 Type 2 diabetes mellitus with other specified complication: Secondary | ICD-10-CM | POA: Diagnosis not present

## 2021-07-21 ENCOUNTER — Other Ambulatory Visit (HOSPITAL_COMMUNITY): Payer: Self-pay

## 2021-07-22 ENCOUNTER — Other Ambulatory Visit (HOSPITAL_COMMUNITY): Payer: Self-pay

## 2021-07-22 MED ORDER — LOSARTAN POTASSIUM 25 MG PO TABS
ORAL_TABLET | ORAL | 3 refills | Status: DC
  Filled 2021-07-22: qty 45, 90d supply, fill #0
  Filled 2021-10-21: qty 45, 90d supply, fill #1
  Filled 2021-12-11 – 2021-12-12 (×2): qty 45, 90d supply, fill #2

## 2021-07-25 ENCOUNTER — Other Ambulatory Visit (HOSPITAL_COMMUNITY): Payer: Self-pay

## 2021-07-31 DIAGNOSIS — R519 Headache, unspecified: Secondary | ICD-10-CM | POA: Diagnosis not present

## 2021-07-31 DIAGNOSIS — J101 Influenza due to other identified influenza virus with other respiratory manifestations: Secondary | ICD-10-CM | POA: Diagnosis not present

## 2021-07-31 DIAGNOSIS — B349 Viral infection, unspecified: Secondary | ICD-10-CM | POA: Diagnosis not present

## 2021-07-31 DIAGNOSIS — Z03818 Encounter for observation for suspected exposure to other biological agents ruled out: Secondary | ICD-10-CM | POA: Diagnosis not present

## 2021-07-31 DIAGNOSIS — R509 Fever, unspecified: Secondary | ICD-10-CM | POA: Diagnosis not present

## 2021-08-03 ENCOUNTER — Inpatient Hospital Stay (HOSPITAL_BASED_OUTPATIENT_CLINIC_OR_DEPARTMENT_OTHER)
Admission: EM | Admit: 2021-08-03 | Discharge: 2021-08-05 | DRG: 638 | Disposition: A | Discharge status: Home / Self Care | Payer: 59 | Attending: Internal Medicine | Admitting: Internal Medicine

## 2021-08-03 ENCOUNTER — Encounter (HOSPITAL_BASED_OUTPATIENT_CLINIC_OR_DEPARTMENT_OTHER): Payer: Self-pay | Admitting: Emergency Medicine

## 2021-08-03 ENCOUNTER — Other Ambulatory Visit: Payer: Self-pay

## 2021-08-03 DIAGNOSIS — E039 Hypothyroidism, unspecified: Secondary | ICD-10-CM | POA: Diagnosis present

## 2021-08-03 DIAGNOSIS — D72829 Elevated white blood cell count, unspecified: Secondary | ICD-10-CM | POA: Diagnosis not present

## 2021-08-03 DIAGNOSIS — Z20822 Contact with and (suspected) exposure to covid-19: Secondary | ICD-10-CM | POA: Diagnosis not present

## 2021-08-03 DIAGNOSIS — E8721 Acute metabolic acidosis: Secondary | ICD-10-CM | POA: Diagnosis not present

## 2021-08-03 DIAGNOSIS — Z85528 Personal history of other malignant neoplasm of kidney: Secondary | ICD-10-CM

## 2021-08-03 DIAGNOSIS — R6510 Systemic inflammatory response syndrome (SIRS) of non-infectious origin without acute organ dysfunction: Secondary | ICD-10-CM | POA: Diagnosis present

## 2021-08-03 DIAGNOSIS — Z8619 Personal history of other infectious and parasitic diseases: Secondary | ICD-10-CM | POA: Diagnosis not present

## 2021-08-03 DIAGNOSIS — N1832 Chronic kidney disease, stage 3b: Secondary | ICD-10-CM | POA: Diagnosis present

## 2021-08-03 DIAGNOSIS — E860 Dehydration: Secondary | ICD-10-CM | POA: Diagnosis not present

## 2021-08-03 DIAGNOSIS — Z86718 Personal history of other venous thrombosis and embolism: Secondary | ICD-10-CM

## 2021-08-03 DIAGNOSIS — Z905 Acquired absence of kidney: Secondary | ICD-10-CM | POA: Diagnosis not present

## 2021-08-03 DIAGNOSIS — C641 Malignant neoplasm of right kidney, except renal pelvis: Secondary | ICD-10-CM | POA: Diagnosis present

## 2021-08-03 DIAGNOSIS — E785 Hyperlipidemia, unspecified: Secondary | ICD-10-CM | POA: Diagnosis present

## 2021-08-03 DIAGNOSIS — J101 Influenza due to other identified influenza virus with other respiratory manifestations: Secondary | ICD-10-CM | POA: Diagnosis present

## 2021-08-03 DIAGNOSIS — E872 Acidosis, unspecified: Secondary | ICD-10-CM

## 2021-08-03 DIAGNOSIS — E1140 Type 2 diabetes mellitus with diabetic neuropathy, unspecified: Secondary | ICD-10-CM | POA: Diagnosis present

## 2021-08-03 DIAGNOSIS — Z7984 Long term (current) use of oral hypoglycemic drugs: Secondary | ICD-10-CM

## 2021-08-03 DIAGNOSIS — Z923 Personal history of irradiation: Secondary | ICD-10-CM

## 2021-08-03 DIAGNOSIS — R1110 Vomiting, unspecified: Secondary | ICD-10-CM | POA: Diagnosis present

## 2021-08-03 DIAGNOSIS — E1110 Type 2 diabetes mellitus with ketoacidosis without coma: Secondary | ICD-10-CM | POA: Diagnosis not present

## 2021-08-03 DIAGNOSIS — Z9221 Personal history of antineoplastic chemotherapy: Secondary | ICD-10-CM

## 2021-08-03 DIAGNOSIS — Z7901 Long term (current) use of anticoagulants: Secondary | ICD-10-CM

## 2021-08-03 DIAGNOSIS — Z86711 Personal history of pulmonary embolism: Secondary | ICD-10-CM

## 2021-08-03 DIAGNOSIS — E871 Hypo-osmolality and hyponatremia: Secondary | ICD-10-CM | POA: Diagnosis present

## 2021-08-03 DIAGNOSIS — Z808 Family history of malignant neoplasm of other organs or systems: Secondary | ICD-10-CM | POA: Diagnosis not present

## 2021-08-03 DIAGNOSIS — Z794 Long term (current) use of insulin: Secondary | ICD-10-CM | POA: Diagnosis not present

## 2021-08-03 DIAGNOSIS — N179 Acute kidney failure, unspecified: Secondary | ICD-10-CM | POA: Diagnosis not present

## 2021-08-03 DIAGNOSIS — E1122 Type 2 diabetes mellitus with diabetic chronic kidney disease: Secondary | ICD-10-CM | POA: Diagnosis present

## 2021-08-03 DIAGNOSIS — Z833 Family history of diabetes mellitus: Secondary | ICD-10-CM

## 2021-08-03 DIAGNOSIS — Z79890 Hormone replacement therapy (postmenopausal): Secondary | ICD-10-CM

## 2021-08-03 DIAGNOSIS — E119 Type 2 diabetes mellitus without complications: Secondary | ICD-10-CM

## 2021-08-03 DIAGNOSIS — M1990 Unspecified osteoarthritis, unspecified site: Secondary | ICD-10-CM | POA: Diagnosis present

## 2021-08-03 DIAGNOSIS — R000 Tachycardia, unspecified: Secondary | ICD-10-CM | POA: Diagnosis present

## 2021-08-03 LAB — COMPREHENSIVE METABOLIC PANEL
ALT: 20 U/L (ref 0–44)
AST: 19 U/L (ref 15–41)
Albumin: 4.1 g/dL (ref 3.5–5.0)
Alkaline Phosphatase: 71 U/L (ref 38–126)
Anion gap: 30 — ABNORMAL HIGH (ref 5–15)
BUN: 85 mg/dL — ABNORMAL HIGH (ref 8–23)
CO2: 8 mmol/L — ABNORMAL LOW (ref 22–32)
Calcium: 9.5 mg/dL (ref 8.9–10.3)
Chloride: 89 mmol/L — ABNORMAL LOW (ref 98–111)
Creatinine, Ser: 3.2 mg/dL — ABNORMAL HIGH (ref 0.61–1.24)
GFR, Estimated: 20 mL/min — ABNORMAL LOW (ref >60)
Glucose, Bld: 457 mg/dL — ABNORMAL HIGH (ref 70–99)
Potassium: 5.6 mmol/L — ABNORMAL HIGH (ref 3.5–5.1)
Sodium: 127 mmol/L — ABNORMAL LOW (ref 135–145)
Total Bilirubin: 0.7 mg/dL (ref 0.3–1.2)
Total Protein: 7.1 g/dL (ref 6.5–8.1)

## 2021-08-03 LAB — I-STAT VENOUS BLOOD GAS, ED
Acid-base deficit: 15 mmol/L — ABNORMAL HIGH (ref 0.0–2.0)
Bicarbonate: 13.1 mmol/L — ABNORMAL LOW (ref 20.0–28.0)
Calcium, Ion: 1.13 mmol/L — ABNORMAL LOW (ref 1.15–1.40)
HCT: 45 % (ref 39.0–52.0)
Hemoglobin: 15.3 g/dL (ref 13.0–17.0)
O2 Saturation: 40 %
Patient temperature: 97.6
Potassium: 5.2 mmol/L — ABNORMAL HIGH (ref 3.5–5.1)
Sodium: 127 mmol/L — ABNORMAL LOW (ref 135–145)
TCO2: 14 mmol/L — ABNORMAL LOW (ref 22–32)
pCO2, Ven: 37.6 mmHg — ABNORMAL LOW (ref 44.0–60.0)
pH, Ven: 7.148 — CL (ref 7.250–7.430)
pO2, Ven: 28 mmHg — CL (ref 32.0–45.0)

## 2021-08-03 LAB — BLOOD GAS, VENOUS
Acid-base deficit: 9.6 mmol/L — ABNORMAL HIGH (ref 0.0–2.0)
Bicarbonate: 17.9 mmol/L — ABNORMAL LOW (ref 20.0–28.0)
O2 Saturation: 24.9 %
Patient temperature: 98.6
pCO2, Ven: 45.9 mmHg (ref 44.0–60.0)
pH, Ven: 7.216 — ABNORMAL LOW (ref 7.250–7.430)
pO2, Ven: >31 mmHg — CL (ref 32.0–45.0)

## 2021-08-03 LAB — BASIC METABOLIC PANEL
Anion gap: 20 — ABNORMAL HIGH (ref 5–15)
Anion gap: 13 (ref 5–15)
BUN: 77 mg/dL — ABNORMAL HIGH (ref 8–23)
BUN: 73 mg/dL — ABNORMAL HIGH (ref 8–23)
CO2: 14 mmol/L — ABNORMAL LOW (ref 22–32)
CO2: 15 mmol/L — ABNORMAL LOW (ref 22–32)
Calcium: 8.1 mg/dL — ABNORMAL LOW (ref 8.9–10.3)
Calcium: 8.1 mg/dL — ABNORMAL LOW (ref 8.9–10.3)
Chloride: 99 mmol/L (ref 98–111)
Chloride: 105 mmol/L (ref 98–111)
Creatinine, Ser: 2.9 mg/dL — ABNORMAL HIGH (ref 0.61–1.24)
Creatinine, Ser: 2.37 mg/dL — ABNORMAL HIGH (ref 0.61–1.24)
GFR, Estimated: 23 mL/min — ABNORMAL LOW (ref >60)
GFR, Estimated: 29 mL/min — ABNORMAL LOW (ref >60)
Glucose, Bld: 267 mg/dL — ABNORMAL HIGH (ref 70–99)
Glucose, Bld: 150 mg/dL — ABNORMAL HIGH (ref 70–99)
Potassium: 4.6 mmol/L (ref 3.5–5.1)
Potassium: 4.6 mmol/L (ref 3.5–5.1)
Sodium: 133 mmol/L — ABNORMAL LOW (ref 135–145)
Sodium: 133 mmol/L — ABNORMAL LOW (ref 135–145)

## 2021-08-03 LAB — CBC WITH DIFFERENTIAL/PLATELET
Abs Immature Granulocytes: 0.04 10*3/uL (ref 0.00–0.07)
Basophils Absolute: 0 10*3/uL (ref 0.0–0.1)
Basophils Relative: 0 %
Eosinophils Absolute: 0 10*3/uL (ref 0.0–0.5)
Eosinophils Relative: 0 %
HCT: 45.6 % (ref 39.0–52.0)
Hemoglobin: 14.7 g/dL (ref 13.0–17.0)
Immature Granulocytes: 0 %
Lymphocytes Relative: 5 %
Lymphs Abs: 0.6 10*3/uL — ABNORMAL LOW (ref 0.7–4.0)
MCH: 28.4 pg (ref 26.0–34.0)
MCHC: 32.2 g/dL (ref 30.0–36.0)
MCV: 88 fL (ref 80.0–100.0)
Monocytes Absolute: 1.2 10*3/uL — ABNORMAL HIGH (ref 0.1–1.0)
Monocytes Relative: 9 %
Neutro Abs: 11.1 10*3/uL — ABNORMAL HIGH (ref 1.7–7.7)
Neutrophils Relative %: 86 %
Platelets: 226 10*3/uL (ref 150–400)
RBC: 5.18 MIL/uL (ref 4.22–5.81)
RDW: 13.5 % (ref 11.5–15.5)
WBC: 13 10*3/uL — ABNORMAL HIGH (ref 4.0–10.5)
nRBC: 0 % (ref 0.0–0.2)

## 2021-08-03 LAB — GLUCOSE, CAPILLARY
Glucose-Capillary: 159 mg/dL — ABNORMAL HIGH (ref 70–99)
Glucose-Capillary: 136 mg/dL — ABNORMAL HIGH (ref 70–99)
Glucose-Capillary: 127 mg/dL — ABNORMAL HIGH (ref 70–99)
Glucose-Capillary: 129 mg/dL — ABNORMAL HIGH (ref 70–99)

## 2021-08-03 LAB — BETA-HYDROXYBUTYRIC ACID
Beta-Hydroxybutyric Acid: 7.84 mmol/L — ABNORMAL HIGH (ref 0.05–0.27)
Beta-Hydroxybutyric Acid: 4.14 mmol/L — ABNORMAL HIGH (ref 0.05–0.27)
Beta-Hydroxybutyric Acid: 3.61 mmol/L — ABNORMAL HIGH (ref 0.05–0.27)

## 2021-08-03 LAB — CBG MONITORING, ED
Glucose-Capillary: 287 mg/dL — ABNORMAL HIGH (ref 70–99)
Glucose-Capillary: 258 mg/dL — ABNORMAL HIGH (ref 70–99)
Glucose-Capillary: 272 mg/dL — ABNORMAL HIGH (ref 70–99)
Glucose-Capillary: 165 mg/dL — ABNORMAL HIGH (ref 70–99)

## 2021-08-03 LAB — MAGNESIUM: Magnesium: 2.6 mg/dL — ABNORMAL HIGH (ref 1.7–2.4)

## 2021-08-03 LAB — RESP PANEL BY RT-PCR (FLU A&B, COVID) ARPGX2
Influenza A by PCR: POSITIVE — AB
Influenza B by PCR: NEGATIVE
SARS Coronavirus 2 by RT PCR: NEGATIVE

## 2021-08-03 LAB — LACTIC ACID, PLASMA
Lactic Acid, Venous: 1.5 mmol/L (ref 0.5–1.9)
Lactic Acid, Venous: 1.2 mmol/L (ref 0.5–1.9)

## 2021-08-03 MED ORDER — POTASSIUM CHLORIDE 10 MEQ/100ML IV SOLN
10.0000 meq | INTRAVENOUS | Status: AC
  Administered 2021-08-03 (×2): 10 meq via INTRAVENOUS
  Filled 2021-08-03 (×2): qty 100

## 2021-08-03 MED ORDER — LACTATED RINGERS IV SOLN: INTRAVENOUS | Status: DC

## 2021-08-03 MED ORDER — EZETIMIBE-SIMVASTATIN 10-20 MG PO TABS
1.0000 | ORAL_TABLET | Freq: Every day | ORAL | Status: DC
  Filled 2021-08-03: qty 1

## 2021-08-03 MED ORDER — DIPHENHYDRAMINE HCL 50 MG/ML IJ SOLN
12.5000 mg | Freq: Once | INTRAMUSCULAR | Status: AC
  Administered 2021-08-03: 12.5 mg via INTRAVENOUS
  Filled 2021-08-03: qty 1

## 2021-08-03 MED ORDER — OSELTAMIVIR PHOSPHATE 30 MG PO CAPS
30.0000 mg | ORAL_CAPSULE | Freq: Two times a day (BID) | ORAL | Status: AC
  Administered 2021-08-04 – 2021-08-05 (×4): 30 mg via ORAL
  Filled 2021-08-03 (×5): qty 1

## 2021-08-03 MED ORDER — CHLORHEXIDINE GLUCONATE CLOTH 2 % EX PADS
6.0000 | MEDICATED_PAD | Freq: Every day | CUTANEOUS | Status: DC
  Administered 2021-08-03 – 2021-08-04 (×2): 6 via TOPICAL

## 2021-08-03 MED ORDER — RIVAROXABAN 10 MG PO TABS
10.0000 mg | ORAL_TABLET | Freq: Every day | ORAL | Status: DC
  Administered 2021-08-04 – 2021-08-05 (×2): 10 mg via ORAL
  Filled 2021-08-03 (×2): qty 1

## 2021-08-03 MED ORDER — ONDANSETRON HCL 4 MG/2ML IJ SOLN
4.0000 mg | Freq: Once | INTRAMUSCULAR | Status: AC
  Administered 2021-08-03: 4 mg via INTRAVENOUS
  Filled 2021-08-03: qty 2

## 2021-08-03 MED ORDER — INSULIN ASPART 100 UNIT/ML IJ SOLN
10.0000 [IU] | Freq: Once | INTRAMUSCULAR | Status: AC
  Administered 2021-08-03: 10 [IU] via SUBCUTANEOUS

## 2021-08-03 MED ORDER — LEVOTHYROXINE SODIUM 75 MCG PO TABS
75.0000 ug | ORAL_TABLET | Freq: Every day | ORAL | Status: DC
  Administered 2021-08-04 – 2021-08-05 (×2): 75 ug via ORAL
  Filled 2021-08-03 (×2): qty 1

## 2021-08-03 MED ORDER — SODIUM CHLORIDE 0.9 % IV BOLUS
1000.0000 mL | Freq: Once | INTRAVENOUS | Status: AC
  Administered 2021-08-03: 1000 mL via INTRAVENOUS

## 2021-08-03 MED ORDER — ORAL CARE MOUTH RINSE
15.0000 mL | Freq: Two times a day (BID) | OROMUCOSAL | Status: DC
  Administered 2021-08-04 – 2021-08-05 (×3): 15 mL via OROMUCOSAL

## 2021-08-03 MED ORDER — DEXTROSE 50 % IV SOLN: 0.0000 mL | INTRAVENOUS | Status: DC | PRN

## 2021-08-03 MED ORDER — ONDANSETRON HCL 4 MG/2ML IJ SOLN: 4.0000 mg | Freq: Four times a day (QID) | INTRAMUSCULAR | Status: DC | PRN

## 2021-08-03 MED ORDER — LACTATED RINGERS IV BOLUS
20.0000 mL/kg | Freq: Once | INTRAVENOUS | Status: AC
  Administered 2021-08-03: 1000 mL via INTRAVENOUS

## 2021-08-03 MED ORDER — EZETIMIBE 10 MG PO TABS
10.0000 mg | ORAL_TABLET | Freq: Every day | ORAL | Status: DC
  Administered 2021-08-04 (×2): 10 mg via ORAL
  Filled 2021-08-03 (×2): qty 1

## 2021-08-03 MED ORDER — DEXTROSE IN LACTATED RINGERS 5 % IV SOLN: INTRAVENOUS | Status: DC

## 2021-08-03 MED ORDER — ACETAMINOPHEN 500 MG PO TABS
1000.0000 mg | ORAL_TABLET | Freq: Once | ORAL | Status: AC
  Administered 2021-08-03: 1000 mg via ORAL
  Filled 2021-08-03: qty 2

## 2021-08-03 MED ORDER — INSULIN REGULAR(HUMAN) IN NACL 100-0.9 UT/100ML-% IV SOLN
INTRAVENOUS | Status: DC
  Administered 2021-08-03: 10 [IU]/h via INTRAVENOUS
  Filled 2021-08-03: qty 100

## 2021-08-03 MED ORDER — SIMVASTATIN 10 MG PO TABS
20.0000 mg | ORAL_TABLET | Freq: Every day | ORAL | Status: DC
  Administered 2021-08-04 (×2): 20 mg via ORAL
  Filled 2021-08-03 (×2): qty 2

## 2021-08-03 MED ORDER — OSELTAMIVIR PHOSPHATE 30 MG PO CAPS: 30.0000 mg | ORAL_CAPSULE | Freq: Two times a day (BID) | ORAL | Status: DC

## 2021-08-03 MED ORDER — METOCLOPRAMIDE HCL 5 MG/ML IJ SOLN
5.0000 mg | Freq: Once | INTRAMUSCULAR | Status: AC
  Administered 2021-08-03: 5 mg via INTRAVENOUS
  Filled 2021-08-03: qty 2

## 2021-08-03 MED ORDER — HEPARIN SODIUM (PORCINE) 5000 UNIT/ML IJ SOLN
5000.0000 [IU] | Freq: Three times a day (TID) | INTRAMUSCULAR | Status: DC
  Administered 2021-08-03: 5000 [IU] via SUBCUTANEOUS
  Filled 2021-08-03: qty 1

## 2021-08-03 NOTE — ED Notes (Signed)
Unable to obtain 2nd iv site by 3 staff members.

## 2021-08-03 NOTE — H&P (Signed)
History and Physical    [name] HEN:[id number] DOB: 27-Jan-1956 DOA: 08/03/2021  PCP: [name], MD  Patient coming from: [place]  I have personally briefly reviewed patient's old medical records in [place]  Chief Complaint: Nausea vomiting  HPI: [name] is a 65 y.o. 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  Patient was recently diagnosed with the flu about 3 days ago and was started on Tamiflu.  However he continued to have nausea, vomiting and decreased appetite.  Denies abdominal pain.  Felt weak.  He continued to take his home dose Levemir 20 units.   ED Course:  Temp of 97.24F, BP of 87/59, HR of 101 pH of 7.148, CO2 of 8, BG of 457, anion gap of 30.  Na of 127, K of 5.5, creatinine of 3.2/BUN of 85 from 2.2 WBC of 13K, lact of 1.5 Influenza A positive. COVID negative.   Review of Systems: Pertinent positives and negatives as above.  Unable to obtain for ROS as patient was fatigued and provided limited  Past Medical History:  Diagnosis Date   Alopecia    s/p chemotherapy   Arthritis    Bursitis    pt. 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,stg IV,HPV positive   Diabetes mellitus without complication ([employer])    takes Amaryl,Januvia,and Metformin,daily   DVT, lower extremity ([place]) 2009   side effect from chemo and has blood clot left leg-wears compression stockings;was on Coumadin for 31months and has been off 5 yrs   History of chemotherapy    Cisplatin/Taxotere,5-FU   History of radiation therapy 01/28/08-03/11/08   left base tongue   History of shingles    Hyperlipidemia    takes Vytorin daily   Hypothyroidism    takes Synthroid daily   Joint pain    Joint swelling    Neuropathy    left foot   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 denies   Right kidney mass    Seborrheic keratosis    multiple on back    Past Surgical History:  Procedure Laterality Date   CARDIAC CATHETERIZATION     feeding tube placed  2009 and then removed   [place] Right 08/05/2020   Procedure: LYMPH NODE DISSECTION;  Surgeon: Raynelle Bring, MD;  Location: [facility];  Service: Urology;  Laterality: Right;   MOUTH SURGERY     PILONIDAL CYST / SINUS EXCISION  1974   port a cath placement     PORT-A-CATH REMOVAL  2010   right wrist fracture Right 2004   ROBOT ASSISTED LAPAROSCOPIC NEPHRECTOMY Right 08/05/2020   Procedure: XI ROBOTIC ASSISTED LAPAROSCOPIC RADICAL NEPHRECTOMY/PARTIAL ADRENALECTOMY;  Surgeon: Raynelle Bring, MD;  Location: [facility];  Service: Urology;  Laterality: Right;   SHOULDER ARTHROSCOPY WITH DISTAL CLAVICLE RESECTION Right 10/26/2016   Procedure: SHOULDER ARTHROSCOPY WITH DISTAL CLAVICLE RESECTION;  Surgeon: Justice Britain, MD;  Location: [facility];  Service: Orthopedics;  Laterality: Right;   SHOULDER ARTHROSCOPY WITH ROTATOR CUFF REPAIR AND SUBACROMIAL DECOMPRESSION Left 07/16/2014   Procedure: LEFT SHOULDER ARTHROSCOPY WITH ROTATOR CUFF REPAIR AND SUBACROMIAL DECOMPRESSION/DISTAL CLAVICLE RESECTION;  Surgeon: Marin Shutter, MD;  Location: [facility];  Service: Orthopedics;  Laterality: Left;   SHOULDER ARTHROSCOPY WITH ROTATOR CUFF REPAIR AND SUBACROMIAL DECOMPRESSION Right 10/26/2016   Procedure: RIGHT SHOULDER ARTHROSCOPY WITH ROTATOR CUFF REPAIR AND SUBACROMIAL DECOMPRESSION;  Surgeon: Justice Britain, MD;  Location: [place];  Service: Orthopedics;  Laterality: Right;  requests 2hrs   teeth extraction #[id number]       reports that he has never smoked. He has never used smokeless tobacco. He reports current alcohol use. He reports that he does not use drugs. Social History  No Known Allergies  Family History  Problem Relation Age of  Onset   Cancer Mother        uterine   Diabetes Brother      Prior to Admission medications   Medication Sig Start Date End Date Taking? Authorizing Provider  Dulaglutide (TRULICITY) 1.5 JX/9.1YN SOPN inject 1 pen under the skin once a week 01/25/21  Yes   empagliflozin (JARDIANCE) 25 MG TABS tablet Take 1 tablet by mouth Once a day 01/25/21  Yes   ezetimibe-simvastatin (VYTORIN) 10-20 MG tablet Take 1 tablet by mouth once daily at night. 02/14/21  Yes   insulin detemir (LEVEMIR FLEXTOUCH) 100 UNIT/ML FlexPen Inject 18 units Subcutaneous once a day 01/25/21  Yes   levothyroxine (SYNTHROID) 75 MCG tablet Take 1 tablet by mouth every morning on an empty stomach 01/25/21  Yes   losartan (COZAAR) 25 MG tablet Take 1/2 tablet by mouth at bedtime. 07/22/21  Yes Gean Quint, MD  metFORMIN (GLUCOPHAGE-XR) 500 MG 24 hr tablet Take 2 tablets by mouth with food Twice a day 01/25/21  Yes   oseltamivir (TAMIFLU) 75 MG capsule Take 75 mg by mouth 2 (two) times daily. 07/31/21  Yes [provider]  rivaroxaban (XARELTO) 10 MG TABS tablet Take 1 tablet (10 mg total) by mouth daily with food 02/28/21  Yes   Insulin Pen Needle (COMFORT EZ PEN NEEDLES) 32G X 5 MM MISC Inject as directed once daily 03/08/21     NOVOTWIST PEN NEEDLE 32G X 5 MM MISC INJECT AS DIRECTED ONCE DAILY 11/23/20 11/23/21  [name], MD  glimepiride (AMARYL) 4 MG tablet Take 4 mg by mouth 2 (two) times daily.   06/05/20  [provider]    Physical Exam: Vitals:   08/03/21 1900 08/03/21 1947 08/03/21 2000 08/03/21 2100  BP: 140/65  113/61 133/70  Pulse: 88 88 87 90  Resp: 13 18 18 14   Temp:   97.8 F (36.6 C)   TempSrc:   Oral   SpO2: 94% 96% 95% 96%  Weight:      Height:        Constitutional: NAD, ill fatigue appearing elderly male Vitals:   08/03/21 1900 08/03/21 1947 08/03/21 2000 08/03/21 2100  BP: 140/65  113/61 133/70  Pulse: 88 88 87 90  Resp: 13 18 18 14   Temp:   97.8 F (36.6 C)   TempSrc:    Oral   SpO2: 94% 96% 95% 96%  Weight:      Height:       Eyes: PERRL, lids and conjunctivae normal ENMT: Mucous membranes are dry. Neck: normal, supple Respiratory: clear to auscultation bilaterally, no wheezing, no crackles. Normal respiratory effort. No accessory muscle use.  Cardiovascular: Regular rate and rhythm, no murmurs / rubs / gallops. No extremity edema.  Abdomen: Voluntary guarding of the umbilical region bowel sounds positive.  Musculoskeletal: no clubbing / cyanosis. No joint deformity upper and lower extremities. Good ROM, no contractures. Normal muscle tone.  Skin:  no rashes, lesions, ulcers.  Neurologic: CN 2-12 grossly intact. Strength 5/5 in all 4.  Psychiatric: Normal judgment and insight. Alert and oriented x 3. Normal mood.    Labs on Admission: I have personally reviewed following labs and imaging studies  CBC: Recent Labs  Lab 08/03/21 1112 08/03/21 1400  WBC 13.0*  --   NEUTROABS 11.1*  --   HGB 14.7 15.3  HCT 45.6 45.0  MCV 88.0  --   PLT 226  --    Basic Metabolic Panel: Recent Labs  Lab 08/03/21 1112 08/03/21 1400 08/03/21 1522 08/03/21 2103  NA 127* 127* 133* 133*  K 5.6* 5.2* 4.6 4.6  CL 89*  --  99 105  CO2 8*  --  14* 15*  GLUCOSE 457*  --  267* 150*  BUN 85*  --  77* 73*  CREATININE 3.20*  --  2.90* 2.37*  CALCIUM 9.5  --  8.1* 8.1*  MG 2.6*  --   --   --    GFR: Estimated Creatinine Clearance: 36 mL/min (A) (by C-G formula based on SCr of 2.37 mg/dL (H)). Liver Function Tests: Recent Labs  Lab 08/03/21 1112  AST 19  ALT 20  ALKPHOS 71  BILITOT 0.7  PROT 7.1  ALBUMIN 4.1   No results for input(s): LIPASE, AMYLASE in the last 168 hours. No results for input(s): AMMONIA in the last 168 hours. Coagulation Profile: No results for input(s): INR, PROTIME in the last 168 hours. Cardiac Enzymes: No results for input(s): CKTOTAL, CKMB, CKMBINDEX, TROPONINI in the last 168 hours. BNP (last 3 results) No results for input(s):  PROBNP in the last 8760 hours. HbA1C: No results for input(s): HGBA1C in the last 72 hours. CBG: Recent Labs  Lab 08/03/21 1630 08/03/21 1734 08/03/21 1849 08/03/21 1944 08/03/21 2103  GLUCAP 272* 165* 159* 136* 127*   Lipid Profile: No results for input(s): CHOL, HDL, LDLCALC, TRIG, CHOLHDL, LDLDIRECT in the last 72 hours. Thyroid Function Tests: No results for input(s): TSH, T4TOTAL, FREET4, T3FREE, THYROIDAB in the last 72 hours. Anemia Panel: No results for input(s): VITAMINB12, FOLATE, FERRITIN, TIBC, IRON, RETICCTPCT in the last 72 hours. Urine analysis:    Component Value Date/Time   COLORURINE RED (A) 06/06/2020 1108   APPEARANCEUR CLEAR 06/06/2020 1108   LABSPEC 1.000 (L) 06/06/2020 1108   PHURINE 6.0 06/06/2020 1108   GLUCOSEU >=500 (A) 06/06/2020 1108   HGBUR MODERATE (A) 06/06/2020 1108   BILIRUBINUR NEGATIVE 06/06/2020 1108   KETONESUR 5 (A) 06/06/2020 1108   PROTEINUR 100 (A) 06/06/2020 1108   NITRITE NEGATIVE 06/06/2020 1108   LEUKOCYTESUR NEGATIVE 06/06/2020 1108    Radiological Exams on Admission: No results found.    Assessment/Plan  DKA with hx of insulin-dependent type 2 diabetes - replete potassium PRN - continue insulin gtt with goal of 140-180 and AG <12 - IV NS until BG <250, then switch to D5 1/2 NS  - BMP q4hr  - keep NPO  AKI -Creatinine of 3.2 from prior of 2.01 -Secondary to dehydration.  Continue to follow with IV fluids, renally dose medication and avoid contrasted studies -Hold ACE  Sepsis secondary to influenza A -Patient presented with tachycardia and leukocytosis -Renally dose Tamiflu to 30mg  BID  History of PE/DVT - Continue Xarelto  History of right renal cell carcinoma s/p radical nephrectomy On Pembrolizumab q6 wks - next week for next treatment Followed by Dr. Alen [facility]  DVT prophylaxis:.Xarelto Code Status: Full Family Communication: Plan discussed with patient  at bedside  disposition Plan: Home with at  least 2 midnight stays  Consults called:  Admission status: inpatient  Level of care: Stepdown  Status is: Inpatient  Remains inpatient appropriate because: DKA requiring IV insulin infusion         Birdella Sippel T Beckhem Isadore DO [facility]   If 7PM-7AM, please contact night-coverage [email or web address]   08/03/2021, 9:47 PM

## 2021-08-03 NOTE — ED Notes (Signed)
Report called to care link 

## 2021-08-03 NOTE — Plan of Care (Signed)

## 2021-08-03 NOTE — ED Notes (Signed)
Report called to [facility] RN, [place] icu.

## 2021-08-03 NOTE — ED Provider Notes (Signed)
[facility] EMERGENCY DEPT Provider Note   CSN: [id number] Arrival date & time: 08/03/21  8101     History No chief complaint on file.   [name] is a 65 y.o. male.  65 yo M with a chief complaint of not able to eat and drink.  Has been sick for about 3 days now.  Was seen by his family doctor and diagnosed with the flu and started on Tamiflu.  Since then has had persistent nausea and vomiting.  Felt increasingly weak and came into the ED for evaluation.  Denies any obvious abdominal discomfort but does state that he feels a bit nauseated.  Has had some coughing but no difficulty breathing no chest discomfort.  The history is provided by the patient.  Illness Severity:  Moderate Onset quality:  Gradual Duration:  3 days Timing:  Constant Progression:  Worsening Chronicity:  New Associated symptoms: congestion, cough, fever, myalgias, nausea and vomiting   Associated symptoms: no abdominal pain, no chest pain, no diarrhea, no headaches, no rash and no shortness of breath       Past Medical History:  Diagnosis Date   Alopecia    s/p chemotherapy   Arthritis    Bursitis    pt. 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,stg IV,HPV positive   Diabetes mellitus without complication ([place])    takes Amaryl,Januvia,and Metformin,daily   DVT, lower extremity ([place]) 2009   side effect from chemo and has blood clot left leg-wears compression stockings;was on Coumadin for 3months and has been off 5 yrs   History of chemotherapy    Cisplatin/Taxotere,5-FU   History of radiation therapy 01/28/08-03/11/08   left base tongue   History of shingles    Hyperlipidemia    takes Vytorin daily   Hypothyroidism    takes Synthroid daily   Joint pain    Joint swelling    Neuropathy    left foot   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   Pulmonary embolism ([place]) 07/20/2011    Past Surgical History:  Procedure Laterality Date   CARDIAC CATHETERIZATION     feeding tube placed  2009 and then removed   JOINT REPLACEMENT     KNEE SURGERY Left [place] Right 08/05/2020   Procedure: LYMPH NODE DISSECTION;  Surgeon: Raynelle Bring, MD;  Location: [facility];  Service: Urology;  Laterality: Right;   MOUTH SURGERY     PILONIDAL CYST / SINUS EXCISION  1974   port a cath placement     PORT-A-CATH REMOVAL  2010   right wrist fracture Right 2004   ROBOT ASSISTED LAPAROSCOPIC NEPHRECTOMY Right 08/05/2020  Procedure: XI ROBOTIC ASSISTED LAPAROSCOPIC RADICAL NEPHRECTOMY/PARTIAL ADRENALECTOMY;  Surgeon: Raynelle Bring, MD;  Location: [facility];  Service: Urology;  Laterality: Right;   SHOULDER ARTHROSCOPY WITH DISTAL CLAVICLE RESECTION Right 10/26/2016   Procedure: SHOULDER ARTHROSCOPY WITH DISTAL CLAVICLE RESECTION;  Surgeon: Justice Britain, MD;  Location: [facility];  Service: Orthopedics;  Laterality: Right;   SHOULDER ARTHROSCOPY WITH ROTATOR CUFF REPAIR AND SUBACROMIAL DECOMPRESSION Left 07/16/2014   Procedure: LEFT SHOULDER ARTHROSCOPY WITH ROTATOR CUFF REPAIR AND SUBACROMIAL DECOMPRESSION/DISTAL CLAVICLE RESECTION;  Surgeon: Marin Shutter, MD;  Location: [facility];  Service: Orthopedics;  Laterality: Left;   SHOULDER ARTHROSCOPY WITH ROTATOR  CUFF REPAIR AND SUBACROMIAL DECOMPRESSION Right 10/26/2016   Procedure: RIGHT SHOULDER ARTHROSCOPY WITH ROTATOR CUFF REPAIR AND SUBACROMIAL DECOMPRESSION;  Surgeon: Justice Britain, MD;  Location: [facility];  Service: Orthopedics;  Laterality: Right;  requests 2hrs   teeth extraction #9         Family History  Problem Relation Age of Onset   Cancer Mother        uterine   Diabetes Brother     Social History   Tobacco Use   Smoking status: Never   Smokeless tobacco: Never  Vaping Use   Vaping Use: Never used  Substance Use Topics   Alcohol use: Yes    Comment: occasional drink    Drug use: No    Home Medications Prior to Admission medications   Medication Sig Start Date End Date Taking? Authorizing Provider  Dulaglutide (TRULICITY) 1.5 HE/5.2DP SOPN inject 1 pen under the skin once a week 01/25/21  Yes   empagliflozin (JARDIANCE) 25 MG TABS tablet Take 1 tablet by mouth Once a day 01/25/21  Yes   ezetimibe-simvastatin (VYTORIN) 10-20 MG tablet Take 1 tablet by mouth once daily at night. 02/14/21  Yes   insulin detemir (LEVEMIR FLEXTOUCH) 100 UNIT/ML FlexPen Inject 18 units Subcutaneous once a day 01/25/21  Yes   levothyroxine (SYNTHROID) 75 MCG tablet Take 1 tablet by mouth every morning on an empty stomach 01/25/21  Yes   losartan (COZAAR) 25 MG tablet Take 1/2 tablet by mouth at bedtime. 07/22/21  Yes Gean Quint, MD  metFORMIN (GLUCOPHAGE-XR) 500 MG 24 hr tablet Take 2 tablets by mouth with food Twice a day 01/25/21  Yes   rivaroxaban (XARELTO) 10 MG TABS tablet Take 1 tablet (10 mg total) by mouth daily with food 02/28/21  Yes   COVID-19 mRNA bivalent vaccine, Moderna, (MODERNA COVID-19 BIVAL BOOSTER) 50 MCG/0.5ML injection Inject into the muscle. 06/09/21   Carlyle Basques, MD  COVID-19 mRNA vaccine, Moderna, 100 MCG/0.5ML injection Inject into the muscle. 12/30/20   Carlyle Basques, MD  Insulin Pen Needle (COMFORT EZ PEN NEEDLES) 32G X 5 MM MISC Inject as directed once daily 03/08/21      NOVOTWIST PEN NEEDLE 32G X 5 MM MISC INJECT AS DIRECTED ONCE DAILY 11/23/20 11/23/21  [name], MD  glimepiride (AMARYL) 4 MG tablet Take 4 mg by mouth 2 (two) times daily.   06/05/20  [provider]    Allergies    Patient has no known allergies.  Review of Systems   Review of Systems  Constitutional:  Positive for chills and fever.  HENT:  Positive for congestion. Negative for facial swelling.   Eyes:  Negative for discharge and visual disturbance.  Respiratory:  Positive for cough. Negative for shortness of breath.   Cardiovascular:  Negative for chest pain and palpitations.  Gastrointestinal:  Positive for nausea and vomiting. Negative for  abdominal pain and diarrhea.  Musculoskeletal:  Positive for myalgias. Negative for arthralgias.  Skin:  Negative for color change and rash.  Neurological:  Negative for tremors, syncope and headaches.  Psychiatric/Behavioral:  Negative for confusion and dysphoric mood.    Physical Exam Updated Vital Signs BP (!) 156/67 (BP Location: Right Arm)   Pulse 88   Temp 97.6 F (36.4 C) (Oral)   Resp 16   Ht 6\' 3"  (1.905 m)   Wt 86.2 kg   SpO2 91%   BMI 23.75 kg/m   Physical Exam Vitals and nursing note reviewed.  Constitutional:      Appearance: He is well-developed.  HENT:     Head: Normocephalic and atraumatic.  Eyes:     Pupils: Pupils are equal, round, and reactive to light.  Neck:     Vascular: No JVD.  Cardiovascular:     Rate and Rhythm: Normal rate and regular rhythm.     Heart sounds: No murmur heard.   No friction rub. No gallop.  Pulmonary:     Effort: No respiratory distress.     Breath sounds: No wheezing.  Abdominal:     General: There is no distension.     Tenderness: There is no abdominal tenderness. There is no guarding or rebound.  Musculoskeletal:        General: Normal range of motion.     Cervical back: Normal range of motion and neck supple.  Skin:    Coloration: Skin is not pale.      Findings: No rash.  Neurological:     Mental Status: He is alert and oriented to person, place, and time.  Psychiatric:        Behavior: Behavior normal.    ED Results / Procedures / Treatments   Labs (all labs ordered are listed, but only abnormal results are displayed) Labs Reviewed  CBC WITH DIFFERENTIAL/PLATELET - Abnormal; Notable for the following components:      Result Value   WBC 13.0 (*)    Neutro Abs 11.1 (*)    Lymphs Abs 0.6 (*)    Monocytes Absolute 1.2 (*)    All other components within normal limits  COMPREHENSIVE METABOLIC PANEL - Abnormal; Notable for the following components:   Sodium 127 (*)    Potassium 5.6 (*)    Chloride 89 (*)    CO2 8 (*)    Glucose, Bld 457 (*)    BUN 85 (*)    Creatinine, Ser 3.20 (*)    GFR, Estimated 20 (*)    Anion gap 30 (*)    All other components within normal limits  MAGNESIUM - Abnormal; Notable for the following components:   Magnesium 2.6 (*)    All other components within normal limits  CBG MONITORING, ED - Abnormal; Notable for the following components:   Glucose-Capillary 287 (*)    All other components within normal limits  I-STAT VENOUS BLOOD GAS, ED - Abnormal; Notable for the following components:   pH, Ven 7.148 (*)    pCO2, Ven 37.6 (*)    pO2, Ven 28.0 (*)    Bicarbonate 13.1 (*)    TCO2 14 (*)    Acid-base deficit 15.0 (*)    Sodium 127 (*)    Potassium 5.2 (*)    Calcium, Ion 1.13 (*)    All other components within normal limits  LACTIC ACID, PLASMA  BLOOD GAS, VENOUS  BETA-HYDROXYBUTYRIC ACID  LACTIC ACID, PLASMA  URINALYSIS, ROUTINE W REFLEX MICROSCOPIC  BASIC METABOLIC PANEL  BASIC METABOLIC PANEL  BASIC METABOLIC PANEL  BASIC METABOLIC PANEL  BETA-HYDROXYBUTYRIC ACID  BETA-HYDROXYBUTYRIC ACID  URINALYSIS, ROUTINE W REFLEX MICROSCOPIC    EKG None  Radiology No results found.  Procedures Procedures  Procedure note: Ultrasound Guided Peripheral IV Ultrasound guided peripheral 1.88  inch angiocath IV placement performed by me. Indications: Nursing unable to place IV. Details: The antecubital fossa and upper arm were evaluated with a multifrequency linear probe. Patent brachial veins were noted. 1 attempt was made to cannulate a vein under realtime US guidance with successful cannulation of the vein and catheter placement. There is return of non-pulsatile dark red blood. The patient tolerated the procedure well without complications. Images archived electronically.  CPT codes: [phone] and [phone]  Medications Ordered in ED Medications  lactated ringers bolus 1,724 mL (has no administration in time range)  insulin regular, human (MYXREDLIN) 100 units/ 100 mL infusion (10 Units/hr Intravenous New Bag/Given 08/03/21 1425)  lactated ringers infusion (has no administration in time range)  dextrose 5 % in lactated ringers infusion (has no administration in time range)  dextrose 50 % solution 0-50 mL (has no administration in time range)  sodium chloride 0.9 % bolus 1,000 mL (0 mLs Intravenous Stopped 08/03/21 1425)  ondansetron (ZOFRAN) injection 4 mg (4 mg Intravenous Given 08/03/21 1129)  acetaminophen (TYLENOL) tablet 1,000 mg (1,000 mg Oral Given 08/03/21 1129)  sodium chloride 0.9 % bolus 1,000 mL (0 mLs Intravenous Stopped 08/03/21 1426)  insulin aspart (novoLOG) injection 10 Units (10 Units Subcutaneous Given 08/03/21 1306)  metoCLOPramide (REGLAN) injection 5 mg (5 mg Intravenous Given 08/03/21 1307)  diphenhydrAMINE (BENADRYL) injection 12.5 mg (12.5 mg Intravenous Given 08/03/21 1307)    ED Course  I have reviewed the triage vital signs and the nursing notes.  Pertinent labs & imaging results that were available during my care of the patient were reviewed by me and considered in my medical decision making (see chart for details).    MDM Rules/Calculators/A&P                           65 yo M with a chief complaint of nausea and vomiting with a recent flu diagnosis  and being started on Tamiflu.  We will give a bolus of IV fluids antiemetics lab work reassess.  Patient is feeling better on reassessment though still feels nauseated we will give a dose of Reglan and Benadryl.  Has resulted and patient has a severe acidosis with a bicarb of 8.  He does not look clinically that unwell.  Suspect is likely all due to dehydration with his influenza diagnosis.  Other possibility would be diabetic ketoacidosis or lactic acidosis.  We will send off VBG lactate beta hydroxybutyric acid.  Will discuss with medicine.  I discussed the case with Dr. Benny Lennert, requesting beta hydroxybutyric acid results prior to putting in bed request as this may impact where they get placed in the hospital.  CRITICAL CARE Performed by: Cecilio Asper   Total critical care time: 35 minutes  Critical care time was exclusive of separately billable procedures and treating other patients.  Critical care was necessary to treat or prevent imminent or life-threatening deterioration.  Critical care was time spent personally by me on the following activities: development of treatment plan with patient and/or surrogate as well as nursing, discussions with consultants, evaluation of patient's response to treatment, examination of patient, obtaining history from patient or surrogate, ordering  and performing treatments and interventions, ordering and review of laboratory studies, ordering and review of radiographic studies, pulse oximetry and re-evaluation of patient's condition.  The patients results and plan were reviewed and discussed.   Any x-rays performed were independently reviewed by myself.   Differential diagnosis were considered with the presenting HPI.  Medications  lactated ringers bolus 1,724 mL (has no administration in time range)  insulin regular, human (MYXREDLIN) 100 units/ 100 mL infusion (10 Units/hr Intravenous New Bag/Given 08/03/21 1425)  lactated ringers infusion (has  no administration in time range)  dextrose 5 % in lactated ringers infusion (has no administration in time range)  dextrose 50 % solution 0-50 mL (has no administration in time range)  sodium chloride 0.9 % bolus 1,000 mL (0 mLs Intravenous Stopped 08/03/21 1425)  ondansetron (ZOFRAN) injection 4 mg (4 mg Intravenous Given 08/03/21 1129)  acetaminophen (TYLENOL) tablet 1,000 mg (1,000 mg Oral Given 08/03/21 1129)  sodium chloride 0.9 % bolus 1,000 mL (0 mLs Intravenous Stopped 08/03/21 1426)  insulin aspart (novoLOG) injection 10 Units (10 Units Subcutaneous Given 08/03/21 1306)  metoCLOPramide (REGLAN) injection 5 mg (5 mg Intravenous Given 08/03/21 1307)  diphenhydrAMINE (BENADRYL) injection 12.5 mg (12.5 mg Intravenous Given 08/03/21 1307)    Vitals:   08/03/21 1021 08/03/21 1100 08/03/21 1200 08/03/21 1300  BP:  (!) 146/68 136/60 (!) 156/67  Pulse:  95 86 88  Resp:  16 16 16   Temp:      TempSrc:      SpO2:  95% 96% 91%  Weight: 86.2 kg     Height: 6\' 3"  (1.905 m)       Final diagnoses:  Metabolic acidosis  Dehydration  Influenza A    Admission/ observation were discussed with the admitting physician, patient and/or family and they are comfortable with the plan.    Final Clinical Impression(s) / ED Diagnoses Final diagnoses:  Metabolic acidosis  Dehydration  Influenza A    Rx / DC Orders ED Discharge Orders     None        Deno Etienne, DO 08/03/21 1507

## 2021-08-03 NOTE — ED Notes (Signed)
Pt has been lethargic most of the day. Pt is now awake and drinking sprite. Vd 800 cc clear yellow urine

## 2021-08-03 NOTE — ED Triage Notes (Signed)
Reports he tested positive on Sunday for flu.  Reports he just isn't getting any better.  C/o n/v.  Unable to keep anything down.  Low energy.

## 2021-08-04 DIAGNOSIS — N179 Acute kidney failure, unspecified: Secondary | ICD-10-CM

## 2021-08-04 DIAGNOSIS — Z86711 Personal history of pulmonary embolism: Secondary | ICD-10-CM

## 2021-08-04 DIAGNOSIS — Z86718 Personal history of other venous thrombosis and embolism: Secondary | ICD-10-CM

## 2021-08-04 LAB — GLUCOSE, CAPILLARY
Glucose-Capillary: 141 mg/dL — ABNORMAL HIGH (ref 70–99)
Glucose-Capillary: 146 mg/dL — ABNORMAL HIGH (ref 70–99)
Glucose-Capillary: 150 mg/dL — ABNORMAL HIGH (ref 70–99)
Glucose-Capillary: 170 mg/dL — ABNORMAL HIGH (ref 70–99)
Glucose-Capillary: 175 mg/dL — ABNORMAL HIGH (ref 70–99)
Glucose-Capillary: 179 mg/dL — ABNORMAL HIGH (ref 70–99)
Glucose-Capillary: 183 mg/dL — ABNORMAL HIGH (ref 70–99)
Glucose-Capillary: 184 mg/dL — ABNORMAL HIGH (ref 70–99)
Glucose-Capillary: 197 mg/dL — ABNORMAL HIGH (ref 70–99)

## 2021-08-04 LAB — BASIC METABOLIC PANEL
Anion gap: 9 (ref 5–15)
Anion gap: 9 (ref 5–15)
BUN: 64 mg/dL — ABNORMAL HIGH (ref 8–23)
BUN: 53 mg/dL — ABNORMAL HIGH (ref 8–23)
CO2: 17 mmol/L — ABNORMAL LOW (ref 22–32)
CO2: 20 mmol/L — ABNORMAL LOW (ref 22–32)
Calcium: 8.1 mg/dL — ABNORMAL LOW (ref 8.9–10.3)
Calcium: 8.3 mg/dL — ABNORMAL LOW (ref 8.9–10.3)
Chloride: 106 mmol/L (ref 98–111)
Chloride: 105 mmol/L (ref 98–111)
Creatinine, Ser: 2.08 mg/dL — ABNORMAL HIGH (ref 0.61–1.24)
Creatinine, Ser: 1.85 mg/dL — ABNORMAL HIGH (ref 0.61–1.24)
GFR, Estimated: 34 mL/min — ABNORMAL LOW (ref >60)
GFR, Estimated: 39 mL/min — ABNORMAL LOW (ref >60)
Glucose, Bld: 160 mg/dL — ABNORMAL HIGH (ref 70–99)
Glucose, Bld: 173 mg/dL — ABNORMAL HIGH (ref 70–99)
Potassium: 4.1 mmol/L (ref 3.5–5.1)
Potassium: 4 mmol/L (ref 3.5–5.1)
Sodium: 132 mmol/L — ABNORMAL LOW (ref 135–145)
Sodium: 134 mmol/L — ABNORMAL LOW (ref 135–145)

## 2021-08-04 LAB — MRSA NEXT GEN BY PCR, NASAL: MRSA by PCR Next Gen: NOT DETECTED

## 2021-08-04 LAB — BETA-HYDROXYBUTYRIC ACID: Beta-Hydroxybutyric Acid: 2.17 mmol/L — ABNORMAL HIGH (ref 0.05–0.27)

## 2021-08-04 MED ORDER — INSULIN GLARGINE-YFGN 100 UNIT/ML ~~LOC~~ SOLN
18.0000 [IU] | Freq: Every day | SUBCUTANEOUS | Status: DC
  Administered 2021-08-04 – 2021-08-05 (×2): 18 [IU] via SUBCUTANEOUS
  Filled 2021-08-04 (×2): qty 0.18

## 2021-08-04 MED ORDER — PROSOURCE PLUS PO LIQD
30.0000 mL | Freq: Two times a day (BID) | ORAL | Status: DC
  Administered 2021-08-04 – 2021-08-05 (×3): 30 mL via ORAL
  Filled 2021-08-04 (×3): qty 30

## 2021-08-04 MED ORDER — INSULIN ASPART 100 UNIT/ML IJ SOLN: 0.0000 [IU] | Freq: Every day | INTRAMUSCULAR | Status: DC

## 2021-08-04 MED ORDER — ADULT MULTIVITAMIN W/MINERALS CH
1.0000 | ORAL_TABLET | Freq: Every day | ORAL | Status: DC
  Administered 2021-08-04 – 2021-08-05 (×2): 1 via ORAL
  Filled 2021-08-04 (×2): qty 1

## 2021-08-04 MED ORDER — ENSURE MAX PROTEIN PO LIQD
11.0000 [oz_av] | Freq: Every day | ORAL | Status: DC
  Administered 2021-08-04: 11 [oz_av] via ORAL
  Filled 2021-08-04 (×2): qty 330

## 2021-08-04 MED ORDER — INSULIN ASPART 100 UNIT/ML IJ SOLN
0.0000 [IU] | Freq: Three times a day (TID) | INTRAMUSCULAR | Status: DC
  Administered 2021-08-04 (×2): 2 [IU] via SUBCUTANEOUS
  Administered 2021-08-05: 3 [IU] via SUBCUTANEOUS
  Administered 2021-08-05: 2 [IU] via SUBCUTANEOUS

## 2021-08-04 NOTE — Progress Notes (Signed)
Inpatient Diabetes Program Recommendations  AACE/ADA: New Consensus Statement on Inpatient Glycemic Control (2015)  Target Ranges:  Prepandial:   less than 140 mg/dL      Peak postprandial:   less than 180 mg/dL (1-2 hours)      Critically ill patients:  140 - 180 mg/dL   Lab Results  Component Value Date   GLUCAP 150 (H) 08/04/2021   HGBA1C 8.6 (H) 07/27/2020    Review of Glycemic Control  Diabetes history: DM 2 Outpatient Diabetes medications: Trulicity 1.5 mg Qsunday, Jardiance 25 mg Daily, Levemir 18 units Daily, Metformin 1000 mg bid Current orders for Inpatient glycemic control:  Novolog 0-9 units tid + hs Semglee 18 units Daily  Inpatient Diabetes Program Recommendations:    Note: Beta still elevated at 2.17 and CO2 still low at 17, Gap 9.  DKA most likely exacerbated by pt having the flu and also being on Jardiance, an SGLT-2 inhibitor known to cause DKA.  May want to discontinue at d/c and have pt follow up with prescriber  Thanks,  Tama Headings RN, MSN, BC-ADM Inpatient Diabetes Coordinator Team Pager [phone] (8a-5p)

## 2021-08-04 NOTE — TOC Progression Note (Signed)
Transition of Care [facility]) - Progression Note    Patient Details  Name: [name] MRN: [id number] Date of Birth: [birth year]  [facility]) CM/SW Contact  Purcell Mouton, RN Phone Number: 08/04/2021, 12:16 PM  Clinical Narrative:     TOC will continue to follow for discharge needs.   Expected Discharge Plan: Home/Self Care Barriers to Discharge: No Barriers Identified  Expected Discharge Plan and Services Expected Discharge Plan: Home/Self Care       Living arrangements for the past 2 months: Single Family Home                                       Social Determinants of Health (SDOH) Interventions    Readmission Risk Interventions No flowsheet data found.

## 2021-08-04 NOTE — Progress Notes (Signed)
Initial Nutrition Assessment RD working remotely.  DOCUMENTATION CODES:   Not applicable  INTERVENTION:  - will order Ensure Max once/day, each supplement provides 150 kcal and 30 grams of protein. - will order 30 ml Prosource Plus BID, each supplement provides 100 kcal and 15 grams protein.  - will order 1 tablet multivitamin with minerals/day. - entered "Carbohydrate Counting for People with Diabetes" handout from the Academy of Nutrition [employer] Dietetics in the Discharge Instructions/AVS.  - complete NFPE when feasible.    NUTRITION DIAGNOSIS:   Increased nutrient needs related to acute illness, chronic illness as evidenced by estimated needs.  GOAL:   Patient will meet greater than or equal to 90% of their needs  MONITOR:   PO intake, Supplement acceptance, Labs, Weight trends  REASON FOR ASSESSMENT:   Malnutrition Screening Tool  ASSESSMENT:   65 y.o. male with medical history of PE/DVT, insulin-dependent type 2 DM, R renal carcinoma s/p chemo, XRT, and radial right nephrectomy in 08/2020 on Pembrolizumab, HLD, hypothyroidism, neuropathy, and arthritis. He was admitted for DKA. He was diagnosed with the flu 3 days PTA and started on Tamiflu, but continued to have N/V and decreased appetite; no abdominal pain.  Diet advanced from NPO to Carb Modified today at 0732; no meal intakes documented yet.   Patient was seen by this RD on 08/06/20; the only other inpatient [place] RD assessment. On that date he was POD #1 R radical nephrectomy and retroperitoneal lymph node dissection.   Weight yesterday was 185 lb, weight on 06/28/21 was 190 lb, and weight on 05/18/21 was 195 lb. This indicates 10 lb weight loss (5% body weight) in the past 2.5 months; not significant for time frame.   Per notes: - DKA  - AKI - sepsis 2/2 influenza A   Labs reviewed; CBGs: 146-179 mg/dl, Na: 132 mmol/l, BUN: 64 mg/dl, creatinine: 2.08 mg/dl, Ca: 8.1 mg/dl, GFR: 34 ml/min.  Medications  reviewed; sliding scale novolog, 18 units semglee/day, 5 mg IV reglan x1 dose 11/30, 4 mg IV zofran x1 dose 11/30, 10 mEq IV KCl x2 runs 11/30.  IVF; LR @ 100 ml/hr.     NUTRITION - FOCUSED PHYSICAL EXAM:  RD working remotely.  Diet Order:   Diet Order             Diet Carb Modified Fluid consistency: Thin; Room service appropriate? Yes  Diet effective now                   EDUCATION NEEDS:   Education needs have been addressed  Skin:  Skin Assessment: Reviewed RN Assessment  Last BM:  12/1 (type 7 x1, large amount)  Height:   Ht Readings from Last 1 Encounters:  08/03/21 6\' 3"  (1.905 m)    Weight:   Wt Readings from Last 1 Encounters:  08/03/21 84.1 kg    Estimated Nutritional Needs:  Kcal:  2200-2450 kcal Protein:  110-125 grams Fluid:  >/= 2.5 L/day      Jarome Matin, MS, RD, LDN, CNSC Inpatient Clinical Dietitian RD pager # available in [facility]  After hours/weekend pager # available in [facility]

## 2021-08-04 NOTE — Evaluation (Signed)
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  Clinical Impression  The patient is eager to ambulate in hall. Patient ambulated x 250-' pushing IV pole. The patient is mildly unsteady.   Patient has Bed/bath on second floor. Encouraged  patient to ask staff to ambulate again today. Pt admitted with above diagnosis.  Pt currently with functional limitations due to the deficits listed below (see PT Problem List). Pt will benefit from skilled PT to increase their independence and safety with mobility to allow discharge to the venue listed below.          Recommendations for follow up therapy are one component of a multi-disciplinary discharge planning process, led by the attending physician.  Recommendations may be updated based on patient status, additional functional criteria and insurance authorization.  Follow Up Recommendations No PT follow up    Assistance Recommended at Discharge Intermittent Supervision/Assistance  Functional Status Assessment Patient has had a recent decline in their functional status and demonstrates the ability to make significant improvements in function in a reasonable and predictable amount of time.  Equipment Recommendations  None recommended by PT    Recommendations for Other Services       Precautions / Restrictions Precautions Precautions: Fall      Mobility  Bed Mobility               General bed mobility comments: in recliner    Transfers Overall transfer level: Needs assistance   Transfers: Sit to/from Stand Sit to Stand: Min guard           General transfer comment: from recliner     Ambulation/Gait Ambulation/Gait assistance: Min guard Gait Distance (Feet): 250 Feet Assistive device: IV Pole Gait Pattern/deviations: Step-through pattern Gait velocity: decr     General Gait Details: slow speed, relied on  IV pole  Stairs            Wheelchair Mobility    Modified Rankin (Stroke Patients Only)       Balance Overall balance assessment: Mild deficits observed, not formally tested                                           Pertinent Vitals/Pain Pain Assessment: No/denies pain    Home Living Family/patient expects to be discharged to:: Private residence Living Arrangements: Spouse/significant other Available Help at Discharge: Family Type of Home: House       Alternate Level Stairs-Number of Steps: 14 Home Layout: Two level Home Equipment: None      Prior Function Prior Level of Function : Independent/Modified Independent             Mobility Comments: works Warehouse manager, Technical sales engineer        Extremity/Trunk Assessment   Upper Extremity Assessment Upper Extremity Assessment: Overall WFL for tasks assessed    Lower Extremity Assessment Lower Extremity Assessment: Generalized weakness    Cervical / Trunk Assessment Cervical / Trunk Assessment: Normal  Communication   Communication: No difficulties  Cognition Arousal/Alertness: Awake/alert Behavior During Therapy: [facility]  for tasks assessed/performed Overall Cognitive Status: Within Functional Limits for tasks assessed                                          General Comments      Exercises     Assessment/Plan    PT Assessment Patient needs continued PT services  PT Problem List Decreased strength;Decreased mobility;Decreased safety awareness;Decreased activity tolerance;Decreased knowledge of precautions;Decreased balance;Decreased knowledge of use of DME       PT Treatment Interventions Therapeutic  activities;Gait training;Therapeutic exercise;Patient/family education;Functional mobility training    PT Goals (Current goals can be found in the Care Plan section)  Acute Rehab PT Goals Patient Stated Goal: to feel stronger before i go home PT Goal Formulation: With patient/family Time For Goal Achievement: 08/18/21 Potential to Achieve Goals: Good    Frequency Min 3X/week   Barriers to discharge        Co-evaluation               AM-PAC PT "6 Clicks" Mobility  Outcome Measure Help needed turning from your back to your side while in a flat bed without using bedrails?: A Little Help needed moving from lying on your back to sitting on the side of a flat bed without using bedrails?: A Little Help needed moving to and from a bed to a chair (including a wheelchair)?: A Little Help needed standing up from a chair using your arms (e.g., wheelchair or bedside chair)?: A Little Help needed to walk in hospital room?: A Little Help needed climbing 3-5 steps with a railing? : A Lot 6 Click Score: 17    End of Session   Activity Tolerance: Patient tolerated treatment well Patient left: in chair;with call bell/phone within reach;with family/visitor present Nurse Communication: Mobility status PT Visit Diagnosis: Unsteadiness on feet (R26.81);Difficulty in walking, not elsewhere classified (R26.2)    Time: 1539-1600 PT Time Calculation (min) (ACUTE ONLY): 21 min   Charges:   PT Evaluation $PT Eval Low Complexity: [place] [facility] Acute Rehabilitation Services Pager [phone] Office [phone]   Claretha Cooper 08/04/2021, 4:43 PM

## 2021-08-04 NOTE — Discharge Instructions (Signed)

## 2021-08-04 NOTE — Progress Notes (Addendum)
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  He was recently diagnosed with flu.  He has poor appetite.  Patient was also found to be in DKA on presentation and was sent for admission here.  On presentation lab work showed anion gap of 30, blood sugar of 457, potassium 5.5 creatinine of 3.2, WBC count of 13,000.Marland Johnson  Patient was admitted for the management of DKA,  Assessment & Plan:   Principal Problem:   DKA (diabetic ketoacidosis) ([place]) Active Problems:   Diabetes mellitus type 2 in nonobese [name])   Renal cell carcinoma, right ([place])   AKI (acute kidney injury) ([place])   History of DVT (deep vein thrombosis)   History of pulmonary embolus (PE)   Insulin-dependent diabetes type 2/DKA: Presented with nausea, vomiting, weakness, poor oral intake.  Started on insulin drip on presentation.  Follow-up hemoglobin A1c.  Gap is closed.  We will start on sliding scale and Lantus.   He takes Trulicity, Jardiance, Levemir, metformin at home.  We will discontinue metformin and Jardiance on discharge, which could have caused DKA Monitor blood sugars  AKI on CKD stage IIIb: Baseline creatinine around 2.  Presented with creatinine of more than 3.  Most likely secondary to volume loss secondary to vomiting.  Kidney function has improved with IV fluids and currently at baseline.  He still looks dehydrated  SIRS/influenza: Presented with tachycardia, leukocytosis.  Recently diagnosed with influenza A.  Taking Tamiflu.  We will continue  History of PE/DVT: On Xarelto  Hyponatremia: Mild, most likely due to  dehydration.  Continue to monitor.  History of right renal cell carcinoma: Status post radical nephrectomy.  Follows with Dr. Alen Blew, oncology.  On pembrolizumab.  We recommend outpatient follow-up  Weakness: Deconditioning due to current medical conditions.  Physical therapy consulted     DVT prophylaxis:Xarelto Code Status: Full Family Communication: Discussed with wife at bedside Patient status:Inpatient  Dispo: The patient is from: Home              Anticipated d/c is to: Home              Anticipated d/c date is: tomorrow  Consultants: None  Procedures:None  Antimicrobials:  Anti-infectives (From admission, onward)    Start     Dose/Rate Route Frequency Ordered Stop   08/03/21 2300  oseltamivir (TAMIFLU) capsule 30 mg  Status:  Discontinued        30 mg Oral 2 times daily 08/03/21 2210 08/03/21 2210   08/03/21 2300  oseltamivir (TAMIFLU) capsule 30 mg        30 mg Oral 2 times daily 08/03/21 2210 08/05/21 2159       Subjective: Patient seen and examined at the bedside this morning.  Hemodynamically stable.  Looks weak, deconditioned, dehydrated.  Sitting on the chair.  She says he feels better, hemodynamically stable  Objective: Vitals:   08/04/21 0400 08/04/21 0500 08/04/21 0600 08/04/21 0700  BP: 97/62 (!) 101/59 (!) 100/59 116/67  Pulse: 94 91 93 100  Resp: (!) 22 20 (!) 21 (!) 22  Temp: 98 F (36.7 C)     TempSrc: Oral  SpO2: 90% 90% 92% 92%  Weight:      Height:        Intake/Output Summary (Last 24 hours) at 08/04/2021 0725 Last data filed at 08/04/2021 0400 Gross per 24 hour  Intake 3497.38 ml  Output 1550 ml  Net 1947.38 ml   Filed Weights   08/03/21 1021 08/03/21 1851  Weight: 86.2 kg 84.1 kg    Examination:  General exam: Weak, chronically ill looking HEENT: PERRL, dry mouth Respiratory system:  no wheezes or crackles  Cardiovascular system: S1 & S2 heard, RRR.  Gastrointestinal system: Abdomen is nondistended, soft and  nontender. Central nervous system: Alert and oriented Extremities: No edema, no clubbing ,no cyanosis Skin: No rashes, no ulcers,no icterus     Data Reviewed: I have personally reviewed following labs and imaging studies  CBC: Recent Labs  Lab 08/03/21 1112 08/03/21 1400  WBC 13.0*  --   NEUTROABS 11.1*  --   HGB 14.7 15.3  HCT 45.6 45.0  MCV 88.0  --   PLT 226  --    Basic Metabolic Panel: Recent Labs  Lab 08/03/21 1112 08/03/21 1400 08/03/21 1522 08/03/21 2103 08/04/21 0054  NA 127* 127* 133* 133* 132*  K 5.6* 5.2* 4.6 4.6 4.1  CL 89*  --  99 105 106  CO2 8*  --  14* 15* 17*  GLUCOSE 457*  --  267* 150* 160*  BUN 85*  --  77* 73* 64*  CREATININE 3.20*  --  2.90* 2.37* 2.08*  CALCIUM 9.5  --  8.1* 8.1* 8.1*  MG 2.6*  --   --   --   --    GFR: Estimated Creatinine Clearance: 41 mL/min (A) (by C-G formula based on SCr of 2.08 mg/dL (H)). Liver Function Tests: Recent Labs  Lab 08/03/21 1112  AST 19  ALT 20  ALKPHOS 71  BILITOT 0.7  PROT 7.1  ALBUMIN 4.1   No results for input(s): LIPASE, AMYLASE in the last 168 hours. No results for input(s): AMMONIA in the last 168 hours. Coagulation Profile: No results for input(s): INR, PROTIME in the last 168 hours. Cardiac Enzymes: No results for input(s): CKTOTAL, CKMB, CKMBINDEX, TROPONINI in the last 168 hours. BNP (last 3 results) No results for input(s): PROBNP in the last 8760 hours. HbA1C: No results for input(s): HGBA1C in the last 72 hours. CBG: Recent Labs  Lab 08/03/21 2324 08/04/21 0037 08/04/21 0210 08/04/21 0316 08/04/21 0521  GLUCAP 129* 179* 146* 170* 175*   Lipid Profile: No results for input(s): CHOL, HDL, LDLCALC, TRIG, CHOLHDL, LDLDIRECT in the last 72 hours. Thyroid Function Tests: No results for input(s): TSH, T4TOTAL, FREET4, T3FREE, THYROIDAB in the last 72 hours. Anemia Panel: No results for input(s): VITAMINB12, FOLATE, FERRITIN, TIBC, IRON, RETICCTPCT in the last 72  hours. Sepsis Labs: Recent Labs  Lab 08/03/21 1316 08/03/21 1522  LATICACIDVEN 1.5 1.2    Recent Results (from the past 240 hour(s))  Resp Panel by RT-PCR (Flu A&B, Covid) Nasopharyngeal Swab     Status: Abnormal   Collection Time: 08/03/21  5:43 PM   Specimen: Nasopharyngeal Swab; Nasopharyngeal(NP) swabs in vial transport medium  Result Value Ref Range Status   SARS Coronavirus 2 by RT PCR NEGATIVE NEGATIVE Final    Comment: (NOTE) SARS-CoV-2 target nucleic acids are NOT DETECTED.  The SARS-CoV-2 RNA is generally detectable in upper respiratory specimens during the acute phase of infection. The lowest concentration of SARS-CoV-2 viral copies this assay can detect is 138  copies/mL. A negative result does not preclude SARS-Cov-2 infection and should not be used as the sole basis for treatment or other patient management decisions. A negative result may occur with  improper specimen collection/handling, submission of specimen other than nasopharyngeal swab, presence of viral mutation(s) within the areas targeted by this assay, and inadequate number of viral copies(<138 copies/mL). A negative result must be combined with clinical observations, patient history, and epidemiological information. The expected result is Negative.  Fact Sheet for Patients:  EntrepreneurPulse.com.au  Fact Sheet for Healthcare Providers:  IncredibleEmployment.be  This test is no t yet approved or cleared by the [place] FDA and  has been authorized for detection and/or diagnosis of SARS-CoV-2 by FDA under an Emergency Use Authorization (EUA). This EUA will remain  in effect (meaning this test can be used) for the duration of the COVID-19 declaration under Section 564(b)(1) of the Act, 21 U.S.C.section 360bbb-3(b)(1), unless the authorization is terminated  or revoked sooner.       Influenza A by PCR POSITIVE (A) NEGATIVE Final   Influenza B by PCR NEGATIVE  NEGATIVE Final    Comment: (NOTE) The Xpert Xpress SARS-CoV-2/FLU/RSV plus assay is intended as an aid in the diagnosis of influenza from Nasopharyngeal swab specimens and should not be used as a sole basis for treatment. Nasal washings and aspirates are unacceptable for Xpert Xpress SARS-CoV-2/FLU/RSV testing.  Fact Sheet for Patients: EntrepreneurPulse.com.au  Fact Sheet for Healthcare Providers: IncredibleEmployment.be  This test is not yet approved or cleared by the [place] FDA and has been authorized for detection and/or diagnosis of SARS-CoV-2 by FDA under an Emergency Use Authorization (EUA). This EUA will remain in effect (meaning this test can be used) for the duration of the COVID-19 declaration under Section 564(b)(1) of the Act, 21 U.S.C. section 360bbb-3(b)(1), unless the authorization is terminated or revoked.  Performed at [facility], [address], [place], [place] [address]   MRSA Next Gen by PCR, Nasal     Status: None   Collection Time: 08/03/21  7:00 PM   Specimen: Nasal Mucosa; Nasal Swab  Result Value Ref Range Status   MRSA by PCR Next Gen NOT DETECTED NOT DETECTED Final    Comment: (NOTE) The GeneXpert MRSA Assay (FDA approved for NASAL specimens only), is one component of a comprehensive MRSA colonization surveillance program. It is not intended to diagnose MRSA infection nor to guide or monitor treatment for MRSA infections. Test performance is not FDA approved in patients less than 75 years old. Performed at [facility], [place] [address] [place], [place] [address]          Radiology Studies: No results found.      Scheduled Meds:  Chlorhexidine Gluconate Cloth  6 each Topical Daily   ezetimibe  10 mg Oral QHS   And   simvastatin  20 mg Oral QHS   levothyroxine  75 mcg Oral Q0600   mouth rinse  15 mL Mouth Rinse BID   oseltamivir  30 mg Oral BID   rivaroxaban  10  mg Oral Daily   Continuous Infusions:  dextrose 5% lactated ringers 125 mL/hr at 08/04/21 0400   insulin 2.3 Units/hr (08/04/21 0400)   lactated ringers       LOS: 1 day    Time spent: 35 mins,More than 50% of that time was spent in counseling and/or coordination of care.      Shelly Coss, MD [facility] P12/09/2020, 7:25 AM

## 2021-08-05 LAB — BASIC METABOLIC PANEL
Anion gap: 9 (ref 5–15)
BUN: 31 mg/dL — ABNORMAL HIGH (ref 8–23)
CO2: 21 mmol/L — ABNORMAL LOW (ref 22–32)
Calcium: 8.3 mg/dL — ABNORMAL LOW (ref 8.9–10.3)
Chloride: 104 mmol/L (ref 98–111)
Creatinine, Ser: 1.52 mg/dL — ABNORMAL HIGH (ref 0.61–1.24)
GFR, Estimated: 50 mL/min — ABNORMAL LOW (ref >60)
Glucose, Bld: 186 mg/dL — ABNORMAL HIGH (ref 70–99)
Potassium: 3.6 mmol/L (ref 3.5–5.1)
Sodium: 134 mmol/L — ABNORMAL LOW (ref 135–145)

## 2021-08-05 LAB — GLUCOSE, CAPILLARY
Glucose-Capillary: 185 mg/dL — ABNORMAL HIGH (ref 70–99)
Glucose-Capillary: 242 mg/dL — ABNORMAL HIGH (ref 70–99)

## 2021-08-05 LAB — HEMOGLOBIN A1C
Hgb A1c MFr Bld: 12.2 % — ABNORMAL HIGH (ref 4.8–5.6)
Mean Plasma Glucose: 303 mg/dL

## 2021-08-05 MED ORDER — LEVEMIR FLEXTOUCH 100 UNIT/ML ~~LOC~~ SOPN: 22.0000 [IU] | PEN_INJECTOR | Freq: Every day | SUBCUTANEOUS | 4 refills | Status: DC

## 2021-08-05 NOTE — Progress Notes (Signed)
Inpatient Diabetes Program Recommendations  AACE/ADA: New Consensus Statement on Inpatient Glycemic Control (2015)  Target Ranges:  Prepandial:   less than 140 mg/dL      Peak postprandial:   less than 180 mg/dL (1-2 hours)      Critically ill patients:  140 - 180 mg/dL   Lab Results  Component Value Date   GLUCAP 185 (H) 08/05/2021   HGBA1C 12.2 (H) 08/03/2021    Review of Glycemic Control  Diabetes history: DM 2 Outpatient Diabetes medications: Trulicity 1.5 mg Qsunday, Jardiance 25 mg Daily, Levemir 18 units Daily, Metformin 1000 mg bid Current orders for Inpatient glycemic control:  Novolog 0-9 units tid + hs Semglee 18 units Daily  Spoke with pt at bedside regarding A1c of 12.2%and glucose control at home. Pt reports checking his glucose once a day and it's usually in the 20o range. P has had a recent A1c of 10% (which is probably his baseline). Discussed glucose and A1c goals. Reviewed lifestyle modifications with beverage options and diet.   Reviewed d/c changes with DM medications and why. Pt to follow up with prescriber of jardiance and metformin. Discussed increase in levemir dosing for home.   Thanks,  Tama Headings RN, MSN, BC-ADM Inpatient Diabetes Coordinator Team Pager [phone] (8a-5p)

## 2021-08-05 NOTE — Progress Notes (Signed)
Discharge instructions provided and discussed with patient and wife. They each demonstrated understanding. IV removed. Pt dressed. Taken to entrance via wheelchair by NT at 1245

## 2021-08-05 NOTE — Discharge Summary (Signed)
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  He was recently diagnosed with flu.  He has poor appetite.  Patient was also found to be in DKA on presentation and was sent for admission here.  On presentation lab work showed anion gap of 30, blood sugar of 457, potassium 5.5 creatinine of 3.2, WBC count of 13,000.[name]  Patient was admitted for the management of DKA.  DKA has resolved.  He looks much better today and feels good.  He is medically stable for discharge home today.  Following problems were addressed during his hospitalization:  Uncontrolled insulin-dependent diabetes type 2/DKA: Presented with nausea, vomiting, weakness, poor oral intake.  Started on insulin drip on presentation.  Follow-up hemoglobin A1c.  Gap i closed. Started on sliding scale and Lantus.   He takes Trulicity, Jardiance, Levemir, metformin at home.  We will discontinue Jardiance on discharge, which could have caused DKA Hemoglobin A1c now found to be in the range of 12.  We recommend to increase the dose of Levemir to 22 units daily and follow-up closely with his PCP, monitor blood sugars at home   AKI on CKD stage IIIb: Baseline creatinine around 2.  Presented with creatinine of more than 3.  Most likely secondary to volume loss secondary to vomiting.  Kidney function has improved with IV fluids and currently at baseline.  He follows with Dr. Candiss Norse, nephrology    SIRS/influenza: Presented with tachycardia, leukocytosis.  Recently diagnosed with influenza A. Was taking Tamiflu.  Respiratory status is stable.   History of PE/DVT: On Xarelto   Hyponatremia: Mild, most likely due to dehydration. Stable now   History of right renal cell carcinoma: Status post radical nephrectomy.  Follows with Dr. Alen Blew, oncology.  On pembrolizumab.  We recommend outpatient follow-up   Weakness: Deconditioning due to current medical conditions.  Physical therapy consulted,no follow up recommended      Discharge Diagnoses:  Principal Problem:   DKA (diabetic ketoacidosis) ([place]) Active Problems:   Diabetes mellitus type 2 in nonobese [facility])   Renal cell carcinoma, right ([place])   AKI (acute kidney injury) ([place])   History of DVT (deep vein thrombosis)   History of pulmonary embolus (PE)    Discharge Instructions  Discharge Instructions     Diet Carb Modified   Complete by: As directed    Discharge instructions   Complete by: As directed    1)Please take your medications as instructed 2)Follow up with your PCP in a week.  Do a BMP test during the follow-up.  Monitor your blood sugars at home. 3)Follow up with your nephrologist as an outpatient   Increase activity slowly   Complete by: As directed       Allergies as of 08/05/2021   No Known Allergies      Medication List     STOP taking these medications    Jardiance 25 MG Tabs tablet Generic drug: empagliflozin   oseltamivir 75 MG capsule Commonly known as: TAMIFLU       TAKE these medications  ezetimibe-simvastatin 10-20 MG tablet Commonly known as: Vytorin Take 1 tablet by mouth once daily at night.   Levemir FlexTouch 100 UNIT/ML FlexPen Generic drug: insulin detemir Inject 22 Units into the skin daily. What changed:  how much to take when to take this   levothyroxine 75 MCG tablet Commonly known as: SYNTHROID Take 1 tablet by mouth every morning on an empty stomach    losartan 25 MG tablet Commonly known as: COZAAR Take 1/2 tablet by mouth at bedtime.   metFORMIN 500 MG 24 hr tablet Commonly known as: GLUCOPHAGE-XR Take 2 tablets by mouth with food Twice a day   NovoTwist Pen Needle 32G X 5 MM Misc Generic drug: Insulin Pen Needle INJECT AS DIRECTED ONCE DAILY   Comfort EZ Pen Needles 32G X 5 MM Misc Generic drug: Insulin Pen Needle Inject as directed once daily   Trulicity 1.5 BW/6.2MB Sopn Generic drug: Dulaglutide inject 1 pen under the skin once a week   Xarelto 10 MG Tabs tablet Generic drug: rivaroxaban Take 1 tablet (10 mg total) by mouth daily with food        Follow-up Information     [name], MD. Schedule an appointment as soon as possible for a visit in 1 week(s).   Specialty: Family Medicine Contact information: [address] [place] [address] [phone]                No Known Allergies  Consultations: None   Procedures/Studies: No results found.    Subjective: Patient seen and examined at the bedside this morning.  Hemodynamically stable for discharge today.  Discharge Exam: Vitals:   08/05/21 0837 08/05/21 0945  BP:  (!) 164/83  Pulse:  95  Resp:  (!) 27  Temp: 98.3 F (36.8 C)   SpO2:  95%   Vitals:   08/05/21 0400 08/05/21 0431 08/05/21 0837 08/05/21 0945  BP: 135/73   (!) 164/83  Pulse: 88   95  Resp: 20   (!) 27  Temp: (!) 97.2 F (36.2 C) (!) 97.2 F (36.2 C) 98.3 F (36.8 C)   TempSrc: Axillary Axillary Axillary   SpO2: 97%   95%  Weight:      Height:        General: Pt is alert, awake, not in acute distress Cardiovascular: RRR, S1/S2 +, no rubs, no gallops Respiratory: CTA bilaterally, no wheezing, no rhonchi Abdominal: Soft, NT, ND, bowel sounds + Extremities: no edema, no cyanosis    The results of significant diagnostics from this hospitalization (including imaging, microbiology, ancillary and laboratory) are listed below for reference.      Microbiology: Recent Results (from the past 240 hour(s))  Resp Panel by RT-PCR (Flu A&B, Covid) Nasopharyngeal Swab     Status: Abnormal   Collection Time: 08/03/21  5:43 PM   Specimen: Nasopharyngeal Swab; Nasopharyngeal(NP) swabs in vial transport medium  Result Value Ref Range Status   SARS Coronavirus 2 by RT PCR NEGATIVE NEGATIVE Final    Comment: (NOTE) SARS-CoV-2 target nucleic acids are NOT DETECTED.  The SARS-CoV-2 RNA is generally detectable in upper respiratory specimens during the acute phase of infection. The lowest concentration of SARS-CoV-2 viral copies this assay can detect is 138 copies/mL. A negative result does not preclude SARS-Cov-2 infection and should not be used as the sole basis for treatment or other patient management decisions. A negative result may occur with  improper specimen collection/handling, submission of specimen other than nasopharyngeal swab, presence of viral  mutation(s) within the areas targeted by this assay, and inadequate number of viral copies(<138 copies/mL). A negative result must be combined with clinical observations, patient history, and epidemiological information. The expected result is Negative.  Fact Sheet for Patients:  EntrepreneurPulse.com.au  Fact Sheet for Healthcare Providers:  IncredibleEmployment.be  This test is no t yet approved or cleared by the [place] FDA and  has been authorized for detection and/or diagnosis of SARS-CoV-2 by FDA under an Emergency Use Authorization (EUA). This EUA will remain  in effect (meaning this test can be used) for the duration of the COVID-19 declaration under Section 564(b)(1) of the Act, 21 U.S.C.section 360bbb-3(b)(1), unless the authorization is terminated  or revoked sooner.       Influenza A by PCR POSITIVE (A) NEGATIVE Final   Influenza B by PCR NEGATIVE NEGATIVE Final    Comment: (NOTE) The Xpert Xpress SARS-CoV-2/FLU/RSV plus  assay is intended as an aid in the diagnosis of influenza from Nasopharyngeal swab specimens and should not be used as a sole basis for treatment. Nasal washings and aspirates are unacceptable for Xpert Xpress SARS-CoV-2/FLU/RSV testing.  Fact Sheet for Patients: EntrepreneurPulse.com.au  Fact Sheet for Healthcare Providers: IncredibleEmployment.be  This test is not yet approved or cleared by the [place] FDA and has been authorized for detection and/or diagnosis of SARS-CoV-2 by FDA under an Emergency Use Authorization (EUA). This EUA will remain in effect (meaning this test can be used) for the duration of the COVID-19 declaration under Section 564(b)(1) of the Act, 21 U.S.C. section 360bbb-3(b)(1), unless the authorization is terminated or revoked.  Performed at [facility], [address] [place], [place] [address]   MRSA Next Gen by PCR, Nasal     Status: None   Collection Time: 08/03/21  7:00 PM   Specimen: Nasal Mucosa; Nasal Swab  Result Value Ref Range Status   MRSA by PCR Next Gen NOT DETECTED NOT DETECTED Final    Comment: (NOTE) The GeneXpert MRSA Assay (FDA approved for NASAL specimens only), is one component of a comprehensive MRSA colonization surveillance program. It is not intended to diagnose MRSA infection nor to guide or monitor treatment for MRSA infections. Test performance is not FDA approved in patients less than 7 years old. Performed at [facility], [place] [address], [place], [place] [address]      Labs: BNP (last 3 results) No results for input(s): BNP in the last 8760 hours. Basic Metabolic Panel: Recent Labs  Lab 08/03/21 1112 08/03/21 1400 08/03/21 1522 08/03/21 2103 08/04/21 0054 08/04/21 0855 08/05/21 0233  NA 127*   < > 133* 133* 132* 134* 134*  K 5.6*   < > 4.6 4.6 4.1 4.0 3.6  CL 89*  --  99 105 106 105 104  CO2 8*  --  14* 15* 17* 20* 21*  GLUCOSE 457*  --   267* 150* 160* 173* 186*  BUN 85*  --  77* 73* 64* 53* 31*  CREATININE 3.20*  --  2.90* 2.37* 2.08* 1.85* 1.52*  CALCIUM 9.5  --  8.1* 8.1* 8.1* 8.3* 8.3*  MG 2.6*  --   --   --   --   --   --    < > = values in this interval not displayed.   Liver Function Tests: Recent Labs  Lab 08/03/21 1112  AST 19  ALT 20  ALKPHOS 71  BILITOT 0.7  PROT 7.1  ALBUMIN 4.1   No results for input(s):  LIPASE, AMYLASE in the last 168 hours. No results for input(s): AMMONIA in the last 168 hours. CBC: Recent Labs  Lab 08/03/21 1112 08/03/21 1400  WBC 13.0*  --   NEUTROABS 11.1*  --   HGB 14.7 15.3  HCT 45.6 45.0  MCV 88.0  --   PLT 226  --    Cardiac Enzymes: No results for input(s): CKTOTAL, CKMB, CKMBINDEX, TROPONINI in the last 168 hours. BNP: Invalid input(s): POCBNP CBG: Recent Labs  Lab 08/04/21 0734 08/04/21 1120 08/04/21 1626 08/04/21 2104 08/05/21 0745  GLUCAP 150* 184* 183* 197* 185*   D-Dimer No results for input(s): DDIMER in the last 72 hours. Hgb A1c Recent Labs    08/03/21 2103  HGBA1C 12.2*   Lipid Profile No results for input(s): CHOL, HDL, LDLCALC, TRIG, CHOLHDL, LDLDIRECT in the last 72 hours. Thyroid function studies No results for input(s): TSH, T4TOTAL, T3FREE, THYROIDAB in the last 72 hours.  Invalid input(s): FREET3 Anemia work up No results for input(s): VITAMINB12, FOLATE, FERRITIN, TIBC, IRON, RETICCTPCT in the last 72 hours. Urinalysis    Component Value Date/Time   COLORURINE RED (A) 06/06/2020 1108   APPEARANCEUR CLEAR 06/06/2020 1108   LABSPEC 1.000 (L) 06/06/2020 1108   PHURINE 6.0 06/06/2020 1108   GLUCOSEU >=500 (A) 06/06/2020 1108   HGBUR MODERATE (A) 06/06/2020 1108   BILIRUBINUR NEGATIVE 06/06/2020 1108   KETONESUR 5 (A) 06/06/2020 1108   PROTEINUR 100 (A) 06/06/2020 1108   NITRITE NEGATIVE 06/06/2020 1108   LEUKOCYTESUR NEGATIVE 06/06/2020 1108   Sepsis Labs Invalid input(s): PROCALCITONIN,  WBC,   LACTICIDVEN Microbiology Recent Results (from the past 240 hour(s))  Resp Panel by RT-PCR (Flu A&B, Covid) Nasopharyngeal Swab     Status: Abnormal   Collection Time: 08/03/21  5:43 PM   Specimen: Nasopharyngeal Swab; Nasopharyngeal(NP) swabs in vial transport medium  Result Value Ref Range Status   SARS Coronavirus 2 by RT PCR NEGATIVE NEGATIVE Final    Comment: (NOTE) SARS-CoV-2 target nucleic acids are NOT DETECTED.  The SARS-CoV-2 RNA is generally detectable in upper respiratory specimens during the acute phase of infection. The lowest concentration of SARS-CoV-2 viral copies this assay can detect is 138 copies/mL. A negative result does not preclude SARS-Cov-2 infection and should not be used as the sole basis for treatment or other patient management decisions. A negative result may occur with  improper specimen collection/handling, submission of specimen other than nasopharyngeal swab, presence of viral mutation(s) within the areas targeted by this assay, and inadequate number of viral copies(<138 copies/mL). A negative result must be combined with clinical observations, patient history, and epidemiological information. The expected result is Negative.  Fact Sheet for Patients:  EntrepreneurPulse.com.au  Fact Sheet for Healthcare Providers:  IncredibleEmployment.be  This test is no t yet approved or cleared by the [place] FDA and  has been authorized for detection and/or diagnosis of SARS-CoV-2 by FDA under an Emergency Use Authorization (EUA). This EUA will remain  in effect (meaning this test can be used) for the duration of the COVID-19 declaration under Section 564(b)(1) of the Act, 21 U.S.C.section 360bbb-3(b)(1), unless the authorization is terminated  or revoked sooner.       Influenza A by PCR POSITIVE (A) NEGATIVE Final   Influenza B by PCR NEGATIVE NEGATIVE Final    Comment: (NOTE) The Xpert Xpress SARS-CoV-2/FLU/RSV  plus assay is intended as an aid in the diagnosis of influenza from Nasopharyngeal swab specimens and should not be used as a sole basis  for treatment. Nasal washings and aspirates are unacceptable for Xpert Xpress SARS-CoV-2/FLU/RSV testing.  Fact Sheet for Patients: EntrepreneurPulse.com.au  Fact Sheet for Healthcare Providers: IncredibleEmployment.be  This test is not yet approved or cleared by the [place] FDA and has been authorized for detection and/or diagnosis of SARS-CoV-2 by FDA under an Emergency Use Authorization (EUA). This EUA will remain in effect (meaning this test can be used) for the duration of the COVID-19 declaration under Section 564(b)(1) of the Act, 21 U.S.C. section 360bbb-3(b)(1), unless the authorization is terminated or revoked.  Performed at [facility], [address], [place], [place] [address]   MRSA Next Gen by PCR, Nasal     Status: None   Collection Time: 08/03/21  7:00 PM   Specimen: Nasal Mucosa; Nasal Swab  Result Value Ref Range Status   MRSA by PCR Next Gen NOT DETECTED NOT DETECTED Final    Comment: (NOTE) The GeneXpert MRSA Assay (FDA approved for NASAL specimens only), is one component of a comprehensive MRSA colonization surveillance program. It is not intended to diagnose MRSA infection nor to guide or monitor treatment for MRSA infections. Test performance is not FDA approved in patients less than 109 years old. Performed at [facility], [place] [address], [place], [place] [address]     Please note: You were cared for by a hospitalist during your hospital stay. Once you are discharged, your primary care physician will handle any further medical issues. Please note that NO REFILLS for any discharge medications will be authorized once you are discharged, as it is imperative that you return to your primary care physician (or establish a relationship with a primary care  physician if you do not have one) for your post hospital discharge needs so that they can reassess your need for medications and monitor your lab values.    Time coordinating discharge: 40 minutes  SIGNED:   Shelly Coss, MD  [facility] 08/05/2021, 11:45 AM Pager [phone]  If 7PM-7AM, please contact night-coverage [email or web address] Password [email or web address]

## 2021-08-08 ENCOUNTER — Telehealth: Payer: Self-pay | Admitting: *Deleted

## 2021-08-08 ENCOUNTER — Other Ambulatory Visit: Payer: Self-pay | Admitting: *Deleted

## 2021-08-08 DIAGNOSIS — E1169 Type 2 diabetes mellitus with other specified complication: Secondary | ICD-10-CM | POA: Diagnosis not present

## 2021-08-08 DIAGNOSIS — R944 Abnormal results of kidney function studies: Secondary | ICD-10-CM | POA: Diagnosis not present

## 2021-08-08 NOTE — Patient Outreach (Signed)
[facility] [facility]) Care Management  08/08/2021  RIDLEY SCHEWE 25-Jul-1956 [id number]   Transition of care call   Referral received: 12/5 Initial outreach:12/5 Insurance: [facility] Focus/Save/Choice Plan   Subjective: Initial successful telephone call to patient's preferred number in order to complete transition of care assessment; 2 HIPAA identifiers verified. Explained purpose of call and completed transition of care assessment.  States he is doing well, denies post op problems, states DM is better managed with prescribed medications, tolerating  diet , denies bowel or bladder problems.  Spouse/children are assisting  with his/her recovery.      Objective:  Mr. Lafond was hospitalized at [facility] from 11/30-12/2 for DKA. Comorbidities include: PE, DVT, Hypothyroidism, DM with A1C of 12.2, Renal carcinoma, and depression. He was discharged to home on 12/2 without the need for home health services or DME.   Assessment:  Patient voices good understanding of all discharge instructions.  He is monitoring blood sugars daily and following medication changes. Today's reading was 170.  He is looking to increase exercise/walking once he has better energy, recovering from flu.  See transition of care flowsheet for assessment details.   Plan:   RNCM will route successful outreach letter with [place] Management pamphlet and 24 Hour Nurse Line Magnet to [facility] Management clinical pool to be mailed to patient's home address. Will also send DM education and follow up within the next 2 weeks.  Valente David, [place], MSN [place] [phone]

## 2021-08-08 NOTE — Telephone Encounter (Signed)
Notified that we have cancelled this weeks appts. Will see him in January as scheduled.

## 2021-08-08 NOTE — Telephone Encounter (Signed)
[name] left a message stating he was in stepdown ICU last week with the flu. Is still not feeling well, wants to know if he should come for Keytruda on Thursday.

## 2021-08-10 DIAGNOSIS — Z09 Encounter for follow-up examination after completed treatment for conditions other than malignant neoplasm: Secondary | ICD-10-CM | POA: Diagnosis not present

## 2021-08-10 DIAGNOSIS — R944 Abnormal results of kidney function studies: Secondary | ICD-10-CM | POA: Diagnosis not present

## 2021-08-10 DIAGNOSIS — R899 Unspecified abnormal finding in specimens from other organs, systems and tissues: Secondary | ICD-10-CM | POA: Diagnosis not present

## 2021-08-10 DIAGNOSIS — N179 Acute kidney failure, unspecified: Secondary | ICD-10-CM | POA: Diagnosis not present

## 2021-08-10 DIAGNOSIS — R7309 Other abnormal glucose: Secondary | ICD-10-CM | POA: Diagnosis not present

## 2021-08-10 DIAGNOSIS — E1169 Type 2 diabetes mellitus with other specified complication: Secondary | ICD-10-CM | POA: Diagnosis not present

## 2021-08-10 DIAGNOSIS — E1310 Other specified diabetes mellitus with ketoacidosis without coma: Secondary | ICD-10-CM | POA: Diagnosis not present

## 2021-08-10 DIAGNOSIS — R0989 Other specified symptoms and signs involving the circulatory and respiratory systems: Secondary | ICD-10-CM | POA: Diagnosis not present

## 2021-08-11 ENCOUNTER — Other Ambulatory Visit: Payer: Self-pay | Admitting: Family Medicine

## 2021-08-11 ENCOUNTER — Ambulatory Visit: Payer: 59

## 2021-08-11 ENCOUNTER — Other Ambulatory Visit: Payer: 59

## 2021-08-11 ENCOUNTER — Ambulatory Visit: Payer: 59 | Admitting: Oncology

## 2021-08-15 ENCOUNTER — Other Ambulatory Visit (HOSPITAL_COMMUNITY): Payer: Self-pay

## 2021-08-15 MED ORDER — FREESTYLE LITE TEST VI STRP
ORAL_STRIP | 0 refills | Status: DC
  Filled 2021-08-15: qty 100, 90d supply, fill #0

## 2021-08-15 MED ORDER — FREESTYLE LANCETS MISC
4 refills | Status: AC
  Filled 2021-08-15: qty 100, 50d supply, fill #0

## 2021-08-19 ENCOUNTER — Other Ambulatory Visit (HOSPITAL_COMMUNITY): Payer: Self-pay

## 2021-08-19 MED ORDER — LEVEMIR FLEXTOUCH 100 UNIT/ML ~~LOC~~ SOPN
PEN_INJECTOR | SUBCUTANEOUS | 2 refills | Status: DC
  Filled 2021-08-19: qty 9, 50d supply, fill #0

## 2021-08-21 ENCOUNTER — Other Ambulatory Visit: Payer: Self-pay

## 2021-08-22 ENCOUNTER — Other Ambulatory Visit (HOSPITAL_COMMUNITY): Payer: Self-pay

## 2021-08-24 ENCOUNTER — Other Ambulatory Visit: Payer: Self-pay | Admitting: *Deleted

## 2021-08-24 NOTE — Patient Outreach (Signed)
[place] [facility]) Care Management  08/24/2021  JEFFERIE HOLSTON 11-23-1955 [id number]   Outgoing call placed to member to follow up on DM management, no answer, HIPAA compliant voice message left.  Will follow up within the next 3-4 business days.  Valente David, RN, MSN, [place] Manager [phone]

## 2021-08-28 ENCOUNTER — Other Ambulatory Visit (HOSPITAL_COMMUNITY): Payer: Self-pay

## 2021-08-30 ENCOUNTER — Other Ambulatory Visit: Payer: Self-pay | Admitting: *Deleted

## 2021-08-30 ENCOUNTER — Other Ambulatory Visit (HOSPITAL_COMMUNITY): Payer: Self-pay

## 2021-08-30 MED ORDER — EZETIMIBE-SIMVASTATIN 10-20 MG PO TABS
ORAL_TABLET | ORAL | 0 refills | Status: DC
  Filled 2021-08-30: qty 90, 90d supply, fill #0

## 2021-08-30 NOTE — Patient Outreach (Signed)
[place] [place] [facility]) Care Management  08/30/2021  [name] 1956/07/22 [id number]   Outreach attempt #2, unsuccessful, HIPAA compliant voice message left.  Will send outreach letter and follow up within the next 2-3 business days.  Valente David, RN, MSN, [place] Manager [phone]

## 2021-09-01 ENCOUNTER — Other Ambulatory Visit: Payer: Self-pay | Admitting: *Deleted

## 2021-09-01 NOTE — Patient Outreach (Signed)
[facility] [facility]) Care Management  09/01/2021  [name] 06/28/1956 [id number]   Outreach attempt #3, unsuccessful, HIPAA compliant voice message left.  Will follow up within the next 4 weeks. Will make 4th and final attempt within the next 4 weeks, if remain unsuccessful will close case due to inability to maintain contact.   Valente David, RN, MSN, [place] Manager [phone]

## 2021-09-20 ENCOUNTER — Inpatient Hospital Stay: Payer: 59

## 2021-09-20 ENCOUNTER — Inpatient Hospital Stay: Payer: 59 | Attending: Oncology

## 2021-09-20 ENCOUNTER — Other Ambulatory Visit: Payer: Self-pay

## 2021-09-20 ENCOUNTER — Inpatient Hospital Stay: Payer: 59 | Admitting: Dietician

## 2021-09-20 ENCOUNTER — Inpatient Hospital Stay: Payer: 59 | Admitting: Oncology

## 2021-09-20 VITALS — BP 118/74 | HR 87 | Temp 97.5°F | Resp 19 | Ht 75.0 in | Wt 185.6 lb

## 2021-09-20 DIAGNOSIS — C641 Malignant neoplasm of right kidney, except renal pelvis: Secondary | ICD-10-CM | POA: Insufficient documentation

## 2021-09-20 DIAGNOSIS — Z79899 Other long term (current) drug therapy: Secondary | ICD-10-CM | POA: Insufficient documentation

## 2021-09-20 DIAGNOSIS — Z5112 Encounter for antineoplastic immunotherapy: Secondary | ICD-10-CM | POA: Insufficient documentation

## 2021-09-20 LAB — CMP (CANCER CENTER ONLY)
ALT: 23 U/L (ref 0–44)
AST: 19 U/L (ref 15–41)
Albumin: 4.1 g/dL (ref 3.5–5.0)
Alkaline Phosphatase: 92 U/L (ref 38–126)
Anion gap: 9 (ref 5–15)
BUN: 26 mg/dL — ABNORMAL HIGH (ref 8–23)
CO2: 25 mmol/L (ref 22–32)
Calcium: 9.4 mg/dL (ref 8.9–10.3)
Chloride: 102 mmol/L (ref 98–111)
Creatinine: 1.7 mg/dL — ABNORMAL HIGH (ref 0.61–1.24)
GFR, Estimated: 43 mL/min — ABNORMAL LOW (ref >60)
Glucose, Bld: 303 mg/dL — ABNORMAL HIGH (ref 70–99)
Potassium: 4.5 mmol/L (ref 3.5–5.1)
Sodium: 136 mmol/L (ref 135–145)
Total Bilirubin: 0.6 mg/dL (ref 0.3–1.2)
Total Protein: 7 g/dL (ref 6.5–8.1)

## 2021-09-20 LAB — CBC WITH DIFFERENTIAL (CANCER CENTER ONLY)
Abs Immature Granulocytes: 0.01 10*3/uL (ref 0.00–0.07)
Basophils Absolute: 0 10*3/uL (ref 0.0–0.1)
Basophils Relative: 1 %
Eosinophils Absolute: 0.2 10*3/uL (ref 0.0–0.5)
Eosinophils Relative: 5 %
HCT: 38.3 % — ABNORMAL LOW (ref 39.0–52.0)
Hemoglobin: 12.5 g/dL — ABNORMAL LOW (ref 13.0–17.0)
Immature Granulocytes: 0 %
Lymphocytes Relative: 20 %
Lymphs Abs: 0.9 10*3/uL (ref 0.7–4.0)
MCH: 28.5 pg (ref 26.0–34.0)
MCHC: 32.6 g/dL (ref 30.0–36.0)
MCV: 87.4 fL (ref 80.0–100.0)
Monocytes Absolute: 0.4 10*3/uL (ref 0.1–1.0)
Monocytes Relative: 9 %
Neutro Abs: 2.8 10*3/uL (ref 1.7–7.7)
Neutrophils Relative %: 65 %
Platelet Count: 229 10*3/uL (ref 150–400)
RBC: 4.38 MIL/uL (ref 4.22–5.81)
RDW: 13 % (ref 11.5–15.5)
WBC Count: 4.2 10*3/uL (ref 4.0–10.5)
nRBC: 0 % (ref 0.0–0.2)

## 2021-09-20 LAB — TSH: TSH: 2.266 u[IU]/mL (ref 0.320–4.118)

## 2021-09-20 MED ORDER — SODIUM CHLORIDE 0.9 % IV SOLN
400.0000 mg | Freq: Once | INTRAVENOUS | Status: AC
  Administered 2021-09-20: 400 mg via INTRAVENOUS
  Filled 2021-09-20: qty 16

## 2021-09-20 MED ORDER — SODIUM CHLORIDE 0.9 % IV SOLN: Freq: Once | INTRAVENOUS | Status: AC

## 2021-09-20 NOTE — Progress Notes (Signed)
Labs reviewed by Dr. Alen Blew and [facility] to treat with creat of 1.7 and glucose of 303

## 2021-09-20 NOTE — Patient Instructions (Signed)
[facility] MEDICAL [facility]COLOGY  Discharge Instructions: ?Thank you for choosing [facility] to provide your oncology and hematology care.  ? ?If you have a lab appointment with the Cancer Center, please go directly to the Cancer Center and check in at the registration area. ?  ?Wear comfortable clothing and clothing appropriate for easy access to any Portacath or PICC line.  ? ?We strive to give you quality time with your provider. You may need to reschedule your appointment if you arrive late (15 or more minutes).  Arriving late affects you and other patients whose appointments are after yours.  Also, if you miss three or more appointments without notifying the office, you may be dismissed from the clinic at the provider?s discretion.    ?  ?For prescription refill requests, have your pharmacy contact our office and allow 72 hours for refills to be completed.   ? ?Today you received the following chemotherapy and/or immunotherapy agents: Keytruda ?  ?To help prevent nausea and vomiting after your treatment, we encourage you to take your nausea medication as directed. ? ?BELOW ARE SYMPTOMS THAT SHOULD BE REPORTED IMMEDIATELY: ?*FEVER GREATER THAN 100.4 F (38 ?C) OR HIGHER ?*CHILLS OR SWEATING ?*NAUSEA AND VOMITING THAT IS NOT CONTROLLED WITH YOUR NAUSEA MEDICATION ?*UNUSUAL SHORTNESS OF BREATH ?*UNUSUAL BRUISING OR BLEEDING ?*URINARY PROBLEMS (pain or burning when urinating, or frequent urination) ?*BOWEL PROBLEMS (unusual diarrhea, constipation, pain near the anus) ?TENDERNESS IN MOUTH AND THROAT WITH OR WITHOUT PRESENCE OF ULCERS (sore throat, sores in mouth, or a toothache) ?UNUSUAL RASH, SWELLING OR PAIN  ?UNUSUAL VAGINAL DISCHARGE OR ITCHING  ? ?Items with * indicate a potential emergency and should be followed up as soon as possible or go to the Emergency Department if any problems should occur. ? ?Please show the CHEMOTHERAPY ALERT CARD or IMMUNOTHERAPY ALERT CARD at check-in to the  Emergency Department and triage nurse. ? ?Should you have questions after your visit or need to cancel or reschedule your appointment, please contact [facility]  Dept: [phone]  and follow the prompts.  Office hours are 8:00 a.m. to 4:30 p.m. Monday - Friday. Please note that voicemails left after 4:00 p.m. may not be returned until the following business day.  We are closed weekends and major holidays. You have access to a nurse at all times for urgent questions. Please call the main number to the clinic Dept: [phone] and follow the prompts. ? ? ?For any non-urgent questions, you may also contact your provider using MyChart. We now offer e-Visits for anyone 18 and older to request care online for non-urgent symptoms. For details visit [email or web address]. ?  ?Also download the MyChart app! Go to the app store, search "MyChart", open the app, select [place], and log in with your MyChart username and password. ? ?Due to Covid, a mask is required upon entering the hospital/clinic. If you do not have a mask, one will be given to you upon arrival. For doctor visits, patients may have 1 support person aged 18 or older with them. For treatment visits, patients cannot have anyone with them due to current Covid guidelines and our immunocompromised population.  ? ?

## 2021-09-20 NOTE — Progress Notes (Signed)
Nutrition Assessment   Reason for Assessment: MST (+wt loss)   ASSESSMENT: 66 year old male with kidney cancer. He is s/p radical nephrectomy 08/05/20. Patient currently receiving immunotherapy with Keytruda q6 weeks. He is receiving final treatment today. Patient is followed by Dr. Alen Blew.  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 -Noted recent hospital admission 11/30-12/2 with DKA and influenza.   Met with patient during infusion. He reports good appetite and eating well. Patient reports losing ~15 lbs in 4 days secondary to flu and DKA. Patient has goal of gradual weight increase to usual weight of 195 lbs. 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 Patient reports walking daily. Patient is followed closely by PCP for IDDM2. He reports metformin and Jardiance were recently discontinued. Patient continues to take 22 units of Levemir in the morning. Patient reports he is planning to contact PCP this afternoon to discuss AM fasting blood sugars in 300's. Patient reports it does not matter what he tries, his levels are not decreasing. Patient recalls 3 balanced meals and bedtime snack of peanut butter on crackers or on "yankee donut" which is a mini bagel. Patient denies nutrition impact symptoms.   Nutrition Focused Physical Exam: deferred   Medications: Levemir, xarelto, MVI   Labs: Glucose 303 Last HgbA1c 12.2 (08/03/21)  Anthropometrics: Patient is currently 5% (10 lbs) under usual weight; insignificant   Height: 6'3" Weight: 185 lb 6.5 oz  UBW: 194-197 lb (March-September 2022 per chart) BMI: 23.2   NUTRITION DIAGNOSIS: Food and nutrition related knowledge deficit related to chronic illness (IDDM2) as evidenced by A1c of 12.2    INTERVENTION:  Educated on strategies to improve blood glucose - handout provided Discussed eating balanced  meals/snacks around same times daily as able - provided ideas Discussed good sources of lean proteins to include with all meals/snacks Suggested use of CGM to improve blood glucose levels - pt to discuss this with PCP Patient provided contact information    MONITORING, EVALUATION, GOAL: Patient will lower blood glucose levels by utilizing dietary recommendations and increased activity improving HgbA1c in 3 months.   Next Visit: No follow up scheduled at this time. Patient has contact information and encouraged to contact with nutrition questions/concerns.

## 2021-09-20 NOTE — Progress Notes (Signed)
Hematology and Oncology Follow Up Visit  [name] [id number] 10-Mar-1956 66 y.o. 09/20/2021 8:12 AM [name], MDRoss, [name], MD   Principle Diagnosis: 66 year old man with kidney cancer diagnosed in December 2021.  He was found to have T3aN0 clear-cell with rhabdoid features and invasion into perinephric fat diagnosed.    Prior Therapy:  He is status post laparoscopic radical nephrectomy and retroperitoneal lymph node dissection done on August 05, 2020.  He was found to have T3a N0  Current therapy: Pembrolizumab 200 mg every 3 weeks for adjuvant purposes started on October 12, 2020.  He is currently on 400 mg every 6 weeks to complete 1 year of therapy.  He is here for the last cycle of therapy.  Interim History: [name] returns today for a follow-up evaluation.  Since the last visit, he was hospitalized in December 2022 due to ketoacidosis and flu.  He has recovered from this illness and ready to proceed with his subsequent cycle of treatment.  He denies any fevers, chills or sweats.  He denies any nausea or fatigue.  He has tolerated Pembrolizumab without any major complaints.  His performance status and quality of life remain excellent.     Medications: Updated on review. Current Outpatient Medications  Medication Sig Dispense Refill   Dulaglutide (TRULICITY) 1.5 JI/9.6VE SOPN Inject 1 pen under the skin once a week 6 mL 4   ezetimibe-simvastatin (VYTORIN) 10-20 MG tablet Take 1 tablet by mouth once daily at night. 90 tablet 0   glucose blood (FREESTYLE LITE) test strip Use to test your blood sugar twice a day. 100 each 0   insulin detemir (LEVEMIR FLEXTOUCH) 100 UNIT/ML FlexPen Inject 22 Units into the skin daily. 9 mL 4   insulin detemir (LEVEMIR FLEXTOUCH) 100 UNIT/ML FlexPen Inject 18 units into the skin once a day 9 mL 2   Insulin Pen Needle (COMFORT EZ PEN NEEDLES) 32G X 5 MM MISC Inject as directed once daily 100 each 1   Lancets (FREESTYLE) lancets Use  to check blood sugar twice a day. 100 each 4   levothyroxine (SYNTHROID) 75 MCG tablet Take 1 tablet by mouth every morning on an empty stomach 90 tablet 4   losartan (COZAAR) 25 MG tablet Take 1/2 tablet by mouth at bedtime. 45 tablet 3   metFORMIN (GLUCOPHAGE-XR) 500 MG 24 hr tablet Take 2 tablets by mouth with food Twice a day (Patient not taking: Reported on 08/08/2021) 360 tablet 4   NOVOTWIST PEN NEEDLE 32G X 5 MM MISC INJECT AS DIRECTED ONCE DAILY 100 each 0   rivaroxaban (XARELTO) 10 MG TABS tablet Take 1 tablet (10 mg total) by mouth daily with food 90 tablet 4   No current facility-administered medications for this visit.   Facility-Administered Medications Ordered in Other Visits  Medication Dose Route Frequency Provider Last Rate Last Admin   magnesium citrate solution 0.5 Bottle  0.5 Bottle Oral Once Raynelle Bring, MD         Allergies: No Known Allergies    Physical Exam:   Blood pressure 118/74, pulse 87, temperature (!) 97.5 F (36.4 C), temperature source Temporal, resp. rate 19, height 6\' 3"  (1.905 m), weight 185 lb 9.6 oz (84.2 kg), SpO2 98 %.    ECOG:  0    General appearance: Comfortable appearing without any discomfort Head: Normocephalic without any trauma Oropharynx: Mucous membranes are moist and pink without any thrush or ulcers. Eyes: Pupils are equal and round reactive to light.  Lymph nodes: No cervical, supraclavicular, inguinal or axillary lymphadenopathy.   Heart:regular rate and rhythm.  S1 and S2 without leg edema. Lung: Clear without any rhonchi or wheezes.  No dullness to percussion. Abdomin: Soft, nontender, nondistended with good bowel sounds.  No hepatosplenomegaly. Musculoskeletal: No joint deformity or effusion.  Full range of motion noted. Neurological: No deficits noted on motor, sensory and deep tendon reflex exam. Skin: No petechial rash or dryness.  Appeared moist.           Lab Results: Lab Results  Component Value  Date   WBC 13.0 (H) 08/03/2021   HGB 15.3 08/03/2021   HCT 45.0 08/03/2021   MCV 88.0 08/03/2021   PLT 226 08/03/2021     Chemistry      Component Value Date/Time   NA 134 (L) 08/05/2021 0233   NA 136 11/18/2012 1331   K 3.6 08/05/2021 0233   K 4.7 11/18/2012 1331   CL 104 08/05/2021 0233   CL 98 11/18/2012 1331   CO2 21 (L) 08/05/2021 0233   CO2 27 11/18/2012 1331   BUN 31 (H) 08/05/2021 0233   BUN 24.6 11/18/2012 1331   CREATININE 1.52 (H) 08/05/2021 0233   CREATININE 2.01 (H) 06/28/2021 0954   CREATININE 1.7 (H) 11/18/2012 1331      Component Value Date/Time   CALCIUM 8.3 (L) 08/05/2021 0233   CALCIUM 9.9 11/18/2012 1331   ALKPHOS 71 08/03/2021 1112   ALKPHOS 117 11/18/2012 1331   AST 19 08/03/2021 1112   AST 11 (L) 06/28/2021 0954   AST 15 11/18/2012 1331   ALT 20 08/03/2021 1112   ALT 13 06/28/2021 0954   ALT 23 11/18/2012 1331   BILITOT 0.7 08/03/2021 1112   BILITOT 0.8 06/28/2021 0954   BILITOT 0.59 11/18/2012 1331         Impression and Plan:   66 year old with:   1.  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  He is completing adjuvant immunotherapy at this time without any major complications.  Risks and benefits of proceeding with the last treatment were discussed at this time.  Immune mediated issues among GI toxicity as well as others were reviewed.  After discussion today he is agreeable to proceed.  He is scheduled to have a repeat imaging studies in the near future.  Active surveillance at this time is recommended with additional therapy will be deferred if he has metastatic disease in the future.  2.  IV access: He will continue to use peripheral veins without any issues.   3.  Antiemetics: No nausea or vomiting reported at this time.  Compazine is available to him.   4.  Immune mediated complications: We continue to educate him about potential autoimmune issues at this time.  Pneumonitis, colitis  and thyroid disease.  I do not think his ketoacidosis is related to immunotherapy.   5.  Follow-up: He will return in 6 months for repeat follow-up.   30  minutes were spent on this visit.  The time was dedicated to reviewing laboratory data, disease status update and outlining future plan of care.        Zola Button, MD 1/17/20238:12 AM

## 2021-09-21 ENCOUNTER — Other Ambulatory Visit (HOSPITAL_COMMUNITY): Payer: Self-pay

## 2021-09-21 MED ORDER — LEVEMIR FLEXTOUCH 100 UNIT/ML ~~LOC~~ SOPN
PEN_INJECTOR | SUBCUTANEOUS | 0 refills | Status: DC
  Filled 2021-09-21: qty 15, 37d supply, fill #0

## 2021-09-21 MED ORDER — COMFORT EZ PEN NEEDLES 32G X 5 MM MISC
1 refills | Status: DC
  Filled 2021-09-21 (×2): qty 100, 90d supply, fill #0
  Filled 2021-12-08: qty 100, 90d supply, fill #1

## 2021-09-22 ENCOUNTER — Other Ambulatory Visit: Payer: Self-pay | Admitting: *Deleted

## 2021-09-22 ENCOUNTER — Other Ambulatory Visit (HOSPITAL_COMMUNITY): Payer: Self-pay

## 2021-09-22 MED ORDER — INSULIN PEN NEEDLE 32G X 5 MM MISC
3 refills | Status: DC
  Filled 2021-09-22: qty 100, 90d supply, fill #0

## 2021-09-22 NOTE — Patient Outreach (Signed)
[facility]) Care Management  09/22/2021  [name] 26-Mar-1956 [id number]   Outreach attempt #4 unsuccessful, HIPAA compliant voice message left.  No response from member after multiple unsuccessful outreach attempts and letter sent.  Will close case at this time due to inability to maintain contact.   Valente David, RN, MSN, [place] Manager [phone]

## 2021-09-23 ENCOUNTER — Other Ambulatory Visit (HOSPITAL_COMMUNITY): Payer: Self-pay

## 2021-09-23 ENCOUNTER — Ambulatory Visit: Payer: Self-pay | Admitting: *Deleted

## 2021-09-23 DIAGNOSIS — C641 Malignant neoplasm of right kidney, except renal pelvis: Secondary | ICD-10-CM | POA: Diagnosis not present

## 2021-09-30 DIAGNOSIS — C641 Malignant neoplasm of right kidney, except renal pelvis: Secondary | ICD-10-CM | POA: Diagnosis not present

## 2021-10-06 NOTE — Progress Notes (Signed)
..  Patient Assist/Replace for the following has been terminated. Medication: Beryle Flock  Reason for Termination: Patient completed treatment on 09/20/2021, follow-up 6 months Last DOS: 09/20/2021.  Marland KitchenJuan Quam, CPhT IV Drug Replacement Specialist [place] Phone: [phone]

## 2021-10-07 DIAGNOSIS — R918 Other nonspecific abnormal finding of lung field: Secondary | ICD-10-CM | POA: Diagnosis not present

## 2021-10-07 DIAGNOSIS — Z85528 Personal history of other malignant neoplasm of kidney: Secondary | ICD-10-CM | POA: Diagnosis not present

## 2021-10-07 DIAGNOSIS — I700 Atherosclerosis of aorta: Secondary | ICD-10-CM | POA: Diagnosis not present

## 2021-10-07 DIAGNOSIS — C641 Malignant neoplasm of right kidney, except renal pelvis: Secondary | ICD-10-CM | POA: Diagnosis not present

## 2021-10-07 DIAGNOSIS — N2889 Other specified disorders of kidney and ureter: Secondary | ICD-10-CM | POA: Diagnosis not present

## 2021-10-07 DIAGNOSIS — K760 Fatty (change of) liver, not elsewhere classified: Secondary | ICD-10-CM | POA: Diagnosis not present

## 2021-10-07 DIAGNOSIS — I2510 Atherosclerotic heart disease of native coronary artery without angina pectoris: Secondary | ICD-10-CM | POA: Diagnosis not present

## 2021-10-21 ENCOUNTER — Other Ambulatory Visit (HOSPITAL_COMMUNITY): Payer: Self-pay

## 2021-10-31 ENCOUNTER — Other Ambulatory Visit (HOSPITAL_COMMUNITY): Payer: Self-pay

## 2021-11-01 ENCOUNTER — Other Ambulatory Visit (HOSPITAL_COMMUNITY): Payer: Self-pay

## 2021-11-03 ENCOUNTER — Other Ambulatory Visit (HOSPITAL_COMMUNITY): Payer: Self-pay

## 2021-11-04 ENCOUNTER — Other Ambulatory Visit (HOSPITAL_COMMUNITY): Payer: Self-pay

## 2021-11-04 MED ORDER — LEVEMIR FLEXTOUCH 100 UNIT/ML ~~LOC~~ SOPN
PEN_INJECTOR | SUBCUTANEOUS | 1 refills | Status: DC
  Filled 2021-11-04: qty 15, 38d supply, fill #0
  Filled 2021-12-08: qty 15, 38d supply, fill #1

## 2021-11-09 ENCOUNTER — Other Ambulatory Visit (HOSPITAL_COMMUNITY): Payer: Self-pay

## 2021-11-09 DIAGNOSIS — R21 Rash and other nonspecific skin eruption: Secondary | ICD-10-CM | POA: Diagnosis not present

## 2021-11-09 DIAGNOSIS — E1169 Type 2 diabetes mellitus with other specified complication: Secondary | ICD-10-CM | POA: Diagnosis not present

## 2021-11-09 DIAGNOSIS — N183 Chronic kidney disease, stage 3 unspecified: Secondary | ICD-10-CM | POA: Diagnosis not present

## 2021-11-09 DIAGNOSIS — I10 Essential (primary) hypertension: Secondary | ICD-10-CM | POA: Diagnosis not present

## 2021-11-09 MED ORDER — CLOTRIMAZOLE-BETAMETHASONE 1-0.05 % EX CREA
TOPICAL_CREAM | CUTANEOUS | 3 refills | Status: DC
  Filled 2021-11-09: qty 15, 30d supply, fill #0
  Filled 2021-12-16: qty 15, 7d supply, fill #1
  Filled 2021-12-28: qty 15, 30d supply, fill #2
  Filled 2021-12-30: qty 15, 7d supply, fill #2

## 2021-11-14 ENCOUNTER — Other Ambulatory Visit (HOSPITAL_COMMUNITY): Payer: Self-pay

## 2021-11-14 DIAGNOSIS — I129 Hypertensive chronic kidney disease with stage 1 through stage 4 chronic kidney disease, or unspecified chronic kidney disease: Secondary | ICD-10-CM | POA: Diagnosis not present

## 2021-11-14 DIAGNOSIS — N2581 Secondary hyperparathyroidism of renal origin: Secondary | ICD-10-CM | POA: Diagnosis not present

## 2021-11-14 DIAGNOSIS — Z85528 Personal history of other malignant neoplasm of kidney: Secondary | ICD-10-CM | POA: Diagnosis not present

## 2021-11-14 DIAGNOSIS — D631 Anemia in chronic kidney disease: Secondary | ICD-10-CM | POA: Diagnosis not present

## 2021-11-14 DIAGNOSIS — R809 Proteinuria, unspecified: Secondary | ICD-10-CM | POA: Diagnosis not present

## 2021-11-14 DIAGNOSIS — N183 Chronic kidney disease, stage 3 unspecified: Secondary | ICD-10-CM | POA: Diagnosis not present

## 2021-11-14 MED ORDER — LOSARTAN POTASSIUM 25 MG PO TABS
25.0000 mg | ORAL_TABLET | Freq: Every evening | ORAL | 2 refills | Status: DC
  Filled 2021-11-14 – 2021-12-13 (×2): qty 30, 30d supply, fill #0
  Filled 2022-01-12: qty 30, 30d supply, fill #1

## 2021-11-21 ENCOUNTER — Other Ambulatory Visit (HOSPITAL_COMMUNITY): Payer: Self-pay

## 2021-11-27 ENCOUNTER — Other Ambulatory Visit (HOSPITAL_COMMUNITY): Payer: Self-pay

## 2021-11-28 ENCOUNTER — Other Ambulatory Visit (HOSPITAL_COMMUNITY): Payer: Self-pay

## 2021-11-28 MED ORDER — EZETIMIBE-SIMVASTATIN 10-20 MG PO TABS
ORAL_TABLET | ORAL | 1 refills | Status: DC
  Filled 2021-11-28: qty 90, 90d supply, fill #0

## 2021-12-09 ENCOUNTER — Other Ambulatory Visit (HOSPITAL_COMMUNITY): Payer: Self-pay

## 2021-12-10 ENCOUNTER — Other Ambulatory Visit (HOSPITAL_COMMUNITY): Payer: Self-pay

## 2021-12-12 ENCOUNTER — Other Ambulatory Visit (HOSPITAL_COMMUNITY): Payer: Self-pay

## 2021-12-13 ENCOUNTER — Other Ambulatory Visit (HOSPITAL_COMMUNITY): Payer: Self-pay

## 2021-12-17 ENCOUNTER — Other Ambulatory Visit (HOSPITAL_COMMUNITY): Payer: Self-pay

## 2021-12-28 ENCOUNTER — Other Ambulatory Visit (HOSPITAL_COMMUNITY): Payer: Self-pay

## 2021-12-30 ENCOUNTER — Other Ambulatory Visit (HOSPITAL_COMMUNITY): Payer: Self-pay

## 2022-01-12 ENCOUNTER — Other Ambulatory Visit (HOSPITAL_COMMUNITY): Payer: Self-pay

## 2022-01-13 ENCOUNTER — Other Ambulatory Visit (HOSPITAL_COMMUNITY): Payer: Self-pay

## 2022-01-14 ENCOUNTER — Other Ambulatory Visit (HOSPITAL_COMMUNITY): Payer: Self-pay

## 2022-01-16 ENCOUNTER — Other Ambulatory Visit (HOSPITAL_COMMUNITY): Payer: Self-pay

## 2022-01-17 ENCOUNTER — Other Ambulatory Visit (HOSPITAL_COMMUNITY): Payer: Self-pay

## 2022-01-17 MED ORDER — INSULIN DETEMIR 100 UNIT/ML FLEXPEN
PEN_INJECTOR | SUBCUTANEOUS | 2 refills | Status: DC
  Filled 2022-01-17: qty 15, 37d supply, fill #0
  Filled 2022-03-24: qty 15, 37d supply, fill #1
  Filled 2022-04-25 (×2): qty 15, 37d supply, fill #2

## 2022-01-23 ENCOUNTER — Other Ambulatory Visit (HOSPITAL_COMMUNITY): Payer: Self-pay

## 2022-01-26 ENCOUNTER — Other Ambulatory Visit (HOSPITAL_COMMUNITY): Payer: Self-pay

## 2022-01-27 ENCOUNTER — Other Ambulatory Visit (HOSPITAL_COMMUNITY): Payer: Self-pay

## 2022-01-27 MED ORDER — INSULIN PEN NEEDLE 32G X 4 MM MISC
3 refills | Status: DC
  Filled 2022-01-27 – 2022-01-31 (×2): qty 100, 100d supply, fill #0
  Filled 2022-07-14: qty 100, 90d supply, fill #0
  Filled 2022-08-30 – 2022-09-02 (×3): qty 100, 90d supply, fill #1

## 2022-01-31 ENCOUNTER — Other Ambulatory Visit (HOSPITAL_COMMUNITY): Payer: Self-pay

## 2022-02-01 ENCOUNTER — Encounter: Payer: Self-pay | Admitting: Oncology

## 2022-02-01 ENCOUNTER — Other Ambulatory Visit (HOSPITAL_COMMUNITY): Payer: Self-pay

## 2022-02-01 MED ORDER — UNIFINE PENTIPS 32G X 4 MM MISC
3 refills | Status: DC
  Filled 2022-02-01: qty 100, 90d supply, fill #0
  Filled 2022-02-01: qty 100, 50d supply, fill #0

## 2022-02-02 DIAGNOSIS — E032 Hypothyroidism due to medicaments and other exogenous substances: Secondary | ICD-10-CM | POA: Diagnosis not present

## 2022-02-02 DIAGNOSIS — E7800 Pure hypercholesterolemia, unspecified: Secondary | ICD-10-CM | POA: Diagnosis not present

## 2022-02-02 DIAGNOSIS — Z125 Encounter for screening for malignant neoplasm of prostate: Secondary | ICD-10-CM | POA: Diagnosis not present

## 2022-02-02 DIAGNOSIS — E1169 Type 2 diabetes mellitus with other specified complication: Secondary | ICD-10-CM | POA: Diagnosis not present

## 2022-02-07 ENCOUNTER — Other Ambulatory Visit: Payer: Self-pay | Admitting: Nurse Practitioner

## 2022-02-08 ENCOUNTER — Other Ambulatory Visit (HOSPITAL_COMMUNITY): Payer: Self-pay

## 2022-02-08 DIAGNOSIS — I10 Essential (primary) hypertension: Secondary | ICD-10-CM | POA: Diagnosis not present

## 2022-02-08 DIAGNOSIS — Z794 Long term (current) use of insulin: Secondary | ICD-10-CM | POA: Diagnosis not present

## 2022-02-08 DIAGNOSIS — Z85810 Personal history of malignant neoplasm of tongue: Secondary | ICD-10-CM | POA: Diagnosis not present

## 2022-02-08 DIAGNOSIS — Z86711 Personal history of pulmonary embolism: Secondary | ICD-10-CM | POA: Diagnosis not present

## 2022-02-08 DIAGNOSIS — E1169 Type 2 diabetes mellitus with other specified complication: Secondary | ICD-10-CM | POA: Diagnosis not present

## 2022-02-08 DIAGNOSIS — E7800 Pure hypercholesterolemia, unspecified: Secondary | ICD-10-CM | POA: Diagnosis not present

## 2022-02-08 DIAGNOSIS — Z0000 Encounter for general adult medical examination without abnormal findings: Secondary | ICD-10-CM | POA: Diagnosis not present

## 2022-02-08 DIAGNOSIS — E032 Hypothyroidism due to medicaments and other exogenous substances: Secondary | ICD-10-CM | POA: Diagnosis not present

## 2022-02-08 DIAGNOSIS — D473 Essential (hemorrhagic) thrombocythemia: Secondary | ICD-10-CM | POA: Diagnosis not present

## 2022-02-08 MED ORDER — INSULIN DETEMIR 100 UNIT/ML FLEXPEN
PEN_INJECTOR | SUBCUTANEOUS | 4 refills | Status: DC
  Filled 2022-02-08: qty 15, 34d supply, fill #0
  Filled 2022-04-25 (×2): qty 15, 34d supply, fill #1
  Filled 2022-05-30: qty 15, 34d supply, fill #2
  Filled 2022-07-14: qty 30, 68d supply, fill #3

## 2022-02-08 MED ORDER — EZETIMIBE-SIMVASTATIN 10-20 MG PO TABS
ORAL_TABLET | ORAL | 4 refills | Status: AC
  Filled 2022-02-08: qty 90, 90d supply, fill #0
  Filled 2022-06-04: qty 90, 90d supply, fill #1
  Filled 2022-08-30: qty 90, 90d supply, fill #2

## 2022-02-08 MED ORDER — LOSARTAN POTASSIUM 25 MG PO TABS
ORAL_TABLET | ORAL | 4 refills | Status: DC
  Filled 2022-02-08: qty 90, 90d supply, fill #0
  Filled 2022-05-14: qty 90, 90d supply, fill #1

## 2022-02-08 MED ORDER — TRULICITY 1.5 MG/0.5ML ~~LOC~~ SOAJ
SUBCUTANEOUS | 4 refills | Status: AC
  Filled 2022-02-08 – 2022-02-26 (×2): qty 6, 84d supply, fill #0
  Filled 2022-05-21: qty 6, 84d supply, fill #1
  Filled 2022-08-17: qty 6, 84d supply, fill #2

## 2022-02-08 MED ORDER — REPAGLINIDE 1 MG PO TABS
ORAL_TABLET | ORAL | 6 refills | Status: DC
  Filled 2022-02-08: qty 90, 45d supply, fill #0

## 2022-02-09 ENCOUNTER — Other Ambulatory Visit (HOSPITAL_COMMUNITY): Payer: Self-pay

## 2022-02-09 MED ORDER — PIP PEN NEEDLES 32G X 4MM 32G X 4 MM MISC
4.0000 mm | 3 refills | Status: DC
  Filled 2022-02-09 – 2022-03-21 (×2): qty 200, 90d supply, fill #0

## 2022-02-15 ENCOUNTER — Other Ambulatory Visit (HOSPITAL_COMMUNITY): Payer: Self-pay

## 2022-02-15 DIAGNOSIS — H524 Presbyopia: Secondary | ICD-10-CM | POA: Diagnosis not present

## 2022-02-15 DIAGNOSIS — H5203 Hypermetropia, bilateral: Secondary | ICD-10-CM | POA: Diagnosis not present

## 2022-02-15 DIAGNOSIS — H52223 Regular astigmatism, bilateral: Secondary | ICD-10-CM | POA: Diagnosis not present

## 2022-02-18 ENCOUNTER — Other Ambulatory Visit (HOSPITAL_COMMUNITY): Payer: Self-pay

## 2022-02-20 ENCOUNTER — Other Ambulatory Visit (HOSPITAL_COMMUNITY): Payer: Self-pay

## 2022-02-27 ENCOUNTER — Other Ambulatory Visit (HOSPITAL_COMMUNITY): Payer: Self-pay

## 2022-03-08 DIAGNOSIS — R1313 Dysphagia, pharyngeal phase: Secondary | ICD-10-CM | POA: Diagnosis not present

## 2022-03-08 DIAGNOSIS — J3801 Paralysis of vocal cords and larynx, unilateral: Secondary | ICD-10-CM | POA: Diagnosis not present

## 2022-03-08 DIAGNOSIS — R490 Dysphonia: Secondary | ICD-10-CM | POA: Diagnosis not present

## 2022-03-08 DIAGNOSIS — Z85810 Personal history of malignant neoplasm of tongue: Secondary | ICD-10-CM | POA: Diagnosis not present

## 2022-03-09 ENCOUNTER — Other Ambulatory Visit: Payer: Self-pay | Admitting: Otolaryngology

## 2022-03-09 DIAGNOSIS — R1313 Dysphagia, pharyngeal phase: Secondary | ICD-10-CM

## 2022-03-09 DIAGNOSIS — R490 Dysphonia: Secondary | ICD-10-CM

## 2022-03-09 DIAGNOSIS — J3801 Paralysis of vocal cords and larynx, unilateral: Secondary | ICD-10-CM

## 2022-03-15 ENCOUNTER — Ambulatory Visit
Admission: RE | Admit: 2022-03-15 | Discharge: 2022-03-15 | Disposition: A | Discharge status: Home / Self Care | Payer: 59 | Source: Ambulatory Visit | Attending: Otolaryngology | Admitting: Otolaryngology

## 2022-03-15 DIAGNOSIS — R1310 Dysphagia, unspecified: Secondary | ICD-10-CM | POA: Diagnosis not present

## 2022-03-15 DIAGNOSIS — R059 Cough, unspecified: Secondary | ICD-10-CM | POA: Diagnosis not present

## 2022-03-15 DIAGNOSIS — R1313 Dysphagia, pharyngeal phase: Secondary | ICD-10-CM

## 2022-03-16 ENCOUNTER — Other Ambulatory Visit (HOSPITAL_COMMUNITY): Payer: Self-pay

## 2022-03-21 ENCOUNTER — Other Ambulatory Visit (HOSPITAL_COMMUNITY): Payer: Self-pay

## 2022-03-23 ENCOUNTER — Ambulatory Visit
Admission: RE | Admit: 2022-03-23 | Discharge: 2022-03-23 | Disposition: A | Discharge status: Home / Self Care | Payer: 59 | Source: Ambulatory Visit | Attending: Otolaryngology | Admitting: Otolaryngology

## 2022-03-23 DIAGNOSIS — K110 Atrophy of salivary gland: Secondary | ICD-10-CM | POA: Diagnosis not present

## 2022-03-23 DIAGNOSIS — J3801 Paralysis of vocal cords and larynx, unilateral: Secondary | ICD-10-CM | POA: Diagnosis not present

## 2022-03-23 DIAGNOSIS — R490 Dysphonia: Secondary | ICD-10-CM

## 2022-03-23 DIAGNOSIS — R1310 Dysphagia, unspecified: Secondary | ICD-10-CM | POA: Diagnosis not present

## 2022-03-23 MED ORDER — IOPAMIDOL (ISOVUE-300) INJECTION 61%
60.0000 mL | Freq: Once | INTRAVENOUS | Status: AC | PRN
  Administered 2022-03-23: 60 mL via INTRAVENOUS

## 2022-03-24 ENCOUNTER — Other Ambulatory Visit (HOSPITAL_COMMUNITY): Payer: Self-pay

## 2022-04-05 ENCOUNTER — Inpatient Hospital Stay (HOSPITAL_BASED_OUTPATIENT_CLINIC_OR_DEPARTMENT_OTHER): Payer: 59 | Admitting: Oncology

## 2022-04-05 ENCOUNTER — Inpatient Hospital Stay: Payer: 59 | Attending: Oncology

## 2022-04-05 ENCOUNTER — Other Ambulatory Visit: Payer: Self-pay

## 2022-04-05 VITALS — BP 120/58 | HR 83 | Temp 98.1°F | Resp 15 | Wt 201.5 lb

## 2022-04-05 DIAGNOSIS — Z79899 Other long term (current) drug therapy: Secondary | ICD-10-CM | POA: Diagnosis not present

## 2022-04-05 DIAGNOSIS — C641 Malignant neoplasm of right kidney, except renal pelvis: Secondary | ICD-10-CM | POA: Diagnosis not present

## 2022-04-05 LAB — CMP (CANCER CENTER ONLY)
ALT: 28 U/L (ref 0–44)
AST: 20 U/L (ref 15–41)
Albumin: 4.3 g/dL (ref 3.5–5.0)
Alkaline Phosphatase: 88 U/L (ref 38–126)
Anion gap: 4 — ABNORMAL LOW (ref 5–15)
BUN: 45 mg/dL — ABNORMAL HIGH (ref 8–23)
CO2: 28 mmol/L (ref 22–32)
Calcium: 8.9 mg/dL (ref 8.9–10.3)
Chloride: 106 mmol/L (ref 98–111)
Creatinine: 2.33 mg/dL — ABNORMAL HIGH (ref 0.61–1.24)
GFR, Estimated: 30 mL/min — ABNORMAL LOW (ref >60)
Glucose, Bld: 214 mg/dL — ABNORMAL HIGH (ref 70–99)
Potassium: 5.4 mmol/L — ABNORMAL HIGH (ref 3.5–5.1)
Sodium: 138 mmol/L (ref 135–145)
Total Bilirubin: 0.6 mg/dL (ref 0.3–1.2)
Total Protein: 7 g/dL (ref 6.5–8.1)

## 2022-04-05 LAB — CBC WITH DIFFERENTIAL (CANCER CENTER ONLY)
Abs Immature Granulocytes: 0.01 10*3/uL (ref 0.00–0.07)
Basophils Absolute: 0 10*3/uL (ref 0.0–0.1)
Basophils Relative: 1 %
Eosinophils Absolute: 0.1 10*3/uL (ref 0.0–0.5)
Eosinophils Relative: 3 %
HCT: 37.1 % — ABNORMAL LOW (ref 39.0–52.0)
Hemoglobin: 12 g/dL — ABNORMAL LOW (ref 13.0–17.0)
Immature Granulocytes: 0 %
Lymphocytes Relative: 17 %
Lymphs Abs: 0.8 10*3/uL (ref 0.7–4.0)
MCH: 28.7 pg (ref 26.0–34.0)
MCHC: 32.3 g/dL (ref 30.0–36.0)
MCV: 88.8 fL (ref 80.0–100.0)
Monocytes Absolute: 0.4 10*3/uL (ref 0.1–1.0)
Monocytes Relative: 9 %
Neutro Abs: 3.2 10*3/uL (ref 1.7–7.7)
Neutrophils Relative %: 70 %
Platelet Count: 200 10*3/uL (ref 150–400)
RBC: 4.18 MIL/uL — ABNORMAL LOW (ref 4.22–5.81)
RDW: 12.7 % (ref 11.5–15.5)
WBC Count: 4.6 10*3/uL (ref 4.0–10.5)
nRBC: 0 % (ref 0.0–0.2)

## 2022-04-05 LAB — TSH: TSH: 1.957 u[IU]/mL (ref 0.350–4.500)

## 2022-04-05 NOTE — Progress Notes (Signed)
Hematology and Oncology Follow Up Visit  SHENOUDA GENOVA [id number] 08/09/1956 66 y.o. 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    Prior Therapy:  He is status post laparoscopic radical nephrectomy and retroperitoneal lymph node dissection done on August 05, 2020.  He was found to have T3a N0   Pembrolizumab 200 mg every 3 weeks for adjuvant purposes started on October 12, 2020.  He received 400 mg every 6 weeks subsequently to complete 1 year of therapy in January 2023.  Current therapy: Active surveillance.  Interim History: [name] is here for a follow-up.  Since the last visit, he reports feeling well without any major complaints.  He has experienced choking sensation and possible aspiration.  CT scan of the neck showed no evidence of acute pathology.  He is currently under evaluation with modified barium swallow study.  He denies any complications related to Pembrolizumab at this time.  He denies any abdominal pain or discomfort.     Medications: Updated on review. Current Outpatient Medications  Medication Sig Dispense Refill   clotrimazole-betamethasone (LOTRISONE) cream Apply to the affected area twice a day 15 g 3   Dulaglutide (TRULICITY) 1.5 JO/8.4ZY SOPN Inject 1 pen under the skin once a week 6 mL 4   ezetimibe-simvastatin (VYTORIN) 10-20 MG tablet Take 1 tablet by mouth once nightly. 90 tablet 1   ezetimibe-simvastatin (VYTORIN) 10-20 MG tablet Take 1 tablet by mouth once daily at night. 90 tablet 4   glucose blood (FREESTYLE LITE) test strip Use to test your blood sugar twice a day. 100 each 0   insulin detemir (LEVEMIR FLEXTOUCH) 100 UNIT/ML FlexPen Inject 22 Units into the skin daily. 9 mL 4   insulin detemir (LEVEMIR FLEXTOUCH) 100 UNIT/ML FlexPen Inject 18 units into  the skin once a day 9 mL 2   insulin detemir (LEVEMIR FLEXTOUCH) 100 UNIT/ML FlexPen Inject 32-40 units into the skin once a day 15 mL 1   insulin detemir (LEVEMIR) 100 UNIT/ML FlexPen Inject 32 - 40 units into the skin once daily as directed 15 mL 2   insulin detemir (LEVEMIR) 100 UNIT/ML FlexPen Inject 22 units into the skin 2 times a day 15 mL 4   Insulin Pen Needle (PIP PEN NEEDLES 32G X 4MM) 32G X 4 MM MISC Use twice daily. 200 each 3   Insulin Pen Needle (UNIFINE PENTIPS) 32G X 4 MM MISC Use as directed once a day with insulin 100 each 3   Insulin Pen Needle 32G X 4 MM MISC Use as directed once daily 100 each 3   Insulin Pen Needle 32G X 5 MM MISC Use as directed once a day 100 each 3   Lancets (FREESTYLE) lancets Use to check blood sugar twice a day. 100 each 4   levothyroxine (SYNTHROID) 75 MCG tablet Take 1 tablet by mouth every morning on an empty stomach 90 tablet 4   losartan (COZAAR) 25 MG tablet Take 1/2 tablet by mouth at bedtime. 45 tablet 3   losartan (COZAAR) 25 MG tablet Take 1 tablet (25 mg total) by mouth at bedtime. 30 tablet 2   losartan (COZAAR) 25 MG tablet Take 1 tablet by mouth once a day 90 tablet 4   metFORMIN (GLUCOPHAGE-XR) 500 MG 24 hr tablet Take 2 tablets by mouth with food Twice a day (Patient  not taking: Reported on 08/08/2021) 360 tablet 4   repaglinide (PRANDIN) 1 MG tablet Take 1/2 to 1 tablet by mouth 15 to 30 minutes before meals twice a day 90 tablet 6   rivaroxaban (XARELTO) 10 MG TABS tablet Take 1 tablet (10 mg total) by mouth daily with food 90 tablet 4   No current facility-administered medications for this visit.   Facility-Administered Medications Ordered in Other Visits  Medication Dose Route Frequency Provider Last Rate Last Admin   magnesium citrate solution 0.5 Bottle  0.5 Bottle Oral Once Raynelle Bring, MD         Allergies: No Known Allergies    Physical Exam:       ECOG:  0   General appearance: Alert, awake without any  distress. Head: Atraumatic without abnormalities Oropharynx: Without any thrush or ulcers. Eyes: No scleral icterus. Lymph nodes: No lymphadenopathy noted in the cervical, supraclavicular, or axillary nodes Heart:regular rate and rhythm, without any murmurs or gallops.   Lung: Clear to auscultation without any rhonchi, wheezes or dullness to percussion. Abdomin: Soft, nontender without any shifting dullness or ascites. Musculoskeletal: No clubbing or cyanosis. Neurological: No motor or sensory deficits. Skin: No rashes or lesions.           Lab Results: Lab Results  Component Value Date   WBC 4.2 09/20/2021   HGB 12.5 (L) 09/20/2021   HCT 38.3 (L) 09/20/2021   MCV 87.4 09/20/2021   PLT 229 09/20/2021     Chemistry      Component Value Date/Time   NA 136 09/20/2021 0755   NA 136 11/18/2012 1331   K 4.5 09/20/2021 0755   K 4.7 11/18/2012 1331   CL 102 09/20/2021 0755   CL 98 11/18/2012 1331   CO2 25 09/20/2021 0755   CO2 27 11/18/2012 1331   BUN 26 (H) 09/20/2021 0755   BUN 24.6 11/18/2012 1331   CREATININE 1.70 (H) 09/20/2021 0755   CREATININE 1.7 (H) 11/18/2012 1331      Component Value Date/Time   CALCIUM 9.4 09/20/2021 0755   CALCIUM 9.9 11/18/2012 1331   ALKPHOS 92 09/20/2021 0755   ALKPHOS 117 11/18/2012 1331   AST 19 09/20/2021 0755   AST 15 11/18/2012 1331   ALT 23 09/20/2021 0755   ALT 23 11/18/2012 1331   BILITOT 0.6 09/20/2021 0755   BILITOT 0.59 11/18/2012 1331         Impression and Plan:   66 year old with:   1.  Kidney cancer diagnosed in December 2021.  He was found to have T3a N0 clear-cell with rhabdoid features.   The natural course of this disease was reviewed at this time and treatment choices were discussed.  He is currently on active surveillance after completing 1 year of adjuvant immunotherapy.  CT scan obtained in February 2023 and will be repeated in September at the care of [facility].  At this time no additional  systemic therapy will be needed unless he has advanced disease in the future.     2.  Immune mediated complications: He has not experienced any at this time.  3.  Vocal cord dysfunction: Currently under evaluation with ENT.  CT scan did not show any acute pathology.   4.  Follow-up: Will be as needed in the future of any pathologies detected on future imaging studies.   30  minutes were dedicated to this visit.  The time was spent on reviewing laboratory data, disease status update and outlining future plan  of care discussion.        Zola Button, MD 8/2/20231:11 PM

## 2022-04-09 ENCOUNTER — Ambulatory Visit (HOSPITAL_COMMUNITY)
Admission: EM | Admit: 2022-04-09 | Discharge: 2022-04-09 | Disposition: A | Discharge status: Home / Self Care | Payer: 59 | Attending: Emergency Medicine | Admitting: Emergency Medicine

## 2022-04-09 ENCOUNTER — Other Ambulatory Visit: Payer: Self-pay

## 2022-04-09 ENCOUNTER — Encounter (HOSPITAL_COMMUNITY): Payer: Self-pay | Admitting: *Deleted

## 2022-04-09 DIAGNOSIS — J069 Acute upper respiratory infection, unspecified: Secondary | ICD-10-CM | POA: Insufficient documentation

## 2022-04-09 DIAGNOSIS — Z20822 Contact with and (suspected) exposure to covid-19: Secondary | ICD-10-CM | POA: Insufficient documentation

## 2022-04-09 LAB — CBG MONITORING, ED: Glucose-Capillary: 214 mg/dL — ABNORMAL HIGH (ref 70–99)

## 2022-04-09 NOTE — ED Triage Notes (Signed)
Pr Sx's started on Friday. Pt has had a fever and has been using Advil . Pt has had back pain and felt weak

## 2022-04-09 NOTE — Discharge Instructions (Addendum)
I recommend continuing symptomatic care at home.  We will call you if the COVID test returns positive.  Drink as much fluids as you will tolerate.  If you have any worsening symptoms, please go to the emergency department.

## 2022-04-09 NOTE — ED Provider Notes (Signed)
[facility]    CSN: [id number] Arrival date & time: 04/09/22  1625      History   Chief Complaint Chief Complaint  Patient presents with   Fever   Hyperglycemia   Back Pain    HPI [name] is a 66 y.o. male.  Presents with 2-day history of fever, body aches, back pain.  Overall fatigue. Highest fever was 103.  Fever this afternoon.  He has been taking Advil with relief of symptoms.  Reports the body aches have mostly resolved.  He denies any nasal congestion or cough.  No abdominal pain, vomiting/diarrhea.  No known sick contacts. No urinary symptoms.  He does have history of diabetes and reports he feels his sugar is high.  Last was checked this morning, number was 149.  Past Medical History:  Diagnosis Date   Alopecia    s/p chemotherapy   Arthritis    Bursitis    pt. 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,stg IV,HPV positive   Diabetes mellitus without complication ([place])    takes Amaryl,Januvia,and Metformin,daily   DVT, lower extremity ([place]) 2009   side effect from chemo and has blood clot left leg-wears compression stockings;was on Coumadin for 87months and has been off 5 yrs   History of chemotherapy    Cisplatin/Taxotere,5-FU   History of radiation therapy 01/28/08-03/11/08   left base tongue   History of shingles    Hyperlipidemia    takes Vytorin daily   Hypothyroidism    takes Synthroid daily   Joint pain    Joint swelling    Neuropathy    left foot   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 denies   Right kidney mass    Seborrheic keratosis    multiple on back    Patient Active Problem List   Diagnosis Date Noted   AKI (acute kidney injury) ([place]) 08/04/2021   History of DVT (deep vein thrombosis) 08/04/2021   History of pulmonary embolus (PE) 65/68/1275   Acute metabolic acidosis 17/00/1749   DKA (diabetic ketoacidosis) ([place]) 08/03/2021    Elevated serum creatinine 04/06/2021   Renal cell carcinoma, right ([place]) 08/05/2020   Thyroid activity decreased    Acute pulmonary embolism ([place]) 12/07/2014   Diabetes mellitus type 2 in nonobese ([place]) 12/07/2014   Cancer ([place])    History of angioplasty    Depression    DVT, lower extremity ([place])    Osteoradionecrosis of mandible    Malignant neoplasm of base of tongue ([place]) 07/20/2011   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/20/2011   Pulmonary embolism ([place]) 07/20/2011    Past Surgical History:  Procedure Laterality Date   CARDIAC CATHETERIZATION     feeding tube placed  2009 and then removed   [place] Right 08/05/2020   Procedure: LYMPH NODE DISSECTION;  Surgeon: Raynelle Bring, MD;  Location: [facility];  Service: Urology;  Laterality: Right;   MOUTH SURGERY     PILONIDAL CYST / SINUS EXCISION  1974   port a cath placement     PORT-A-CATH REMOVAL  2010   right wrist fracture Right 2004   ROBOT ASSISTED LAPAROSCOPIC NEPHRECTOMY Right 08/05/2020   Procedure: XI ROBOTIC ASSISTED LAPAROSCOPIC RADICAL NEPHRECTOMY/PARTIAL ADRENALECTOMY;  Surgeon: Raynelle Bring, MD;  Location: [facility];  Service: Urology;  Laterality: Right;  SHOULDER ARTHROSCOPY WITH DISTAL CLAVICLE RESECTION Right 10/26/2016   Procedure: SHOULDER ARTHROSCOPY WITH DISTAL CLAVICLE RESECTION;  Surgeon: Justice Britain, MD;  Location: [facility];  Service: Orthopedics;  Laterality: Right;   SHOULDER ARTHROSCOPY WITH ROTATOR CUFF REPAIR AND SUBACROMIAL DECOMPRESSION Left 07/16/2014   Procedure: LEFT SHOULDER ARTHROSCOPY WITH ROTATOR CUFF REPAIR AND SUBACROMIAL DECOMPRESSION/DISTAL CLAVICLE RESECTION;  Surgeon: Marin Shutter, MD;  Location: [facility];  Service: Orthopedics;  Laterality: Left;   SHOULDER ARTHROSCOPY WITH ROTATOR CUFF REPAIR AND SUBACROMIAL DECOMPRESSION Right 10/26/2016   Procedure: RIGHT SHOULDER ARTHROSCOPY WITH ROTATOR CUFF REPAIR AND SUBACROMIAL  DECOMPRESSION;  Surgeon: Justice Britain, MD;  Location: [facility];  Service: Orthopedics;  Laterality: Right;  requests 2hrs   teeth extraction #9         Home Medications    Prior to Admission medications   Medication Sig Start Date End Date Taking? Authorizing Provider  clotrimazole-betamethasone (LOTRISONE) cream Apply to the affected area twice a day 11/09/21     Dulaglutide (TRULICITY) 1.5 TI/1.4ER SOPN Inject 1 pen under the skin once a week 02/08/22     ezetimibe-simvastatin (VYTORIN) 10-20 MG tablet Take 1 tablet by mouth once nightly. 11/28/21     ezetimibe-simvastatin (VYTORIN) 10-20 MG tablet Take 1 tablet by mouth once daily at night. 02/08/22     glucose blood (FREESTYLE LITE) test strip Use to test your blood sugar twice a day. 08/15/21     insulin detemir (LEVEMIR FLEXTOUCH) 100 UNIT/ML FlexPen Inject 22 Units into the skin daily. 08/05/21   Shelly Coss, MD  insulin detemir (LEVEMIR FLEXTOUCH) 100 UNIT/ML FlexPen Inject 18 units into the skin once a day 08/19/21     insulin detemir (LEVEMIR FLEXTOUCH) 100 UNIT/ML FlexPen Inject 32-40 units into the skin once a day 11/03/21     insulin detemir (LEVEMIR) 100 UNIT/ML FlexPen Inject 32 - 40 units into the skin once daily as directed 01/17/22     insulin detemir (LEVEMIR) 100 UNIT/ML FlexPen Inject 22 units into the skin 2 times a day 02/08/22     Insulin Pen Needle (PIP PEN NEEDLES 32G X 4MM) 32G X 4 MM MISC Use twice daily. 02/08/22     Insulin Pen Needle (UNIFINE PENTIPS) 32G X 4 MM MISC Use as directed once a day with insulin 02/01/22     Insulin Pen Needle 32G X 4 MM MISC Use as directed once daily 01/27/22     Insulin Pen Needle 32G X 5 MM MISC Use as directed once a day 09/22/21     Lancets (FREESTYLE) lancets Use to check blood sugar twice a day. 08/15/21     levothyroxine (SYNTHROID) 75 MCG tablet Take 1 tablet by mouth every morning on an empty stomach 01/25/21     losartan (COZAAR) 25 MG tablet Take 1/2 tablet by mouth at bedtime. 07/22/21    Gean Quint, MD  losartan (COZAAR) 25 MG tablet Take 1 tablet (25 mg total) by mouth at bedtime. 11/14/21     losartan (COZAAR) 25 MG tablet Take 1 tablet by mouth once a day 02/08/22     metFORMIN (GLUCOPHAGE-XR) 500 MG 24 hr tablet Take 2 tablets by mouth with food Twice a day Patient not taking: Reported on 08/08/2021 01/25/21     repaglinide (PRANDIN) 1 MG tablet Take 1/2 to 1 tablet by mouth 15 to 30 minutes before meals twice a day 02/08/22     rivaroxaban (XARELTO) 10 MG TABS tablet Take 1 tablet (10 mg total) by mouth daily with  food 02/28/21     glimepiride (AMARYL) 4 MG tablet Take 4 mg by mouth 2 (two) times daily.   06/05/20  [provider]    Family History Family History  Problem Relation Age of Onset   Cancer Mother        uterine   Diabetes Brother     Social History Social History   Tobacco Use   Smoking status: Never   Smokeless tobacco: Never  Vaping Use   Vaping Use: Never used  Substance Use Topics   Alcohol use: Yes    Comment: occasional drink    Drug use: No     Allergies   Patient has no known allergies.   Review of Systems Review of Systems  Constitutional:  Positive for fever.  Per HPI   Physical Exam Triage Vital Signs ED Triage Vitals  Enc Vitals Group     BP 04/09/22 1720 97/65     Pulse Rate 04/09/22 1720 91     Resp 04/09/22 1720 18     Temp 04/09/22 1720 98 F (36.7 C)     Temp src --      SpO2 04/09/22 1720 97 %     Weight --      Height --      Head Circumference --      Peak Flow --      Pain Score 04/09/22 1716 0     Pain Loc --      Pain Edu? --      Excl. in [place]? --    No data found.  Updated Vital Signs BP 97/65   Pulse 91   Temp 98 F (36.7 C)   Resp 18   SpO2 97%   Physical Exam Vitals and nursing note reviewed.  Constitutional:      General: He is not in acute distress. HENT:     Mouth/Throat:     Mouth: Mucous membranes are moist.     Pharynx: Uvula midline. No posterior oropharyngeal  erythema.     Tonsils: No tonsillar exudate or tonsillar abscesses.  Eyes:     Conjunctiva/sclera: Conjunctivae normal.     Pupils: Pupils are equal, round, and reactive to light.  Cardiovascular:     Rate and Rhythm: Normal rate and regular rhythm.     Heart sounds: Normal heart sounds.  Pulmonary:     Effort: Pulmonary effort is normal. No respiratory distress.     Breath sounds: Normal breath sounds. No wheezing.  Abdominal:     General: Bowel sounds are normal.     Tenderness: There is no abdominal tenderness.  Neurological:     Mental Status: He is alert and oriented to person, place, and time.      UC Treatments / Results  Labs (all labs ordered are listed, but only abnormal results are displayed) Labs Reviewed  CBG MONITORING, ED - Abnormal; Notable for the following components:      Result Value   Glucose-Capillary 214 (*)    All other components within normal limits  SARS CORONAVIRUS 2 (TAT 6-24 HRS)    EKG   Radiology No results found.  Procedures Procedures (including critical care time)  Medications Ordered in UC Medications - No data to display  Initial Impression / Assessment and Plan / UC Course  I have reviewed the triage vital signs and the nursing notes.  Pertinent labs & imaging results that were available during my care of the patient were reviewed by me  and considered in my medical decision making (see chart for details).  Well-appearing.  Afebrile in clinic.  COVID test is pending. Likely viral etiology.  Blood glucose 214. Recommend symptomatic care at home.  Follow-up with primary care provider.  Any worsening symptoms, he understands to go to the emergency department.  Patient agrees with plan.  Final Clinical Impressions(s) / UC Diagnoses   Final diagnoses:  Viral URI     Discharge Instructions      I recommend continuing symptomatic care at home.  We will call you if the COVID test returns positive.  Drink as much fluids as you  will tolerate.  If you have any worsening symptoms, please go to the emergency department.     ED Prescriptions   None    PDMP not reviewed this encounter.   Annett Boxwell, [place], [place] 04/09/22 0230

## 2022-04-10 LAB — SARS CORONAVIRUS 2 (TAT 6-24 HRS): SARS Coronavirus 2: NEGATIVE

## 2022-04-25 ENCOUNTER — Other Ambulatory Visit (HOSPITAL_COMMUNITY): Payer: Self-pay

## 2022-04-26 ENCOUNTER — Other Ambulatory Visit (HOSPITAL_COMMUNITY): Payer: Self-pay

## 2022-04-27 ENCOUNTER — Other Ambulatory Visit (HOSPITAL_COMMUNITY): Payer: Self-pay

## 2022-04-27 MED ORDER — LEVOTHYROXINE SODIUM 75 MCG PO TABS
75.0000 ug | ORAL_TABLET | Freq: Every morning | ORAL | 3 refills | Status: AC
  Filled 2022-04-27: qty 90, 90d supply, fill #0
  Filled 2022-08-05: qty 90, 90d supply, fill #1

## 2022-04-28 ENCOUNTER — Other Ambulatory Visit (HOSPITAL_COMMUNITY): Payer: Self-pay

## 2022-04-28 DIAGNOSIS — R059 Cough, unspecified: Secondary | ICD-10-CM

## 2022-04-28 DIAGNOSIS — R1310 Dysphagia, unspecified: Secondary | ICD-10-CM

## 2022-05-03 ENCOUNTER — Other Ambulatory Visit (HOSPITAL_COMMUNITY): Payer: Self-pay

## 2022-05-09 DIAGNOSIS — N183 Chronic kidney disease, stage 3 unspecified: Secondary | ICD-10-CM | POA: Diagnosis not present

## 2022-05-12 ENCOUNTER — Ambulatory Visit (HOSPITAL_COMMUNITY)
Admission: RE | Admit: 2022-05-12 | Discharge: 2022-05-12 | Disposition: A | Discharge status: Home / Self Care | Payer: 59 | Source: Ambulatory Visit | Attending: Family Medicine | Admitting: Family Medicine

## 2022-05-12 DIAGNOSIS — R1310 Dysphagia, unspecified: Secondary | ICD-10-CM

## 2022-05-12 DIAGNOSIS — R1313 Dysphagia, pharyngeal phase: Secondary | ICD-10-CM | POA: Insufficient documentation

## 2022-05-12 DIAGNOSIS — R059 Cough, unspecified: Secondary | ICD-10-CM

## 2022-05-12 NOTE — Progress Notes (Signed)
Modified Barium Swallow Progress Note  Patient Details  Name: [name] MRN: [id number] Date of Birth: 09/29/1955  Today's Date: 05/12/2022  Modified Barium Swallow completed.  Full report located under Chart Review in the Imaging Section.  Brief recommendations include the following:  Clinical Impression  Pt demonstrates an oropharyngeal dysphagia characterized by decreased mobility across pharyngeal structures that leads to mild silent aspiration of liquids, regardless of texture. Pt can achieve adequate glottic closure at the height of the swallow, but epiglottic deflection, reduced laryngeal elevation and reduced UES opening lead to penetration and residue that falls into the airway just as glottis opens.   SLP attempted a variety of compenstory strategies including head turn left, head tilt right, chin tuck, supraglottic swallow. None prevented aspiration. A super-superglotttic swallow may be beneficial, mostly because pt can eject most aspirate with a cued cough and throat clear. With solids there is mild to moderate residue in pyriforms and valleculae due to the the same weakness. Pt usually uses a liquid wash to clear. This is likely the cause of pts globus sensation. A chin tuck with solids did clear a bit more of the stasis than an upright swallow. Effortful swallow not effective.   Provided visual feedback to pt and provided extensive education and recommendations post session. Would not recommend any texture modification as there is no benefit to thickened liquids for this pt. Futhermore, pt has not had any recent repiratory infection that would warrant excessive precaution. However, recommended pt: 1. trial a chin tuck with solids, 2. cough and clear throat frequently with intake even if he doesnt feel the need to do so, 3. complete thorough oral care to reduce bacterial load with aspiration, 4. inform providers of dysphagia and 5. consider f/u with SLP for home exercise program.  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 Exercise and tx targeting pharyngeal musculature could be helpful.   Swallow Evaluation Recommendations       SLP Diet Recommendations: Regular solids;Thin liquid   Liquid Administration via: Cup;Straw   Medication Administration: Whole meds with liquid   Supervision: Patient able to self feed   Compensations: Slow rate;Small sips/bites;Follow solids with liquid;Clear throat intermittently;Clear throat after each swallow;Chin tuck       Oral Care Recommendations: Oral care before and after PO        Price [place], Katherene Ponto 05/12/2022,2:04 PM

## 2022-05-15 ENCOUNTER — Other Ambulatory Visit (HOSPITAL_COMMUNITY): Payer: Self-pay

## 2022-05-15 DIAGNOSIS — N183 Chronic kidney disease, stage 3 unspecified: Secondary | ICD-10-CM | POA: Diagnosis not present

## 2022-05-15 DIAGNOSIS — I129 Hypertensive chronic kidney disease with stage 1 through stage 4 chronic kidney disease, or unspecified chronic kidney disease: Secondary | ICD-10-CM | POA: Diagnosis not present

## 2022-05-15 DIAGNOSIS — D631 Anemia in chronic kidney disease: Secondary | ICD-10-CM | POA: Diagnosis not present

## 2022-05-15 DIAGNOSIS — R809 Proteinuria, unspecified: Secondary | ICD-10-CM | POA: Diagnosis not present

## 2022-05-15 DIAGNOSIS — Z85528 Personal history of other malignant neoplasm of kidney: Secondary | ICD-10-CM | POA: Diagnosis not present

## 2022-05-15 DIAGNOSIS — N2581 Secondary hyperparathyroidism of renal origin: Secondary | ICD-10-CM | POA: Diagnosis not present

## 2022-05-21 ENCOUNTER — Other Ambulatory Visit (HOSPITAL_COMMUNITY): Payer: Self-pay

## 2022-05-22 ENCOUNTER — Other Ambulatory Visit (HOSPITAL_COMMUNITY): Payer: Self-pay

## 2022-05-22 ENCOUNTER — Ambulatory Visit
Admission: RE | Admit: 2022-05-22 | Discharge: 2022-05-22 | Disposition: A | Discharge status: Home / Self Care | Payer: 59 | Source: Ambulatory Visit | Attending: Family Medicine | Admitting: Family Medicine

## 2022-05-22 ENCOUNTER — Other Ambulatory Visit: Payer: Self-pay | Admitting: Family Medicine

## 2022-05-22 DIAGNOSIS — R0989 Other specified symptoms and signs involving the circulatory and respiratory systems: Secondary | ICD-10-CM

## 2022-05-22 DIAGNOSIS — J984 Other disorders of lung: Secondary | ICD-10-CM | POA: Diagnosis not present

## 2022-05-22 MED ORDER — XARELTO 10 MG PO TABS
10.0000 mg | ORAL_TABLET | Freq: Every day | ORAL | 4 refills | Status: AC
  Filled 2022-05-22: qty 90, 90d supply, fill #0
  Filled 2022-08-17: qty 90, 90d supply, fill #1

## 2022-05-23 ENCOUNTER — Other Ambulatory Visit (HOSPITAL_COMMUNITY): Payer: Self-pay

## 2022-05-24 ENCOUNTER — Other Ambulatory Visit: Payer: Self-pay | Admitting: Family Medicine

## 2022-05-24 ENCOUNTER — Other Ambulatory Visit (HOSPITAL_COMMUNITY): Payer: Self-pay

## 2022-05-24 DIAGNOSIS — R9389 Abnormal findings on diagnostic imaging of other specified body structures: Secondary | ICD-10-CM

## 2022-05-31 ENCOUNTER — Other Ambulatory Visit (HOSPITAL_COMMUNITY): Payer: Self-pay

## 2022-05-31 DIAGNOSIS — C641 Malignant neoplasm of right kidney, except renal pelvis: Secondary | ICD-10-CM | POA: Diagnosis not present

## 2022-06-01 DIAGNOSIS — M25561 Pain in right knee: Secondary | ICD-10-CM | POA: Diagnosis not present

## 2022-06-02 DIAGNOSIS — Z905 Acquired absence of kidney: Secondary | ICD-10-CM | POA: Diagnosis not present

## 2022-06-02 DIAGNOSIS — R918 Other nonspecific abnormal finding of lung field: Secondary | ICD-10-CM | POA: Diagnosis not present

## 2022-06-02 DIAGNOSIS — I2510 Atherosclerotic heart disease of native coronary artery without angina pectoris: Secondary | ICD-10-CM | POA: Diagnosis not present

## 2022-06-02 DIAGNOSIS — I700 Atherosclerosis of aorta: Secondary | ICD-10-CM | POA: Diagnosis not present

## 2022-06-02 DIAGNOSIS — C641 Malignant neoplasm of right kidney, except renal pelvis: Secondary | ICD-10-CM | POA: Diagnosis not present

## 2022-06-02 DIAGNOSIS — J984 Other disorders of lung: Secondary | ICD-10-CM | POA: Diagnosis not present

## 2022-06-05 ENCOUNTER — Other Ambulatory Visit (HOSPITAL_COMMUNITY): Payer: Self-pay

## 2022-06-06 DIAGNOSIS — M25561 Pain in right knee: Secondary | ICD-10-CM | POA: Diagnosis not present

## 2022-06-07 DIAGNOSIS — C641 Malignant neoplasm of right kidney, except renal pelvis: Secondary | ICD-10-CM | POA: Diagnosis not present

## 2022-06-16 DIAGNOSIS — Z23 Encounter for immunization: Secondary | ICD-10-CM | POA: Diagnosis not present

## 2022-06-18 DIAGNOSIS — R059 Cough, unspecified: Secondary | ICD-10-CM | POA: Diagnosis not present

## 2022-06-18 DIAGNOSIS — M25512 Pain in left shoulder: Secondary | ICD-10-CM | POA: Diagnosis not present

## 2022-06-19 ENCOUNTER — Other Ambulatory Visit: Payer: 59

## 2022-06-22 ENCOUNTER — Other Ambulatory Visit (HOSPITAL_COMMUNITY): Payer: Self-pay

## 2022-06-22 DIAGNOSIS — Z6825 Body mass index (BMI) 25.0-25.9, adult: Secondary | ICD-10-CM | POA: Diagnosis not present

## 2022-06-22 DIAGNOSIS — M62830 Muscle spasm of back: Secondary | ICD-10-CM | POA: Diagnosis not present

## 2022-06-22 DIAGNOSIS — M546 Pain in thoracic spine: Secondary | ICD-10-CM | POA: Diagnosis not present

## 2022-06-22 MED ORDER — METHOCARBAMOL 750 MG PO TABS
750.0000 mg | ORAL_TABLET | ORAL | 1 refills | Status: DC | PRN
  Filled 2022-06-22: qty 90, 15d supply, fill #0

## 2022-07-05 ENCOUNTER — Emergency Department (HOSPITAL_BASED_OUTPATIENT_CLINIC_OR_DEPARTMENT_OTHER): Payer: 59

## 2022-07-05 ENCOUNTER — Encounter (HOSPITAL_COMMUNITY): Payer: Self-pay

## 2022-07-05 ENCOUNTER — Emergency Department (HOSPITAL_BASED_OUTPATIENT_CLINIC_OR_DEPARTMENT_OTHER): Payer: 59 | Admitting: Radiology

## 2022-07-05 ENCOUNTER — Encounter (HOSPITAL_BASED_OUTPATIENT_CLINIC_OR_DEPARTMENT_OTHER): Payer: Self-pay | Admitting: Emergency Medicine

## 2022-07-05 ENCOUNTER — Other Ambulatory Visit: Payer: Self-pay

## 2022-07-05 ENCOUNTER — Inpatient Hospital Stay (HOSPITAL_BASED_OUTPATIENT_CLINIC_OR_DEPARTMENT_OTHER)
Admission: EM | Admit: 2022-07-05 | Discharge: 2022-07-14 | DRG: 871 | Disposition: A | Discharge status: Home / Self Care | Payer: 59 | Attending: Family Medicine | Admitting: Family Medicine

## 2022-07-05 DIAGNOSIS — R059 Cough, unspecified: Secondary | ICD-10-CM | POA: Diagnosis not present

## 2022-07-05 DIAGNOSIS — J9601 Acute respiratory failure with hypoxia: Secondary | ICD-10-CM | POA: Diagnosis not present

## 2022-07-05 DIAGNOSIS — E876 Hypokalemia: Secondary | ICD-10-CM | POA: Diagnosis present

## 2022-07-05 DIAGNOSIS — Z8619 Personal history of other infectious and parasitic diseases: Secondary | ICD-10-CM

## 2022-07-05 DIAGNOSIS — Z79890 Hormone replacement therapy (postmenopausal): Secondary | ICD-10-CM

## 2022-07-05 DIAGNOSIS — E871 Hypo-osmolality and hyponatremia: Secondary | ICD-10-CM | POA: Diagnosis present

## 2022-07-05 DIAGNOSIS — N1832 Chronic kidney disease, stage 3b: Secondary | ICD-10-CM | POA: Diagnosis present

## 2022-07-05 DIAGNOSIS — Z85528 Personal history of other malignant neoplasm of kidney: Secondary | ICD-10-CM

## 2022-07-05 DIAGNOSIS — Z905 Acquired absence of kidney: Secondary | ICD-10-CM | POA: Diagnosis not present

## 2022-07-05 DIAGNOSIS — E11649 Type 2 diabetes mellitus with hypoglycemia without coma: Secondary | ICD-10-CM | POA: Diagnosis not present

## 2022-07-05 DIAGNOSIS — I3139 Other pericardial effusion (noninflammatory): Secondary | ICD-10-CM | POA: Diagnosis not present

## 2022-07-05 DIAGNOSIS — E1165 Type 2 diabetes mellitus with hyperglycemia: Secondary | ICD-10-CM | POA: Diagnosis present

## 2022-07-05 DIAGNOSIS — T182XXA Foreign body in stomach, initial encounter: Secondary | ICD-10-CM | POA: Diagnosis not present

## 2022-07-05 DIAGNOSIS — Z923 Personal history of irradiation: Secondary | ICD-10-CM | POA: Diagnosis not present

## 2022-07-05 DIAGNOSIS — K2289 Other specified disease of esophagus: Secondary | ICD-10-CM | POA: Diagnosis not present

## 2022-07-05 DIAGNOSIS — Z6824 Body mass index (BMI) 24.0-24.9, adult: Secondary | ICD-10-CM | POA: Diagnosis not present

## 2022-07-05 DIAGNOSIS — R0602 Shortness of breath: Secondary | ICD-10-CM | POA: Diagnosis not present

## 2022-07-05 DIAGNOSIS — J869 Pyothorax without fistula: Secondary | ICD-10-CM | POA: Diagnosis not present

## 2022-07-05 DIAGNOSIS — J159 Unspecified bacterial pneumonia: Secondary | ICD-10-CM | POA: Diagnosis present

## 2022-07-05 DIAGNOSIS — D638 Anemia in other chronic diseases classified elsewhere: Secondary | ICD-10-CM | POA: Diagnosis not present

## 2022-07-05 DIAGNOSIS — R091 Pleurisy: Secondary | ICD-10-CM | POA: Diagnosis not present

## 2022-07-05 DIAGNOSIS — E1140 Type 2 diabetes mellitus with diabetic neuropathy, unspecified: Secondary | ICD-10-CM | POA: Diagnosis present

## 2022-07-05 DIAGNOSIS — E039 Hypothyroidism, unspecified: Secondary | ICD-10-CM | POA: Diagnosis present

## 2022-07-05 DIAGNOSIS — Z9221 Personal history of antineoplastic chemotherapy: Secondary | ICD-10-CM

## 2022-07-05 DIAGNOSIS — R079 Chest pain, unspecified: Secondary | ICD-10-CM | POA: Diagnosis not present

## 2022-07-05 DIAGNOSIS — N183 Chronic kidney disease, stage 3 unspecified: Secondary | ICD-10-CM | POA: Insufficient documentation

## 2022-07-05 DIAGNOSIS — J948 Other specified pleural conditions: Secondary | ICD-10-CM | POA: Diagnosis present

## 2022-07-05 DIAGNOSIS — Z86711 Personal history of pulmonary embolism: Secondary | ICD-10-CM

## 2022-07-05 DIAGNOSIS — D72829 Elevated white blood cell count, unspecified: Secondary | ICD-10-CM | POA: Insufficient documentation

## 2022-07-05 DIAGNOSIS — K2990 Gastroduodenitis, unspecified, without bleeding: Secondary | ICD-10-CM | POA: Diagnosis not present

## 2022-07-05 DIAGNOSIS — E785 Hyperlipidemia, unspecified: Secondary | ICD-10-CM | POA: Diagnosis present

## 2022-07-05 DIAGNOSIS — L821 Other seborrheic keratosis: Secondary | ICD-10-CM | POA: Diagnosis present

## 2022-07-05 DIAGNOSIS — J90 Pleural effusion, not elsewhere classified: Secondary | ICD-10-CM | POA: Diagnosis present

## 2022-07-05 DIAGNOSIS — J690 Pneumonitis due to inhalation of food and vomit: Secondary | ICD-10-CM | POA: Diagnosis present

## 2022-07-05 DIAGNOSIS — A419 Sepsis, unspecified organism: Principal | ICD-10-CM | POA: Diagnosis present

## 2022-07-05 DIAGNOSIS — J3801 Paralysis of vocal cords and larynx, unilateral: Secondary | ICD-10-CM | POA: Diagnosis present

## 2022-07-05 DIAGNOSIS — Z85810 Personal history of malignant neoplasm of tongue: Secondary | ICD-10-CM

## 2022-07-05 DIAGNOSIS — R846 Abnormal cytological findings in specimens from respiratory organs and thorax: Secondary | ICD-10-CM | POA: Diagnosis not present

## 2022-07-05 DIAGNOSIS — R1310 Dysphagia, unspecified: Secondary | ICD-10-CM

## 2022-07-05 DIAGNOSIS — J918 Pleural effusion in other conditions classified elsewhere: Secondary | ICD-10-CM | POA: Diagnosis present

## 2022-07-05 DIAGNOSIS — D631 Anemia in chronic kidney disease: Secondary | ICD-10-CM | POA: Diagnosis present

## 2022-07-05 DIAGNOSIS — Z794 Long term (current) use of insulin: Secondary | ICD-10-CM

## 2022-07-05 DIAGNOSIS — R1314 Dysphagia, pharyngoesophageal phase: Secondary | ICD-10-CM | POA: Diagnosis not present

## 2022-07-05 DIAGNOSIS — R1312 Dysphagia, oropharyngeal phase: Secondary | ICD-10-CM | POA: Diagnosis present

## 2022-07-05 DIAGNOSIS — K2980 Duodenitis without bleeding: Secondary | ICD-10-CM | POA: Diagnosis present

## 2022-07-05 DIAGNOSIS — Z833 Family history of diabetes mellitus: Secondary | ICD-10-CM

## 2022-07-05 DIAGNOSIS — R509 Fever, unspecified: Secondary | ICD-10-CM | POA: Diagnosis not present

## 2022-07-05 DIAGNOSIS — Z79899 Other long term (current) drug therapy: Secondary | ICD-10-CM

## 2022-07-05 DIAGNOSIS — Z7901 Long term (current) use of anticoagulants: Secondary | ICD-10-CM

## 2022-07-05 DIAGNOSIS — M1990 Unspecified osteoarthritis, unspecified site: Secondary | ICD-10-CM | POA: Diagnosis present

## 2022-07-05 DIAGNOSIS — R634 Abnormal weight loss: Secondary | ICD-10-CM | POA: Diagnosis not present

## 2022-07-05 DIAGNOSIS — Z7985 Long-term (current) use of injectable non-insulin antidiabetic drugs: Secondary | ICD-10-CM

## 2022-07-05 DIAGNOSIS — K2950 Unspecified chronic gastritis without bleeding: Secondary | ICD-10-CM | POA: Diagnosis not present

## 2022-07-05 DIAGNOSIS — J939 Pneumothorax, unspecified: Secondary | ICD-10-CM | POA: Diagnosis not present

## 2022-07-05 DIAGNOSIS — D75839 Thrombocytosis, unspecified: Secondary | ICD-10-CM | POA: Diagnosis present

## 2022-07-05 DIAGNOSIS — Z86718 Personal history of other venous thrombosis and embolism: Secondary | ICD-10-CM

## 2022-07-05 DIAGNOSIS — E119 Type 2 diabetes mellitus without complications: Secondary | ICD-10-CM | POA: Diagnosis not present

## 2022-07-05 DIAGNOSIS — J9811 Atelectasis: Secondary | ICD-10-CM | POA: Diagnosis not present

## 2022-07-05 DIAGNOSIS — Z7984 Long term (current) use of oral hypoglycemic drugs: Secondary | ICD-10-CM

## 2022-07-05 DIAGNOSIS — E1122 Type 2 diabetes mellitus with diabetic chronic kidney disease: Secondary | ICD-10-CM | POA: Diagnosis present

## 2022-07-05 DIAGNOSIS — J189 Pneumonia, unspecified organism: Secondary | ICD-10-CM | POA: Diagnosis present

## 2022-07-05 LAB — CBC WITH DIFFERENTIAL/PLATELET
Abs Immature Granulocytes: 0.27 10*3/uL — ABNORMAL HIGH (ref 0.00–0.07)
Basophils Absolute: 0 10*3/uL (ref 0.0–0.1)
Basophils Relative: 0 %
Eosinophils Absolute: 0 10*3/uL (ref 0.0–0.5)
Eosinophils Relative: 0 %
HCT: 39.8 % (ref 39.0–52.0)
Hemoglobin: 12.5 g/dL — ABNORMAL LOW (ref 13.0–17.0)
Immature Granulocytes: 1 %
Lymphocytes Relative: 3 %
Lymphs Abs: 0.6 10*3/uL — ABNORMAL LOW (ref 0.7–4.0)
MCH: 28 pg (ref 26.0–34.0)
MCHC: 31.4 g/dL (ref 30.0–36.0)
MCV: 89.2 fL (ref 80.0–100.0)
Monocytes Absolute: 1 10*3/uL (ref 0.1–1.0)
Monocytes Relative: 4 %
Neutro Abs: 21.8 10*3/uL — ABNORMAL HIGH (ref 1.7–7.7)
Neutrophils Relative %: 92 %
Platelets: 679 10*3/uL — ABNORMAL HIGH (ref 150–400)
RBC: 4.46 MIL/uL (ref 4.22–5.81)
RDW: 13.4 % (ref 11.5–15.5)
WBC: 23.7 10*3/uL — ABNORMAL HIGH (ref 4.0–10.5)
nRBC: 0 % (ref 0.0–0.2)

## 2022-07-05 LAB — PROTIME-INR
INR: 1.4 — ABNORMAL HIGH (ref 0.8–1.2)
Prothrombin Time: 17 seconds — ABNORMAL HIGH (ref 11.4–15.2)

## 2022-07-05 LAB — COMPREHENSIVE METABOLIC PANEL
ALT: 23 U/L (ref 0–44)
AST: 28 U/L (ref 15–41)
Albumin: 3.3 g/dL — ABNORMAL LOW (ref 3.5–5.0)
Alkaline Phosphatase: 101 U/L (ref 38–126)
Anion gap: 11 (ref 5–15)
BUN: 37 mg/dL — ABNORMAL HIGH (ref 8–23)
CO2: 23 mmol/L (ref 22–32)
Calcium: 9.4 mg/dL (ref 8.9–10.3)
Chloride: 98 mmol/L (ref 98–111)
Creatinine, Ser: 2.05 mg/dL — ABNORMAL HIGH (ref 0.61–1.24)
GFR, Estimated: 35 mL/min — ABNORMAL LOW (ref >60)
Glucose, Bld: 243 mg/dL — ABNORMAL HIGH (ref 70–99)
Potassium: 4.8 mmol/L (ref 3.5–5.1)
Sodium: 132 mmol/L — ABNORMAL LOW (ref 135–145)
Total Bilirubin: 0.5 mg/dL (ref 0.3–1.2)
Total Protein: 7.6 g/dL (ref 6.5–8.1)

## 2022-07-05 LAB — APTT: aPTT: 47 seconds — ABNORMAL HIGH (ref 24–36)

## 2022-07-05 LAB — LACTIC ACID, PLASMA
Lactic Acid, Venous: 1.1 mmol/L (ref 0.5–1.9)
Lactic Acid, Venous: 0.6 mmol/L (ref 0.5–1.9)

## 2022-07-05 MED ORDER — SODIUM CHLORIDE 0.9 % IV SOLN
500.0000 mg | Freq: Once | INTRAVENOUS | Status: AC
  Administered 2022-07-05: 500 mg via INTRAVENOUS
  Filled 2022-07-05: qty 5

## 2022-07-05 MED ORDER — OXYCODONE HCL 5 MG PO TABS
5.0000 mg | ORAL_TABLET | ORAL | Status: DC | PRN
  Administered 2022-07-08 – 2022-07-12 (×5): 5 mg via ORAL
  Filled 2022-07-05 (×5): qty 1

## 2022-07-05 MED ORDER — ONDANSETRON HCL 4 MG/2ML IJ SOLN: 4.0000 mg | Freq: Four times a day (QID) | INTRAMUSCULAR | Status: DC | PRN

## 2022-07-05 MED ORDER — ACETAMINOPHEN 325 MG PO TABS
650.0000 mg | ORAL_TABLET | Freq: Four times a day (QID) | ORAL | Status: DC | PRN
  Administered 2022-07-07: 650 mg via ORAL
  Filled 2022-07-05: qty 2

## 2022-07-05 MED ORDER — IOHEXOL 350 MG/ML SOLN
100.0000 mL | Freq: Once | INTRAVENOUS | Status: AC | PRN
  Administered 2022-07-05: 65 mL via INTRAVENOUS

## 2022-07-05 MED ORDER — INSULIN DETEMIR 100 UNIT/ML ~~LOC~~ SOLN
18.0000 [IU] | Freq: Every day | SUBCUTANEOUS | Status: DC
  Administered 2022-07-06 – 2022-07-09 (×5): 18 [IU] via SUBCUTANEOUS
  Filled 2022-07-05 (×7): qty 0.18

## 2022-07-05 MED ORDER — SODIUM CHLORIDE 0.9 % IV SOLN
1.0000 g | Freq: Once | INTRAVENOUS | Status: AC
  Administered 2022-07-05: 1 g via INTRAVENOUS
  Filled 2022-07-05: qty 10

## 2022-07-05 MED ORDER — LEVOTHYROXINE SODIUM 75 MCG PO TABS
75.0000 ug | ORAL_TABLET | Freq: Every morning | ORAL | Status: DC
  Administered 2022-07-06 – 2022-07-14 (×8): 75 ug via ORAL
  Filled 2022-07-05 (×8): qty 1

## 2022-07-05 MED ORDER — AZITHROMYCIN 500 MG PO TABS
500.0000 mg | ORAL_TABLET | Freq: Every day | ORAL | Status: DC
  Filled 2022-07-05 (×2): qty 1

## 2022-07-05 MED ORDER — ONDANSETRON HCL 4 MG PO TABS: 4.0000 mg | ORAL_TABLET | Freq: Four times a day (QID) | ORAL | Status: DC | PRN

## 2022-07-05 MED ORDER — SODIUM CHLORIDE 0.9 % IV BOLUS
500.0000 mL | Freq: Once | INTRAVENOUS | Status: AC
  Administered 2022-07-05: 500 mL via INTRAVENOUS

## 2022-07-05 MED ORDER — ACETAMINOPHEN 650 MG RE SUPP: 650.0000 mg | Freq: Four times a day (QID) | RECTAL | Status: DC | PRN

## 2022-07-05 MED ORDER — INSULIN ASPART 100 UNIT/ML IJ SOLN: 0.0000 [IU] | Freq: Three times a day (TID) | INTRAMUSCULAR | Status: DC

## 2022-07-05 MED ORDER — ENOXAPARIN SODIUM 40 MG/0.4ML IJ SOSY: 40.0000 mg | PREFILLED_SYRINGE | INTRAMUSCULAR | Status: DC

## 2022-07-05 MED ORDER — SODIUM CHLORIDE 0.9 % IV SOLN
2.0000 g | INTRAVENOUS | Status: AC
  Administered 2022-07-06 – 2022-07-10 (×5): 2 g via INTRAVENOUS
  Filled 2022-07-05 (×5): qty 20

## 2022-07-05 MED ORDER — INSULIN ASPART 100 UNIT/ML IJ SOLN: 6.0000 [IU] | Freq: Three times a day (TID) | INTRAMUSCULAR | Status: DC

## 2022-07-05 NOTE — H&P (Incomplete)
History and Physical    [name] KGM:[id number] DOB: 24-Apr-1956 DOA: 07/05/2022  PCP: [name], MD   Chief Complaint: cough, sob  HPI: [name] is a 66 y.o. male with medical history significant of squamous cell carcinoma of the tongue, type 2 diabetes on insulin, DVT, history of radiation, hyperlipidemia, hypothyroidism who presentedTo the emergency department at an outside facility due to cough and shortness of breath.  Patient states that his symptoms started after he received the flu shot 3 weeks ago.  He thought he pulled a muscle in his left chest wall and was given Robaxin.  He states his pain is just continued particularly with breathing.  His symptoms are mostly controlled at home however his shortness of breath and cough continued to worsen prompting presentation to the ER.  On arrival to the emergency department he was afebrile and hypoxic satting in the 80s.  He is also tachycardic in the 120s.  Labs were obtained which were notable for sodium 132, creatinine 2.05 (baseline 1.5-2.3) lactic acid 1.1, WBC 23.7, hemoglobin 12.5, platelets 679, blood cultures were drawn and are currently in process, INR 1.4, chest x-ray was obtained which showed large opacification in the left mid and left lower lung fields consistent with pneumonia and a pleural effusion.  CT PE study was ordered which demonstrated no evidence of pulmonary embolism however interval worsening with diffuse bronchial thickening of right lower and middle lobes concerning for atypical infection.  There is also moderate left-sided pleural effusion.  Mediastinal hilar lymphadenopathy was seen with recommended follow-up.  On evaluation patient was breathing comfortably on room air.  He endorses persistent cough and pleuritic chest pain.  He was started on ceftriaxone and azithromycin.  Review of Systems: ROS   As per HPI otherwise 10 point review of systems negative.   No Known Allergies  Past Medical  History:  Diagnosis Date  . Alopecia    s/p chemotherapy  . Arthritis   . Bursitis    pt. denies  . 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 Diabetes mellitus without complication ([place])    takes Amaryl,Januvia,and Metformin,daily  . DVT, lower extremity ([place]) 2009   side effect from chemo and has blood clot left leg-wears compression stockings;was on Coumadin for 59months and has been off 5 yrs  . History of chemotherapy    Cisplatin/Taxotere,5-FU  . History of radiation therapy 01/28/08-03/11/08   left base tongue  . History of shingles   . Hyperlipidemia    takes Vytorin daily  . Hypothyroidism    takes Synthroid daily  . Joint pain   . Joint swelling   . Neuropathy    left foot  . 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 PE (pulmonary embolism) 12/07/2014  . Pneumonia    pt. denies  . Right kidney mass   . Seborrheic keratosis    multiple on back    Past Surgical History:  Procedure Laterality Date  . CARDIAC CATHETERIZATION    . feeding tube placed  2009 and then removed  . KNEE SURGERY Left 1993  . LYMPH NODE DISSECTION Right 08/05/2020   Procedure: LYMPH NODE DISSECTION;  Surgeon: Raynelle Bring, MD;  [facility];  Service: Urology;  Laterality: Right;  . MOUTH SURGERY    . PILONIDAL CYST / SINUS EXCISION  1974  . port a cath placement    . PORT-A-CATH REMOVAL  2010  . right wrist fracture  Right 2004  . ROBOT ASSISTED LAPAROSCOPIC NEPHRECTOMY Right 08/05/2020   Procedure: XI ROBOTIC ASSISTED LAPAROSCOPIC RADICAL NEPHRECTOMY/PARTIAL ADRENALECTOMY;  Surgeon: Raynelle Bring, MD;  [facility];  Service: Urology;  Laterality: Right;  . SHOULDER ARTHROSCOPY WITH DISTAL CLAVICLE RESECTION Right 10/26/2016   Procedure: SHOULDER ARTHROSCOPY WITH DISTAL CLAVICLE RESECTION;  Surgeon: Justice Britain, MD;  Location: [facility];  Service: Orthopedics;  Laterality: Right;  . SHOULDER ARTHROSCOPY WITH ROTATOR CUFF REPAIR AND  SUBACROMIAL DECOMPRESSION Left 07/16/2014   Procedure: LEFT SHOULDER ARTHROSCOPY WITH ROTATOR CUFF REPAIR AND SUBACROMIAL DECOMPRESSION/DISTAL CLAVICLE RESECTION;  Surgeon: Marin Shutter, MD;  Location: [facility];  Service: Orthopedics;  Laterality: Left;  . SHOULDER ARTHROSCOPY WITH ROTATOR CUFF REPAIR AND SUBACROMIAL DECOMPRESSION Right 10/26/2016   Procedure: RIGHT SHOULDER ARTHROSCOPY WITH ROTATOR CUFF REPAIR AND SUBACROMIAL DECOMPRESSION;  Surgeon: Justice Britain, MD;  Location: [facility];  Service: Orthopedics;  Laterality: Right;  requests 2hrs  . teeth extraction #9       reports that he has never smoked. He has never used smokeless tobacco. He reports current alcohol use. He reports that he does not use drugs.  Family History  Problem Relation Age of Onset  . Cancer Mother        uterine  . Diabetes Brother     Prior to Admission medications   Medication Sig Start Date End Date Taking? Authorizing Provider  clotrimazole-betamethasone (LOTRISONE) cream Apply to the affected area twice a day 11/09/21     Dulaglutide (TRULICITY) 1.5 ZO/1.0RU SOPN Inject 1 pen under the skin once a week 02/08/22     ezetimibe-simvastatin (VYTORIN) 10-20 MG tablet Take 1 tablet by mouth once nightly. 11/28/21     ezetimibe-simvastatin (VYTORIN) 10-20 MG tablet Take 1 tablet by mouth once daily at night. 02/08/22     glucose blood (FREESTYLE LITE) test strip Use to test your blood sugar twice a day. 08/15/21     insulin detemir (LEVEMIR FLEXTOUCH) 100 UNIT/ML FlexPen Inject 22 Units into the skin daily. 08/05/21   Shelly Coss, MD  insulin detemir (LEVEMIR FLEXTOUCH) 100 UNIT/ML FlexPen Inject 18 units into the skin once a day 08/19/21     insulin detemir (LEVEMIR FLEXTOUCH) 100 UNIT/ML FlexPen Inject 32-40 units into the skin once a day 11/03/21     insulin detemir (LEVEMIR) 100 UNIT/ML FlexPen Inject 32 - 40 units into the skin once daily as directed 01/17/22     insulin detemir (LEVEMIR) 100 UNIT/ML FlexPen Inject 22  units into the skin 2 times a day 02/08/22     Insulin Pen Needle (PIP PEN NEEDLES 32G X 4MM) 32G X 4 MM MISC Use twice daily. 02/08/22     Insulin Pen Needle (UNIFINE PENTIPS) 32G X 4 MM MISC Use as directed once a day with insulin 02/01/22     Insulin Pen Needle 32G X 4 MM MISC Use as directed once daily 01/27/22     Insulin Pen Needle 32G X 5 MM MISC Use as directed once a day 09/22/21     Lancets (FREESTYLE) lancets Use to check blood sugar twice a day. 08/15/21     levothyroxine (SYNTHROID) 75 MCG tablet Take 1 tablet by mouth every morning on an empty stomach 04/27/22     losartan (COZAAR) 25 MG tablet Take 1/2 tablet by mouth at bedtime. 07/22/21   Gean Quint, MD  losartan (COZAAR) 25 MG tablet Take 1 tablet (25 mg total) by mouth at bedtime. 11/14/21     losartan (COZAAR) 25 MG  tablet Take 1 tablet by mouth once a day 02/08/22     metFORMIN (GLUCOPHAGE-XR) 500 MG 24 hr tablet Take 2 tablets by mouth with food Twice a day Patient not taking: Reported on 08/08/2021 01/25/21     methocarbamol (ROBAXIN) 750 MG tablet Take 1 tablet (750 mg total) by mouth every 4 (four) hours as needed for tight muscles. 06/22/22     repaglinide (PRANDIN) 1 MG tablet Take 1/2 to 1 tablet by mouth 15 to 30 minutes before meals twice a day 02/08/22     rivaroxaban (XARELTO) 10 MG TABS tablet TAKE 1 TABLET BY MOUTH ONCE DAILY WITH FOOD. 05/22/22     glimepiride (AMARYL) 4 MG tablet Take 4 mg by mouth 2 (two) times daily.   06/05/20  [provider]    Physical Exam: Vitals:   07/05/22 2100 07/05/22 2115 07/05/22 2130 07/05/22 2237  BP: 113/65 131/69 102/66 (!) 157/89  Pulse: (!) 106 (!) 105 (!) 103 100  Resp: (!) 29 13 (!) 27 18  Temp:    98.4 F (36.9 C)  TempSrc:    Oral  SpO2: (!) 89% 94% 90% 95%  Weight:      Height:       Physical Exam Vitals reviewed.  Constitutional:      Appearance: He is normal weight.  HENT:     Nose: Nose normal.     Mouth/Throat:     Mouth: Mucous membranes are moist.      Pharynx: Oropharynx is clear.  Eyes:     Pupils: Pupils are equal, round, and reactive to light.  Cardiovascular:     Rate and Rhythm: Regular rhythm. Tachycardia present.     Pulses: Normal pulses.     Heart sounds: Normal heart sounds.  Pulmonary:     Effort: Pulmonary effort is normal. No respiratory distress.     Breath sounds: Rhonchi and rales present.  Abdominal:     General: Abdomen is flat.     Palpations: Abdomen is soft.  Musculoskeletal:        General: Normal range of motion.  Skin:    General: Skin is warm.     Capillary Refill: Capillary refill takes less than 2 seconds.  Neurological:     General: No focal deficit present.     Mental Status: He is alert. Mental status is at baseline.       Labs on Admission: I have personally reviewed the patients's labs and imaging studies.  Assessment/Plan Principal Problem:   CAP (community acquired pneumonia) Active Problems:   Community acquired bacterial pneumonia     Admission status: Inpatient Telemetry Medical  Certification: The appropriate patient status for this patient is INPATIENT. Inpatient status is judged to be reasonable and necessary in order to provide the required intensity of service to ensure the patient's safety. The patient's presenting symptoms, physical exam findings, and initial radiographic and laboratory data in the context of their chronic comorbidities is felt to place them at high risk for further clinical deterioration. Furthermore, it is not anticipated that the patient will be medically stable for discharge from the hospital within 2 midnights of admission.   * I certify that at the point of admission it is my clinical judgment that the patient will require inpatient hospital care spanning beyond 2 midnights from the point of admission due to high intensity of service, high risk for further deterioration and high frequency of surveillance required.Emilee Hero MD [facility]  If 7PM-7AM, please contact night-coverage [email or web address]  07/05/2022, 11:56 PM

## 2022-07-05 NOTE — ED Notes (Signed)
[facility] at bedside 

## 2022-07-05 NOTE — ED Triage Notes (Signed)
Cough, fever and chest congestion. Seen by [facility]. Suggested eval for pneumonia.  Had "pulled chest muscle" around 06/16/22

## 2022-07-05 NOTE — ED Provider Notes (Signed)
[facility] EMERGENCY DEPT Provider Note   CSN: [id number] Arrival date & time: 07/05/22  1629     History  Chief Complaint  Patient presents with   Cough    [name] is a 66 y.o. male.  Patient with history of renal cancer and history of esophageal cancer, previously treated and no active therapy, diabetes -- presents to the emergency department for evaluation of chest pain, cough, shortness of breath.  Patient states that he received a flu shot around 06/15/2022.  Shortly after this, he developed pain in his left chest and developed a cough.  Initially thought he had just pulled muscle.  Pain has become worse with deep breathing.  He feels like he has to cough something up but cannot get it up.  No high fevers but possible low-grade fevers.  Shortness of breath is started to interfere with daily activities.         Home Medications Prior to Admission medications   Medication Sig Start Date End Date Taking? Authorizing Provider  clotrimazole-betamethasone (LOTRISONE) cream Apply to the affected area twice a day 11/09/21     Dulaglutide (TRULICITY) 1.5 JE/5.6DJ SOPN Inject 1 pen under the skin once a week 02/08/22     ezetimibe-simvastatin (VYTORIN) 10-20 MG tablet Take 1 tablet by mouth once nightly. 11/28/21     ezetimibe-simvastatin (VYTORIN) 10-20 MG tablet Take 1 tablet by mouth once daily at night. 02/08/22     glucose blood (FREESTYLE LITE) test strip Use to test your blood sugar twice a day. 08/15/21     insulin detemir (LEVEMIR FLEXTOUCH) 100 UNIT/ML FlexPen Inject 22 Units into the skin daily. 08/05/21   Shelly Coss, MD  insulin detemir (LEVEMIR FLEXTOUCH) 100 UNIT/ML FlexPen Inject 18 units into the skin once a day 08/19/21     insulin detemir (LEVEMIR FLEXTOUCH) 100 UNIT/ML FlexPen Inject 32-40 units into the skin once a day 11/03/21     insulin detemir (LEVEMIR) 100 UNIT/ML FlexPen Inject 32 - 40 units into the skin once daily as directed 01/17/22      insulin detemir (LEVEMIR) 100 UNIT/ML FlexPen Inject 22 units into the skin 2 times a day 02/08/22     Insulin Pen Needle (PIP PEN NEEDLES 32G X 4MM) 32G X 4 MM MISC Use twice daily. 02/08/22     Insulin Pen Needle (UNIFINE PENTIPS) 32G X 4 MM MISC Use as directed once a day with insulin 02/01/22     Insulin Pen Needle 32G X 4 MM MISC Use as directed once daily 01/27/22     Insulin Pen Needle 32G X 5 MM MISC Use as directed once a day 09/22/21     Lancets (FREESTYLE) lancets Use to check blood sugar twice a day. 08/15/21     levothyroxine (SYNTHROID) 75 MCG tablet Take 1 tablet by mouth every morning on an empty stomach 04/27/22     losartan (COZAAR) 25 MG tablet Take 1/2 tablet by mouth at bedtime. 07/22/21   Gean Quint, MD  losartan (COZAAR) 25 MG tablet Take 1 tablet (25 mg total) by mouth at bedtime. 11/14/21     losartan (COZAAR) 25 MG tablet Take 1 tablet by mouth once a day 02/08/22     metFORMIN (GLUCOPHAGE-XR) 500 MG 24 hr tablet Take 2 tablets by mouth with food Twice a day Patient not taking: Reported on 08/08/2021 01/25/21     methocarbamol (ROBAXIN) 750 MG tablet Take 1 tablet (750 mg total) by mouth every 4 (four) hours as  needed for tight muscles. 06/22/22     repaglinide (PRANDIN) 1 MG tablet Take 1/2 to 1 tablet by mouth 15 to 30 minutes before meals twice a day 02/08/22     rivaroxaban (XARELTO) 10 MG TABS tablet TAKE 1 TABLET BY MOUTH ONCE DAILY WITH FOOD. 05/22/22     glimepiride (AMARYL) 4 MG tablet Take 4 mg by mouth 2 (two) times daily.   06/05/20  [provider]      Allergies    Patient has no known allergies.    Review of Systems   Review of Systems  Physical Exam Updated Vital Signs BP (!) 133/95   Pulse (!) 125   Temp 98.3 F (36.8 C)   Resp 18   SpO2 94%   Physical Exam Vitals and nursing note reviewed.  Constitutional:      General: He is not in acute distress.    Appearance: He is well-developed.  HENT:     Head: Normocephalic and atraumatic.  Eyes:      General:        Right eye: No discharge.        Left eye: No discharge.     Conjunctiva/sclera: Conjunctivae normal.  Cardiovascular:     Rate and Rhythm: Regular rhythm. Tachycardia present.     Heart sounds: Normal heart sounds.  Pulmonary:     Effort: Pulmonary effort is normal.     Breath sounds: Decreased air movement present. Examination of the left-middle field reveals decreased breath sounds. Examination of the left-lower field reveals decreased breath sounds. Decreased breath sounds present. No wheezing or rhonchi.  Abdominal:     Palpations: Abdomen is soft.     Tenderness: There is no abdominal tenderness.  Musculoskeletal:     Cervical back: Normal range of motion and neck supple.  Skin:    General: Skin is warm and dry.  Neurological:     Mental Status: He is alert.     ED Results / Procedures / Treatments   Labs (all labs ordered are listed, but only abnormal results are displayed) Labs Reviewed  COMPREHENSIVE METABOLIC PANEL - Abnormal; Notable for the following components:      Result Value   Sodium 132 (*)    Glucose, Bld 243 (*)    BUN 37 (*)    Creatinine, Ser 2.05 (*)    Albumin 3.3 (*)    GFR, Estimated 35 (*)    All other components within normal limits  CBC WITH DIFFERENTIAL/PLATELET - Abnormal; Notable for the following components:   WBC 23.7 (*)    Hemoglobin 12.5 (*)    Platelets 679 (*)    Neutro Abs 21.8 (*)    Lymphs Abs 0.6 (*)    Abs Immature Granulocytes 0.27 (*)    All other components within normal limits  PROTIME-INR - Abnormal; Notable for the following components:   Prothrombin Time 17.0 (*)    INR 1.4 (*)    All other components within normal limits  APTT - Abnormal; Notable for the following components:   aPTT 47 (*)    All other components within normal limits  CULTURE, BLOOD (ROUTINE X 2)  CULTURE, BLOOD (ROUTINE X 2)  LACTIC ACID, PLASMA  LACTIC ACID, PLASMA  URINALYSIS, ROUTINE W REFLEX MICROSCOPIC    EKG EKG  Interpretation  Date/Time:  Wednesday July 05 2022 18:15:45 EDT Ventricular Rate:  114 PR Interval:  168 QRS Duration: 69 QT Interval:  285 QTC Calculation: 393 R Axis:  38 Text Interpretation: Sinus tachycardia Abnormal R-wave progression, early transition similar to prior no stemi Confirmed by Wynona Dove ([id number]) on 07/05/2022 7:15:05 PM  Radiology DG Chest 2 View  Result Date: 07/05/2022 CLINICAL DATA:  Cough, chest pain, shortness of breath EXAM: CHEST - 2 VIEW COMPARISON:  Previous studies including the examination of 06/18/2022 FINDINGS: There is interval opacification of left mid and left lower lung fields suggesting pneumonia and possibly pleural effusion. Part of the effusion appears to be loculated along the lateral chest wall. Possibility of empyema is not excluded. There are numerous small metallic densities in the medial right lower lung field, possibly suggesting previous aspiration of barium. This finding has not changed. IMPRESSION: There is interval appearance of large area of opacification in the left mid and left lower lung fields. Findings suggest pneumonia and large effusion. Part of the effusion appears to be loculated along the lateral chest wall. Possibility of empyema is not excluded. Follow-up CT may be considered. Electronically Signed   By: Elmer Picker M.D.   On: 07/05/2022 17:41    Procedures Procedures    Medications Ordered in ED Medications  cefTRIAXone (ROCEPHIN) 1 g in sodium chloride 0.9 % 100 mL IVPB (1 g Intravenous New Bag/Given 07/05/22 1811)  azithromycin (ZITHROMAX) 500 mg in sodium chloride 0.9 % 250 mL IVPB (has no administration in time range)    ED Course/ Medical Decision Making/ A&P    Patient seen and examined. History obtained directly from patient.    Labs/EKG: Sepsis work-up ordered.  Imaging: Personally reviewed and interpreted chest x-ray ordered on arrival, agree dense left-sided consolidation  Medications/Fluids:  Ordered: IV Rocephin and azithromycin  Most recent vital signs reviewed and are as follows: BP 111/74 (BP Location: Right Arm)   Pulse (!) 108   Temp 98.3 F (36.8 C)   Resp (!) 22   Ht 6\' 3"  (1.905 m)   Wt 91.4 kg   SpO2 98%   BMI 25.19 kg/m   Initial impression: Left-sided chest pain and airspace disease, eval for pleural effusion and empyema.    Reassessment performed. Patient appears comfortable, exam unchanged.  Heart rate slightly improved.  Labs personally reviewed and interpreted including: CBC with leukocytosis 23.7, predominant neutrophils 21.8, hemoglobin 12.5; CMP with creatinine 2.05 and GFR 35, normal liver function, glucose 243 with normal anion gap, sodium 132; lactic acid 0.6 normal; elevated PT/INR and APTT likely related to baseline anticoagulation.  Imaging personally visualized and interpreted including: CT of the chest, agree left-sided pneumonia, effusion with small amount of air.  Reviewed pertinent lab work and imaging with patient at bedside.  Agrees with admission.  Questions answered.   Plan: We will admit to the hospital.    Reassessment performed. Patient appears stable.  Reviewed pertinent lab work and imaging with patient at bedside. Questions answered.   Most current vital signs reviewed and are as follows: BP 102/66   Pulse (!) 103   Temp 98.7 F (37.1 C)   Resp (!) 27   Ht 6\' 3"  (1.905 m)   Wt 91.4 kg   SpO2 90%   BMI 25.19 kg/m   I consulted with Dr. Nevada Crane of [facility] hospitalist who accepts patient for admission.  Will be admitted to [facility] Making Amount and/or Complexity of Data Reviewed Labs: ordered. Radiology: ordered. ECG/medicine tests:  ordered.  Risk Prescription drug management. Decision regarding hospitalization.   Patient with shortness of breath, left-sided consolidation.  Suspect infectious etiology given symptoms and elevated white blood cell count.   Possible pleural effusion versus empyema.  No sign of pulmonary embolism or dissection.  Patient will require admission to the hospital for IV antibiotics and likely pulmonology consultation.        Final Clinical Impression(s) / ED Diagnoses Final diagnoses:  Community acquired pneumonia of left lung, unspecified part of lung  Pleural effusion    Rx / DC Orders ED Discharge Orders     None         Carlisle Cater, PA-C 07/05/22 Kistler A, DO 07/07/22 1008

## 2022-07-05 NOTE — H&P (Signed)
History and Physical    LOYAL HOLZHEIMER BMW:[id number] DOB: [birth year] DOA: 07/05/2022  PCP: [name], MD   Chief Complaint: cough, sob  HPI: [name] is a 66 y.o. male with medical history significant of squamous cell carcinoma of the tongue, type 2 diabetes on insulin, DVT, history of radiation, hyperlipidemia, hypothyroidism who presentedTo the emergency department at an outside facility due to cough and shortness of breath.  Patient states that his symptoms started after he received the flu shot 3 weeks ago.  He thought he pulled a muscle in his left chest wall and was given Robaxin.  He states his pain is just continued particularly with breathing.  His symptoms are mostly controlled at home however his shortness of breath and cough continued to worsen prompting presentation to the ER.  On arrival to the emergency department he was afebrile and hypoxic satting in the 80s.  He is also tachycardic in the 120s.  Labs were obtained which were notable for sodium 132, creatinine 2.05 (baseline 1.5-2.3) lactic acid 1.1, WBC 23.7, hemoglobin 12.5, platelets 679, blood cultures were drawn and are currently in process, INR 1.4, chest x-ray was obtained which showed large opacification in the left mid and left lower lung fields consistent with pneumonia and a pleural effusion.  CT PE study was ordered which demonstrated no evidence of pulmonary embolism however interval worsening with diffuse bronchial thickening of right lower and middle lobes concerning for atypical infection.  There is also moderate left-sided pleural effusion.  Mediastinal hilar lymphadenopathy was seen with recommended follow-up.  On evaluation patient was breathing comfortably on room air.  He endorses persistent cough and pleuritic chest pain.  He was started on ceftriaxone and azithromycin.  Review of Systems: ROS   As per HPI otherwise 10 point review of systems negative.   No Known Allergies  Past Medical  History:  Diagnosis Date   Alopecia    s/p chemotherapy   Arthritis    Bursitis    pt. 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 denies   Right kidney mass    Seborrheic keratosis    multiple on back    Past Surgical History:  Procedure Laterality Date   CARDIAC CATHETERIZATION     feeding tube placed  2009 and then removed   [place] Right 08/05/2020   Procedure: LYMPH NODE DISSECTION;  Surgeon: Raynelle Bring, MD;  Location: [facility];  Service: Urology;  Laterality: Right;   MOUTH SURGERY     PILONIDAL CYST / SINUS EXCISION  1974   port a cath placement     PORT-A-CATH REMOVAL  2010   right wrist fracture  Right 2004   ROBOT ASSISTED LAPAROSCOPIC NEPHRECTOMY Right 08/05/2020   Procedure: XI ROBOTIC ASSISTED LAPAROSCOPIC RADICAL NEPHRECTOMY/PARTIAL ADRENALECTOMY;  Surgeon: Raynelle Bring, MD;  Location: [facility];  Service: Urology;  Laterality: Right;   SHOULDER ARTHROSCOPY WITH DISTAL CLAVICLE RESECTION Right 10/26/2016   Procedure: SHOULDER ARTHROSCOPY WITH DISTAL CLAVICLE RESECTION;  Surgeon: Justice Britain, MD;  Location: [facility];  Service: Orthopedics;  Laterality: Right;   SHOULDER ARTHROSCOPY WITH ROTATOR CUFF REPAIR AND SUBACROMIAL DECOMPRESSION Left  07/16/2014   Procedure: LEFT SHOULDER ARTHROSCOPY WITH ROTATOR CUFF REPAIR AND SUBACROMIAL DECOMPRESSION/DISTAL CLAVICLE RESECTION;  Surgeon: Marin Shutter, MD;  Location: [facility];  Service: Orthopedics;  Laterality: Left;   SHOULDER ARTHROSCOPY WITH ROTATOR CUFF REPAIR AND SUBACROMIAL DECOMPRESSION Right 10/26/2016   Procedure: RIGHT SHOULDER ARTHROSCOPY WITH ROTATOR CUFF REPAIR AND SUBACROMIAL DECOMPRESSION;  Surgeon: Justice Britain, MD;  Location: [facility];  Service: Orthopedics;  Laterality: Right;  requests 2hrs   teeth extraction #9       reports that he has never smoked. He has never used smokeless tobacco. He reports current alcohol use. He reports that he does not use drugs.  Family History  Problem Relation Age of Onset   Cancer Mother        uterine   Diabetes Brother     Prior to Admission medications   Medication Sig Start Date End Date Taking? Authorizing Provider  clotrimazole-betamethasone (LOTRISONE) cream Apply to the affected area twice a day 11/09/21     Dulaglutide (TRULICITY) 1.5 DG/3.8VF SOPN Inject 1 pen under the skin once a week 02/08/22     ezetimibe-simvastatin (VYTORIN) 10-20 MG tablet Take 1 tablet by mouth once nightly. 11/28/21     ezetimibe-simvastatin (VYTORIN) 10-20 MG tablet Take 1 tablet by mouth once daily at night. 02/08/22     glucose blood (FREESTYLE LITE) test strip Use to test your blood sugar twice a day. 08/15/21     insulin detemir (LEVEMIR FLEXTOUCH) 100 UNIT/ML FlexPen Inject 22 Units into the skin daily. 08/05/21   Shelly Coss, MD  insulin detemir (LEVEMIR FLEXTOUCH) 100 UNIT/ML FlexPen Inject 18 units into the skin once a day 08/19/21     insulin detemir (LEVEMIR FLEXTOUCH) 100 UNIT/ML FlexPen Inject 32-40 units into the skin once a day 11/03/21     insulin detemir (LEVEMIR) 100 UNIT/ML FlexPen Inject 32 - 40 units into the skin once daily as directed 01/17/22     insulin detemir (LEVEMIR) 100 UNIT/ML FlexPen Inject 22 units into the skin 2 times a day  02/08/22     Insulin Pen Needle (PIP PEN NEEDLES 32G X 4MM) 32G X 4 MM MISC Use twice daily. 02/08/22     Insulin Pen Needle (UNIFINE PENTIPS) 32G X 4 MM MISC Use as directed once a day with insulin 02/01/22     Insulin Pen Needle 32G X 4 MM MISC Use as directed once daily 01/27/22     Insulin Pen Needle 32G X 5 MM MISC Use as directed once a day 09/22/21     Lancets (FREESTYLE) lancets Use to check blood sugar twice a day. 08/15/21     levothyroxine (SYNTHROID) 75 MCG tablet Take 1 tablet by mouth every morning on an empty stomach 04/27/22     losartan (COZAAR) 25 MG tablet Take 1/2 tablet by mouth at bedtime. 07/22/21   Gean Quint, MD  losartan (COZAAR) 25 MG tablet Take 1 tablet (25 mg total) by mouth at bedtime. 11/14/21     losartan (COZAAR) 25 MG  tablet Take 1 tablet by mouth once a day 02/08/22     metFORMIN (GLUCOPHAGE-XR) 500 MG 24 hr tablet Take 2 tablets by mouth with food Twice a day Patient not taking: Reported on 08/08/2021 01/25/21     methocarbamol (ROBAXIN) 750 MG tablet Take 1 tablet (750 mg total) by mouth every 4 (four) hours as needed for tight muscles. 06/22/22     repaglinide (PRANDIN) 1 MG tablet Take 1/2 to 1 tablet by mouth 15 to 30 minutes before meals twice a day 02/08/22     rivaroxaban (XARELTO) 10 MG TABS tablet TAKE 1 TABLET BY MOUTH ONCE DAILY WITH FOOD. 05/22/22     glimepiride (AMARYL) 4 MG tablet Take 4 mg by mouth 2 (two) times daily.   06/05/20  [provider]    Physical Exam: Vitals:   07/05/22 2100 07/05/22 2115 07/05/22 2130 07/05/22 2237  BP: 113/65 131/69 102/66 (!) 157/89  Pulse: (!) 106 (!) 105 (!) 103 100  Resp: (!) 29 13 (!) 27 18  Temp:    98.4 F (36.9 C)  TempSrc:    Oral  SpO2: (!) 89% 94% 90% 95%  Weight:      Height:       Physical Exam Vitals reviewed.  Constitutional:      Appearance: He is normal weight.  HENT:     Nose: Nose normal.     Mouth/Throat:     Mouth: Mucous membranes are moist.     Pharynx: Oropharynx is clear.   Eyes:     Pupils: Pupils are equal, round, and reactive to light.  Cardiovascular:     Rate and Rhythm: Regular rhythm. Tachycardia present.     Pulses: Normal pulses.     Heart sounds: Normal heart sounds.  Pulmonary:     Effort: Pulmonary effort is normal. No respiratory distress.     Breath sounds: Rhonchi and rales present.  Abdominal:     General: Abdomen is flat.     Palpations: Abdomen is soft.  Musculoskeletal:        General: Normal range of motion.  Skin:    General: Skin is warm.     Capillary Refill: Capillary refill takes less than 2 seconds.  Neurological:     General: No focal deficit present.     Mental Status: He is alert. Mental status is at baseline.       Labs on Admission: I have personally reviewed the patients's labs and imaging studies.  Assessment/Plan Principal Problem:   CAP (community acquired pneumonia) Active Problems:   Community acquired bacterial pneumonia   Acute hypoxic respiratory failure secondary to bacterial community-acquired pneumonia, POA, active - Patient has opacities on chest x-ray consistent with pneumonia as well as moderate pleural effusion.  Due to his history of malignancy and hypoxia would recommend thoracentesis  PLAN: Continue ceftriaxone, azithromycin Order thoracentesis, npo at 4am  T2D, poorly controlled -last a1c 12 PLAN: Basal bolus regimen with SSI  Lymphadenopehty on CT- OP followup Hypothyroidism- continue synthroid  Hx of SCC tongue- ctm Hx of DVT- off anticoag Admission status: Inpatient Telemetry Medical  Certification: The appropriate patient status for this patient is INPATIENT. Inpatient status is judged to be reasonable and necessary in order to provide the required intensity of service to ensure the patient's safety. The patient's presenting symptoms, physical exam findings, and initial radiographic and laboratory data in the context of their chronic comorbidities is felt to place them at high  risk for further clinical deterioration.  Furthermore, it is not anticipated that the patient will be medically stable for discharge from the hospital within 2 midnights of admission.   * I certify that at the point of admission it is my clinical judgment that the patient will require inpatient hospital care spanning beyond 2 midnights from the point of admission due to high intensity of service, high risk for further deterioration and high frequency of surveillance required.Emilee Hero MD [facility] If 7PM-7AM, please contact night-coverage [email or web address]  07/05/2022, 11:56 PM

## 2022-07-06 ENCOUNTER — Inpatient Hospital Stay (HOSPITAL_COMMUNITY): Payer: 59

## 2022-07-06 ENCOUNTER — Encounter (HOSPITAL_COMMUNITY)
Admission: EM | Disposition: A | Discharge status: Home / Self Care | Payer: Self-pay | Source: Home / Self Care | Attending: Internal Medicine

## 2022-07-06 ENCOUNTER — Encounter (HOSPITAL_COMMUNITY): Payer: Self-pay | Admitting: Internal Medicine

## 2022-07-06 DIAGNOSIS — J189 Pneumonia, unspecified organism: Secondary | ICD-10-CM | POA: Diagnosis not present

## 2022-07-06 DIAGNOSIS — J90 Pleural effusion, not elsewhere classified: Secondary | ICD-10-CM

## 2022-07-06 HISTORY — PX: CHEST TUBE INSERTION: SHX231

## 2022-07-06 LAB — URINALYSIS, ROUTINE W REFLEX MICROSCOPIC
Bacteria, UA: NONE SEEN
Bilirubin Urine: NEGATIVE
Glucose, UA: 150 mg/dL — AB
Hgb urine dipstick: NEGATIVE
Ketones, ur: 20 mg/dL — AB
Leukocytes,Ua: NEGATIVE
Nitrite: NEGATIVE
Protein, ur: 30 mg/dL — AB
Specific Gravity, Urine: 1.042 — ABNORMAL HIGH (ref 1.005–1.030)
pH: 5 (ref 5.0–8.0)

## 2022-07-06 LAB — BODY FLUID CELL COUNT WITH DIFFERENTIAL
Eos, Fluid: 0 %
Lymphs, Fluid: 9 %
Monocyte-Macrophage-Serous Fluid: 4 % — ABNORMAL LOW (ref 50–90)
Neutrophil Count, Fluid: 87 % — ABNORMAL HIGH (ref 0–25)
Total Nucleated Cell Count, Fluid: 4581 cu mm — ABNORMAL HIGH (ref 0–1000)

## 2022-07-06 LAB — BASIC METABOLIC PANEL
Anion gap: 11 (ref 5–15)
BUN: 34 mg/dL — ABNORMAL HIGH (ref 8–23)
CO2: 21 mmol/L — ABNORMAL LOW (ref 22–32)
Calcium: 8.5 mg/dL — ABNORMAL LOW (ref 8.9–10.3)
Chloride: 102 mmol/L (ref 98–111)
Creatinine, Ser: 1.93 mg/dL — ABNORMAL HIGH (ref 0.61–1.24)
GFR, Estimated: 37 mL/min — ABNORMAL LOW (ref >60)
Glucose, Bld: 223 mg/dL — ABNORMAL HIGH (ref 70–99)
Potassium: 5.2 mmol/L — ABNORMAL HIGH (ref 3.5–5.1)
Sodium: 134 mmol/L — ABNORMAL LOW (ref 135–145)

## 2022-07-06 LAB — CBC
HCT: 33.4 % — ABNORMAL LOW (ref 39.0–52.0)
Hemoglobin: 10.5 g/dL — ABNORMAL LOW (ref 13.0–17.0)
MCH: 28.5 pg (ref 26.0–34.0)
MCHC: 31.4 g/dL (ref 30.0–36.0)
MCV: 90.5 fL (ref 80.0–100.0)
Platelets: 545 10*3/uL — ABNORMAL HIGH (ref 150–400)
RBC: 3.69 MIL/uL — ABNORMAL LOW (ref 4.22–5.81)
RDW: 13.3 % (ref 11.5–15.5)
WBC: 20.8 10*3/uL — ABNORMAL HIGH (ref 4.0–10.5)
nRBC: 0 % (ref 0.0–0.2)

## 2022-07-06 LAB — GLUCOSE, CAPILLARY
Glucose-Capillary: 209 mg/dL — ABNORMAL HIGH (ref 70–99)
Glucose-Capillary: 167 mg/dL — ABNORMAL HIGH (ref 70–99)
Glucose-Capillary: 146 mg/dL — ABNORMAL HIGH (ref 70–99)
Glucose-Capillary: 221 mg/dL — ABNORMAL HIGH (ref 70–99)
Glucose-Capillary: 320 mg/dL — ABNORMAL HIGH (ref 70–99)

## 2022-07-06 LAB — GLUCOSE, PLEURAL OR PERITONEAL FLUID: Glucose, Fluid: 136 mg/dL

## 2022-07-06 LAB — HEMOGLOBIN A1C
Hgb A1c MFr Bld: 9.7 % — ABNORMAL HIGH (ref 4.8–5.6)
Mean Plasma Glucose: 231.69 mg/dL

## 2022-07-06 LAB — PROTEIN, PLEURAL OR PERITONEAL FLUID: Total protein, fluid: 4 g/dL

## 2022-07-06 LAB — HIV ANTIBODY (ROUTINE TESTING W REFLEX): HIV Screen 4th Generation wRfx: NONREACTIVE

## 2022-07-06 LAB — LACTATE DEHYDROGENASE, PLEURAL OR PERITONEAL FLUID: LD, Fluid: 234 U/L — ABNORMAL HIGH (ref 3–23)

## 2022-07-06 SURGERY — CHEST TUBE INSERTION
Anesthesia: Moderate Sedation

## 2022-07-06 MED ORDER — FENTANYL CITRATE (PF) 100 MCG/2ML IJ SOLN
INTRAMUSCULAR | Status: AC
  Filled 2022-07-06: qty 2

## 2022-07-06 MED ORDER — MIDAZOLAM HCL (PF) 5 MG/ML IJ SOLN
INTRAMUSCULAR | Status: DC | PRN
  Administered 2022-07-06: 2 mg via INTRAVENOUS

## 2022-07-06 MED ORDER — MIDAZOLAM HCL (PF) 5 MG/ML IJ SOLN
INTRAMUSCULAR | Status: AC
  Filled 2022-07-06: qty 1

## 2022-07-06 MED ORDER — INSULIN ASPART 100 UNIT/ML IJ SOLN
0.0000 [IU] | Freq: Three times a day (TID) | INTRAMUSCULAR | Status: DC
  Administered 2022-07-06 – 2022-07-07 (×2): 2 [IU] via SUBCUTANEOUS
  Administered 2022-07-07: 3 [IU] via SUBCUTANEOUS
  Administered 2022-07-08: 2 [IU] via SUBCUTANEOUS
  Administered 2022-07-08: 5 [IU] via SUBCUTANEOUS
  Administered 2022-07-08: 3 [IU] via SUBCUTANEOUS
  Administered 2022-07-09: 2 [IU] via SUBCUTANEOUS
  Administered 2022-07-09: 3 [IU] via SUBCUTANEOUS
  Administered 2022-07-09: 5 [IU] via SUBCUTANEOUS
  Administered 2022-07-10: 2 [IU] via SUBCUTANEOUS
  Administered 2022-07-10 (×2): 3 [IU] via SUBCUTANEOUS
  Administered 2022-07-11: 8 [IU] via SUBCUTANEOUS
  Administered 2022-07-11: 3 [IU] via SUBCUTANEOUS
  Administered 2022-07-12: 2 [IU] via SUBCUTANEOUS
  Administered 2022-07-12: 5 [IU] via SUBCUTANEOUS
  Administered 2022-07-12: 11 [IU] via SUBCUTANEOUS
  Administered 2022-07-13 (×2): 3 [IU] via SUBCUTANEOUS
  Administered 2022-07-13: 8 [IU] via SUBCUTANEOUS
  Administered 2022-07-14: 5 [IU] via SUBCUTANEOUS
  Administered 2022-07-14: 2 [IU] via SUBCUTANEOUS

## 2022-07-06 MED ORDER — INSULIN ASPART 100 UNIT/ML IJ SOLN
0.0000 [IU] | Freq: Every day | INTRAMUSCULAR | Status: DC
  Administered 2022-07-06: 2 [IU] via SUBCUTANEOUS
  Administered 2022-07-07: 3 [IU] via SUBCUTANEOUS
  Administered 2022-07-07: 4 [IU] via SUBCUTANEOUS
  Administered 2022-07-09 – 2022-07-12 (×2): 2 [IU] via SUBCUTANEOUS

## 2022-07-06 MED ORDER — SODIUM CHLORIDE 0.9% FLUSH
10.0000 mL | Freq: Three times a day (TID) | INTRAVENOUS | Status: DC
  Administered 2022-07-07 – 2022-07-09 (×5): 10 mL via INTRAPLEURAL

## 2022-07-06 MED ORDER — LIDOCAINE HCL 1 % IJ SOLN
INTRAMUSCULAR | Status: AC
  Filled 2022-07-06: qty 20

## 2022-07-06 MED ORDER — FENTANYL CITRATE (PF) 100 MCG/2ML IJ SOLN
INTRAMUSCULAR | Status: DC | PRN
  Administered 2022-07-06: 50 ug via INTRAVENOUS

## 2022-07-06 MED ORDER — INSULIN ASPART 100 UNIT/ML IJ SOLN
6.0000 [IU] | Freq: Three times a day (TID) | INTRAMUSCULAR | Status: DC
  Administered 2022-07-06 – 2022-07-10 (×9): 6 [IU] via SUBCUTANEOUS

## 2022-07-06 NOTE — Progress Notes (Signed)
  Transition of Care [facility]) Screening Note   Patient Details  Name: [name] Date of Birth: Mar 11, 1956   Transition of Care [facility]) CM/SW Contact:    Tom-Johnson, Renea Ee, RN Phone Number: 07/06/2022, 2:49 PM  Transition of Care Department [facility]) has reviewed patient and no TOC needs or recommendations have been identified at this time. TOC will continue to monitor patient advancement through interdisciplinary progression rounds. If new patient transition needs arise, please place a TOC consult.

## 2022-07-06 NOTE — H&P (View-Only) (Signed)
Referring Provider: Dr. Starla Link Primary Care Physician:  [name], MD Primary Gastroenterologist:  Althia Forts  Reason for Consultation:  Dysphagia  HPI: [name] is a 66 y.o. male with progressive solid food dysphagia for the past 6-8 months where throughout any solid meal he will have the sensation of food hanging up and needing to cough when he eats. Denies any trouble swallowing soft foods or liquids. Denies heartburn or abdominal pain.  Has unintentionally lost 10-12 pounds in the past 2 weeks. History of oropharyngeal/tongue cancer s/p chemo/radiation. S/P pleural catheter for a pleural effusion today. On Xarelto as outpt and none since admit on 07/05/22. Speech path eval today and report noted. Modified barium swallow in September showed oropharyngeal dysphagia with mild silent aspiration of liquids of any texture. Barium swallow attempted but aborted due to aspiration of thick barium.  Past Medical History:  Diagnosis Date   Alopecia    s/p chemotherapy   Arthritis    Bursitis    pt. 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,stg IV,HPV positive   Diabetes mellitus without complication ([place])    takes Amaryl,Januvia,and Metformin,daily   DVT, lower extremity ([place]) 2009   side effect from chemo and has blood clot left leg-wears compression stockings;was on Coumadin for 61months and has been off 5 yrs   History of chemotherapy    Cisplatin/Taxotere,5-FU   History of radiation therapy 01/28/08-03/11/08   left base tongue   History of shingles    Hyperlipidemia    takes Vytorin daily   Hypothyroidism    takes Synthroid daily   Joint pain    Joint swelling    Neuropathy    left foot   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 denies   Right kidney mass    Seborrheic keratosis    multiple on back    Past Surgical History:  Procedure Laterality Date   CARDIAC CATHETERIZATION      feeding tube placed  2009 and then removed   [place] Right 08/05/2020   Procedure: LYMPH NODE DISSECTION;  Surgeon: Raynelle Bring, MD;  Location: [facility];  Service: Urology;  Laterality: Right;   MOUTH SURGERY     PILONIDAL CYST / SINUS EXCISION  1974   port a cath placement     PORT-A-CATH REMOVAL  2010   right wrist fracture Right 2004   ROBOT ASSISTED LAPAROSCOPIC NEPHRECTOMY Right 08/05/2020   Procedure: XI ROBOTIC ASSISTED LAPAROSCOPIC RADICAL NEPHRECTOMY/PARTIAL ADRENALECTOMY;  Surgeon: Raynelle Bring, MD;  Location: [facility];  Service: Urology;  Laterality: Right;   SHOULDER ARTHROSCOPY WITH DISTAL CLAVICLE RESECTION Right 10/26/2016   Procedure: SHOULDER ARTHROSCOPY WITH DISTAL CLAVICLE RESECTION;  Surgeon: Justice Britain, MD;  Location: [facility];  Service: Orthopedics;  Laterality: Right;   SHOULDER ARTHROSCOPY WITH ROTATOR CUFF REPAIR AND SUBACROMIAL DECOMPRESSION Left 07/16/2014   Procedure: LEFT SHOULDER ARTHROSCOPY WITH ROTATOR CUFF REPAIR AND SUBACROMIAL DECOMPRESSION/DISTAL CLAVICLE RESECTION;  Surgeon: Marin Shutter, MD;  Location: [facility];  Service: Orthopedics;  Laterality: Left;   SHOULDER ARTHROSCOPY WITH ROTATOR CUFF REPAIR AND SUBACROMIAL DECOMPRESSION Right 10/26/2016   Procedure: RIGHT SHOULDER ARTHROSCOPY WITH ROTATOR CUFF REPAIR AND SUBACROMIAL DECOMPRESSION;  Surgeon: Justice Britain, MD;  Location: [facility];  Service: Orthopedics;  Laterality: Right;  requests 2hrs   teeth extraction #9      Prior to Admission medications  Medication Sig Start Date End Date Taking? Authorizing Provider  acetaminophen (TYLENOL) 500 MG tablet Take 1,000 mg by mouth daily as needed for mild pain or moderate pain.   Yes [provider]  Dulaglutide (TRULICITY) 1.5 OF/7.5ZW SOPN Inject 1 pen under the skin once a week Patient taking differently: Inject 1.5 mg into the skin every Sunday. 02/08/22  Yes   ezetimibe-simvastatin (VYTORIN) 10-20 MG tablet  Take 1 tablet by mouth once daily at night. 02/08/22  Yes   insulin detemir (LEVEMIR) 100 UNIT/ML FlexPen Inject 22 units into the skin 2 times a day Patient taking differently: Inject 22 Units into the skin 2 (two) times daily. 02/08/22  Yes   levothyroxine (SYNTHROID) 75 MCG tablet Take 1 tablet by mouth every morning on an empty stomach Patient taking differently: Take 75 mcg by mouth daily before breakfast. 04/27/22  Yes   losartan (COZAAR) 25 MG tablet Take 1 tablet by mouth once a day Patient taking differently: Take 25 mg by mouth at bedtime. 02/08/22  Yes   Multiple Vitamin (MULTIVITAMIN) tablet Take 1 tablet by mouth daily.   Yes [provider]  repaglinide (PRANDIN) 1 MG tablet Take 1/2 to 1 tablet by mouth 15 to 30 minutes before meals twice a day Patient taking differently: Take 1 mg by mouth 2 (two) times daily before a meal. 02/08/22  Yes   rivaroxaban (XARELTO) 10 MG TABS tablet TAKE 1 TABLET BY MOUTH ONCE DAILY WITH FOOD. Patient taking differently: Take 10 mg by mouth in the morning. 05/22/22  Yes   glucose blood (FREESTYLE LITE) test strip Use to test your blood sugar twice a day. 08/15/21     Insulin Pen Needle 32G X 4 MM MISC Use as directed once daily 01/27/22     Lancets (FREESTYLE) lancets Use to check blood sugar twice a day. 08/15/21     methocarbamol (ROBAXIN) 750 MG tablet Take 1 tablet (750 mg total) by mouth every 4 (four) hours as needed for tight muscles. Patient not taking: Reported on 07/06/2022 06/22/22     glimepiride (AMARYL) 4 MG tablet Take 4 mg by mouth 2 (two) times daily.   06/05/20  [provider]    Scheduled Meds:  [MAR Hold] azithromycin  500 mg Oral Daily   [MAR Hold] insulin aspart  0-15 Units Subcutaneous TID WC   [MAR Hold] insulin aspart  0-5 Units Subcutaneous QHS   [MAR Hold] insulin aspart  6 Units Subcutaneous TID WC   [MAR Hold] insulin detemir  18 Units Subcutaneous QHS   [MAR Hold] levothyroxine  75 mcg Oral q morning   sodium  chloride flush  10 mL Intrapleural Q8H   Continuous Infusions:  [MAR Hold] cefTRIAXone (ROCEPHIN)  IV 2 g (07/06/22 1104)   PRN Meds:.[MAR Hold] acetaminophen **OR** [MAR Hold] acetaminophen, [MAR Hold] ondansetron **OR** [MAR Hold] ondansetron (ZOFRAN) IV, [MAR Hold] oxyCODONE  Allergies as of 07/05/2022   (No Known Allergies)    Family History  Problem Relation Age of Onset   Cancer Mother        uterine   Diabetes Brother     Social History   Socioeconomic History   Marital status: Married    Spouse name: Not on file   Number of children: Not on file   Years of education: Not on file   Highest education level: Not on file  Occupational History   Not on file  Tobacco Use   Smoking status: Never   Smokeless tobacco:  Never  Vaping Use   Vaping Use: Never used  Substance and Sexual Activity   Alcohol use: Yes    Comment: occasional drink    Drug use: No   Sexual activity: Yes  Other Topics Concern   Not on file  Social History Narrative   Not on file   Social Determinants of Health   Financial Resource Strain: Not on file  Food Insecurity: Not on file  Transportation Needs: Not on file  Physical Activity: Not on file  Stress: Not on file  Social Connections: Not on file  Intimate Partner Violence: Not on file    Review of Systems: All negative except as stated above in HPI.  Physical Exam: Vital signs: Vitals:   07/06/22 1615 07/06/22 1625  BP: (!) 154/70 (!) 154/74  Pulse: (!) 104 (!) 106  Resp: (!) 26 (!) 34  Temp:    SpO2: 96% 94%  T 97.5  Last BM Date : 07/04/22 General:   Elderly, thin, lethargic, no acute distress  Head: normocephalic, atraumatic Eyes: anicteric sclera Neck: supple, nontender Lungs:  Clear throughout to auscultation.   No wheezes, crackles, or rhonchi. No acute distress. Left-sided pleural catheter noted Heart:  Regular rate and rhythm; no murmurs, clicks, rubs,  or gallops. Abdomen: soft, nontender, nondistended, +BS   Rectal:  Deferred Ext: no edema  GI:  Lab Results: Recent Labs    07/05/22 1808 07/06/22 0157  WBC 23.7* 20.8*  HGB 12.5* 10.5*  HCT 39.8 33.4*  PLT 679* 545*   BMET Recent Labs    07/05/22 1808 07/06/22 0157  NA 132* 134*  K 4.8 5.2*  CL 98 102  CO2 23 21*  GLUCOSE 243* 223*  BUN 37* 34*  CREATININE 2.05* 1.93*  CALCIUM 9.4 8.5*   LFT Recent Labs    07/05/22 1808  PROT 7.6  ALBUMIN 3.3*  AST 28  ALT 23  ALKPHOS 101  BILITOT 0.5   PT/INR Recent Labs    07/05/22 1808  LABPROT 17.0*  INR 1.4*     Studies/Results: DG CHEST PORT 1 VIEW  Result Date: 07/06/2022 CLINICAL DATA:  Pleural effusion, chest tube placement EXAM: PORTABLE CHEST 1 VIEW COMPARISON:  Previous studies including the examination of 07/05/2022 FINDINGS: There is interval placement of left chest tube There is no significant change in large left pleural effusion and infiltrate in left lung. There is interval increase in infiltrate in left apical region. No infiltrates are seen in right lung. There is no evidence of apical pneumothorax. IMPRESSION: There is interval placement of left chest tube. There is large loculated left pleural effusion with no significant change. There is possible increase in infiltrate in the left apical region suggesting worsening of pneumonia. Electronically Signed   By: Elmer Picker M.D.   On: 07/06/2022 16:13   CT Angio Chest PE W and/or Wo Contrast  Result Date: 07/05/2022 CLINICAL DATA:  Pulmonary embolism (PE) suspected, high prob. Cough, fever and chest congestion. Seen by [employer]. Suggested eval for pneumonia. Had "pulled chest muscle" around 06/16/22, hx oc throat CA 2009, rt kidney CA 2021 with chemo/radiation. Pt not a smoker. EXAM: CT ANGIOGRAPHY CHEST WITH CONTRAST TECHNIQUE: Multidetector CT imaging of the chest was performed using the standard protocol during bolus administration of intravenous contrast. Multiplanar CT image reconstructions and MIPs  were obtained to evaluate the vascular anatomy. RADIATION DOSE REDUCTION: This exam was performed according to the departmental dose-optimization program which includes automated exposure control, adjustment of the mA  and/or kV according to patient size and/or use of iterative reconstruction technique. CONTRAST:  37mL OMNIPAQUE IOHEXOL 350 MG/ML SOLN COMPARISON:  CT chest 06/02/2022 FINDINGS: Cardiovascular: Satisfactory opacification of the pulmonary arteries to the segmental level. No evidence of pulmonary embolism. Normal heart size. No significant pericardial effusion. The thoracic aorta is normal in caliber. No atherosclerotic plaque of the thoracic aorta. Three-vessel coronary artery calcifications. Mediastinum/Nodes: Mediastinal lymphadenopathy: As an example a subcarinal 1.7 cm lymph node (5:121) and a pre-vascular 1 cm lymph node (5:99). Borderline enlarged bilateral hilar lymph nodes: 1 cm (5:109) and 1 cm (5:123). No enlarged axillary lymph nodes. Thyroid gland, trachea, and esophagus demonstrate no significant findings. Lungs/Pleura: Interval worsening of diffuse bronchial wall thickening within bilateral lower lobe. Similar-appearing right lower lobe metallic densities that may represent previously aspirated material. No focal consolidation. Interval development of right lower lobe and to lesser extent right middle lobe tree-in-bud nodularity. No pulmonary mass. Interval development of an at least moderate volume left pleural effusion with associated several foci of pleural gas. No pneumothorax. Upper Abdomen: No acute abnormality. Musculoskeletal: No chest wall abnormality. No suspicious lytic or blastic osseous lesions. No acute displaced fracture. Multilevel degenerative changes of the spine. Review of the MIP images confirms the above findings. IMPRESSION: 1. No pulmonary embolus. 2. Interval worsening of diffuse bronchial wall thickening with right lower lobe and right middle lobe development of  atypical infection. 3. Interval development of an at least moderate volume left hydropneumothorax. 4. Mediastinal and hilar lymphadenopathy. Recommend attention on follow-up. Electronically Signed   By: Iven Finn M.D.   On: 07/05/2022 19:29   DG Chest 2 View  Result Date: 07/05/2022 CLINICAL DATA:  Cough, chest pain, shortness of breath EXAM: CHEST - 2 VIEW COMPARISON:  Previous studies including the examination of 06/18/2022 FINDINGS: There is interval opacification of left mid and left lower lung fields suggesting pneumonia and possibly pleural effusion. Part of the effusion appears to be loculated along the lateral chest wall. Possibility of empyema is not excluded. There are numerous small metallic densities in the medial right lower lung field, possibly suggesting previous aspiration of barium. This finding has not changed. IMPRESSION: There is interval appearance of large area of opacification in the left mid and left lower lung fields. Findings suggest pneumonia and large effusion. Part of the effusion appears to be loculated along the lateral chest wall. Possibility of empyema is not excluded. Follow-up CT may be considered. Electronically Signed   By: Elmer Picker M.D.   On: 07/05/2022 17:41    Impression/Plan: Progressive solid food dysphagia with aspiration on MBS and BSw. Patient feels stasis with PO intake per speech path report and wants to eat regular foods. EGD with possible dilation scheduled for tomorrow to be done by Dr. Alessandra Bevels. Regular diet with aspiration precautions. NPO p MN. Supportive care.    LOS: 1 day   Lear Ng  07/06/2022, 4:47 PM  Questions please call [phone]

## 2022-07-06 NOTE — Progress Notes (Signed)
Patient arrived to unit in NAD. Chest tube reconnected to suction per MD order. Patient on 2L [place]. Wife and belongings at bedside.

## 2022-07-06 NOTE — Consult Note (Addendum)
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  Renal cell carcinoma with right nephrectomy 2021 per Dr. Alinda Money.  Ongoing evaluation for dysphagia with barium swallow indicative of chronic aspiration and narrowing of of the esophagus.  Suspect there is a component of aspiration.  Pulmonary critical care asked to evaluate for left effusion and suspected left empyema.  Agree with interim empirical antimicrobial therapy.  Pertinent  Medical History   Past Medical History:  Diagnosis Date   Alopecia    s/p chemotherapy   Arthritis    Bursitis    pt. denies   Cancer ([place]) 10/2007    tongue/squamous cell,stg IV,HPV positive   Diabetes mellitus without complication ([place])    takes Amaryl,Januvia,and Metformin,daily   DVT, lower extremity ([place]) 2009   side effect from chemo and has blood clot left leg-wears compression stockings;was on Coumadin for 64months and has been off 5 yrs   History of chemotherapy    Cisplatin/Taxotere,5-FU   History of radiation therapy 01/28/08-03/11/08   left base tongue   History of shingles    Hyperlipidemia    takes Vytorin daily   Hypothyroidism    takes Synthroid daily   Joint pain    Joint swelling    Neuropathy    left foot   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 denies   Right kidney mass    Seborrheic keratosis    multiple on back     Significant Hospital Events: Including procedures, antibiotic start and stop dates in addition to other pertinent events     Interim  History / Subjective:  I CT scan suggestive of empyema pleural effusion  Objective   Blood pressure 113/84, pulse (!) 107, temperature 99.7 F (37.6 C), temperature source Oral, resp. rate (!) 22, height 6\' 3"  (1.905 m), weight 91.4 kg, SpO2 95 %.        Intake/Output Summary (Last 24 hours) at 07/06/2022 0944 Last data filed at 07/06/2022 0844 Gross per 24 hour  Intake 1223 ml  Output 0 ml  Net 1223 ml   Filed Weights   07/05/22 1814  Weight: 91.4 kg    Examination: General: Thin male no acute distress HENT: Oropharynx is unremarkable, no lymphadenopathy is appreciated.  Audible retained fluids and throat  Lungs: Coarse rhonchi left greater than right Cardiovascular: Heart sounds are regular Abdomen: Soft positive bowel sounds Extremities: Areas of injury secondary to scratching and healing Neuro: Grossly intact GU: Voids  Resolved Hospital Problem list     Assessment & Plan:  Left-sided pleural effusion suspected empyema in the setting of patient who said squamous cell oral cancer along with renal cell carcinoma with right nephrectomy 2021. Presents with increasing left-sided chest pain following COVID shot on 06/16/2022.  Follow-up work-up demonstrated left effusion and empyema.  He recently had work-up for dysphagia with barium swallow very suggestive of aspiration.  Pulmonary critical care asked to evaluate for pleural effusion left and suspected empyema Suspect there is a component of aspiration Repeat barium swallow N.p.o. Note he had squamous cell carcinoma of the  oropharynx seen by Dr. Redmond Baseman in 2009 was treated with chemo and radiation Consider ENT evaluation Consider GI consult questionable esophageal stricture Antimicrobial therapy for suspected aspiration Either pigtail catheter or chest tube placement to drain pleural effusion/empyema further evaluation per MD  Consider further oncological work-up for other areas of malignancies  All other issues per  primary  Best Practice (right click and "Reselect all SmartList Selections" daily)   Diet/type: NPO DVT prophylaxis: not indicated GI prophylaxis: PPI Lines: N/A Foley:  N/A Code Status:  full code Last date of multidisciplinary goals of care discussion tbd  Labs   CBC: Recent Labs  Lab 07/05/22 1808 07/06/22 0157  WBC 23.7* 20.8*  NEUTROABS 21.8*  --   HGB 12.5* 10.5*  HCT 39.8 33.4*  MCV 89.2 90.5  PLT 679* 545*    Basic Metabolic Panel: Recent Labs  Lab 07/05/22 1808 07/06/22 0157  NA 132* 134*  K 4.8 5.2*  CL 98 102  CO2 23 21*  GLUCOSE 243* 223*  BUN 37* 34*  CREATININE 2.05* 1.93*  CALCIUM 9.4 8.5*   GFR: Estimated Creatinine Clearance: 43.8 mL/min (A) (by C-G formula based on SCr of 1.93 mg/dL (H)). Recent Labs  Lab 07/05/22 1808 07/05/22 1945 07/06/22 0157  WBC 23.7*  --  20.8*  LATICACIDVEN 1.1 0.6  --     Liver Function Tests: Recent Labs  Lab 07/05/22 1808  AST 28  ALT 23  ALKPHOS 101  BILITOT 0.5  PROT 7.6  ALBUMIN 3.3*   No results for input(s): "LIPASE", "AMYLASE" in the last 168 hours. No results for input(s): "AMMONIA" in the last 168 hours.  ABG    Component Value Date/Time   HCO3 17.9 (L) 08/03/2021 1926   TCO2 14 (L) 08/03/2021 1400   ACIDBASEDEF 9.6 (H) 08/03/2021 1926   O2SAT 24.9 08/03/2021 1926     Coagulation Profile: Recent Labs  Lab 07/05/22 1808  INR 1.4*    Cardiac Enzymes: No results for input(s): "CKTOTAL", "CKMB", "CKMBINDEX", "TROPONINI" in the last 168 hours.  HbA1C: Hgb A1c MFr Bld  Date/Time Value Ref Range Status  07/06/2022 01:57 AM 9.7 (H) 4.8 - 5.6 % Final    Comment:    (NOTE) Pre diabetes:          5.7%-6.4%  Diabetes:              >6.4%  Glycemic control for   <7.0% adults with diabetes   08/03/2021 09:03 PM 12.2 (H) 4.8 - 5.6 % Final    Comment:    (NOTE)         Prediabetes: 5.7 - 6.4         Diabetes: >6.4         Glycemic control for adults with diabetes: <7.0      CBG: Recent Labs  Lab 07/06/22 0101 07/06/22 0736  GLUCAP 209* 167*    Review of Systems:   10 point review of system taken, please see HPI for positives and negatives. Positive for cough Positive for cough while eating Choking while eating Left-sided chest pain  Past Medical History:  He,  has a past medical history of Alopecia, Arthritis, Bursitis, Cancer ([place]) (10/2007), Diabetes mellitus without complication ([place]), DVT, lower extremity ([place]) (2009), History of chemotherapy, History of radiation therapy (01/28/08-03/11/08), History of shingles, Hyperlipidemia, Hypothyroidism, Joint pain, Joint swelling, Neuropathy, Osteoradionecrosis of mandible (11/2010), PE (pulmonary embolism) (12/07/2014), Pneumonia, Right kidney mass, and Seborrheic keratosis.   Surgical History:   Past Surgical History:  Procedure Laterality Date   CARDIAC CATHETERIZATION     feeding tube placed  2009 and then removed   [place] Right 08/05/2020   Procedure: LYMPH NODE DISSECTION;  Surgeon: Raynelle Bring, MD;  Location: [facility];  Service: Urology;  Laterality: Right;   MOUTH SURGERY     PILONIDAL CYST / SINUS EXCISION  1974   port a cath placement     PORT-A-CATH REMOVAL  2010   right wrist fracture Right 2004   ROBOT ASSISTED LAPAROSCOPIC NEPHRECTOMY Right 08/05/2020   Procedure: XI ROBOTIC ASSISTED LAPAROSCOPIC RADICAL NEPHRECTOMY/PARTIAL ADRENALECTOMY;  Surgeon: Raynelle Bring, MD;  Location: [facility];  Service: Urology;  Laterality: Right;   SHOULDER ARTHROSCOPY WITH DISTAL CLAVICLE RESECTION Right 10/26/2016   Procedure: SHOULDER ARTHROSCOPY WITH DISTAL CLAVICLE RESECTION;  Surgeon: Justice Britain, MD;  Location: [facility];  Service: Orthopedics;  Laterality: Right;   SHOULDER ARTHROSCOPY WITH ROTATOR CUFF REPAIR AND SUBACROMIAL DECOMPRESSION Left 07/16/2014   Procedure: LEFT SHOULDER ARTHROSCOPY WITH ROTATOR CUFF REPAIR AND SUBACROMIAL DECOMPRESSION/DISTAL CLAVICLE  RESECTION;  Surgeon: Marin Shutter, MD;  Location: [facility];  Service: Orthopedics;  Laterality: Left;   SHOULDER ARTHROSCOPY WITH ROTATOR CUFF REPAIR AND SUBACROMIAL DECOMPRESSION Right 10/26/2016   Procedure: RIGHT SHOULDER ARTHROSCOPY WITH ROTATOR CUFF REPAIR AND SUBACROMIAL DECOMPRESSION;  Surgeon: Justice Britain, MD;  Location: [facility];  Service: Orthopedics;  Laterality: Right;  requests 2hrs   teeth extraction #9       Social History:   reports that he has never smoked. He has never used smokeless tobacco. He reports current alcohol use. He reports that he does not use drugs.   Family History:  His family history includes Cancer in his mother; Diabetes in his brother.   Allergies No Known Allergies   Home Medications  Prior to Admission medications   Medication Sig Start Date End Date Taking? Authorizing Provider  clotrimazole-betamethasone (LOTRISONE) cream Apply to the affected area twice a day 11/09/21     Dulaglutide (TRULICITY) 1.5 FO/2.7XA SOPN Inject 1 pen under the skin once a week 02/08/22     ezetimibe-simvastatin (VYTORIN) 10-20 MG tablet Take 1 tablet by mouth once nightly. 11/28/21     ezetimibe-simvastatin (VYTORIN) 10-20 MG tablet Take 1 tablet by mouth once daily at night. 02/08/22     glucose blood (FREESTYLE LITE) test strip Use to test your blood sugar twice a day. 08/15/21     insulin detemir (LEVEMIR FLEXTOUCH) 100 UNIT/ML FlexPen Inject 22 Units into the skin daily. 08/05/21   Shelly Coss, MD  insulin detemir (LEVEMIR FLEXTOUCH) 100 UNIT/ML FlexPen Inject 18 units into the skin once a day 08/19/21     insulin detemir (LEVEMIR FLEXTOUCH) 100 UNIT/ML FlexPen Inject 32-40 units into the skin once a day 11/03/21     insulin detemir (LEVEMIR) 100 UNIT/ML FlexPen Inject 32 - 40 units into the skin once daily as directed 01/17/22     insulin detemir (LEVEMIR) 100 UNIT/ML FlexPen Inject 22 units into the skin 2 times a day 02/08/22     Insulin Pen Needle (PIP PEN NEEDLES 32G X 4MM)  32G X 4 MM MISC Use twice daily. 02/08/22     Insulin Pen Needle (UNIFINE PENTIPS) 32G X 4 MM MISC Use as directed once a day with insulin 02/01/22     Insulin Pen Needle 32G X 4 MM MISC Use as directed once daily 01/27/22     Insulin Pen Needle 32G X 5 MM MISC Use  as directed once a day 09/22/21     Lancets (FREESTYLE) lancets Use to check blood sugar twice a day. 08/15/21     levothyroxine (SYNTHROID) 75 MCG tablet Take 1 tablet by mouth every morning on an empty stomach 04/27/22     losartan (COZAAR) 25 MG tablet Take 1/2 tablet by mouth at bedtime. 07/22/21   Gean Quint, MD  losartan (COZAAR) 25 MG tablet Take 1 tablet (25 mg total) by mouth at bedtime. 11/14/21     losartan (COZAAR) 25 MG tablet Take 1 tablet by mouth once a day 02/08/22     metFORMIN (GLUCOPHAGE-XR) 500 MG 24 hr tablet Take 2 tablets by mouth with food Twice a day Patient not taking: Reported on 08/08/2021 01/25/21     methocarbamol (ROBAXIN) 750 MG tablet Take 1 tablet (750 mg total) by mouth every 4 (four) hours as needed for tight muscles. 06/22/22     repaglinide (PRANDIN) 1 MG tablet Take 1/2 to 1 tablet by mouth 15 to 30 minutes before meals twice a day 02/08/22     rivaroxaban (XARELTO) 10 MG TABS tablet TAKE 1 TABLET BY MOUTH ONCE DAILY WITH FOOD. 05/22/22     glimepiride (AMARYL) 4 MG tablet Take 4 mg by mouth 2 (two) times daily.   06/05/20  [provider]     Critical care time: Ferol Luz Haani Bakula ACNP Acute Care Nurse Practitioner [place] Please consult Amion 07/06/2022, 9:44 AM

## 2022-07-06 NOTE — Progress Notes (Signed)
Pt off unit

## 2022-07-06 NOTE — Progress Notes (Signed)
PROGRESS NOTE    [name]  AJG:[id number] DOB: 02-09-1956 DOA: 07/05/2022 PCP: [name], MD   Brief Narrative:  66 y.o. 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  On presentation, he was hypoxic with oxygen saturation in the 80s on room air along with tachycardia with creatinine of 2.05, WBC of 23.7.  Chest x-ray showed left-sided pneumonia with large effusion with possibility of empyema.  CTA of the chest showed no pulm embolism but showed possible right lower lobe and right middle lobe atypical infection along with moderate left hydropneumothorax.  Assessment & Plan:   Acute respiratory failure with hypoxia Bilateral community-acquired pneumonia with possibility of aspiration pneumonia as well along with concerns for left-sided empyema Possible left-sided empyema versus hydropneumothorax -Imaging as above.  Currently on Rocephin and Zithromax. -I have canceled IR consult request and have spoken to [facility].  Follow pulmonary recommendations.  Patient might need a chest tube/pigtail catheter. -Currently on room air. -Follow cultures  Leukocytosis -Improving.  Monitor  Ongoing dysphagia History of squamous cell carcinoma of tongue status post chemotherapy and radiation -Patient has had ongoing work-up for dysphagia with outpatient barium swallow indicating chronic aspiration and narrowing of esophagus. -SLP evaluation.  Patient/family requesting to see Dr. Johnnette Gourd.  We will consult ENT.  Chronic kidney disease stage IIIb -Creatinine currently stable.  Monitor.  Hyponatremia -Mild.   Monitor  Hypokalemia -Mild.  Repeat a.m. labs  Thrombocytosis -Possibly reactive.  Monitor  Diabetes mellitus type 2 with hyperglycemia -A1c 9.7.  Continue CBGs with SSI.  Continue long-acting insulin  Hypothyroidism -Continue levothyroxine   DVT prophylaxis: SCDs Code Status: Full Family Communication: Wife at bedside Disposition Plan: Status is: Inpatient Remains inpatient appropriate because: Of severity of  illness   Consultants: Pulmonary  Procedures: None  Antimicrobials: Rocephin and Zithromax from 07/05/2022 onwards   Subjective: Patient seen and examined at bedside.  Complains of some cough, shortness of breath and difficulty swallowing along with some chest tightness.  No fever, vomiting reported.  Objective: Vitals:   07/06/22 0101 07/06/22 0233 07/06/22 0551 07/06/22 0818  BP: (!) 141/74 98/67 127/79 113/84  Pulse: (!) 112 (!) 106 (!) 106 (!) 107  Resp: 18 20 20  (!) 22  Temp: 98.5 F (36.9 C) 98.9 F (37.2 C) 98.3 F (36.8 C) 99.7 F (37.6 C)  TempSrc:  Oral Oral Oral  SpO2: 93% 94% 93% 95%  Weight:      Height:        Intake/Output Summary (Last 24 hours) at 07/06/2022 1053 Last data filed at 07/06/2022 0844 Gross per 24 hour  Intake 1223 ml  Output 0 ml  Net 1223 ml   Filed Weights   07/05/22 1814  Weight: 91.4 kg    Examination:  General exam: Appears calm and comfortable.  Currently on room air. Respiratory system: Bilateral decreased breath sounds at bases with scattered crackles Cardiovascular system: S1 & S2 heard, tachycardic Gastrointestinal system: Abdomen is nondistended, soft and nontender. Normal bowel sounds heard. Extremities: No cyanosis, clubbing, edema  Central nervous system: Alert and oriented. No focal neurological deficits. Moving extremities Skin: No rashes, lesions or ulcers Psychiatry: Judgement and insight appear normal. Mood & affect appropriate.  Data Reviewed: I have personally reviewed following labs  and imaging studies  CBC: Recent Labs  Lab 07/05/22 1808 07/06/22 0157  WBC 23.7* 20.8*  NEUTROABS 21.8*  --   HGB 12.5* 10.5*  HCT 39.8 33.4*  MCV 89.2 90.5  PLT 679* 096*   Basic Metabolic Panel: Recent Labs  Lab 07/05/22 1808 07/06/22 0157  NA 132* 134*  K 4.8 5.2*  CL 98 102  CO2 23 21*  GLUCOSE 243* 223*  BUN 37* 34*  CREATININE 2.05* 1.93*  CALCIUM 9.4 8.5*   GFR: Estimated Creatinine Clearance: 43.8 mL/min (A) (by C-G formula based on SCr of 1.93 mg/dL (H)). Liver Function Tests: Recent Labs  Lab 07/05/22 1808  AST 28  ALT 23  ALKPHOS 101  BILITOT 0.5  PROT 7.6  ALBUMIN 3.3*   No results for input(s): "LIPASE", "AMYLASE" in the last 168 hours. No results for input(s): "AMMONIA" in the last 168 hours. Coagulation Profile: Recent Labs  Lab 07/05/22 1808  INR 1.4*   Cardiac Enzymes: No results for input(s): "CKTOTAL", "CKMB", "CKMBINDEX", "TROPONINI" in the last 168 hours. BNP (last 3 results) No results for input(s): "PROBNP" in the last 8760 hours. HbA1C: Recent Labs    07/06/22 0157  HGBA1C 9.7*   CBG: Recent Labs  Lab 07/06/22 0101 07/06/22 0736  GLUCAP 209* 167*   Lipid Profile: No results for input(s): "CHOL", "HDL", "LDLCALC", "TRIG", "CHOLHDL", "LDLDIRECT" in the last 72 hours. Thyroid Function Tests: No results for input(s): "TSH", "T4TOTAL", "FREET4", "T3FREE", "THYROIDAB" in the last 72 hours. Anemia Panel: No results for input(s): "VITAMINB12", "FOLATE", "FERRITIN", "TIBC", "IRON", "RETICCTPCT" in the last 72 hours. Sepsis Labs: Recent Labs  Lab 07/05/22 1808 07/05/22 1945  LATICACIDVEN 1.1 0.6    Recent Results (from the past 240 hour(s))  Blood Culture (routine x 2)     Status: None (Preliminary result)   Collection Time: 07/05/22  6:08 PM   Specimen: BLOOD  Result Value Ref Range Status   Specimen Description   Final    BLOOD LEFT ANTECUBITAL Performed at [facility], [address]  [place], [place] [address]    Special Requests   Final    Blood Culture adequate volume BOTTLES DRAWN AEROBIC AND ANAEROBIC Performed at [facility], [address] [place], [place] [address]    Culture   Final    NO GROWTH < 12 HOURS Performed at [facility] [address] [place], [place] [address]    Report Status PENDING  Incomplete  Blood Culture (routine x 2)     Status: None (Preliminary result)   Collection Time: 07/05/22  6:08 PM   Specimen: BLOOD  Result Value Ref Range Status   Specimen Description   Final    BLOOD BLOOD RIGHT FOREARM Performed at [facility], [address], [place], [place] [address]    Special Requests   Final    Blood Culture adequate volume BOTTLES DRAWN AEROBIC AND ANAEROBIC Performed at [facility], [address], [place], [place] [address]    Culture   Final    NO GROWTH < 12 HOURS Performed at [facility], [place] [address], [place], [place] [address]    Report Status PENDING  Incomplete         Radiology Studies: CT Angio Chest PE W and/or Wo Contrast  Result Date: 07/05/2022 CLINICAL DATA:  Pulmonary embolism (PE) suspected, high prob. Cough, fever and chest congestion. Seen by [employer]. Suggested  eval for pneumonia. Had "pulled chest muscle" around 06/16/22, hx oc throat CA 2009, rt kidney CA 2021 with chemo/radiation. Pt not a smoker. EXAM: CT ANGIOGRAPHY CHEST WITH CONTRAST TECHNIQUE: Multidetector CT imaging of the chest was performed using the standard protocol during bolus administration of intravenous contrast. Multiplanar CT image reconstructions and MIPs were obtained to evaluate the vascular anatomy. RADIATION DOSE REDUCTION: This exam was performed according to the departmental dose-optimization program which includes automated exposure control, adjustment of the mA and/or kV according to patient size and/or use of iterative reconstruction technique.  CONTRAST:  37mL OMNIPAQUE IOHEXOL 350 MG/ML SOLN COMPARISON:  CT chest 06/02/2022 FINDINGS: Cardiovascular: Satisfactory opacification of the pulmonary arteries to the segmental level. No evidence of pulmonary embolism. Normal heart size. No significant pericardial effusion. The thoracic aorta is normal in caliber. No atherosclerotic plaque of the thoracic aorta. Three-vessel coronary artery calcifications. Mediastinum/Nodes: Mediastinal lymphadenopathy: As an example a subcarinal 1.7 cm lymph node (5:121) and a pre-vascular 1 cm lymph node (5:99). Borderline enlarged bilateral hilar lymph nodes: 1 cm (5:109) and 1 cm (5:123). No enlarged axillary lymph nodes. Thyroid gland, trachea, and esophagus demonstrate no significant findings. Lungs/Pleura: Interval worsening of diffuse bronchial wall thickening within bilateral lower lobe. Similar-appearing right lower lobe metallic densities that may represent previously aspirated material. No focal consolidation. Interval development of right lower lobe and to lesser extent right middle lobe tree-in-bud nodularity. No pulmonary mass. Interval development of an at least moderate volume left pleural effusion with associated several foci of pleural gas. No pneumothorax. Upper Abdomen: No acute abnormality. Musculoskeletal: No chest wall abnormality. No suspicious lytic or blastic osseous lesions. No acute displaced fracture. Multilevel degenerative changes of the spine. Review of the MIP images confirms the above findings. IMPRESSION: 1. No pulmonary embolus. 2. Interval worsening of diffuse bronchial wall thickening with right lower lobe and right middle lobe development of atypical infection. 3. Interval development of an at least moderate volume left hydropneumothorax. 4. Mediastinal and hilar lymphadenopathy. Recommend attention on follow-up. Electronically Signed   By: Iven Finn M.D.   On: 07/05/2022 19:29   DG Chest 2 View  Result Date: 07/05/2022 CLINICAL  DATA:  Cough, chest pain, shortness of breath EXAM: CHEST - 2 VIEW COMPARISON:  Previous studies including the examination of 06/18/2022 FINDINGS: There is interval opacification of left mid and left lower lung fields suggesting pneumonia and possibly pleural effusion. Part of the effusion appears to be loculated along the lateral chest wall. Possibility of empyema is not excluded. There are numerous small metallic densities in the medial right lower lung field, possibly suggesting previous aspiration of barium. This finding has not changed. IMPRESSION: There is interval appearance of large area of opacification in the left mid and left lower lung fields. Findings suggest pneumonia and large effusion. Part of the effusion appears to be loculated along the lateral chest wall. Possibility of empyema is not excluded. Follow-up CT may be considered. Electronically Signed   By: Elmer Picker M.D.   On: 07/05/2022 17:41        Scheduled Meds:  azithromycin  500 mg Oral Daily   insulin aspart  0-15 Units Subcutaneous TID WC   insulin aspart  0-5 Units Subcutaneous QHS   insulin aspart  6 Units Subcutaneous TID WC   insulin detemir  18 Units Subcutaneous QHS   levothyroxine  75 mcg Oral q morning   Continuous Infusions:  cefTRIAXone (ROCEPHIN)  IV  Aline August, MD [facility] 07/06/2022, 10:53 AM

## 2022-07-06 NOTE — Progress Notes (Signed)
Subjective: Patient known to me with 2009 history of treatment for tongue base cancer recently evaluated for hoarseness and dysphagia.  He was found to have paresis of the left vocal fold with no active disease process identified on CT.  A barium swallow was not able to be completed due to frank aspiration.  He has a modified barium swallow more recently but was admitted to the hospital yesterday due to pneumonia and left empyema.  Drainage is planned today.  SLP has been consulted.  Objective: Vital signs in last 24 hours: Temp:  [98.3 F (36.8 C)-99.7 F (37.6 C)] 99.7 F (37.6 C) (11/02 0818) Pulse Rate:  [100-125] 107 (11/02 0818) Resp:  [13-33] 22 (11/02 0818) BP: (93-157)/(61-95) 113/84 (11/02 0818) SpO2:  [89 %-98 %] 95 % (11/02 0818) Weight:  [85.1 kg-91.4 kg] 85.1 kg (11/02 1141) Wt Readings from Last 1 Encounters:  07/06/22 85.1 kg    Intake/Output from previous day: 11/01 0701 - 11/02 0700 In: 1223 [P.O.:240; IV Piggyback:983] Out: 0  Intake/Output this shift: Total I/O In: 120 [P.O.:120] Out: -   General appearance: alert, cooperative, and no distress Throat: good voice  Recent Labs    07/05/22 1808 07/06/22 0157  WBC 23.7* 20.8*  HGB 12.5* 10.5*  HCT 39.8 33.4*  PLT 679* 545*    Recent Labs    07/05/22 1808 07/06/22 0157  NA 132* 134*  K 4.8 5.2*  CL 98 102  CO2 23 21*  GLUCOSE 243* 223*  BUN 37* 34*  CREATININE 2.05* 1.93*  CALCIUM 9.4 8.5*    Medications: I have reviewed the patient's current medications.  Assessment/Plan: History of tongue base cancer, left vocal fold paresis, and pharyngoesophageal dysphagia  He is at risk for aspiration.  His pneumonia and empyema may be related.  We had discussed injection augmentation of the vocal fold but this would be expected to improve voice with less reliable benefits for swallow.  He is not bothered by his hoarseness presently.  The esophagus has not been completely evaluated.  A GI consultation for  that purpose could be considered, with potential for dilation.  Otherwise, therapy and guidance from speech pathology remains his best strategy until or unless an alternative means for safe nutrition, ie. gastrostomy tube placement, may become necessary.   LOS: 1 day   Melida Quitter 07/06/2022, 12:53 PM

## 2022-07-06 NOTE — Evaluation (Addendum)
Clinical/Bedside Swallow Evaluation Patient Details  Name: [name] MRN: [id number] Date of Birth: Mar 29, 1956  Today's Date: 07/06/2022 Time: SLP Start Time (ACUTE ONLY): 4098 SLP Stop Time (ACUTE ONLY): 1202 SLP Time Calculation (min) (ACUTE ONLY): 22 min  Past Medical History:  Past Medical History:  Diagnosis Date   Alopecia    s/p chemotherapy   Arthritis    Bursitis    pt. denies   Cancer ([place]) 10/2007    tongue/squamous cell,stg IV,HPV positive   Diabetes mellitus without complication ([place])    takes Amaryl,Januvia,and Metformin,daily   DVT, lower extremity ([place]) 2009   side effect from chemo and has blood clot left leg-wears compression stockings;was on Coumadin for 71months and has been off 5 yrs   History of chemotherapy    Cisplatin/Taxotere,5-FU   History of radiation therapy 01/28/08-03/11/08   left base tongue   History of shingles    Hyperlipidemia    takes Vytorin daily   Hypothyroidism    takes Synthroid daily   Joint pain    Joint swelling    Neuropathy    left foot   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 denies   Right kidney mass    Seborrheic keratosis    multiple on back   Past Surgical History:  Past Surgical History:  Procedure Laterality Date   CARDIAC CATHETERIZATION     feeding tube placed  2009 and then removed   [place] Right 08/05/2020   Procedure: LYMPH NODE DISSECTION;  Surgeon: Raynelle Bring, MD;  Location: [facility];  Service: Urology;  Laterality: Right;   MOUTH SURGERY     PILONIDAL CYST / SINUS EXCISION  1974   port a cath placement     PORT-A-CATH REMOVAL  2010   right wrist fracture Right 2004   ROBOT ASSISTED LAPAROSCOPIC NEPHRECTOMY Right 08/05/2020   Procedure: XI ROBOTIC ASSISTED LAPAROSCOPIC RADICAL NEPHRECTOMY/PARTIAL ADRENALECTOMY;  Surgeon: Raynelle Bring, MD;  Location: [facility];  Service: Urology;   Laterality: Right;   SHOULDER ARTHROSCOPY WITH DISTAL CLAVICLE RESECTION Right 10/26/2016   Procedure: SHOULDER ARTHROSCOPY WITH DISTAL CLAVICLE RESECTION;  Surgeon: Justice Britain, MD;  Location: [facility];  Service: Orthopedics;  Laterality: Right;   SHOULDER ARTHROSCOPY WITH ROTATOR CUFF REPAIR AND SUBACROMIAL DECOMPRESSION Left 07/16/2014   Procedure: LEFT SHOULDER ARTHROSCOPY WITH ROTATOR CUFF REPAIR AND SUBACROMIAL DECOMPRESSION/DISTAL CLAVICLE RESECTION;  Surgeon: Marin Shutter, MD;  Location: [facility];  Service: Orthopedics;  Laterality: Left;   SHOULDER ARTHROSCOPY WITH ROTATOR CUFF REPAIR AND SUBACROMIAL DECOMPRESSION Right 10/26/2016   Procedure: RIGHT SHOULDER ARTHROSCOPY WITH ROTATOR CUFF REPAIR AND SUBACROMIAL DECOMPRESSION;  Surgeon: Justice Britain, MD;  Location: [facility];  Service: Orthopedics;  Laterality: Right;  requests 2hrs   teeth extraction #9     HPI:  66 y.o. male presented with worsening shortness of breath and cough. Chest CT 11/1: "Interval worsening of diffuse bronchial wall thickening with  right lower lobe and right middle lobe development of atypical  infection." OP MBS 05/12/2022 with silent aspiration of all consistencies of liquid.  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    Assessment / Plan /  Recommendation  Clinical Impression  Pt presents with clinical indicators of pharyngeal dysphagia with known hx of chronic, silent aspiration s/p rad tx for oropharyngeal CA 2009.  Pt completed OP MBSS on 05/12/2022 revealing "oropharyngeal dysphagia characterized by decreased mobility across pharyngeal structures that leads to mild silent aspiration of liquids, regardless of texture."  Recommendations at that time to continue regular diet with thin liquids as modified consistencies were of no benefit.  Aspiration precaution recommendations from MBS as follows: " 1. trial a chin tuck with solids, 2. cough and clear throat frequently with intake even if he doesnt feel the need to do so, 3. complete thorough oral care to reduce bacterial load with aspiration, 4. inform providers of dysphagia and 5. consider f/u with SLP for home exercise program."  These were reviewed with pt.  Pt reports that he did not have any speech therapy follow up after this assessment, but he is agreeable to trial of swallow exercise program in house.  Pt will likely require 6-8 weeks of intensive swallow exercise program to effect change in swallow function and should follow with OP SLP for continued dysphagia management.  Pt with hx VF paralysis.  This was never directly addressed.  Pt's vocal quality is strong and clear in conversation. Today pt exhibited intermittent throat clear with thin liquid and regular solids.  On palpation, laryngeal elevation was reduced.  Pt achieved greater hyolaryngeal excursion with a sip of water than with voluntary swallow without bolus trial (dry swallow).  Pt notes that soft foods (e.g. applesauce) are easier to eat for him.  There were no clinical s/s of puree.  Even in the absence of clinical s/s of aspiration, there is still a risk for prandial aspiration given hx of silent aspiration of liquid.  Pt would like to resume regular texture diet with thin liquid. He does still c/o of feeling of stasis with solid textures regularly and may benefit from further assessment and possible treatment for esophageal narrowing.  He states he would like to address the feelings of stasis with PO intake.  Consider GI consult if pt is a candidate for espohageal dilation.  Pt likely does not need a repeat instrumental swallow evaluation at this time.  It is unlikely that swallow function has changed since  September, and pt would like to continue oral diet at this time.    Recommend regular texture with thin liquids with aspiration precautions as noted below.  SLP Visit Diagnosis: Dysphagia, oropharyngeal phase (R13.12)    Aspiration Risk  Moderate aspiration risk    Diet Recommendation Regular;Thin liquid   Liquid Administration via: Cup;Straw  Medication Administration:  (As tolerated)  Supervision: Patient able to self feed  Compensations:  Slow rate; Small sips/bites; Chin tuck with solids; Follow solids with liquid; Clear throat regularly  Postural Changes: Seated upright at 90 degrees    Other  Recommendations Recommended Consults: Consider GI evaluation;Consider esophageal assessment    Recommendations for follow up therapy are one component of a multi-disciplinary discharge planning process, led by the attending physician.  Recommendations may be updated based on patient status, additional functional criteria and insurance authorization.  Follow up Recommendations Outpatient SLP      Assistance Recommended at Discharge None  Functional Status Assessment Patient has had a recent decline in their functional status and demonstrates the ability to make significant improvements in function in a reasonable and predictable amount of time.  Frequency and Duration min 2x/week  2 weeks  Prognosis Prognosis for Safe Diet Advancement: Fair      Swallow Study   General Date of Onset: 07/05/22 HPI: 66 y.o. male presented with worsening shortness of breath and cough. Chest CT 11/1: "Interval worsening of diffuse bronchial wall thickening with  right lower lobe and right middle lobe development of atypical  infection." OP MBS 05/12/2022 with silent aspiration of all consistencies of liquid.  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 Type of Study: Bedside Swallow Evaluation Previous Swallow Assessment: OP MBSS 05/12/2022 Diet Prior to this Study: NPO Temperature Spikes Noted: No History of Recent Intubation: No Behavior/Cognition: Alert;Cooperative;Pleasant mood Oral Cavity Assessment: Within Functional Limits Oral Care Completed by SLP: No Oral Cavity - Dentition: Missing dentition Self-Feeding Abilities: Able to feed self Patient Positioning: Upright in bed Baseline Vocal Quality: Normal Volitional Cough: Strong Volitional Swallow: Able to elicit (very reduced laryngeal elevation)    Oral/Motor/Sensory Function Overall Oral Motor/Sensory Function: Within functional limits Facial ROM: Within Functional Limits Facial Symmetry: Within Functional Limits Lingual ROM: Within Functional Limits Lingual Symmetry: Within Functional Limits Lingual Strength: Within Functional Limits Velum: Within Functional Limits Mandible: Within Functional Limits   Ice Chips Ice chips: Not tested   Thin Liquid Thin Liquid: Impaired Presentation: Straw;Cup Pharyngeal  Phase Impairments: Decreased hyoid-laryngeal movement;Throat Clearing - Delayed;Cough - Delayed    Nectar Thick Nectar Thick Liquid: Not tested   Honey Thick Honey Thick Liquid: Not tested   Puree Puree: Within functional limits Presentation: Spoon;Self Fed   Solid     Solid: Impaired Pharyngeal Phase Impairments: Cough - Delayed      Celedonio Savage, [place], [place] Office: [phone] 07/06/2022,12:20 PM

## 2022-07-06 NOTE — Consult Note (Addendum)
Referring Provider: Dr. Starla Link Primary Care Physician:  [name], MD Primary Gastroenterologist:  Althia Forts  Reason for Consultation:  Dysphagia  HPI: [name] is a 66 y.o. male with progressive solid food dysphagia for the past 6-8 months where throughout any solid meal he will have the sensation of food hanging up and needing to cough when he eats. Denies any trouble swallowing soft foods or liquids. Denies heartburn or abdominal pain.  Has unintentionally lost 10-12 pounds in the past 2 weeks. History of oropharyngeal/tongue cancer s/p chemo/radiation. S/P pleural catheter for a pleural effusion today. On Xarelto as outpt and none since admit on 07/05/22. Speech path eval today and report noted. Modified barium swallow in September showed oropharyngeal dysphagia with mild silent aspiration of liquids of any texture. Barium swallow attempted but aborted due to aspiration of thick barium.  Past Medical History:  Diagnosis Date   Alopecia    s/p chemotherapy   Arthritis    Bursitis    pt. 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,stg IV,HPV positive   Diabetes mellitus without complication ([place])    takes Amaryl,Januvia,and Metformin,daily   DVT, lower extremity ([place]) 2009   side effect from chemo and has blood clot left leg-wears compression stockings;was on Coumadin for 60months and has been off 5 yrs   History of chemotherapy    Cisplatin/Taxotere,5-FU   History of radiation therapy 01/28/08-03/11/08   left base tongue   History of shingles    Hyperlipidemia    takes Vytorin daily   Hypothyroidism    takes Synthroid daily   Joint pain    Joint swelling    Neuropathy    left foot   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 denies   Right kidney mass    Seborrheic keratosis    multiple on back    Past Surgical History:  Procedure Laterality Date   CARDIAC CATHETERIZATION      feeding tube placed  2009 and then removed   [place] Right 08/05/2020   Procedure: LYMPH NODE DISSECTION;  Surgeon: Raynelle Bring, MD;  Location: [facility];  Service: Urology;  Laterality: Right;   MOUTH SURGERY     PILONIDAL CYST / SINUS EXCISION  1974   port a cath placement     PORT-A-CATH REMOVAL  2010   right wrist fracture Right 2004   ROBOT ASSISTED LAPAROSCOPIC NEPHRECTOMY Right 08/05/2020   Procedure: XI ROBOTIC ASSISTED LAPAROSCOPIC RADICAL NEPHRECTOMY/PARTIAL ADRENALECTOMY;  Surgeon: Raynelle Bring, MD;  Location: [facility];  Service: Urology;  Laterality: Right;   SHOULDER ARTHROSCOPY WITH DISTAL CLAVICLE RESECTION Right 10/26/2016   Procedure: SHOULDER ARTHROSCOPY WITH DISTAL CLAVICLE RESECTION;  Surgeon: Justice Britain, MD;  Location: [facility];  Service: Orthopedics;  Laterality: Right;   SHOULDER ARTHROSCOPY WITH ROTATOR CUFF REPAIR AND SUBACROMIAL DECOMPRESSION Left 07/16/2014   Procedure: LEFT SHOULDER ARTHROSCOPY WITH ROTATOR CUFF REPAIR AND SUBACROMIAL DECOMPRESSION/DISTAL CLAVICLE RESECTION;  Surgeon: Marin Shutter, MD;  Location: [facility];  Service: Orthopedics;  Laterality: Left;   SHOULDER ARTHROSCOPY WITH ROTATOR CUFF REPAIR AND SUBACROMIAL DECOMPRESSION Right 10/26/2016   Procedure: RIGHT SHOULDER ARTHROSCOPY WITH ROTATOR CUFF REPAIR AND SUBACROMIAL DECOMPRESSION;  Surgeon: Justice Britain, MD;  Location: [facility];  Service: Orthopedics;  Laterality: Right;  requests 2hrs   teeth extraction #9      Prior to Admission medications  Medication Sig Start Date End Date Taking? Authorizing Provider  acetaminophen (TYLENOL) 500 MG tablet Take 1,000 mg by mouth daily as needed for mild pain or moderate pain.   Yes [provider]  Dulaglutide (TRULICITY) 1.5 HG/9.9ME SOPN Inject 1 pen under the skin once a week Patient taking differently: Inject 1.5 mg into the skin every Sunday. 02/08/22  Yes   ezetimibe-simvastatin (VYTORIN) 10-20 MG tablet  Take 1 tablet by mouth once daily at night. 02/08/22  Yes   insulin detemir (LEVEMIR) 100 UNIT/ML FlexPen Inject 22 units into the skin 2 times a day Patient taking differently: Inject 22 Units into the skin 2 (two) times daily. 02/08/22  Yes   levothyroxine (SYNTHROID) 75 MCG tablet Take 1 tablet by mouth every morning on an empty stomach Patient taking differently: Take 75 mcg by mouth daily before breakfast. 04/27/22  Yes   losartan (COZAAR) 25 MG tablet Take 1 tablet by mouth once a day Patient taking differently: Take 25 mg by mouth at bedtime. 02/08/22  Yes   Multiple Vitamin (MULTIVITAMIN) tablet Take 1 tablet by mouth daily.   Yes [provider]  repaglinide (PRANDIN) 1 MG tablet Take 1/2 to 1 tablet by mouth 15 to 30 minutes before meals twice a day Patient taking differently: Take 1 mg by mouth 2 (two) times daily before a meal. 02/08/22  Yes   rivaroxaban (XARELTO) 10 MG TABS tablet TAKE 1 TABLET BY MOUTH ONCE DAILY WITH FOOD. Patient taking differently: Take 10 mg by mouth in the morning. 05/22/22  Yes   glucose blood (FREESTYLE LITE) test strip Use to test your blood sugar twice a day. 08/15/21     Insulin Pen Needle 32G X 4 MM MISC Use as directed once daily 01/27/22     Lancets (FREESTYLE) lancets Use to check blood sugar twice a day. 08/15/21     methocarbamol (ROBAXIN) 750 MG tablet Take 1 tablet (750 mg total) by mouth every 4 (four) hours as needed for tight muscles. Patient not taking: Reported on 07/06/2022 06/22/22     glimepiride (AMARYL) 4 MG tablet Take 4 mg by mouth 2 (two) times daily.   06/05/20  [provider]    Scheduled Meds:  [MAR Hold] azithromycin  500 mg Oral Daily   [MAR Hold] insulin aspart  0-15 Units Subcutaneous TID WC   [MAR Hold] insulin aspart  0-5 Units Subcutaneous QHS   [MAR Hold] insulin aspart  6 Units Subcutaneous TID WC   [MAR Hold] insulin detemir  18 Units Subcutaneous QHS   [MAR Hold] levothyroxine  75 mcg Oral q morning   sodium  chloride flush  10 mL Intrapleural Q8H   Continuous Infusions:  [MAR Hold] cefTRIAXone (ROCEPHIN)  IV 2 g (07/06/22 1104)   PRN Meds:.[MAR Hold] acetaminophen **OR** [MAR Hold] acetaminophen, [MAR Hold] ondansetron **OR** [MAR Hold] ondansetron (ZOFRAN) IV, [MAR Hold] oxyCODONE  Allergies as of 07/05/2022   (No Known Allergies)    Family History  Problem Relation Age of Onset   Cancer Mother        uterine   Diabetes Brother     Social History   Socioeconomic History   Marital status: Married    Spouse name: Not on file   Number of children: Not on file   Years of education: Not on file   Highest education level: Not on file  Occupational History   Not on file  Tobacco Use   Smoking status: Never   Smokeless tobacco:  Never  Vaping Use   Vaping Use: Never used  Substance and Sexual Activity   Alcohol use: Yes    Comment: occasional drink    Drug use: No   Sexual activity: Yes  Other Topics Concern   Not on file  Social History Narrative   Not on file   Social Determinants of Health   Financial Resource Strain: Not on file  Food Insecurity: Not on file  Transportation Needs: Not on file  Physical Activity: Not on file  Stress: Not on file  Social Connections: Not on file  Intimate Partner Violence: Not on file    Review of Systems: All negative except as stated above in HPI.  Physical Exam: Vital signs: Vitals:   07/06/22 1615 07/06/22 1625  BP: (!) 154/70 (!) 154/74  Pulse: (!) 104 (!) 106  Resp: (!) 26 (!) 34  Temp:    SpO2: 96% 94%  T 97.5  Last BM Date : 07/04/22 General:   Elderly, thin, lethargic, no acute distress  Head: normocephalic, atraumatic Eyes: anicteric sclera Neck: supple, nontender Lungs:  Clear throughout to auscultation.   No wheezes, crackles, or rhonchi. No acute distress. Left-sided pleural catheter noted Heart:  Regular rate and rhythm; no murmurs, clicks, rubs,  or gallops. Abdomen: soft, nontender, nondistended, +BS   Rectal:  Deferred Ext: no edema  GI:  Lab Results: Recent Labs    07/05/22 1808 07/06/22 0157  WBC 23.7* 20.8*  HGB 12.5* 10.5*  HCT 39.8 33.4*  PLT 679* 545*   BMET Recent Labs    07/05/22 1808 07/06/22 0157  NA 132* 134*  K 4.8 5.2*  CL 98 102  CO2 23 21*  GLUCOSE 243* 223*  BUN 37* 34*  CREATININE 2.05* 1.93*  CALCIUM 9.4 8.5*   LFT Recent Labs    07/05/22 1808  PROT 7.6  ALBUMIN 3.3*  AST 28  ALT 23  ALKPHOS 101  BILITOT 0.5   PT/INR Recent Labs    07/05/22 1808  LABPROT 17.0*  INR 1.4*     Studies/Results: DG CHEST PORT 1 VIEW  Result Date: 07/06/2022 CLINICAL DATA:  Pleural effusion, chest tube placement EXAM: PORTABLE CHEST 1 VIEW COMPARISON:  Previous studies including the examination of 07/05/2022 FINDINGS: There is interval placement of left chest tube There is no significant change in large left pleural effusion and infiltrate in left lung. There is interval increase in infiltrate in left apical region. No infiltrates are seen in right lung. There is no evidence of apical pneumothorax. IMPRESSION: There is interval placement of left chest tube. There is large loculated left pleural effusion with no significant change. There is possible increase in infiltrate in the left apical region suggesting worsening of pneumonia. Electronically Signed   By: Elmer Picker M.D.   On: 07/06/2022 16:13   CT Angio Chest PE W and/or Wo Contrast  Result Date: 07/05/2022 CLINICAL DATA:  Pulmonary embolism (PE) suspected, high prob. Cough, fever and chest congestion. Seen by [employer]. Suggested eval for pneumonia. Had "pulled chest muscle" around 06/16/22, hx oc throat CA 2009, rt kidney CA 2021 with chemo/radiation. Pt not a smoker. EXAM: CT ANGIOGRAPHY CHEST WITH CONTRAST TECHNIQUE: Multidetector CT imaging of the chest was performed using the standard protocol during bolus administration of intravenous contrast. Multiplanar CT image reconstructions and MIPs  were obtained to evaluate the vascular anatomy. RADIATION DOSE REDUCTION: This exam was performed according to the departmental dose-optimization program which includes automated exposure control, adjustment of the mA  and/or kV according to patient size and/or use of iterative reconstruction technique. CONTRAST:  81mL OMNIPAQUE IOHEXOL 350 MG/ML SOLN COMPARISON:  CT chest 06/02/2022 FINDINGS: Cardiovascular: Satisfactory opacification of the pulmonary arteries to the segmental level. No evidence of pulmonary embolism. Normal heart size. No significant pericardial effusion. The thoracic aorta is normal in caliber. No atherosclerotic plaque of the thoracic aorta. Three-vessel coronary artery calcifications. Mediastinum/Nodes: Mediastinal lymphadenopathy: As an example a subcarinal 1.7 cm lymph node (5:121) and a pre-vascular 1 cm lymph node (5:99). Borderline enlarged bilateral hilar lymph nodes: 1 cm (5:109) and 1 cm (5:123). No enlarged axillary lymph nodes. Thyroid gland, trachea, and esophagus demonstrate no significant findings. Lungs/Pleura: Interval worsening of diffuse bronchial wall thickening within bilateral lower lobe. Similar-appearing right lower lobe metallic densities that may represent previously aspirated material. No focal consolidation. Interval development of right lower lobe and to lesser extent right middle lobe tree-in-bud nodularity. No pulmonary mass. Interval development of an at least moderate volume left pleural effusion with associated several foci of pleural gas. No pneumothorax. Upper Abdomen: No acute abnormality. Musculoskeletal: No chest wall abnormality. No suspicious lytic or blastic osseous lesions. No acute displaced fracture. Multilevel degenerative changes of the spine. Review of the MIP images confirms the above findings. IMPRESSION: 1. No pulmonary embolus. 2. Interval worsening of diffuse bronchial wall thickening with right lower lobe and right middle lobe development of  atypical infection. 3. Interval development of an at least moderate volume left hydropneumothorax. 4. Mediastinal and hilar lymphadenopathy. Recommend attention on follow-up. Electronically Signed   By: Iven Finn M.D.   On: 07/05/2022 19:29   DG Chest 2 View  Result Date: 07/05/2022 CLINICAL DATA:  Cough, chest pain, shortness of breath EXAM: CHEST - 2 VIEW COMPARISON:  Previous studies including the examination of 06/18/2022 FINDINGS: There is interval opacification of left mid and left lower lung fields suggesting pneumonia and possibly pleural effusion. Part of the effusion appears to be loculated along the lateral chest wall. Possibility of empyema is not excluded. There are numerous small metallic densities in the medial right lower lung field, possibly suggesting previous aspiration of barium. This finding has not changed. IMPRESSION: There is interval appearance of large area of opacification in the left mid and left lower lung fields. Findings suggest pneumonia and large effusion. Part of the effusion appears to be loculated along the lateral chest wall. Possibility of empyema is not excluded. Follow-up CT may be considered. Electronically Signed   By: Elmer Picker M.D.   On: 07/05/2022 17:41    Impression/Plan: Progressive solid food dysphagia with aspiration on MBS and BSw. Patient feels stasis with PO intake per speech path report and wants to eat regular foods. EGD with possible dilation scheduled for tomorrow to be done by Dr. Alessandra Bevels. Regular diet with aspiration precautions. NPO p MN. Supportive care.    LOS: 1 day   Lear Ng  07/06/2022, 4:47 PM  Questions please call [phone]

## 2022-07-06 NOTE — Progress Notes (Signed)
Pt off the unit to IR

## 2022-07-06 NOTE — Op Note (Signed)
Insertion of Chest Tube Procedure Note  TERANCE POMPLUN  [id number]  June 28, 1956  Date:07/06/22  Time:3:55 PM    Provider Performing: Collene Gobble   Procedure: Pleural Catheter Insertion w/ Imaging Guidance [phone])  Indication(s) Effusion  Consent Risks of the procedure as well as the alternatives and risks of each were explained to the patient and/or caregiver.  Consent for the procedure was obtained and is signed in the bedside chart  Anesthesia    Conscious sedation: Fentanyl 50 mcg x 1, Versed 2 mg x 1 Local anesthesia 1% lidocaine   Time Out Verified patient identification, verified procedure, site/side was marked, verified correct patient position, special equipment/implants available, medications/allergies/relevant history reviewed, required imaging and test results available.   Sterile Technique Maximal sterile technique including full sterile barrier drape, hand hygiene, sterile gown, sterile gloves, mask, hair covering, sterile ultrasound probe cover (if used).   Procedure Description Ultrasound used to identify appropriate pleural anatomy for placement and overlying skin marked. Area of placement cleaned and draped in sterile fashion.  A 114 French pigtail pleural catheter was placed into the left pleural space using Seldinger technique. Appropriate return of fluid was obtained.  The tube was connected to atrium and placed on -20 cm H2O wall suction.   Complications/Tolerance None; patient tolerated the procedure well. Chest X-ray is ordered to verify placement.   EBL Minimal  Specimen(s) fluid     Baltazar Apo, MD, PhD 07/06/2022, 3:57 PM [facility] [phone] or if no answer before 7:00PM call [phone] For any issues after 7:00PM please call eLink [phone]

## 2022-07-06 NOTE — Consult Note (Addendum)
Referring Provider: [facility] Primary Care Physician:  [name], MD Primary Gastroenterologist:  [facility] (Dr. Oletta Lamas)  Reason for Consultation:  dysphagia  HPI: [name] is a 66 y.o. 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   He reports the shortness of breath and cough started after he received a flu shot 06/15/2022.  Symptoms were worsening so he decided to present to the ED.  In the ED, WBC 23.7, hemoglobin 12.5, PT 17, INR 1.4, chest x-ray notable for pneumonia and large effusion with possibility of empyema.  CT angio chest negative for pulmonary embolism, no associated interval worsening of diffuse bronchial wall thickening in the right lower lobe and right middle lobe with development of atypical infection, moderate volume left hydropneumothorax, mediastinal and hilar lymphadenopathy.  He was admitted and started on ceftriaxone and azithromycin.  Neurology consulted suspected he had aspiration pneumonia, parapneumonic effusion and possibly an empyema.  Recommended tube drainage of the left pleural space.  Patient underwent placement of pleural drain catheter 11/2.    While admitted patient also complained of ongoing dysphagia.  He notes that starting 6 to 7 months ago he has had intermittent worsening dysphagia to solid foods.  Notes symptoms worse with breads and meats.  No dysphagia to liquids, pills, soft foods.  Notes dysphagia has been occurring more frequently.  He reports a weight loss of 10 to 12 pounds over the last 3 weeks.  Patient has history of squamous cell carcinoma of the tongue status postchemotherapy and radiation.  Patient has outpatient barium swallow 05/12/22 indicating oral pharyngeal dysphagia, aspiration.   Denies melena,  hematochezia, nausea, vomiting.  At home he takes Xarelto, has not been given since admission.  Colonoscopy 04/19/2015 Tubular adenomas, no high-grade dysplasia, recall 5 years  No previous EGD Past Medical History:  Diagnosis Date   Alopecia    s/p chemotherapy   Arthritis    Bursitis    pt. denies   Cancer ([place]) 10/2007    tongue/squamous cell,stg IV,HPV positive   Diabetes mellitus without complication ([place])    takes Amaryl,Januvia,and Metformin,daily   DVT, lower extremity ([place]) 2009   side effect from chemo and has blood clot left leg-wears compression stockings;was on Coumadin for 45months and has been off 5 yrs   History of chemotherapy    Cisplatin/Taxotere,5-FU   History of radiation therapy 01/28/08-03/11/08   left base tongue   History of shingles    Hyperlipidemia    takes Vytorin daily   Hypothyroidism    takes Synthroid daily   Joint pain    Joint swelling    Neuropathy    left foot   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 denies   Right kidney mass    Seborrheic keratosis    multiple on back    Past Surgical History:  Procedure Laterality Date   CARDIAC CATHETERIZATION     feeding tube placed  2009 and then removed   [place] Right 08/05/2020   Procedure: LYMPH NODE DISSECTION;  Surgeon: Raynelle Bring, MD;  Location: [facility];  Service: Urology;  Laterality: Right;   MOUTH SURGERY     PILONIDAL CYST / SINUS  EXCISION  1974   port a cath placement     PORT-A-CATH REMOVAL  2010   right wrist fracture Right 2004   ROBOT ASSISTED LAPAROSCOPIC NEPHRECTOMY Right 08/05/2020   Procedure: XI ROBOTIC ASSISTED LAPAROSCOPIC RADICAL NEPHRECTOMY/PARTIAL ADRENALECTOMY;  Surgeon: Raynelle Bring, MD;  Location: [facility];  Service: Urology;  Laterality: Right;   SHOULDER ARTHROSCOPY WITH DISTAL CLAVICLE RESECTION Right 10/26/2016   Procedure: SHOULDER ARTHROSCOPY  WITH DISTAL CLAVICLE RESECTION;  Surgeon: Justice Britain, MD;  Location: [place];  Service: Orthopedics;  Laterality: Right;   SHOULDER ARTHROSCOPY WITH ROTATOR CUFF REPAIR AND SUBACROMIAL DECOMPRESSION Left 07/16/2014   Procedure: LEFT SHOULDER ARTHROSCOPY WITH ROTATOR CUFF REPAIR AND SUBACROMIAL DECOMPRESSION/DISTAL CLAVICLE RESECTION;  Surgeon: Marin Shutter, MD;  Location: [place];  Service: Orthopedics;  Laterality: Left;   SHOULDER ARTHROSCOPY WITH ROTATOR CUFF REPAIR AND SUBACROMIAL DECOMPRESSION Right 10/26/2016   Procedure: RIGHT SHOULDER ARTHROSCOPY WITH ROTATOR CUFF REPAIR AND SUBACROMIAL DECOMPRESSION;  Surgeon: Justice Britain, MD;  Location: [place];  Service: Orthopedics;  Laterality: Right;  requests 2hrs   teeth extraction #9      Prior to Admission medications   Medication Sig Start Date End Date Taking? Authorizing Provider  acetaminophen (TYLENOL) 500 MG tablet Take 1,000 mg by mouth daily as needed for mild pain or moderate pain.   Yes [provider]  Dulaglutide (TRULICITY) 1.5 NI/7.7OE SOPN Inject 1 pen under the skin once a week Patient taking differently: Inject 1.5 mg into the skin every Sunday. 02/08/22  Yes   ezetimibe-simvastatin (VYTORIN) 10-20 MG tablet Take 1 tablet by mouth once daily at night. 02/08/22  Yes   insulin detemir (LEVEMIR) 100 UNIT/ML FlexPen Inject 22 units into the skin 2 times a day Patient taking differently: Inject 22 Units into the skin 2 (two) times daily. 02/08/22  Yes   levothyroxine (SYNTHROID) 75 MCG tablet Take 1 tablet by mouth every morning on an empty stomach Patient taking differently: Take 75 mcg by mouth daily before breakfast. 04/27/22  Yes   losartan (COZAAR) 25 MG tablet Take 1 tablet by mouth once a day Patient taking differently: Take 25 mg by mouth at bedtime. 02/08/22  Yes   Multiple Vitamin (MULTIVITAMIN) tablet Take 1 tablet by mouth daily.   Yes [provider]  repaglinide (PRANDIN) 1 MG tablet Take 1/2 to 1 tablet by  mouth 15 to 30 minutes before meals twice a day Patient taking differently: Take 1 mg by mouth 2 (two) times daily before a meal. 02/08/22  Yes   rivaroxaban (XARELTO) 10 MG TABS tablet TAKE 1 TABLET BY MOUTH ONCE DAILY WITH FOOD. Patient taking differently: Take 10 mg by mouth in the morning. 05/22/22  Yes   glucose blood (FREESTYLE LITE) test strip Use to test your blood sugar twice a day. 08/15/21     Insulin Pen Needle 32G X 4 MM MISC Use as directed once daily 01/27/22     Lancets (FREESTYLE) lancets Use to check blood sugar twice a day. 08/15/21     methocarbamol (ROBAXIN) 750 MG tablet Take 1 tablet (750 mg total) by mouth every 4 (four) hours as needed for tight muscles. Patient not taking: Reported on 07/06/2022 06/22/22     glimepiride (AMARYL) 4 MG tablet Take 4 mg by mouth 2 (two) times daily.   06/05/20  [provider]    Scheduled Meds:  [MAR Hold] azithromycin  500 mg Oral Daily   [MAR Hold] insulin aspart  0-15 Units Subcutaneous TID WC   [  MAR Hold] insulin aspart  0-5 Units Subcutaneous QHS   [MAR Hold] insulin aspart  6 Units Subcutaneous TID WC   [MAR Hold] insulin detemir  18 Units Subcutaneous QHS   [MAR Hold] levothyroxine  75 mcg Oral q morning   sodium chloride flush  10 mL Intrapleural Q8H   Continuous Infusions:  [MAR Hold] cefTRIAXone (ROCEPHIN)  IV 2 g (07/06/22 1104)   PRN Meds:.[MAR Hold] acetaminophen **OR** [MAR Hold] acetaminophen, [MAR Hold] ondansetron **OR** [MAR Hold] ondansetron (ZOFRAN) IV, [MAR Hold] oxyCODONE  Allergies as of 07/05/2022   (No Known Allergies)    Family History  Problem Relation Age of Onset   Cancer Mother        uterine   Diabetes Brother     Social History   Socioeconomic History   Marital status: Married    Spouse name: Not on file   Number of children: Not on file   Years of education: Not on file   Highest education level: Not on file  Occupational History   Not on file  Tobacco Use   Smoking status:  Never   Smokeless tobacco: Never  Vaping Use   Vaping Use: Never used  Substance and Sexual Activity   Alcohol use: Yes    Comment: occasional drink    Drug use: No   Sexual activity: Yes  Other Topics Concern   Not on file  Social History Narrative   Not on file   Social Determinants of Health   Financial Resource Strain: Not on file  Food Insecurity: Not on file  Transportation Needs: Not on file  Physical Activity: Not on file  Stress: Not on file  Social Connections: Not on file  Intimate Partner Violence: Not on file    Review of Systems: All negative except as stated above in HPI.  Physical Exam:Physical Exam Constitutional:      General: He is not in acute distress.    Appearance: He is normal weight. He is ill-appearing.  HENT:     Head: Normocephalic and atraumatic.     Right Ear: External ear normal.     Left Ear: External ear normal.     Nose: Nose normal.     Mouth/Throat:     Mouth: Mucous membranes are moist.  Eyes:     Pupils: Pupils are equal, round, and reactive to light.  Cardiovascular:     Rate and Rhythm: Regular rhythm. Tachycardia present.     Pulses: Normal pulses.     Heart sounds: Normal heart sounds.  Pulmonary:     Effort: Pulmonary effort is normal.     Breath sounds: Normal breath sounds.  Abdominal:     General: Abdomen is flat. Bowel sounds are normal. There is no distension.     Palpations: Abdomen is soft. There is no mass.     Tenderness: There is no abdominal tenderness. There is no guarding or rebound.     Hernia: No hernia is present.  Musculoskeletal:        General: Normal range of motion.     Cervical back: Normal range of motion and neck supple.  Skin:    General: Skin is warm and dry.  Neurological:     General: No focal deficit present.     Mental Status: He is alert and oriented to person, place, and time. Mental status is at baseline.  Psychiatric:        Mood and Affect: Mood normal.  Behavior: Behavior  normal.     Vital signs: Vitals:   07/06/22 1615 07/06/22 1625  BP: (!) 154/70 (!) 154/74  Pulse: (!) 104 (!) 106  Resp: (!) 26 (!) 34  Temp:    SpO2: 96% 94%   Last BM Date : 07/04/22    GI:  Lab Results: Recent Labs    07/05/22 1808 07/06/22 0157  WBC 23.7* 20.8*  HGB 12.5* 10.5*  HCT 39.8 33.4*  PLT 679* 545*   BMET Recent Labs    07/05/22 1808 07/06/22 0157  NA 132* 134*  K 4.8 5.2*  CL 98 102  CO2 23 21*  GLUCOSE 243* 223*  BUN 37* 34*  CREATININE 2.05* 1.93*  CALCIUM 9.4 8.5*   LFT Recent Labs    07/05/22 1808  PROT 7.6  ALBUMIN 3.3*  AST 28  ALT 23  ALKPHOS 101  BILITOT 0.5   PT/INR Recent Labs    07/05/22 1808  LABPROT 17.0*  INR 1.4*     Studies/Results: DG CHEST PORT 1 VIEW  Result Date: 07/06/2022 CLINICAL DATA:  Pleural effusion, chest tube placement EXAM: PORTABLE CHEST 1 VIEW COMPARISON:  Previous studies including the examination of 07/05/2022 FINDINGS: There is interval placement of left chest tube There is no significant change in large left pleural effusion and infiltrate in left lung. There is interval increase in infiltrate in left apical region. No infiltrates are seen in right lung. There is no evidence of apical pneumothorax. IMPRESSION: There is interval placement of left chest tube. There is large loculated left pleural effusion with no significant change. There is possible increase in infiltrate in the left apical region suggesting worsening of pneumonia. Electronically Signed   By: Elmer Picker M.D.   On: 07/06/2022 16:13   CT Angio Chest PE W and/or Wo Contrast  Result Date: 07/05/2022 CLINICAL DATA:  Pulmonary embolism (PE) suspected, high prob. Cough, fever and chest congestion. Seen by [employer]. Suggested eval for pneumonia. Had "pulled chest muscle" around 06/16/22, hx oc throat CA 2009, rt kidney CA 2021 with chemo/radiation. Pt not a smoker. EXAM: CT ANGIOGRAPHY CHEST WITH CONTRAST TECHNIQUE: Multidetector  CT imaging of the chest was performed using the standard protocol during bolus administration of intravenous contrast. Multiplanar CT image reconstructions and MIPs were obtained to evaluate the vascular anatomy. RADIATION DOSE REDUCTION: This exam was performed according to the departmental dose-optimization program which includes automated exposure control, adjustment of the mA and/or kV according to patient size and/or use of iterative reconstruction technique. CONTRAST:  42mL OMNIPAQUE IOHEXOL 350 MG/ML SOLN COMPARISON:  CT chest 06/02/2022 FINDINGS: Cardiovascular: Satisfactory opacification of the pulmonary arteries to the segmental level. No evidence of pulmonary embolism. Normal heart size. No significant pericardial effusion. The thoracic aorta is normal in caliber. No atherosclerotic plaque of the thoracic aorta. Three-vessel coronary artery calcifications. Mediastinum/Nodes: Mediastinal lymphadenopathy: As an example a subcarinal 1.7 cm lymph node (5:121) and a pre-vascular 1 cm lymph node (5:99). Borderline enlarged bilateral hilar lymph nodes: 1 cm (5:109) and 1 cm (5:123). No enlarged axillary lymph nodes. Thyroid gland, trachea, and esophagus demonstrate no significant findings. Lungs/Pleura: Interval worsening of diffuse bronchial wall thickening within bilateral lower lobe. Similar-appearing right lower lobe metallic densities that may represent previously aspirated material. No focal consolidation. Interval development of right lower lobe and to lesser extent right middle lobe tree-in-bud nodularity. No pulmonary mass. Interval development of an at least moderate volume left pleural effusion with associated several foci of pleural gas. No  pneumothorax. Upper Abdomen: No acute abnormality. Musculoskeletal: No chest wall abnormality. No suspicious lytic or blastic osseous lesions. No acute displaced fracture. Multilevel degenerative changes of the spine. Review of the MIP images confirms the above  findings. IMPRESSION: 1. No pulmonary embolus. 2. Interval worsening of diffuse bronchial wall thickening with right lower lobe and right middle lobe development of atypical infection. 3. Interval development of an at least moderate volume left hydropneumothorax. 4. Mediastinal and hilar lymphadenopathy. Recommend attention on follow-up. Electronically Signed   By: Iven Finn M.D.   On: 07/05/2022 19:29   DG Chest 2 View  Result Date: 07/05/2022 CLINICAL DATA:  Cough, chest pain, shortness of breath EXAM: CHEST - 2 VIEW COMPARISON:  Previous studies including the examination of 06/18/2022 FINDINGS: There is interval opacification of left mid and left lower lung fields suggesting pneumonia and possibly pleural effusion. Part of the effusion appears to be loculated along the lateral chest wall. Possibility of empyema is not excluded. There are numerous small metallic densities in the medial right lower lung field, possibly suggesting previous aspiration of barium. This finding has not changed. IMPRESSION: There is interval appearance of large area of opacification in the left mid and left lower lung fields. Findings suggest pneumonia and large effusion. Part of the effusion appears to be loculated along the lateral chest wall. Possibility of empyema is not excluded. Follow-up CT may be considered. Electronically Signed   By: Elmer Picker M.D.   On: 07/05/2022 17:41    Impression: Dysphagia  Patient was so stable with history of dysphagia to solid foods.  Denies dysphagia to liquids, pills, soft foods.  Notes dysphagia has been worsening.  He has no previous EGD.  He does have history of SCC of the tongue for which she underwent chemotherapy and radiation.  Possible may have some stricturing from previous radiation.  His barium swallow significant for aspiration suspect patient's current pneumonia due to aspiration.  Patient reporting 10 to 12 pound weight loss over the last 2 to 3 weeks.     Plan: Plan for EGD with dilation tomorrow with Dr. Alessandra Bevels. I thoroughly discussed the procedures to include nature, alternatives, benefits, and risks including but not limited to bleeding, perforation, infection, anesthesia/cardiac and pulmonary complications. Patient provides understanding and gave verbal consent to proceed. NPO at midnight 11/3 Continue anti-emetics and supportive care as needed. [facility] will follow.     LOS: 1 day   Charlott Rakes  PA-C 07/06/2022, 4:31 PM  Contact #  [phone]

## 2022-07-07 ENCOUNTER — Encounter (HOSPITAL_COMMUNITY)
Admission: EM | Disposition: A | Discharge status: Home / Self Care | Payer: Self-pay | Source: Home / Self Care | Attending: Internal Medicine

## 2022-07-07 ENCOUNTER — Encounter (HOSPITAL_COMMUNITY): Payer: Self-pay | Admitting: Internal Medicine

## 2022-07-07 ENCOUNTER — Inpatient Hospital Stay (HOSPITAL_COMMUNITY): Payer: 59 | Admitting: Certified Registered Nurse Anesthetist

## 2022-07-07 DIAGNOSIS — J189 Pneumonia, unspecified organism: Secondary | ICD-10-CM | POA: Diagnosis not present

## 2022-07-07 DIAGNOSIS — I3139 Other pericardial effusion (noninflammatory): Secondary | ICD-10-CM | POA: Diagnosis not present

## 2022-07-07 DIAGNOSIS — D631 Anemia in chronic kidney disease: Secondary | ICD-10-CM | POA: Diagnosis not present

## 2022-07-07 DIAGNOSIS — R0602 Shortness of breath: Secondary | ICD-10-CM | POA: Diagnosis not present

## 2022-07-07 DIAGNOSIS — R846 Abnormal cytological findings in specimens from respiratory organs and thorax: Secondary | ICD-10-CM | POA: Diagnosis not present

## 2022-07-07 DIAGNOSIS — J690 Pneumonitis due to inhalation of food and vomit: Secondary | ICD-10-CM | POA: Diagnosis not present

## 2022-07-07 DIAGNOSIS — J939 Pneumothorax, unspecified: Secondary | ICD-10-CM | POA: Diagnosis not present

## 2022-07-07 DIAGNOSIS — K2950 Unspecified chronic gastritis without bleeding: Secondary | ICD-10-CM | POA: Diagnosis not present

## 2022-07-07 DIAGNOSIS — T182XXA Foreign body in stomach, initial encounter: Secondary | ICD-10-CM | POA: Diagnosis not present

## 2022-07-07 DIAGNOSIS — J948 Other specified pleural conditions: Secondary | ICD-10-CM | POA: Diagnosis not present

## 2022-07-07 DIAGNOSIS — K2289 Other specified disease of esophagus: Secondary | ICD-10-CM

## 2022-07-07 DIAGNOSIS — D638 Anemia in other chronic diseases classified elsewhere: Secondary | ICD-10-CM | POA: Diagnosis not present

## 2022-07-07 DIAGNOSIS — D72829 Elevated white blood cell count, unspecified: Secondary | ICD-10-CM | POA: Diagnosis not present

## 2022-07-07 DIAGNOSIS — J159 Unspecified bacterial pneumonia: Secondary | ICD-10-CM | POA: Diagnosis not present

## 2022-07-07 DIAGNOSIS — I700 Atherosclerosis of aorta: Secondary | ICD-10-CM | POA: Diagnosis not present

## 2022-07-07 DIAGNOSIS — R079 Chest pain, unspecified: Secondary | ICD-10-CM | POA: Diagnosis not present

## 2022-07-07 DIAGNOSIS — K2990 Gastroduodenitis, unspecified, without bleeding: Secondary | ICD-10-CM | POA: Diagnosis not present

## 2022-07-07 DIAGNOSIS — E119 Type 2 diabetes mellitus without complications: Secondary | ICD-10-CM

## 2022-07-07 DIAGNOSIS — A419 Sepsis, unspecified organism: Secondary | ICD-10-CM | POA: Diagnosis not present

## 2022-07-07 DIAGNOSIS — E11649 Type 2 diabetes mellitus with hypoglycemia without coma: Secondary | ICD-10-CM | POA: Diagnosis not present

## 2022-07-07 DIAGNOSIS — K2970 Gastritis, unspecified, without bleeding: Secondary | ICD-10-CM | POA: Diagnosis not present

## 2022-07-07 DIAGNOSIS — J869 Pyothorax without fistula: Secondary | ICD-10-CM | POA: Diagnosis not present

## 2022-07-07 DIAGNOSIS — J9601 Acute respiratory failure with hypoxia: Secondary | ICD-10-CM | POA: Diagnosis not present

## 2022-07-07 DIAGNOSIS — J90 Pleural effusion, not elsewhere classified: Secondary | ICD-10-CM | POA: Diagnosis not present

## 2022-07-07 DIAGNOSIS — K2980 Duodenitis without bleeding: Secondary | ICD-10-CM | POA: Diagnosis not present

## 2022-07-07 DIAGNOSIS — R091 Pleurisy: Secondary | ICD-10-CM | POA: Diagnosis not present

## 2022-07-07 DIAGNOSIS — R1310 Dysphagia, unspecified: Secondary | ICD-10-CM | POA: Diagnosis not present

## 2022-07-07 HISTORY — PX: BIOPSY: SHX5522

## 2022-07-07 HISTORY — PX: ESOPHAGOGASTRODUODENOSCOPY (EGD) WITH PROPOFOL: SHX5813

## 2022-07-07 LAB — BASIC METABOLIC PANEL
Anion gap: 6 (ref 5–15)
BUN: 33 mg/dL — ABNORMAL HIGH (ref 8–23)
CO2: 22 mmol/L (ref 22–32)
Calcium: 8.3 mg/dL — ABNORMAL LOW (ref 8.9–10.3)
Chloride: 105 mmol/L (ref 98–111)
Creatinine, Ser: 1.85 mg/dL — ABNORMAL HIGH (ref 0.61–1.24)
GFR, Estimated: 39 mL/min — ABNORMAL LOW (ref >60)
Glucose, Bld: 233 mg/dL — ABNORMAL HIGH (ref 70–99)
Potassium: 4.2 mmol/L (ref 3.5–5.1)
Sodium: 133 mmol/L — ABNORMAL LOW (ref 135–145)

## 2022-07-07 LAB — CBC WITH DIFFERENTIAL/PLATELET
Abs Immature Granulocytes: 0.15 10*3/uL — ABNORMAL HIGH (ref 0.00–0.07)
Basophils Absolute: 0 10*3/uL (ref 0.0–0.1)
Basophils Relative: 0 %
Eosinophils Absolute: 0 10*3/uL (ref 0.0–0.5)
Eosinophils Relative: 0 %
HCT: 33.3 % — ABNORMAL LOW (ref 39.0–52.0)
Hemoglobin: 10.9 g/dL — ABNORMAL LOW (ref 13.0–17.0)
Immature Granulocytes: 1 %
Lymphocytes Relative: 4 %
Lymphs Abs: 0.7 10*3/uL (ref 0.7–4.0)
MCH: 28.7 pg (ref 26.0–34.0)
MCHC: 32.7 g/dL (ref 30.0–36.0)
MCV: 87.6 fL (ref 80.0–100.0)
Monocytes Absolute: 1.2 10*3/uL — ABNORMAL HIGH (ref 0.1–1.0)
Monocytes Relative: 7 %
Neutro Abs: 15.2 10*3/uL — ABNORMAL HIGH (ref 1.7–7.7)
Neutrophils Relative %: 88 %
Platelets: 483 10*3/uL — ABNORMAL HIGH (ref 150–400)
RBC: 3.8 MIL/uL — ABNORMAL LOW (ref 4.22–5.81)
RDW: 13.2 % (ref 11.5–15.5)
WBC: 17.3 10*3/uL — ABNORMAL HIGH (ref 4.0–10.5)
nRBC: 0 % (ref 0.0–0.2)

## 2022-07-07 LAB — MAGNESIUM: Magnesium: 1.9 mg/dL (ref 1.7–2.4)

## 2022-07-07 LAB — GLUCOSE, CAPILLARY
Glucose-Capillary: 156 mg/dL — ABNORMAL HIGH (ref 70–99)
Glucose-Capillary: 135 mg/dL — ABNORMAL HIGH (ref 70–99)
Glucose-Capillary: 260 mg/dL — ABNORMAL HIGH (ref 70–99)

## 2022-07-07 LAB — PATHOLOGIST SMEAR REVIEW

## 2022-07-07 SURGERY — ESOPHAGOGASTRODUODENOSCOPY (EGD) WITH PROPOFOL
Anesthesia: Monitor Anesthesia Care

## 2022-07-07 MED ORDER — ORAL CARE MOUTH RINSE: 15.0000 mL | OROMUCOSAL | Status: DC | PRN

## 2022-07-07 MED ORDER — PROPOFOL 500 MG/50ML IV EMUL
INTRAVENOUS | Status: DC | PRN
  Administered 2022-07-07: 75 ug/kg/min via INTRAVENOUS

## 2022-07-07 MED ORDER — METRONIDAZOLE 500 MG/100ML IV SOLN
500.0000 mg | Freq: Two times a day (BID) | INTRAVENOUS | Status: DC
  Administered 2022-07-07 – 2022-07-13 (×12): 500 mg via INTRAVENOUS
  Filled 2022-07-07 (×12): qty 100

## 2022-07-07 MED ORDER — SODIUM CHLORIDE 0.9 % IV SOLN
500.0000 mg | INTRAVENOUS | Status: DC
  Administered 2022-07-07 – 2022-07-10 (×4): 500 mg via INTRAVENOUS
  Filled 2022-07-07 (×5): qty 5

## 2022-07-07 MED ORDER — PROPOFOL 10 MG/ML IV BOLUS
INTRAVENOUS | Status: DC | PRN
  Administered 2022-07-07: 40 mg via INTRAVENOUS

## 2022-07-07 MED ORDER — PANTOPRAZOLE SODIUM 40 MG PO TBEC
40.0000 mg | DELAYED_RELEASE_TABLET | Freq: Two times a day (BID) | ORAL | Status: DC
  Filled 2022-07-07 (×3): qty 1

## 2022-07-07 MED ORDER — PANTOPRAZOLE SODIUM 40 MG PO TBEC
40.0000 mg | DELAYED_RELEASE_TABLET | Freq: Two times a day (BID) | ORAL | Status: DC
  Administered 2022-07-07 – 2022-07-14 (×15): 40 mg via ORAL
  Filled 2022-07-07 (×14): qty 1

## 2022-07-07 MED ORDER — PHENYLEPHRINE 80 MCG/ML (10ML) SYRINGE FOR IV PUSH (FOR BLOOD PRESSURE SUPPORT)
PREFILLED_SYRINGE | INTRAVENOUS | Status: DC | PRN
  Administered 2022-07-07: 160 ug via INTRAVENOUS

## 2022-07-07 MED ORDER — LACTATED RINGERS IV SOLN: INTRAVENOUS | Status: DC | PRN

## 2022-07-07 MED ORDER — ORAL CARE MOUTH RINSE
15.0000 mL | OROMUCOSAL | Status: DC
  Administered 2022-07-07 – 2022-07-14 (×26): 15 mL via OROMUCOSAL

## 2022-07-07 MED ORDER — ENOXAPARIN SODIUM 100 MG/ML IJ SOSY
1.0000 mg/kg | PREFILLED_SYRINGE | Freq: Two times a day (BID) | INTRAMUSCULAR | Status: DC
  Administered 2022-07-07 – 2022-07-10 (×6): 82.5 mg via SUBCUTANEOUS
  Filled 2022-07-07 (×6): qty 0.82

## 2022-07-07 SURGICAL SUPPLY — 15 items

## 2022-07-07 NOTE — Progress Notes (Signed)
ANTICOAGULATION CONSULT NOTE - Initial Consult  Pharmacy Consult for lovenox Indication:  history of LE DVT in 2009 and PE in 2016 on Xarelto PTA   No Known Allergies  Patient Measurements: Height: 6\' 3"  (190.5 cm) Weight: 83 kg (182 lb 15.7 oz) IBW/kg (Calculated) : 84.5  Vital Signs: Temp: 98 F (36.7 C) (11/03 1235) Temp Source: Temporal (11/03 1235) BP: 132/70 (11/03 1255) Pulse Rate: 99 (11/03 1255)  Labs: Recent Labs    07/05/22 1808 07/06/22 0157 07/07/22 0318  HGB 12.5* 10.5* 10.9*  HCT 39.8 33.4* 33.3*  PLT 679* 545* 483*  APTT 47*  --   --   LABPROT 17.0*  --   --   INR 1.4*  --   --   CREATININE 2.05* 1.93* 1.85*    Estimated Creatinine Clearance: 44.9 mL/min (A) (by C-G formula based on SCr of 1.85 mg/dL (H)).   Medical History: Past Medical History:  Diagnosis Date   Alopecia    s/p chemotherapy   Arthritis    Bursitis    pt. denies   Cancer ([place]) 10/2007    tongue/squamous cell,stg IV,HPV positive   Diabetes mellitus without complication ([place])    takes Amaryl,Januvia,and Metformin,daily   DVT, lower extremity ([place]) 2009   side effect from chemo and has blood clot left leg-wears compression stockings;was on Coumadin for 22months and has been off 5 yrs   History of chemotherapy    Cisplatin/Taxotere,5-FU   History of radiation therapy 01/28/08-03/11/08   left base tongue   History of shingles    Hyperlipidemia    takes Vytorin daily   Hypothyroidism    takes Synthroid daily   Joint pain    Joint swelling    Neuropathy    left foot   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 denies   Right kidney mass    Seborrheic keratosis    multiple on back    Medications:  Medications Prior to Admission  Medication Sig Dispense Refill Last Dose   acetaminophen (TYLENOL) 500 MG tablet Take 1,000 mg by mouth daily as needed for mild pain or moderate pain.   Past Week    Dulaglutide (TRULICITY) 1.5 [place]/6.3ZC SOPN Inject 1 pen under the skin once a week (Patient taking differently: Inject 1.5 mg into the skin every Sunday.) 6 mL 4 Past Week   ezetimibe-simvastatin (VYTORIN) 10-20 MG tablet Take 1 tablet by mouth once daily at night. 90 tablet 4 Past Week   insulin detemir (LEVEMIR) 100 UNIT/ML FlexPen Inject 22 units into the skin 2 times a day (Patient taking differently: Inject 22 Units into the skin 2 (two) times daily.) 15 mL 4 07/05/2022   levothyroxine (SYNTHROID) 75 MCG tablet Take 1 tablet by mouth every morning on an empty stomach (Patient taking differently: Take 75 mcg by mouth daily before breakfast.) 90 tablet 3 07/05/2022   losartan (COZAAR) 25 MG tablet Take 1 tablet by mouth once a day (Patient taking differently: Take 25 mg by mouth at bedtime.) 90 tablet 4 Past Week   Multiple Vitamin (MULTIVITAMIN) tablet Take 1 tablet by mouth daily.   Past Week   repaglinide (PRANDIN) 1 MG tablet Take 1/2 to 1 tablet by mouth 15 to 30 minutes before meals twice a day (Patient taking differently: Take 1 mg by mouth 2 (two) times daily before a meal.) 90 tablet 6 Past Week   rivaroxaban (XARELTO) 10  MG TABS tablet TAKE 1 TABLET BY MOUTH ONCE DAILY WITH FOOD. (Patient taking differently: Take 10 mg by mouth in the morning.) 90 tablet 4 07/05/2022 at 1000   glucose blood (FREESTYLE LITE) test strip Use to test your blood sugar twice a day. 100 each 0    Insulin Pen Needle 32G X 4 MM MISC Use as directed once daily 100 each 3    Lancets (FREESTYLE) lancets Use to check blood sugar twice a day. 100 each 4    methocarbamol (ROBAXIN) 750 MG tablet Take 1 tablet (750 mg total) by mouth every 4 (four) hours as needed for tight muscles. (Patient not taking: Reported on 07/06/2022) 90 tablet 1 Not Taking    Assessment: 51 YOM admitted with CC of cough and SOB. He is s/p chest tube placement on 11/2 and s/p EGD on 11/3 who may require further procedures requiring holding doac. Last  dose of Xarelto was 11/2/20234 in the morning.   Goal of Therapy:     Plan:  Start lovenox 1 mg/kg [place] q12h Monitor for signs and symptoms of bleeding. Follow-up on when to restart Xarelto.   Vaughan Basta BS, PharmD, BCPS Clinical Pharmacist 07/07/2022 1:55 PM  Contact: [phone] after 3 PM  "Be curious, not judgmental..." -Jamal Maes

## 2022-07-07 NOTE — Anesthesia Preprocedure Evaluation (Signed)
Anesthesia Evaluation  Patient identified by MRN, date of birth, ID band Patient awake    Reviewed: Allergy & Precautions, H&P , NPO status , Patient's Chart, lab work & pertinent test results  Airway Mallampati: II   Neck ROM: full    Dental   Pulmonary neg pulmonary ROS   breath sounds clear to auscultation       Cardiovascular negative cardio ROS  Rhythm:regular Rate:Normal     Neuro/Psych  PSYCHIATRIC DISORDERS  Depression       GI/Hepatic   Endo/Other  diabetes, Type 2Hypothyroidism    Renal/GU Renal InsufficiencyRenal disease     Musculoskeletal  (+) Arthritis ,    Abdominal   Peds  Hematology   Anesthesia Other Findings   Reproductive/Obstetrics                             Anesthesia Physical Anesthesia Plan  ASA: 3  Anesthesia Plan: MAC   Post-op Pain Management:    Induction: Intravenous  PONV Risk Score and Plan: 1 and Propofol infusion and Treatment may vary due to age or medical condition  Airway Management Planned: Nasal Cannula  Additional Equipment:   Intra-op Plan:   Post-operative Plan:   Informed Consent: I have reviewed the patients History and Physical, chart, labs and discussed the procedure including the risks, benefits and alternatives for the proposed anesthesia with the patient or authorized representative who has indicated his/her understanding and acceptance.     Dental advisory given  Plan Discussed with: CRNA, Anesthesiologist and Surgeon  Anesthesia Plan Comments:        Anesthesia Quick Evaluation

## 2022-07-07 NOTE — Inpatient Diabetes Management (Signed)
Inpatient Diabetes Program Recommendations  AACE/ADA: New Consensus Statement on Inpatient Glycemic Control   Target Ranges:  Prepandial:   less than 140 mg/dL      Peak postprandial:   less than 180 mg/dL (1-2 hours)      Critically ill patients:  140 - 180 mg/dL    Latest Reference Range & Units 07/06/22 07:36 07/06/22 12:06 07/06/22 17:56 07/06/22 21:23 07/07/22 08:09  Glucose-Capillary 70 - 99 mg/dL 167 (H) 146 (H) 221 (H) 320 (H) 156 (H)    Latest Reference Range & Units 07/06/22 01:57  Hemoglobin A1C 4.8 - 5.6 % 9.7 (H)   Review of Glycemic Control  Diabetes history: DM2 Diabetes medications: Levemir 22 units BID, Prandin 1 mg BID (lunch and supper), Trulicity 1.5 mg Qweek (Sunday) Current orders for Inpatient glycemic control: Levemir 18 units QHS, 0-15 units TID with meals, Novolog 0-5 units QHS, Novolog 6 units TID with meals  Inpatient Diabetes Program Recommendations:    Insulin: Per chart, patient only received meal coverage insulin with lunch on 07/06/22 (patient reports he only ate one meal at lunch yesterday). Patient is currently NPO for EGD today. Question if Levemir is not lasting 24 hours given glucose up to 320 mg/dl at 21:23 on 07/06/22.  May want to consider ordering Levemir 5 units daily (for 10am).  HbgA1C: A1C 9.7% on 07/06/22 indicating an average glucose of 232 mg/dl over the past 2-3 months.  NOTE: Spoke with patient over the phone about diabetes and home regimen for diabetes control. Patient reports he takes Levemir 22 units BID, Prandin 1 mg BID, and Trulicity 1.5 mg Qweek as an outpatient for diabetes control. Patient reports taking DM medications as prescribed and reports glucose is usually in the 100's mg/dl at home.  Patient states that he does not know what last A1C was but reports it is usually in the 7-8% range.   Discussed A1C results (9.7% on 07/06/22) and explained that current A1C indicates an average glucose of 232 mg/dl over the past 2-3 months.  Discussed glucose and A1C goals. Discussed importance of checking CBGs and maintaining good CBG control to prevent long-term and short-term complications. Patient reports that he thought he pulled a muscle a few weeks ago and he has not been as active as usual since then. Patient denies any steroids within the past 3 months. Encouraged patient to follow up with PCP regarding DM control.  Patient verbalized understanding of information discussed and reports no further questions at this time related to diabetes.  Thanks, Barnie Alderman, RN, MSN, CDE Diabetes Coordinator Inpatient Diabetes Program [phone] (Team Pager)

## 2022-07-07 NOTE — Progress Notes (Signed)
Pt has been NPO after midnight

## 2022-07-07 NOTE — Transfer of Care (Signed)
Immediate Anesthesia Transfer of Care Note  Patient: MALICHI PALARDY  Procedure(s) Performed: ESOPHAGOGASTRODUODENOSCOPY (EGD) WITH PROPOFOL BIOPSY  Patient Location: Endoscopy Unit  Anesthesia Type:MAC  Level of Consciousness: drowsy and patient cooperative  Airway & Oxygen Therapy: Patient Spontanous Breathing and Patient connected to nasal cannula oxygen  Post-op Assessment: Report given to RN, Post -op Vital signs reviewed and stable, and Patient moving all extremities X 4  Post vital signs: Reviewed and stable  Last Vitals:  Vitals Value Taken Time  BP 90/55 07/07/22 1234  Temp    Pulse 101 07/07/22 1236  Resp 29 07/07/22 1236  SpO2 94 % 07/07/22 1236  Vitals shown include unvalidated device data.  Last Pain:  Vitals:   07/07/22 1034  TempSrc: Temporal  PainSc: 0-No pain      Patients Stated Pain Goal: 0 (16/57/90 3833)  Complications: No notable events documented.

## 2022-07-07 NOTE — Anesthesia Postprocedure Evaluation (Signed)
Anesthesia Post Note  Patient: [name]  Procedure(s) Performed: ESOPHAGOGASTRODUODENOSCOPY (EGD) WITH PROPOFOL BIOPSY     Patient location during evaluation: PACU Anesthesia Type: MAC Level of consciousness: awake and alert Pain management: pain level controlled Vital Signs Assessment: post-procedure vital signs reviewed and stable Respiratory status: spontaneous breathing, nonlabored ventilation, respiratory function stable and patient connected to nasal cannula oxygen Cardiovascular status: stable and blood pressure returned to baseline Postop Assessment: no apparent nausea or vomiting Anesthetic complications: no   No notable events documented.  Last Vitals:  Vitals:   07/07/22 1034 07/07/22 1235  BP: (!) 159/84 (!) 90/55  Pulse: (!) 112 (!) 102  Resp: 20 (!) 27  Temp: (!) 36.4 C 36.7 C  SpO2: 95% 95%    Last Pain:  Vitals:   07/07/22 1235  TempSrc: Temporal  PainSc: 0-No pain                 Marck Mcclenny S

## 2022-07-07 NOTE — Op Note (Signed)
[facility] Patient Name: [name] Procedure Date : 07/07/2022 MRN: [id number] Attending MD: Danton Clap DO, DO, [id number] Date of Birth: 1956/03/11 CSN: [id number] Age: 66 Admit Type: Inpatient Procedure:                Upper GI endoscopy Indications:              Dysphagia Providers:                Danton Clap DO, DO, Dulcy Fanny, Cherylynn Ridges, Technician, Larene Beach, CRNA Referring MD:              Medicines:                See the Anesthesia note for documentation of the                            administered medications Complications:            No immediate complications. Estimated blood loss:                            Minimal. Estimated Blood Loss:     Estimated blood loss was minimal. Estimated blood                            loss: none. Procedure:                Pre-Anesthesia Assessment:                           - ASA Grade Assessment: III - A patient with severe                            systemic disease.                           After obtaining informed consent, the endoscope was                            passed under direct vision. Throughout the                            procedure, the patient's blood pressure, pulse, and                            oxygen saturations were monitored continuously. The                            [id number]-[id number] ([id number]) Olympus endoscope was introduced                            through the mouth, and advanced to the second part                            of duodenum. The upper GI endoscopy was  accomplished without difficulty. The patient                            tolerated the procedure well. Scope In: Scope Out: Findings:      The Z-line was regular and was found 47 cm from the incisors. No       biopsies or other specimens were collected for this exam.      Islands of salmon-colored mucosa were present at 20 cm. No other visible        abnormalities were present. Biopsies were taken with a cold forceps for       histology.      A suture was found on the greater curvature of the stomach.      Diffuse mild inflammation characterized by erosions and erythema was       found in the gastric body and in the gastric antrum. Biopsies were taken       with a cold forceps for Helicobacter pylori testing.      Localized mild inflammation characterized by congestion (edema) was       found in the duodenal bulb. No biopsies or other specimens were       collected for this exam.      The first portion of the duodenum and second portion of the duodenum       were normal. No biopsies or other specimens were collected for this exam. Impression:               - Z-line regular, 47 cm from the incisors. No                            specimens collected.                           - Salmon-colored mucosa. Biopsied.                           - A suture was found in the stomach.                           - Gastritis. Biopsied.                           - Duodenitis. No specimens collected.                           - Normal first portion of the duodenum and second                            portion of the duodenum. No specimens collected. Recommendation:           - Return patient to hospital ward for ongoing care.                           - Resume previous diet indefinitely.                           - Await pathology results.                           -  Use Prilosec (omeprazole) 20 mg PO BID for 8                            weeks.                           - Perform ambulatory esophageal manometry at                            appointment to be scheduled. Procedure Code(s):        --- Professional ---                           [phone], Esophagogastroduodenoscopy, flexible,                            transoral; with biopsy, single or multiple Diagnosis Code(s):        --- Professional ---                           K22.89, Other specified  disease of esophagus                           T18.2XXA, Foreign body in stomach, initial encounter                           K29.70, Gastritis, unspecified, without bleeding                           K29.80, Duodenitis without bleeding                           R13.10, Dysphagia, unspecified CPT copyright 2022 American Medical Association. All rights reserved. The codes documented in this report are preliminary and upon coder review may  be revised to meet current compliance requirements. Dr Danton Clap, [place], DO 07/07/2022 12:46:50 PM Number of Addenda: 0

## 2022-07-07 NOTE — Interval H&P Note (Signed)
History and Physical Interval Note:  07/07/2022 12:02 PM  [name]  has presented today for surgery, with the diagnosis of dysphagia.  The various methods of treatment have been discussed with the patient and family. After consideration of risks, benefits and other options for treatment, the patient has consented to  Procedure(s): ESOPHAGOGASTRODUODENOSCOPY (EGD) WITH PROPOFOL (N/A) Possible BALLOON DILATION (N/A) as a surgical intervention.  The patient's history has been reviewed, patient examined, no change in status, stable for surgery.  I have reviewed the patient's chart and labs.  Questions were answered to the patient's satisfaction.     Loney Laurence

## 2022-07-07 NOTE — Progress Notes (Signed)
PROGRESS NOTE    [name]  ZJQ:[id number] DOB: [birth year] DOA: 07/05/2022 PCP: [name], MD   Brief Narrative:  66 y.o. 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  On presentation, he was hypoxic with oxygen saturation in the 80s on room air along with tachycardia with creatinine of 2.05, WBC of 23.7.  Chest x-ray showed left-sided pneumonia with large effusion with possibility of empyema.  CTA of the chest showed no pulm embolism but showed possible right lower lobe and right middle lobe atypical infection along with moderate left hydropneumothorax.  Assessment & Plan:   Acute respiratory failure with hypoxia Bilateral community-acquired pneumonia with possibility of aspiration pneumonia as well along with concerns for left-sided empyema Possible left-sided empyema versus hydropneumothorax -Imaging as above.  Currently on Rocephin and Zithromax. -Pulmonary following: Status post pigtail pleural catheter placement by pulmonary on 07/06/2022 -Currently on room air. -Follow cultures  Leukocytosis -Improving.  Monitor  Ongoing dysphagia History of squamous cell carcinoma of tongue status post chemotherapy and radiation -Patient has had ongoing work-up for dysphagia with outpatient barium swallow indicating chronic aspiration and narrowing of esophagus. -SLP following.  ENT evaluation appreciated. -GI also following: Plan for EGD today. -NPO for now.  Chronic kidney disease stage IIIb -Creatinine currently stable.  Monitor.  Anemia of chronic disease -From kidney disease.  Hemoglobin stable.  Monitor  intermittently.  Hyponatremia -Mild.  Monitor  Hypokalemia -Resolved  Thrombocytosis -Possibly reactive.  Monitor  Diabetes mellitus type 2 with hyperglycemia -A1c 9.7.  Continue CBGs with SSI.  Continue long-acting insulin  Hypothyroidism -Continue levothyroxine   DVT prophylaxis: SCDs Code Status: Full Family Communication: Wife at bedside Disposition Plan: Status is: Inpatient Remains inpatient appropriate because: Of severity of  illness   Consultants: Pulmonary/ENT/GI  Procedures: As above  Antimicrobials: Rocephin and Zithromax from 07/05/2022 onwards   Subjective: Patient seen and examined at bedside.  No worsening shortness of breath, fever or vomiting reported.  Still has intermittent cough. Objective: Vitals:   07/07/22 0500 07/07/22 0610 07/07/22 0620 07/07/22 0810  BP:  (!) 147/64 133/73 119/74  Pulse:      Resp:      Temp:    98 F (36.7 C)  TempSrc:      SpO2:      Weight: 83 kg     Height:        Intake/Output Summary (Last 24 hours) at 07/07/2022 0821 Last data filed at 07/07/2022 0730 Gross per 24 hour  Intake 360 ml  Output 1710 ml  Net -1350 ml    Filed Weights   07/06/22 1141 07/06/22 1405 07/07/22 0500  Weight: 85.1 kg 83.5 kg 83 kg    Examination:  General: On 2 L oxygen via nasal cannula.  No distress ENT/neck: No thyromegaly.  JVD is not elevated  respiratory: Decreased breath sounds at bases bilaterally with some crackles; no wheezing.  Intermittently tachypneic.  Left-sided chest tube present  CVS: S1-S2 heard, intermittently tachycardic Abdominal: Soft, nontender, slightly distended; no organomegaly,  bowel sounds are heard Extremities: Trace lower extremity edema; no cyanosis  CNS: Awake and alert.  No focal neurologic deficit.  Moves extremities Lymph: No obvious lymphadenopathy Skin: No obvious ecchymosis/lesions  psych:  Affect, judgment and mood are normal  musculoskeletal: No obvious joint swelling/deformity       Data Reviewed: I have personally reviewed following labs and imaging studies  CBC: Recent Labs  Lab 07/05/22 1808 07/06/22 0157 07/07/22 0318  WBC 23.7* 20.8* 17.3*  NEUTROABS 21.8*  --  15.2*  HGB 12.5* 10.5* 10.9*  HCT 39.8 33.4* 33.3*  MCV 89.2 90.5 87.6  PLT 679* 545* 483*    Basic Metabolic Panel: Recent Labs  Lab 07/05/22 1808 07/06/22 0157 07/07/22 0318  NA 132* 134* 133*  K 4.8 5.2* 4.2  CL 98 102 105  CO2 23 21* 22  GLUCOSE 243* 223* 233*  BUN 37* 34* 33*  CREATININE 2.05* 1.93* 1.85*  CALCIUM 9.4 8.5* 8.3*  MG  --   --  1.9    GFR: Estimated Creatinine Clearance: 44.9 mL/min (A) (by C-G formula based on SCr of 1.85 mg/dL (H)). Liver Function Tests: Recent Labs  Lab 07/05/22 1808  AST 28  ALT 23  ALKPHOS 101  BILITOT 0.5  PROT 7.6  ALBUMIN 3.3*    No results for input(s): "LIPASE", "AMYLASE" in the last 168 hours. No results for input(s): "AMMONIA" in the last 168 hours. Coagulation Profile: Recent Labs  Lab 07/05/22 1808  INR 1.4*    Cardiac Enzymes: No results for input(s): "CKTOTAL", "CKMB", "CKMBINDEX", "TROPONINI" in the last 168 hours. BNP (last 3 results) No results for input(s): "PROBNP" in the last 8760 hours. HbA1C: Recent Labs    07/06/22 0157  HGBA1C 9.7*    CBG: Recent Labs  Lab 07/06/22 0736 07/06/22 1206 07/06/22 1756 07/06/22 2123 07/07/22 0809  GLUCAP 167* 146* 221* 320* 156*    Lipid Profile: No results for input(s): "CHOL", "HDL", "LDLCALC", "TRIG", "CHOLHDL", "LDLDIRECT" in the last 72 hours. Thyroid Function Tests: No results for input(s): "TSH", "T4TOTAL", "FREET4", "T3FREE", "THYROIDAB" in the last 72 hours. Anemia Panel: No results for input(s): "VITAMINB12", "FOLATE", "FERRITIN", "TIBC", "IRON", "RETICCTPCT" in the last 72 hours. Sepsis Labs: Recent Labs  Lab 07/05/22 1808 07/05/22 1945  LATICACIDVEN 1.1 0.6     Recent Results (from the past 240 hour(s))  Blood Culture (routine x 2)      Status: None (Preliminary result)   Collection Time: 07/05/22  6:08 PM   Specimen: BLOOD  Result Value Ref Range Status   Specimen Description   Final    BLOOD LEFT ANTECUBITAL Performed at [facility], [address] [place], [place] [address]    Special Requests   Final    Blood Culture adequate volume BOTTLES DRAWN AEROBIC AND ANAEROBIC Performed at [facility], [address] [place], [place] [address]    Culture   Final    NO GROWTH < 12 HOURS Performed at [facility] [address], [place], [place] [address]    Report Status PENDING  Incomplete  Blood Culture (routine x 2)     Status: None (Preliminary result)   Collection Time: 07/05/22  6:08 PM   Specimen: BLOOD  Result Value Ref Range Status   Specimen Description   Final    BLOOD BLOOD RIGHT FOREARM Performed at [facility], [address], [place], [place] [address]    Special Requests   Final    Blood Culture adequate volume BOTTLES DRAWN AEROBIC AND ANAEROBIC Performed at [facility], [address], [place], [place] [address]    Culture   Final    NO GROWTH < 12 HOURS Performed at  [facility], [place] [address], [place], [place] [address]    Report Status PENDING  Incomplete         Radiology Studies: DG CHEST PORT 1 VIEW  Result Date: 07/06/2022 CLINICAL DATA:  Pleural effusion, chest tube placement EXAM: PORTABLE CHEST 1 VIEW COMPARISON:  Previous studies including the examination of 07/05/2022 FINDINGS: There is interval placement of left chest tube There is no significant change in large left pleural effusion and infiltrate in left lung. There is interval increase in infiltrate in left apical region. No infiltrates are seen in right lung. There is no evidence of apical pneumothorax. IMPRESSION: There is interval placement of left chest tube. There is large loculated left pleural effusion with no  significant change. There is possible increase in infiltrate in the left apical region suggesting worsening of pneumonia. Electronically Signed   By: Elmer Picker M.D.   On: 07/06/2022 16:13   CT Angio Chest PE W and/or Wo Contrast  Result Date: 07/05/2022 CLINICAL DATA:  Pulmonary embolism (PE) suspected, high prob. Cough, fever and chest congestion. Seen by [employer]. Suggested eval for pneumonia. Had "pulled chest muscle" around 06/16/22, hx oc throat CA 2009, rt kidney CA 2021 with chemo/radiation. Pt not a smoker. EXAM: CT ANGIOGRAPHY CHEST WITH CONTRAST TECHNIQUE: Multidetector CT imaging of the chest was performed using the standard protocol during bolus administration of intravenous contrast. Multiplanar CT image reconstructions and MIPs were obtained to evaluate the vascular anatomy. RADIATION DOSE REDUCTION: This exam was performed according to the departmental dose-optimization program which includes automated exposure control, adjustment of the mA and/or kV according to patient size and/or use of iterative reconstruction technique. CONTRAST:  63mL OMNIPAQUE IOHEXOL 350 MG/ML SOLN COMPARISON:  CT chest 06/02/2022 FINDINGS: Cardiovascular: Satisfactory opacification of the pulmonary arteries to the segmental level. No evidence of pulmonary embolism. Normal heart size. No significant pericardial effusion. The thoracic aorta is normal in caliber. No atherosclerotic plaque of the thoracic aorta. Three-vessel coronary artery calcifications. Mediastinum/Nodes: Mediastinal lymphadenopathy: As an example a subcarinal 1.7 cm lymph node (5:121) and a pre-vascular 1 cm lymph node (5:99). Borderline enlarged bilateral hilar lymph nodes: 1 cm (5:109) and 1 cm (5:123). No enlarged axillary lymph nodes. Thyroid gland, trachea, and esophagus demonstrate no significant findings. Lungs/Pleura: Interval worsening of diffuse bronchial wall thickening within bilateral lower lobe. Similar-appearing right lower lobe  metallic densities that may represent previously aspirated material. No focal consolidation. Interval development of right lower lobe and to lesser extent right middle lobe tree-in-bud nodularity. No pulmonary mass. Interval development of an at least moderate volume left pleural effusion with associated several foci of pleural gas. No pneumothorax. Upper Abdomen: No acute abnormality. Musculoskeletal: No chest wall abnormality. No suspicious lytic or blastic osseous lesions. No acute displaced fracture. Multilevel degenerative changes of the spine. Review of the MIP images confirms the above findings. IMPRESSION: 1. No pulmonary embolus. 2. Interval worsening of diffuse bronchial wall thickening with right lower lobe and right middle lobe development of atypical infection. 3. Interval development of an at least moderate volume left hydropneumothorax. 4. Mediastinal and hilar lymphadenopathy. Recommend attention on follow-up. Electronically Signed   By: Iven Finn M.D.   On: 07/05/2022 19:29   DG Chest 2 View  Result Date: 07/05/2022 CLINICAL DATA:  Cough, chest pain, shortness of breath EXAM: CHEST - 2 VIEW COMPARISON:  Previous studies including the examination of 06/18/2022 FINDINGS: There is interval opacification of left mid and left lower lung fields suggesting pneumonia and  possibly pleural effusion. Part of the effusion appears to be loculated along the lateral chest wall. Possibility of empyema is not excluded. There are numerous small metallic densities in the medial right lower lung field, possibly suggesting previous aspiration of barium. This finding has not changed. IMPRESSION: There is interval appearance of large area of opacification in the left mid and left lower lung fields. Findings suggest pneumonia and large effusion. Part of the effusion appears to be loculated along the lateral chest wall. Possibility of empyema is not excluded. Follow-up CT may be considered. Electronically Signed    By: Elmer Picker M.D.   On: 07/05/2022 17:41        Scheduled Meds:  azithromycin  500 mg Oral Daily   insulin aspart  0-15 Units Subcutaneous TID WC   insulin aspart  0-5 Units Subcutaneous QHS   insulin aspart  6 Units Subcutaneous TID WC   insulin detemir  18 Units Subcutaneous QHS   levothyroxine  75 mcg Oral q morning   sodium chloride flush  10 mL Intrapleural Q8H   Continuous Infusions:  cefTRIAXone (ROCEPHIN)  IV 2 g (07/06/22 1104)          Aline August, MD [facility] 07/07/2022, 8:21 AM

## 2022-07-07 NOTE — Progress Notes (Signed)
BRIEF OP NOTE:  EGD completed today for dysphagia x 6-8 months. Regular Z line at 46 cm, no luminal narrowing to suggest esophageal stricture, no esophageal dilatation completed. Small island x 2 of salmon colored mucosa at roughly 20 cm, status post biopsies to r/o Barrett's esophagus. Diffuse gastritis of gastric body and antrum, duodenal bulb inflammation, status post biopsies to r/o H.Pylori. Retained suture material on greater gastric curvature, left undisturbed (per discussion with family patient has had PEG tube in past).  Recommendations: -Omeprazole 20 mg PO BID x 8 weeks, can use pantoprazole 40 mg PO BID while inpatient -Follow up biopsy results from EGD -Ok to resume anticoagulant from GI standpoint -Diet as tolerated, ordered soft diet for now -If symptoms of dysphagia persist, recommend outpatient manometry testing -[facility] will follow  Danton Clap, DO [facility]

## 2022-07-07 NOTE — Anesthesia Procedure Notes (Signed)
Procedure Name: MAC Date/Time: 07/07/2022 12:12 PM  Performed by: Darletta Moll, CRNAPre-anesthesia Checklist: Patient identified, Emergency Drugs available, Suction available and Patient being monitored Patient Re-evaluated:Patient Re-evaluated prior to induction Oxygen Delivery Method: Nasal cannula

## 2022-07-07 NOTE — Congregational Nurse Program (Cosign Needed)
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  Renal cell carcinoma with right nephrectomy 2021 per Dr. Alinda Money.  Ongoing evaluation for dysphagia with barium swallow indicative of chronic aspiration and narrowing of of the esophagus.  Suspect there is a component of aspiration.  Pulmonary critical care asked to evaluate for left effusion and suspected left empyema.  Agree with interim empirical antimicrobial therapy.  Pertinent  Medical History   Past Medical History:  Diagnosis Date   Alopecia    s/p chemotherapy   Arthritis    Bursitis    pt. 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,stg IV,HPV positive   Diabetes mellitus without complication ([place])    takes Amaryl,Januvia,and Metformin,daily   DVT, lower extremity ([place]) 2009   side effect from chemo and has blood clot left leg-wears compression stockings;was on Coumadin for 98months and has been off 5 yrs   History of chemotherapy    Cisplatin/Taxotere,5-FU   History of radiation therapy 01/28/08-03/11/08   left base tongue   History of shingles    Hyperlipidemia    takes Vytorin daily   Hypothyroidism    takes Synthroid daily   Joint pain    Joint swelling    Neuropathy    left foot   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 denies   Right kidney mass    Seborrheic keratosis    multiple on back     Significant Hospital Events: Including procedures, antibiotic start and stop dates in addition to other pertinent events   07/06/2022 left  chest tube 07/07/2022 EGD  Interim History / Subjective:  I CT scan suggestive of empyema pleural effusion  Objective   Blood pressure 113/84, pulse (!) 107, temperature 99.7 F (37.6 C), temperature source Oral, resp. rate (!) 22, height 6\' 3"  (1.905 m), weight 91.4 kg, SpO2 95 %.        Intake/Output Summary (Last 24 hours) at 07/06/2022 0944 Last data filed at 07/06/2022 0844 Gross per 24 hour  Intake 1223 ml  Output 0 ml  Net 1223 ml   Filed Weights   07/05/22 1814  Weight: 91.4 kg    Examination: General: Thin male no acute distress HENT: Oropharynx is unremarkable, no lymphadenopathy is appreciated.  Audible retained fluids and throat  Lungs: Coarse rhonchi left greater than right left chest tube with scant drainage Cardiovascular: Heart sounds are regular Abdomen: Soft positive bowel sounds Extremities: Areas of injury secondary to scratching and healing Neuro: Grossly intact GU: Voids  Resolved Hospital Problem list     Assessment & Plan:  Left-sided pleural effusion suspected empyema in the setting of patient who said squamous cell oral cancer along with renal cell carcinoma with right nephrectomy 2021. Presents with increasing left-sided chest pain following COVID shot on 06/16/2022.  Follow-up work-up demonstrated left effusion and empyema.  He recently had work-up for dysphagia with barium swallow very suggestive of aspiration.  Pulmonary critical care asked to evaluate for pleural effusion left and suspected empyema  07/06/2022 left chest tube placed with 320  cc of fluid obtained. 07/07/2022   80 cc of drainage without airleak No chest x-ray available Continue to monitor Questionable lytic   Esophageal stricture with chronic aspiration GI to scope 11/ 11/2021 with All other issues per primary  Best Practice (right click and "Reselect all SmartList Selections" daily)   Diet/type: NPO DVT prophylaxis: not indicated GI prophylaxis: PPI Lines: N/A Foley:   N/A Code Status:  full code Last date of multidisciplinary goals of care discussion tbd  Labs   CBC: Recent Labs  Lab 07/05/22 1808 07/06/22 0157  WBC 23.7* 20.8*  NEUTROABS 21.8*  --   HGB 12.5* 10.5*  HCT 39.8 33.4*  MCV 89.2 90.5  PLT 679* 545*    Basic Metabolic Panel: Recent Labs  Lab 07/05/22 1808 07/06/22 0157  NA 132* 134*  K 4.8 5.2*  CL 98 102  CO2 23 21*  GLUCOSE 243* 223*  BUN 37* 34*  CREATININE 2.05* 1.93*  CALCIUM 9.4 8.5*   GFR: Estimated Creatinine Clearance: 43.8 mL/min (A) (by C-G formula based on SCr of 1.93 mg/dL (H)). Recent Labs  Lab 07/05/22 1808 07/05/22 1945 07/06/22 0157  WBC 23.7*  --  20.8*  LATICACIDVEN 1.1 0.6  --     Liver Function Tests: Recent Labs  Lab 07/05/22 1808  AST 28  ALT 23  ALKPHOS 101  BILITOT 0.5  PROT 7.6  ALBUMIN 3.3*   No results for input(s): "LIPASE", "AMYLASE" in the last 168 hours. No results for input(s): "AMMONIA" in the last 168 hours.  ABG    Component Value Date/Time   HCO3 17.9 (L) 08/03/2021 1926   TCO2 14 (L) 08/03/2021 1400   ACIDBASEDEF 9.6 (H) 08/03/2021 1926   O2SAT 24.9 08/03/2021 1926     Coagulation Profile: Recent Labs  Lab 07/05/22 1808  INR 1.4*    Cardiac Enzymes: No results for input(s): "CKTOTAL", "CKMB", "CKMBINDEX", "TROPONINI" in the last 168 hours.  HbA1C: Hgb A1c MFr Bld  Date/Time Value Ref Range Status  07/06/2022 01:57 AM 9.7 (H) 4.8 - 5.6 % Final    Comment:    (NOTE) Pre diabetes:          5.7%-6.4%  Diabetes:              >6.4%  Glycemic control for   <7.0% adults with diabetes   08/03/2021 09:03 PM 12.2 (H) 4.8 - 5.6 % Final    Comment:    (NOTE)         Prediabetes: 5.7 - 6.4         Diabetes: >6.4         Glycemic control for adults with diabetes: <7.0     CBG: Recent Labs  Lab 07/06/22 0101 07/06/22 0736  GLUCAP 209* 167*     Critical care time: Ferol Luz Levaeh Vice ACNP Acute Care Nurse Practitioner [place] Please consult [place] 07/06/2022, 9:44 AM

## 2022-07-08 ENCOUNTER — Inpatient Hospital Stay (HOSPITAL_COMMUNITY): Payer: 59

## 2022-07-08 DIAGNOSIS — R1310 Dysphagia, unspecified: Secondary | ICD-10-CM

## 2022-07-08 DIAGNOSIS — J90 Pleural effusion, not elsewhere classified: Secondary | ICD-10-CM | POA: Diagnosis not present

## 2022-07-08 DIAGNOSIS — J189 Pneumonia, unspecified organism: Secondary | ICD-10-CM | POA: Diagnosis not present

## 2022-07-08 LAB — GLUCOSE, CAPILLARY
Glucose-Capillary: 126 mg/dL — ABNORMAL HIGH (ref 70–99)
Glucose-Capillary: 232 mg/dL — ABNORMAL HIGH (ref 70–99)
Glucose-Capillary: 171 mg/dL — ABNORMAL HIGH (ref 70–99)
Glucose-Capillary: 158 mg/dL — ABNORMAL HIGH (ref 70–99)

## 2022-07-08 MED ORDER — SODIUM CHLORIDE (PF) 0.9 % IJ SOLN
10.0000 mg | Freq: Once | INTRAMUSCULAR | Status: AC
  Administered 2022-07-08: 10 mg via INTRAPLEURAL
  Filled 2022-07-08: qty 10

## 2022-07-08 MED ORDER — STERILE WATER FOR INJECTION IJ SOLN
5.0000 mg | Freq: Once | RESPIRATORY_TRACT | Status: AC
  Administered 2022-07-08: 5 mg via INTRAPLEURAL
  Filled 2022-07-08: qty 5

## 2022-07-08 MED ORDER — SODIUM CHLORIDE 0.9% FLUSH
10.0000 mL | Freq: Three times a day (TID) | INTRAVENOUS | Status: DC
  Administered 2022-07-09 – 2022-07-10 (×3): 10 mL via INTRAPLEURAL

## 2022-07-08 NOTE — Progress Notes (Signed)
Left chest tube dressing changed per order, pt tolerated well.  Pt had no drainage this shift. 02 titrated to 0.5 via [place].  02 sats 97%

## 2022-07-08 NOTE — Plan of Care (Signed)

## 2022-07-08 NOTE — Progress Notes (Signed)
Subjective: Patient states he had eggs and soft diet for breakfast without any difficulty swallowing.  Objective: Vital signs in last 24 hours: Temp:  [98.1 F (36.7 C)-98.3 F (36.8 C)] 98.1 F (36.7 C) (11/04 0904) Pulse Rate:  [96-107] 107 (11/04 0356) Resp:  [15-23] 20 (11/04 0356) BP: (105-167)/(66-81) 132/67 (11/04 0904) SpO2:  [94 %-96 %] 94 % (11/04 0356) Weight:  [83.1 kg] 83.1 kg (11/04 0500) Weight change: -1.995 kg Last BM Date : 07/04/22  PE: On oxygen via nasal cannula saturating 92% GENERAL: Able to speak in few words, not using accessory muscles of respiration  ABDOMEN: Soft, nondistended, nontender EXTREMITIES: No deformity  Lab Results: Results for orders placed or performed during the hospital encounter of 07/05/22 (from the past 48 hour(s))  Protein, pleural or peritoneal fluid     Status: None   Collection Time: 07/06/22  4:00 PM  Result Value Ref Range   Total protein, fluid 4.0 g/dL    Comment: (NOTE) No normal range established for this test Results should be evaluated in conjunction with serum values    Fluid Type-FTP PLEURAL     Comment: LEFT FLUID Performed at [facility], [address] [place], [place] [address] CORRECTED ON 11/02 AT 1701: PREVIOUSLY REPORTED AS Pleural, L   Lactate dehydrogenase (pleural or peritoneal fluid)     Status: Abnormal   Collection Time: 07/06/22  4:00 PM  Result Value Ref Range   LD, Fluid 234 (H) 3 - 23 U/L    Comment: (NOTE) Results should be evaluated in conjunction with serum values    Fluid Type-FLDH PLEURAL     Comment: LEFT FLUID Performed at [facility] [address] [place], [place] [address] CORRECTED ON 11/02 AT 1701: PREVIOUSLY REPORTED AS Pleural, L   Body fluid cell count with differential     Status: Abnormal   Collection Time: 07/06/22  4:00 PM  Result Value Ref Range   Fluid Type-FCT PLEURAL     Comment: LEFT FLUID    Color, Fluid AMBER (A) YELLOW   Appearance, Fluid  CLOUDY (A) CLEAR   Total Nucleated Cell Count, Fluid 4,581 (H) 0 - 1,000 cu mm   Neutrophil Count, Fluid 87 (H) 0 - 25 %   Lymphs, Fluid 9 %   Monocyte-Macrophage-Serous Fluid 4 (L) 50 - 90 %   Eos, Fluid 0 %    Comment: Performed at [facility] [address], [place], [place] [address]  Glucose, pleural or peritoneal fluid     Status: None   Collection Time: 07/06/22  4:00 PM  Result Value Ref Range   Glucose, Fluid 136 mg/dL    Comment: (NOTE) No normal range established for this test Results should be evaluated in conjunction with serum values    Fluid Type-FGLU PLEURAL     Comment: LEFT FLUID Performed at [facility] [address] [place], [place] [address] CORRECTED ON 11/02 AT 1701: PREVIOUSLY REPORTED AS Pleural, L   Pathologist smear review     Status: None   Collection Time: 07/06/22  4:00 PM  Result Value Ref Range   Path Review      THERE ARE NO MALIGNANT CELLS. NORMAL MESOTHELIAL CELLS AND A LARGE NUMBER OF NEUTROPHILS ARE PRESENT. CONSISTENT WITH ACUTE INFLAMMATORY PROCESS    Comment: Reviewed by Unknown Jim, M.D. 07/07/2022 Performed at [facility], [place] [address], [place], [place] [address]   Body fluid culture w Gram Stain  Status: None (Preliminary result)   Collection Time: 07/06/22  4:02 PM   Specimen: Pleura; Body Fluid  Result Value Ref Range   Specimen Description PLEURAL    Special Requests LEFT    Gram Stain NO WBC SEEN NO ORGANISMS SEEN     Culture      NO GROWTH < 24 HOURS Performed at [facility], [address], [place], [place] [address]    Report Status PENDING   Glucose, capillary     Status: Abnormal   Collection Time: 07/06/22  5:56 PM  Result Value Ref Range   Glucose-Capillary 221 (H) 70 - 99 mg/dL    Comment: Glucose reference range applies only to samples taken after fasting for at least 8 hours.  Glucose, capillary     Status: Abnormal   Collection Time: 07/06/22  9:23 PM  Result Value Ref  Range   Glucose-Capillary 320 (H) 70 - 99 mg/dL    Comment: Glucose reference range applies only to samples taken after fasting for at least 8 hours.  CBC with Differential/Platelet     Status: Abnormal   Collection Time: 07/07/22  3:18 AM  Result Value Ref Range   WBC 17.3 (H) 4.0 - 10.5 K/uL   RBC 3.80 (L) 4.22 - 5.81 MIL/uL   Hemoglobin 10.9 (L) 13.0 - 17.0 g/dL   HCT 33.3 (L) 39.0 - 52.0 %   MCV 87.6 80.0 - 100.0 fL   MCH 28.7 26.0 - 34.0 pg   MCHC 32.7 30.0 - 36.0 g/dL   RDW 13.2 11.5 - 15.5 %   Platelets 483 (H) 150 - 400 K/uL   nRBC 0.0 0.0 - 0.2 %   Neutrophils Relative % 88 %   Neutro Abs 15.2 (H) 1.7 - 7.7 K/uL   Lymphocytes Relative 4 %   Lymphs Abs 0.7 0.7 - 4.0 K/uL   Monocytes Relative 7 %   Monocytes Absolute 1.2 (H) 0.1 - 1.0 K/uL   Eosinophils Relative 0 %   Eosinophils Absolute 0.0 0.0 - 0.5 K/uL   Basophils Relative 0 %   Basophils Absolute 0.0 0.0 - 0.1 K/uL   Immature Granulocytes 1 %   Abs Immature Granulocytes 0.15 (H) 0.00 - 0.07 K/uL    Comment: Performed at [facility], [address], [place], [place] [address]  Basic metabolic panel     Status: Abnormal   Collection Time: 07/07/22  3:18 AM  Result Value Ref Range   Sodium 133 (L) 135 - 145 mmol/L   Potassium 4.2 3.5 - 5.1 mmol/L   Chloride 105 98 - 111 mmol/L   CO2 22 22 - 32 mmol/L   Glucose, Bld 233 (H) 70 - 99 mg/dL    Comment: Glucose reference range applies only to samples taken after fasting for at least 8 hours.   BUN 33 (H) 8 - 23 mg/dL   Creatinine, Ser 1.85 (H) 0.61 - 1.24 mg/dL   Calcium 8.3 (L) 8.9 - 10.3 mg/dL   GFR, Estimated 39 (L) >60 mL/min    Comment: (NOTE) Calculated using the CKD-EPI Creatinine Equation (2021)    Anion gap 6 5 - 15    Comment: Performed at [facility] [address] [place], [place] [address]  Magnesium     Status: None   Collection Time: 07/07/22  3:18 AM  Result Value Ref Range   Magnesium 1.9 1.7 - 2.4 mg/dL    Comment: Performed at  [facility],  [address] [place], [place] [address]  Glucose, capillary     Status: Abnormal   Collection Time: 07/07/22  8:09 AM  Result Value Ref Range   Glucose-Capillary 156 (H) 70 - 99 mg/dL    Comment: Glucose reference range applies only to samples taken after fasting for at least 8 hours.  Glucose, capillary     Status: Abnormal   Collection Time: 07/07/22  4:00 PM  Result Value Ref Range   Glucose-Capillary 135 (H) 70 - 99 mg/dL    Comment: Glucose reference range applies only to samples taken after fasting for at least 8 hours.  Glucose, capillary     Status: Abnormal   Collection Time: 07/07/22  7:40 PM  Result Value Ref Range   Glucose-Capillary 260 (H) 70 - 99 mg/dL    Comment: Glucose reference range applies only to samples taken after fasting for at least 8 hours.  Glucose, capillary     Status: Abnormal   Collection Time: 07/08/22  8:25 AM  Result Value Ref Range   Glucose-Capillary 126 (H) 70 - 99 mg/dL    Comment: Glucose reference range applies only to samples taken after fasting for at least 8 hours.  Glucose, capillary     Status: Abnormal   Collection Time: 07/08/22 12:22 PM  Result Value Ref Range   Glucose-Capillary 232 (H) 70 - 99 mg/dL    Comment: Glucose reference range applies only to samples taken after fasting for at least 8 hours.    Studies/Results: DG CHEST PORT 1 VIEW  Result Date: 07/06/2022 CLINICAL DATA:  Pleural effusion, chest tube placement EXAM: PORTABLE CHEST 1 VIEW COMPARISON:  Previous studies including the examination of 07/05/2022 FINDINGS: There is interval placement of left chest tube There is no significant change in large left pleural effusion and infiltrate in left lung. There is interval increase in infiltrate in left apical region. No infiltrates are seen in right lung. There is no evidence of apical pneumothorax. IMPRESSION: There is interval placement of left chest tube. There is large loculated left pleural effusion  with no significant change. There is possible increase in infiltrate in the left apical region suggesting worsening of pneumonia. Electronically Signed   By: Elmer Picker M.D.   On: 07/06/2022 16:13    Medications: I have reviewed the patient's current medications.  Assessment: Dysphagia, status post EGD without obvious stricture or narrowing, biopsies taken to rule out Barrett's esophagus and H. pylori  Plan: As detailed by Dr. Randel Pigg, omeprazole 20 mg twice a day for 8 weeks, can use pantoprazole 40 mg p.o. twice a day while inpatient. Tolerating soft diet. If dysphagia recurs, outpatient manometry recommended. GI will sign off, biopsies can be followed as outpatient.  Ronnette Juniper, MD 07/08/2022, 3:33 PM

## 2022-07-08 NOTE — Progress Notes (Signed)
PROGRESS NOTE    TERRION GENCARELLI  UYQ:[id number] DOB: 11-03-1955 DOA: 07/05/2022 PCP: [name], MD   Brief Narrative:  66 y.o. 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  On presentation, he was hypoxic with oxygen saturation in the 80s on room air along with tachycardia with creatinine of 2.05, WBC of 23.7.  Chest x-ray showed left-sided pneumonia with large effusion with possibility of empyema.  CTA of the chest showed no pulm embolism but showed possible right lower lobe and right middle lobe atypical infection along with moderate left hydropneumothorax.  Pulmonary was consulted and patient underwent pigtail catheter placement on 07/06/2022.  GI consulted for dysphagia: Underwent EGD on 07/07/2022.  Assessment & Plan:   Acute respiratory failure with hypoxia Bilateral community-acquired pneumonia with possibility of aspiration pneumonia as well along with concerns for left-sided empyema Possible left-sided empyema versus hydropneumothorax -Imaging as above.  Currently on Rocephin, Zithromax and Flagyl. -Pulmonary following: Status post pigtail pleural catheter placement by pulmonary on 07/06/2022.  Followed further recommendations. -Currently on 0.5 L oxygen via nasal cannula. -Follow cultures  Leukocytosis -Improving.  No labs today.  Repeat a.m. labs.  Ongoing dysphagia History of squamous cell carcinoma of tongue status post chemotherapy and radiation -Patient has had ongoing work-up for dysphagia with outpatient barium swallow indicating chronic aspiration and narrowing of esophagus. -SLP following.  ENT evaluation  appreciated. -Status post EGD on 07/07/2022: Showed gastritis/duodenitis: Biopsied.  GI recommends PPI twice daily for 8 weeks and cleared the patient to resume anticoagulation.  Soft diet has been ordered.  Patient will need outpatient manometry testing if symptoms of dysphagia persist  Chronic kidney disease stage IIIb -Creatinine currently stable.  Monitor.  Anemia of chronic disease -From kidney disease.  Hemoglobin stable.  Monitor intermittently.  Hyponatremia -No labs today  Hypokalemia -Resolved  Thrombocytosis -Possibly reactive.  Monitor  Diabetes mellitus type 2 with hyperglycemia -A1c 9.7.  Continue CBGs with SSI.  Continue long-acting insulin  Hypothyroidism -Continue levothyroxine   DVT prophylaxis: SCDs Code Status: Full Family Communication: Wife at bedside Disposition Plan: Status is: Inpatient Remains inpatient appropriate because: Of severity of  illness   Consultants: Pulmonary/ENT/GI  Procedures: Left-sided chest tube placement on 07/06/2022 EGD on 07/07/2022 Impression:               - Z-line regular, 47 cm from the incisors. No                            specimens collected.                           - Salmon-colored mucosa. Biopsied.                           - A suture was found in the stomach.                           - Gastritis. Biopsied.                           -  Duodenitis. No specimens collected.                           - Normal first portion of the duodenum and second                            portion of the duodenum. No specimens collected. Recommendation:           - Return patient to hospital ward for ongoing care.                           - Resume previous diet indefinitely.                           - Await pathology results.                           - Use Prilosec (omeprazole) 20 mg PO BID for 8                            weeks.                           - Perform ambulatory esophageal manometry at                             appointment to be scheduled.  Antimicrobials: Rocephin and Zithromax from 07/05/2022 onwards.  Flagyl from 07/07/2022 onwards   Subjective: Patient seen and examined at bedside.  No fever, vomiting, worsening shortness of breath reported.   Objective: Vitals:   07/07/22 2326 07/07/22 2333 07/08/22 0356 07/08/22 0500  BP: (!) 167/81 (!) 167/81 123/75   Pulse:  96 (!) 107   Resp: 15 15 20    Temp: 98.3 F (36.8 C) 98.3 F (36.8 C) 98.1 F (36.7 C)   TempSrc: Oral  Oral   SpO2: 95%  94%   Weight:    83.1 kg  Height:        Intake/Output Summary (Last 24 hours) at 07/08/2022 0811 Last data filed at 07/08/2022 0600 Gross per 24 hour  Intake 300 ml  Output 650 ml  Net -350 ml    Filed Weights   07/06/22 1405 07/07/22 0500 07/08/22 0500  Weight: 83.5 kg 83 kg 83.1 kg    Examination:  General: No distress.  Intermittently requiring 0.5 L oxygen via nasal cannula. ENT/neck: No obvious JVD elevation or palpable thyromegaly  respiratory: Bilateral decreased breath sounds at bases with some crackles.  Left-sided chest tube present  CVS: Mild intermittent tachycardia present.  S1 and S2 are heard.   Abdominal: Soft, nontender, distended slightly; no organomegaly, normal bowel sounds heard  extremities: No clubbing; mild lower extremity edema present  CNS: Alert and oriented.  No focal neurologic deficit.  Moving extremities Lymph: No obvious palpable lymphadenopathy noted Skin: No obvious rashes/petechiae psych: Affect is mostly flat.  Not agitated.   Musculoskeletal: No obvious joint erythema/tenderness      Data Reviewed: I have personally reviewed following labs and imaging studies  CBC: Recent Labs  Lab 07/05/22 1808 07/06/22 0157 07/07/22 0318  WBC 23.7* 20.8* 17.3*  NEUTROABS 21.8*  --  15.2*  HGB 12.5* 10.5* 10.9*  HCT 39.8 33.4* 33.3*  MCV 89.2 90.5 87.6  PLT 679* 545* 483*    Basic Metabolic Panel: Recent Labs  Lab 07/05/22 1808 07/06/22 0157  07/07/22 0318  NA 132* 134* 133*  K 4.8 5.2* 4.2  CL 98 102 105  CO2 23 21* 22  GLUCOSE 243* 223* 233*  BUN 37* 34* 33*  CREATININE 2.05* 1.93* 1.85*  CALCIUM 9.4 8.5* 8.3*  MG  --   --  1.9    GFR: Estimated Creatinine Clearance: 44.9 mL/min (A) (by C-G formula based on SCr of 1.85 mg/dL (H)). Liver Function Tests: Recent Labs  Lab 07/05/22 1808  AST 28  ALT 23  ALKPHOS 101  BILITOT 0.5  PROT 7.6  ALBUMIN 3.3*    No results for input(s): "LIPASE", "AMYLASE" in the last 168 hours. No results for input(s): "AMMONIA" in the last 168 hours. Coagulation Profile: Recent Labs  Lab 07/05/22 1808  INR 1.4*    Cardiac Enzymes: No results for input(s): "CKTOTAL", "CKMB", "CKMBINDEX", "TROPONINI" in the last 168 hours. BNP (last 3 results) No results for input(s): "PROBNP" in the last 8760 hours. HbA1C: Recent Labs    07/06/22 0157  HGBA1C 9.7*    CBG: Recent Labs  Lab 07/06/22 1756 07/06/22 2123 07/07/22 0809 07/07/22 1600 07/07/22 1940  GLUCAP 221* 320* 156* 135* 260*    Lipid Profile: No results for input(s): "CHOL", "HDL", "LDLCALC", "TRIG", "CHOLHDL", "LDLDIRECT" in the last 72 hours. Thyroid Function Tests: No results for input(s): "TSH", "T4TOTAL", "FREET4", "T3FREE", "THYROIDAB" in the last 72 hours. Anemia Panel: No results for input(s): "VITAMINB12", "FOLATE", "FERRITIN", "TIBC", "IRON", "RETICCTPCT" in the last 72 hours. Sepsis Labs: Recent Labs  Lab 07/05/22 1808 07/05/22 1945  LATICACIDVEN 1.1 0.6     Recent Results (from the past 240 hour(s))  Blood Culture (routine x 2)     Status: None (Preliminary result)   Collection Time: 07/05/22  6:08 PM   Specimen: BLOOD  Result Value Ref Range Status   Specimen Description   Final    BLOOD LEFT ANTECUBITAL Performed at [facility], [address] [place], [place] [address]    Special Requests   Final    Blood Culture adequate volume BOTTLES DRAWN AEROBIC AND  ANAEROBIC Performed at [facility], [address] [place], [place] [address]    Culture   Final    NO GROWTH 3 DAYS Performed at [facility], [place] [address], [place], [place] [address]    Report Status PENDING  Incomplete  Blood Culture (routine x 2)     Status: None (Preliminary result)   Collection Time: 07/05/22  6:08 PM   Specimen: BLOOD  Result Value Ref Range Status   Specimen Description   Final    BLOOD BLOOD RIGHT FOREARM Performed at [facility], [address], [place], [place] [address]    Special Requests   Final    Blood Culture adequate volume BOTTLES DRAWN AEROBIC AND ANAEROBIC Performed at [facility], [address], [place], [place] [address]    Culture   Final    NO GROWTH 3 DAYS Performed at [facility], [place] [address] [place], [place] [address]    Report Status PENDING  Incomplete  Body fluid culture w Gram Stain     Status: None (Preliminary result)   Collection Time: 07/06/22  4:02 PM   Specimen: Pleura; Body Fluid  Result Value Ref Range Status   Specimen Description PLEURAL  Final   Special Requests LEFT  Final   Gram Stain   Final    NO WBC SEEN NO ORGANISMS SEEN Performed at [facility], [place] [address] [place], [place] [address]    Culture PENDING  Incomplete   Report Status PENDING  Incomplete         Radiology Studies: DG CHEST PORT 1 VIEW  Result Date: 07/06/2022 CLINICAL DATA:  Pleural effusion, chest tube placement EXAM: PORTABLE CHEST 1 VIEW COMPARISON:  Previous studies including the examination of 07/05/2022 FINDINGS: There is interval placement of left chest tube There is no significant change in large left pleural effusion and infiltrate in left lung. There is interval increase in infiltrate in left apical region. No infiltrates are seen in right lung. There is no evidence of apical pneumothorax. IMPRESSION: There is interval placement of left  chest tube. There is large loculated left pleural effusion with no significant change. There is possible increase in infiltrate in the left apical region suggesting worsening of pneumonia. Electronically Signed   By: Elmer Picker M.D.   On: 07/06/2022 16:13        Scheduled Meds:  enoxaparin (LOVENOX) injection  1 mg/kg Subcutaneous Q12H   insulin aspart  0-15 Units Subcutaneous TID WC   insulin aspart  0-5 Units Subcutaneous QHS   insulin aspart  6 Units Subcutaneous TID WC   insulin detemir  18 Units Subcutaneous QHS   levothyroxine  75 mcg Oral q morning   mouth rinse  15 mL Mouth Rinse 4 times per day   pantoprazole  40 mg Oral BID   sodium chloride flush  10 mL Intrapleural Q8H   Continuous Infusions:  azithromycin 500 mg (07/07/22 1529)   cefTRIAXone (ROCEPHIN)  IV Stopped (07/07/22 1000)   metronidazole 500 mg (07/08/22 0322)          Aline August, MD [facility] 07/08/2022, 8:11 AM

## 2022-07-08 NOTE — Progress Notes (Signed)
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  Renal cell carcinoma with right nephrectomy 2021 per Dr. Alinda Money.  Ongoing evaluation for dysphagia with barium swallow indicative of chronic aspiration and narrowing of of the esophagus.  Suspect there is a component of aspiration.  Pulmonary critical care asked to evaluate for left effusion and suspected left empyema.  Agree with interim empirical antimicrobial therapy.  Pertinent  Medical History   Past Medical History:  Diagnosis Date   Alopecia    s/p chemotherapy   Arthritis    Bursitis    pt. denies   Cancer ([place]) 10/2007    tongue/squamous cell,stg IV,HPV positive   Diabetes mellitus without complication ([place])    takes Amaryl,Januvia,and Metformin,daily   DVT, lower extremity ([place]) 2009   side effect from chemo and has blood clot left leg-wears compression stockings;was on Coumadin for 91months and has been off 5 yrs   History of chemotherapy    Cisplatin/Taxotere,5-FU   History of radiation therapy 01/28/08-03/11/08   left base tongue   History of shingles    Hyperlipidemia    takes Vytorin daily   Hypothyroidism    takes Synthroid daily   Joint pain    Joint swelling    Neuropathy    left foot   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 denies   Right kidney mass    Seborrheic keratosis    multiple on back     Significant Hospital Events: Including procedures, antibiotic start and stop dates in addition to other pertinent events   07/06/2022 left  chest tube 07/07/2022 EGD  Interim History / Subjective:  Afebrile. Only 400cc output total since chest tube placed.   Objective   Blood pressure 132/67, pulse (!) 107, temperature 98.1 F (36.7 C), temperature source Oral, resp. rate 20, height 6\' 3"  (1.905 m), weight 83.1 kg, SpO2 94 %.        Intake/Output Summary (Last 24 hours) at 07/08/2022 1603 Last data filed at 07/08/2022 1300 Gross per 24 hour  Intake 868.75 ml  Output 750 ml  Net 118.75 ml   Filed Weights   07/06/22 1405 07/07/22 0500 07/08/22 0500  Weight: 83.5 kg 83 kg 83.1 kg    Examination: General appearance: 66 y.o., male, NAD, conversant  Eyes: PERRL, tracking appropriately HENT: NCAT; MMM Lungs: very rhonchorous on right and diminished on left, with normal respiratory effort CV: tachy RR, no murmur  Abdomen: Soft, non-tender; non-distended, BS present  Extremities: No peripheral edema, warm Neuro: grossly nonfocal  CXR with large left effusion  WBC 17 on 11/3   Resolved Hospital Problem list     Assessment & Plan:   Suspected left complicated parapneumonic effusion s/p pigtail placement 11/2 - keep to suction -20 - flush bid with saline - lytics today day 1   Esophageal stricture with chronic aspiration GI to scope 11/ 11/2021 with All other issues per primary  Best Practice (right click and "Reselect all SmartList Selections" daily)   Diet/type: NPO DVT prophylaxis: not indicated GI prophylaxis: PPI Lines: N/A Foley:  N/A Code Status:  full code Last date of multidisciplinary goals of care discussion tbd  Labs   CBC: Recent Labs  Lab 07/05/22 1808 07/06/22 0157 07/07/22 0318  WBC 23.7* 20.8* 17.3*  NEUTROABS 21.8*  --  15.2*  HGB 12.5* 10.5* 10.9*  HCT 39.8 33.4* 33.3*  MCV 89.2 90.5 87.6  PLT 679* 545* 483*    Basic Metabolic Panel: Recent Labs  Lab 07/05/22 1808 07/06/22 0157 07/07/22 0318  NA 132* 134* 133*  K 4.8 5.2* 4.2  CL 98 102 105  CO2 23 21* 22   GLUCOSE 243* 223* 233*  BUN 37* 34* 33*  CREATININE 2.05* 1.93* 1.85*  CALCIUM 9.4 8.5* 8.3*  MG  --   --  1.9   GFR: Estimated Creatinine Clearance: 44.9 mL/min (A) (by C-G formula based on SCr of 1.85 mg/dL (H)). Recent Labs  Lab 07/05/22 1808 07/05/22 1945 07/06/22 0157 07/07/22 0318  WBC 23.7*  --  20.8* 17.3*  LATICACIDVEN 1.1 0.6  --   --     Liver Function Tests: Recent Labs  Lab 07/05/22 1808  AST 28  ALT 23  ALKPHOS 101  BILITOT 0.5  PROT 7.6  ALBUMIN 3.3*   No results for input(s): "LIPASE", "AMYLASE" in the last 168 hours. No results for input(s): "AMMONIA" in the last 168 hours.  ABG    Component Value Date/Time   HCO3 17.9 (L) 08/03/2021 1926   TCO2 14 (L) 08/03/2021 1400   ACIDBASEDEF 9.6 (H) 08/03/2021 1926   O2SAT 24.9 08/03/2021 1926     Coagulation Profile: Recent Labs  Lab 07/05/22 1808  INR 1.4*    Cardiac Enzymes: No results for input(s): "CKTOTAL", "CKMB", "CKMBINDEX", "TROPONINI" in the last 168 hours.  HbA1C: Hgb A1c MFr Bld  Date/Time Value Ref Range Status  07/06/2022 01:57 AM 9.7 (H) 4.8 - 5.6 % Final    Comment:    (NOTE) Pre diabetes:          5.7%-6.4%  Diabetes:              >6.4%  Glycemic control for   <7.0% adults with diabetes   08/03/2021 09:03 PM 12.2 (H) 4.8 - 5.6 % Final    Comment:    (NOTE)         Prediabetes: 5.7 - 6.4         Diabetes: >6.4         Glycemic control for adults with diabetes: <7.0     CBG: Recent Labs  Lab 07/07/22 0809 07/07/22 1600 07/07/22 1940 07/08/22 0825 07/08/22 1222  GLUCAP 156* 135* 260* 126* 232*     Critical care time: na     Fredirick [facility] Pul[facility]/Critical Care  Please consult Amion 07/08/2022, 4:03 PM

## 2022-07-08 NOTE — Procedures (Signed)
Pleural Fibrinolytic Administration Procedure Note  [name]  [id number]  1955-12-07  Date:07/08/22  Time:4:56 PM   Provider Performing:Jayceion Lisenby Jerilynn Mages Verlee Monte   Procedure: Pleural Fibrinolysis Initial day [phone])  Indication(s) Fibrinolysis of complicated pleural effusion  Consent Risks of the procedure as well as the alternatives and risks of each were explained to the patient and/or caregiver.  Consent for the procedure was obtained.   Anesthesia None   Time Out Verified patient identification, verified procedure, site/side was marked, verified correct patient position, special equipment/implants available, medications/allergies/relevant history reviewed, required imaging and test results available.   Sterile Technique Hand hygiene, gloves   Procedure Description Existing pleural catheter was cleaned and accessed in sterile manner.  10mg  of tPA in 30cc of saline and 5mg  of dornase in 30cc of sterile water were injected into pleural space using existing pleural catheter.  Catheter will be clamped for 1 hour and then placed back to suction.   Complications/Tolerance None; patient tolerated the procedure well.  EBL None   Specimen(s) None

## 2022-07-09 ENCOUNTER — Inpatient Hospital Stay (HOSPITAL_COMMUNITY): Payer: 59

## 2022-07-09 ENCOUNTER — Encounter (HOSPITAL_COMMUNITY): Payer: Self-pay | Admitting: Emergency Medicine

## 2022-07-09 DIAGNOSIS — J189 Pneumonia, unspecified organism: Secondary | ICD-10-CM | POA: Diagnosis not present

## 2022-07-09 DIAGNOSIS — J90 Pleural effusion, not elsewhere classified: Secondary | ICD-10-CM | POA: Diagnosis not present

## 2022-07-09 LAB — BASIC METABOLIC PANEL
Anion gap: 6 (ref 5–15)
BUN: 19 mg/dL (ref 8–23)
CO2: 24 mmol/L (ref 22–32)
Calcium: 8.4 mg/dL — ABNORMAL LOW (ref 8.9–10.3)
Chloride: 106 mmol/L (ref 98–111)
Creatinine, Ser: 1.65 mg/dL — ABNORMAL HIGH (ref 0.61–1.24)
GFR, Estimated: 45 mL/min — ABNORMAL LOW (ref >60)
Glucose, Bld: 160 mg/dL — ABNORMAL HIGH (ref 70–99)
Potassium: 4.6 mmol/L (ref 3.5–5.1)
Sodium: 136 mmol/L (ref 135–145)

## 2022-07-09 LAB — CBC WITH DIFFERENTIAL/PLATELET
Abs Immature Granulocytes: 0.1 10*3/uL — ABNORMAL HIGH (ref 0.00–0.07)
Basophils Absolute: 0 10*3/uL (ref 0.0–0.1)
Basophils Relative: 0 %
Eosinophils Absolute: 0 10*3/uL (ref 0.0–0.5)
Eosinophils Relative: 0 %
HCT: 34.1 % — ABNORMAL LOW (ref 39.0–52.0)
Hemoglobin: 10.5 g/dL — ABNORMAL LOW (ref 13.0–17.0)
Immature Granulocytes: 1 %
Lymphocytes Relative: 5 %
Lymphs Abs: 0.9 10*3/uL (ref 0.7–4.0)
MCH: 28 pg (ref 26.0–34.0)
MCHC: 30.8 g/dL (ref 30.0–36.0)
MCV: 90.9 fL (ref 80.0–100.0)
Monocytes Absolute: 1.2 10*3/uL — ABNORMAL HIGH (ref 0.1–1.0)
Monocytes Relative: 7 %
Neutro Abs: 15.8 10*3/uL — ABNORMAL HIGH (ref 1.7–7.7)
Neutrophils Relative %: 87 %
Platelets: 464 10*3/uL — ABNORMAL HIGH (ref 150–400)
RBC: 3.75 MIL/uL — ABNORMAL LOW (ref 4.22–5.81)
RDW: 13.3 % (ref 11.5–15.5)
WBC: 18 10*3/uL — ABNORMAL HIGH (ref 4.0–10.5)
nRBC: 0 % (ref 0.0–0.2)

## 2022-07-09 LAB — GLUCOSE, CAPILLARY
Glucose-Capillary: 140 mg/dL — ABNORMAL HIGH (ref 70–99)
Glucose-Capillary: 176 mg/dL — ABNORMAL HIGH (ref 70–99)
Glucose-Capillary: 208 mg/dL — ABNORMAL HIGH (ref 70–99)
Glucose-Capillary: 248 mg/dL — ABNORMAL HIGH (ref 70–99)

## 2022-07-09 LAB — CHOLESTEROL, BODY FLUID: Cholesterol, Fluid: 54 mg/dL

## 2022-07-09 LAB — MAGNESIUM: Magnesium: 1.9 mg/dL (ref 1.7–2.4)

## 2022-07-09 MED ORDER — STERILE WATER FOR INJECTION IJ SOLN
5.0000 mg | Freq: Once | RESPIRATORY_TRACT | Status: AC
  Administered 2022-07-09: 5 mg via INTRAPLEURAL
  Filled 2022-07-09: qty 5

## 2022-07-09 MED ORDER — SODIUM CHLORIDE 0.9% FLUSH
10.0000 mL | Freq: Three times a day (TID) | INTRAVENOUS | Status: DC
  Administered 2022-07-09 – 2022-07-10 (×4): 10 mL via INTRAPLEURAL

## 2022-07-09 MED ORDER — SODIUM CHLORIDE (PF) 0.9 % IJ SOLN
10.0000 mg | Freq: Once | INTRAMUSCULAR | Status: AC
  Administered 2022-07-09: 10 mg via INTRAPLEURAL
  Filled 2022-07-09: qty 10

## 2022-07-09 NOTE — Progress Notes (Addendum)
PROGRESS NOTE    [name]  DXI:[id number] DOB: 01/27/1956 DOA: 07/05/2022 PCP: [name], MD   Brief Narrative:  66 y.o. 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  On presentation, he was hypoxic with oxygen saturation in the 80s on room air along with tachycardia with creatinine of 2.05, WBC of 23.7.  Chest x-ray showed left-sided pneumonia with large effusion with possibility of empyema.  CTA of the chest showed no pulm embolism but showed possible right lower lobe and right middle lobe atypical infection along with moderate left hydropneumothorax.  Pulmonary was consulted and patient underwent pigtail catheter placement on 07/06/2022.  GI consulted for dysphagia: Underwent EGD on 07/07/2022.  Assessment & Plan:   Acute respiratory failure with hypoxia Bilateral community-acquired pneumonia with possibility of aspiration pneumonia as well along with concerns for left-sided empyema Possible left-sided empyema versus hydropneumothorax -Imaging as above.  Currently on Rocephin, Zithromax and Flagyl. -Pulmonary following: Status post pigtail pleural catheter placement by pulmonary on 07/06/2022.  Followed further recommendations.  Underwent pleural fibrinolysis on 07/08/2022 by pulmonary. -Currently still on 0.5 L oxygen via nasal cannula. -Follow cultures  Leukocytosis -Still significant.  Repeat a.m. labs.  Ongoing dysphagia History of squamous cell carcinoma of tongue status post chemotherapy and radiation -Patient has had ongoing work-up for dysphagia with outpatient barium swallow indicating chronic aspiration and  narrowing of esophagus. -SLP following.  ENT evaluation appreciated. -Status post EGD on 07/07/2022: Showed gastritis/duodenitis: Biopsied.  GI recommends PPI twice daily for 8 weeks and cleared the patient to resume anticoagulation.  Soft diet has been ordered.  Patient will need outpatient manometry testing if symptoms of dysphagia persist.  GI signed off on 07/08/2022.  Chronic kidney disease stage IIIb -Creatinine currently stable.  Monitor.  Anemia of chronic disease -From kidney disease.  Hemoglobin stable.  Monitor intermittently.  Hyponatremia -Resolved  Hypokalemia -Resolved  Thrombocytosis -Possibly reactive.  Monitor  Diabetes mellitus type 2 with hyperglycemia -A1c 9.7.  Continue CBGs with SSI.  Continue long-acting insulin  Hypothyroidism -Continue levothyroxine   DVT prophylaxis: SCDs Code Status: Full Family Communication: Wife at bedside Disposition Plan: Status is: Inpatient Remains inpatient appropriate because: Of severity of  illness   Consultants: Pulmonary/ENT/GI  Procedures: Left-sided chest tube placement on 07/06/2022 EGD on 07/07/2022 Impression:               - Z-line regular, 47 cm from the incisors. No                            specimens collected.                           - Salmon-colored mucosa. Biopsied.                           - A suture was found in the stomach.                           - Gastritis. Biopsied.                           -  Duodenitis. No specimens collected.                           - Normal first portion of the duodenum and second                            portion of the duodenum. No specimens collected. Recommendation:           - Return patient to hospital ward for ongoing care.                           - Resume previous diet indefinitely.                           - Await pathology results.                           - Use Prilosec (omeprazole) 20 mg PO BID for 8                            weeks.                            - Perform ambulatory esophageal manometry at                            appointment to be scheduled.  Antimicrobials: Rocephin and Zithromax from 07/05/2022 onwards.  Flagyl from 07/07/2022 onwards   Subjective: Patient seen and examined at bedside.  Denies worsening shortness of breath, fever or vomiting.  Feels slightly better today.    Vitals:   07/08/22 2200 07/09/22 0430 07/09/22 0700 07/09/22 0753  BP: 138/69 125/73  139/77  Pulse:      Resp: 20 (!) 21 18 18   Temp:  98.1 F (36.7 C)  98.1 F (36.7 C)  TempSrc:  Oral  Oral  SpO2: 98% 95%    Weight:      Height:        Intake/Output Summary (Last 24 hours) at 07/09/2022 0837 Last data filed at 07/08/2022 2357 Gross per 24 hour  Intake 1079.84 ml  Output 600 ml  Net 479.84 ml    Filed Weights   07/06/22 1405 07/07/22 0500 07/08/22 0500  Weight: 83.5 kg 83 kg 83.1 kg    Examination:  General: On 0.5 L oxygen via nasal cannula.  No distress.  respiratory: Bilateral decreased breath sounds at bases with scattered crackles and intermittent tachypnea.  Left-sided chest tube present. CVS: Currently rate controlled; S1-S2 heard  abdominal: Soft, nontender, slightly distended, no organomegaly; normal bowel sounds are heard  extremities: Trace lower extremity edema; no clubbing.       Data Reviewed: I have personally reviewed following labs and imaging studies  CBC: Recent Labs  Lab 07/05/22 1808 07/06/22 0157 07/07/22 0318 07/09/22 0337  WBC 23.7* 20.8* 17.3* 18.0*  NEUTROABS 21.8*  --  15.2* 15.8*  HGB 12.5* 10.5* 10.9* 10.5*  HCT 39.8 33.4* 33.3* 34.1*  MCV 89.2 90.5 87.6 90.9  PLT 679* 545* 483* 464*    Basic Metabolic Panel: Recent Labs  Lab 07/05/22 1808 07/06/22 0157 07/07/22 0318 07/09/22 0337  NA 132* 134* 133* 136  K 4.8 5.2*  4.2 4.6  CL 98 102 105 106  CO2 23 21* 22 24  GLUCOSE 243* 223* 233* 160*  BUN 37* 34* 33* 19  CREATININE 2.05* 1.93* 1.85* 1.65*  CALCIUM 9.4 8.5* 8.3* 8.4*   MG  --   --  1.9 1.9    GFR: Estimated Creatinine Clearance: 50.4 mL/min (A) (by C-G formula based on SCr of 1.65 mg/dL (H)). Liver Function Tests: Recent Labs  Lab 07/05/22 1808  AST 28  ALT 23  ALKPHOS 101  BILITOT 0.5  PROT 7.6  ALBUMIN 3.3*    No results for input(s): "LIPASE", "AMYLASE" in the last 168 hours. No results for input(s): "AMMONIA" in the last 168 hours. Coagulation Profile: Recent Labs  Lab 07/05/22 1808  INR 1.4*    Cardiac Enzymes: No results for input(s): "CKTOTAL", "CKMB", "CKMBINDEX", "TROPONINI" in the last 168 hours. BNP (last 3 results) No results for input(s): "PROBNP" in the last 8760 hours. HbA1C: No results for input(s): "HGBA1C" in the last 72 hours.  CBG: Recent Labs  Lab 07/07/22 1940 07/08/22 0825 07/08/22 1222 07/08/22 1717 07/08/22 1925  GLUCAP 260* 126* 232* 171* 158*    Lipid Profile: No results for input(s): "CHOL", "HDL", "LDLCALC", "TRIG", "CHOLHDL", "LDLDIRECT" in the last 72 hours. Thyroid Function Tests: No results for input(s): "TSH", "T4TOTAL", "FREET4", "T3FREE", "THYROIDAB" in the last 72 hours. Anemia Panel: No results for input(s): "VITAMINB12", "FOLATE", "FERRITIN", "TIBC", "IRON", "RETICCTPCT" in the last 72 hours. Sepsis Labs: Recent Labs  Lab 07/05/22 1808 07/05/22 1945  LATICACIDVEN 1.1 0.6     Recent Results (from the past 240 hour(s))  Blood Culture (routine x 2)     Status: None (Preliminary result)   Collection Time: 07/05/22  6:08 PM   Specimen: BLOOD  Result Value Ref Range Status   Specimen Description   Final    BLOOD LEFT ANTECUBITAL Performed at [facility], [address], [place], [place] [address]    Special Requests   Final    Blood Culture adequate volume BOTTLES DRAWN AEROBIC AND ANAEROBIC Performed at [facility], [address], [place], [place] [address]    Culture   Final    NO GROWTH 4 DAYS Performed at [place], [place] [address], [place], [place] [address]    Report Status PENDING  Incomplete  Blood Culture (routine x 2)     Status: None (Preliminary result)   Collection Time: 07/05/22  6:08 PM   Specimen: BLOOD  Result Value Ref Range Status   Specimen Description   Final    BLOOD BLOOD RIGHT FOREARM Performed at [facility], [address] [place], [place] [address]    Special Requests   Final    Blood Culture adequate volume BOTTLES DRAWN AEROBIC AND ANAEROBIC Performed at [facility], [address], [place], [place] [address]    Culture   Final    NO GROWTH 4 DAYS Performed at [facility], [place] [address], [place],  [address]    Report Status PENDING  Incomplete  Body fluid culture w Gram Stain     Status: None (Preliminary result)   Collection Time: 07/06/22  4:02 PM   Specimen: Pleura; Body Fluid  Result Value Ref Range Status   Specimen Description PLEURAL  Final   Special Requests LEFT  Final   Gram Stain NO WBC SEEN NO ORGANISMS SEEN   Final   Culture   Final    NO GROWTH <  24 HOURS Performed at [facility], [place] [address] [place], [place] [address]    Report Status PENDING  Incomplete         Radiology Studies: DG CHEST PORT 1 VIEW  Result Date: 07/08/2022 CLINICAL DATA:  Pleural effusion. EXAM: PORTABLE CHEST 1 VIEW COMPARISON:  07/06/2022 chest radiograph and prior studies FINDINGS: Increasing opacification of the LEFT hemithorax is noted. LEFT thoracostomy tube is again identified. High density material within the MEDIAL RIGHT lung base again noted. The remainder of the visualized RIGHT lung is clear. There is no evidence of pneumothorax. No acute bony abnormalities are noted. IMPRESSION: Increasing opacification of the LEFT hemithorax which may represent increasing pleural effusion, airspace disease/consolidation and/or atelectasis. Correlate with patency of thoracostomy tube. Electronically Signed   By:  Margarette Canada M.D.   On: 07/08/2022 16:13        Scheduled Meds:  alteplase (CATHFLO ACTIVASE) 10 mg in sodium chloride (PF) 0.9 % 30 mL  10 mg Intrapleural Once   And   dornase alfa (PULMOZYME) 5 mg in sterile water (preservative free) 30 mL  5 mg Intrapleural Once   alteplase (CATHFLO ACTIVASE) 10 mg in sodium chloride (PF) 0.9 % 30 mL  10 mg Intrapleural Once   And   dornase alfa (PULMOZYME) 5 mg in sterile water (preservative free) 30 mL  5 mg Intrapleural Once   enoxaparin (LOVENOX) injection  1 mg/kg Subcutaneous Q12H   insulin aspart  0-15 Units Subcutaneous TID WC   insulin aspart  0-5 Units Subcutaneous QHS   insulin aspart  6 Units Subcutaneous TID WC   insulin detemir  18 Units Subcutaneous QHS   levothyroxine  75 mcg Oral q morning   mouth rinse  15 mL Mouth Rinse 4 times per day   pantoprazole  40 mg Oral BID   sodium chloride flush  10 mL Intrapleural Q8H   sodium chloride flush  10 mL Intrapleural Q8H   sodium chloride flush  10 mL Intrapleural Q8H   Continuous Infusions:  azithromycin Stopped (07/08/22 1258)   cefTRIAXone (ROCEPHIN)  IV Stopped (07/08/22 1044)   metronidazole 500 mg (07/09/22 0600)          Aline August, MD [facility] 07/09/2022, 8:37 AM

## 2022-07-09 NOTE — Progress Notes (Signed)
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  Renal cell carcinoma with right nephrectomy 2021 per Dr. Alinda Money.  Ongoing evaluation for dysphagia with barium swallow indicative of chronic aspiration and narrowing of of the esophagus.  Suspect there is a component of aspiration.  Pulmonary critical care asked to evaluate for left effusion and suspected left empyema.  Agree with interim empirical antimicrobial therapy.  Pertinent  Medical History   Past Medical History:  Diagnosis Date   Alopecia    s/p chemotherapy   Arthritis    Bursitis    pt. denies   Cancer ([place]) 10/2007    tongue/squamous cell,stg IV,HPV positive   Diabetes mellitus without complication ([place])    takes Amaryl,Januvia,and Metformin,daily   DVT, lower extremity ([place]) 2009   side effect from chemo and has blood clot left leg-wears compression stockings;was on Coumadin for 75months and has been off 5 yrs   History of chemotherapy    Cisplatin/Taxotere,5-FU   History of radiation therapy 01/28/08-03/11/08   left base tongue   History of shingles    Hyperlipidemia    takes Vytorin daily   Hypothyroidism    takes Synthroid daily   Joint pain    Joint swelling    Neuropathy    left foot   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 denies   Right kidney mass    Seborrheic keratosis    multiple on back     Significant Hospital Events: Including procedures, antibiotic start and stop dates in addition to other pertinent events   07/06/2022 left  chest tube 07/07/2022 EGD  Interim History / Subjective:  1L or so out after lytics with improved aeration, also coughed up a large wad of mucus  Objective   Blood pressure 139/77, pulse (!) 115, temperature 98.1 F (36.7 C), temperature source Oral, resp. rate 18, height 6\' 3"  (1.905 m), weight 83.1 kg, SpO2 95 %.        Intake/Output Summary (Last 24 hours) at 07/09/2022 0916 Last data filed at 07/08/2022 2357 Gross per 24 hour  Intake 839.84 ml  Output 600 ml  Net 239.84 ml   Filed Weights   07/06/22 1405 07/07/22 0500 07/08/22 0500  Weight: 83.5 kg 83 kg 83.1 kg    Examination: General appearance: 66 y.o., male, NAD, conversant  Eyes: PERRL, tracking appropriately HENT: NCAT; MMM Lungs: more breath sounds on left but still quite rhonchorous, with normal respiratory effort CV: tachy RR, no murmur  Abdomen: Soft, non-tender; non-distended, BS present  Extremities: No peripheral edema, warm Neuro: grossly nonfocal  CXR with improved aeration on left  WBC 18   Resolved Hospital Problem list     Assessment & Plan:   Suspected left complicated parapneumonic effusion s/p pigtail placement 11/2 - keep to suction -20 - flush bid with saline - lytics today day 2 - cxr ordered for tomorrow  All other issues per primary  Best Practice (right click and "Reselect all SmartList Selections" daily)  Per primary  Critical care time: na     Walker Shadow  [place]  Please consult Amion 07/09/2022, 9:16 AM

## 2022-07-09 NOTE — Procedures (Signed)
Pleural Fibrinolytic Administration Procedure Note  RUFFUS KAMAKA  [id number]  11/03/1955  Date:07/09/22  Time:9:18 AM   Provider Performing:Alison Breeding M Verlee Monte   Procedure: Pleural Fibrinolysis Subsequent day [phone])  Indication(s) Fibrinolysis of complicated pleural effusion  Consent Risks of the procedure as well as the alternatives and risks of each were explained to the patient and/or caregiver.  Consent for the procedure was obtained.   Anesthesia None   Time Out Verified patient identification, verified procedure, site/side was marked, verified correct patient position, special equipment/implants available, medications/allergies/relevant history reviewed, required imaging and test results available.   Sterile Technique Hand hygiene, gloves   Procedure Description Existing pleural catheter was cleaned and accessed in sterile manner.  10mg  of tPA in 30cc of saline and 5mg  of dornase in 30cc of sterile water were injected into pleural space using existing pleural catheter.  Catheter will be clamped for 1 hour and then placed back to suction.   Complications/Tolerance None; patient tolerated the procedure well.  EBL None   Specimen(s) None

## 2022-07-10 ENCOUNTER — Inpatient Hospital Stay (HOSPITAL_COMMUNITY): Payer: 59

## 2022-07-10 DIAGNOSIS — J159 Unspecified bacterial pneumonia: Secondary | ICD-10-CM

## 2022-07-10 DIAGNOSIS — J90 Pleural effusion, not elsewhere classified: Secondary | ICD-10-CM | POA: Diagnosis not present

## 2022-07-10 LAB — GLUCOSE, CAPILLARY
Glucose-Capillary: 151 mg/dL — ABNORMAL HIGH (ref 70–99)
Glucose-Capillary: 125 mg/dL — ABNORMAL HIGH (ref 70–99)
Glucose-Capillary: 187 mg/dL — ABNORMAL HIGH (ref 70–99)
Glucose-Capillary: 150 mg/dL — ABNORMAL HIGH (ref 70–99)

## 2022-07-10 LAB — CULTURE, BLOOD (ROUTINE X 2)
Culture: NO GROWTH
Culture: NO GROWTH
Special Requests: ADEQUATE
Special Requests: ADEQUATE

## 2022-07-10 LAB — CBC WITH DIFFERENTIAL/PLATELET
Abs Immature Granulocytes: 0.11 10*3/uL — ABNORMAL HIGH (ref 0.00–0.07)
Basophils Absolute: 0 10*3/uL (ref 0.0–0.1)
Basophils Relative: 0 %
Eosinophils Absolute: 0 10*3/uL (ref 0.0–0.5)
Eosinophils Relative: 0 %
HCT: 34.7 % — ABNORMAL LOW (ref 39.0–52.0)
Hemoglobin: 10.8 g/dL — ABNORMAL LOW (ref 13.0–17.0)
Immature Granulocytes: 1 %
Lymphocytes Relative: 5 %
Lymphs Abs: 0.7 10*3/uL (ref 0.7–4.0)
MCH: 27.8 pg (ref 26.0–34.0)
MCHC: 31.1 g/dL (ref 30.0–36.0)
MCV: 89.4 fL (ref 80.0–100.0)
Monocytes Absolute: 1.2 10*3/uL — ABNORMAL HIGH (ref 0.1–1.0)
Monocytes Relative: 8 %
Neutro Abs: 13.5 10*3/uL — ABNORMAL HIGH (ref 1.7–7.7)
Neutrophils Relative %: 86 %
Platelets: 495 10*3/uL — ABNORMAL HIGH (ref 150–400)
RBC: 3.88 MIL/uL — ABNORMAL LOW (ref 4.22–5.81)
RDW: 13.3 % (ref 11.5–15.5)
WBC: 15.7 10*3/uL — ABNORMAL HIGH (ref 4.0–10.5)
nRBC: 0 % (ref 0.0–0.2)

## 2022-07-10 LAB — BASIC METABOLIC PANEL
Anion gap: 12 (ref 5–15)
BUN: 22 mg/dL (ref 8–23)
CO2: 22 mmol/L (ref 22–32)
Calcium: 8.4 mg/dL — ABNORMAL LOW (ref 8.9–10.3)
Chloride: 103 mmol/L (ref 98–111)
Creatinine, Ser: 1.87 mg/dL — ABNORMAL HIGH (ref 0.61–1.24)
GFR, Estimated: 39 mL/min — ABNORMAL LOW (ref >60)
Glucose, Bld: 144 mg/dL — ABNORMAL HIGH (ref 70–99)
Potassium: 4.5 mmol/L (ref 3.5–5.1)
Sodium: 137 mmol/L (ref 135–145)

## 2022-07-10 LAB — CYTOLOGY - NON PAP

## 2022-07-10 LAB — SURGICAL PATHOLOGY

## 2022-07-10 LAB — MAGNESIUM: Magnesium: 1.8 mg/dL (ref 1.7–2.4)

## 2022-07-10 MED ORDER — INSULIN DETEMIR 100 UNIT/ML ~~LOC~~ SOLN
18.0000 [IU] | Freq: Two times a day (BID) | SUBCUTANEOUS | Status: DC
  Administered 2022-07-10 (×2): 18 [IU] via SUBCUTANEOUS
  Filled 2022-07-10 (×4): qty 0.18

## 2022-07-10 MED ORDER — SODIUM CHLORIDE 0.9% FLUSH
10.0000 mL | Freq: Three times a day (TID) | INTRAVENOUS | Status: DC
  Administered 2022-07-10 – 2022-07-14 (×13): 10 mL via INTRAPLEURAL

## 2022-07-10 MED ORDER — ENOXAPARIN SODIUM 80 MG/0.8ML IJ SOSY
80.0000 mg | PREFILLED_SYRINGE | Freq: Two times a day (BID) | INTRAMUSCULAR | Status: DC
  Administered 2022-07-10 – 2022-07-14 (×8): 80 mg via SUBCUTANEOUS
  Filled 2022-07-10 (×11): qty 0.8

## 2022-07-10 MED ORDER — SODIUM CHLORIDE (PF) 0.9 % IJ SOLN
10.0000 mg | Freq: Once | INTRAMUSCULAR | Status: AC
  Administered 2022-07-10: 10 mg via INTRAPLEURAL
  Filled 2022-07-10: qty 10

## 2022-07-10 MED ORDER — SODIUM CHLORIDE 0.9 % IV BOLUS
250.0000 mL | Freq: Once | INTRAVENOUS | Status: AC
  Administered 2022-07-10: 250 mL via INTRAVENOUS

## 2022-07-10 MED ORDER — STERILE WATER FOR INJECTION IJ SOLN
5.0000 mg | Freq: Once | RESPIRATORY_TRACT | Status: AC
  Administered 2022-07-10: 5 mg via INTRAPLEURAL
  Filled 2022-07-10: qty 5

## 2022-07-10 MED ORDER — SODIUM CHLORIDE 0.9 % IV SOLN
2.0000 g | INTRAVENOUS | Status: DC
  Administered 2022-07-11 – 2022-07-13 (×3): 2 g via INTRAVENOUS
  Filled 2022-07-10 (×3): qty 20

## 2022-07-10 NOTE — Progress Notes (Signed)
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  Renal cell carcinoma with right nephrectomy 2021 per Dr. Alinda Money.  Ongoing evaluation for dysphagia with barium swallow indicative of chronic aspiration and narrowing of of the esophagus.  Suspect there is a component of aspiration.  Pulmonary critical care asked to evaluate for left effusion and suspected left empyema.  Agree with interim empirical antimicrobial therapy.  Pertinent  Medical History   Past Medical History:  Diagnosis Date   Alopecia    s/p chemotherapy   Arthritis    Bursitis    pt. denies   Cancer ([place]) 10/2007    tongue/squamous cell,stg IV,HPV positive   Diabetes mellitus without complication ([place])    takes Amaryl,Januvia,and Metformin,daily   DVT, lower extremity ([place]) 2009   side effect from chemo and has blood clot left leg-wears compression stockings;was on Coumadin for 73months and has been off 5 yrs   History of chemotherapy    Cisplatin/Taxotere,5-FU   History of radiation therapy 01/28/08-03/11/08   left base tongue   History of shingles    Hyperlipidemia    takes Vytorin daily   Hypothyroidism    takes Synthroid daily   Joint pain    Joint swelling    Neuropathy    left foot   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 denies   Right kidney mass    Seborrheic keratosis    multiple on back     Significant Hospital Events: Including procedures, antibiotic start and stop dates in addition to other pertinent events   07/06/2022 left  chest tube 07/07/2022 EGD  Interim History / Subjective:  640 cc's out per CT overnight  Coughing up secretions  CXR pending  Objective   Blood pressure 114/65, pulse 100, temperature 99.2 F (37.3 C), resp. rate 18, height 6\' 3"  (1.905 m), weight 83.1 kg, SpO2 92 %.        Intake/Output Summary (Last 24 hours) at 07/10/2022 1129 Last data filed at 07/10/2022 0700 Gross per 24 hour  Intake 500 ml  Output 640 ml  Net -140 ml   Filed Weights   07/06/22 1405 07/07/22 0500 07/08/22 0500  Weight: 83.5 kg 83 kg 83.1 kg    Examination: General appearance: 66 y.o., male, NAD, conversant  Eyes: PERRL, tracking appropriately HENT: NCAT; MMM, No LAD or JVD Lungs: Bilateral chest excursion, breath sounds diminished on left about 3/4 of the way up, improved since 11/5, L chest tube without leak, serosanguinous drainage, to 20 cm suction CV: tachy RR, no murmur  Abdomen: Soft, non-tender; non-distended, BS + Extremities: No peripheral edema, warm Neuro: grossly nonfocal  CXR shows improved aeration of the left lung.   WBC 15.7   Resolved Hospital Problem list     Assessment & Plan:   Suspected left complicated parapneumonic effusion s/p pigtail placement 11/2 - keep to suction -20 - flush bid with saline - Lytic again today ( #3) - CXR in am  - Consider follow up CT Chest    All other issues per primary  Best Practice (  right click and "Reselect all SmartList Selections" daily)  Per primary  Critical care time: na     Magdalen Spatz, MSN, AGACNP-BC [place] for personal pager PCCM on call pager [phone]    07/10/2022, 11:29 AM

## 2022-07-10 NOTE — Evaluation (Signed)
Physical Therapy Evaluation Patient Details Name: [name] MRN: [id number] DOB: [birth year] Today's Date: 07/10/2022  History of Present Illness  66 y.o. 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 Chest x-ray showed left-sided pneumonia with large effusion with possibility of empyema.  Underwent pigtail catheter placement on 07/06/2022.  Underwent EGD on 07/07/2022.  Now undergoing Pleural Fibrinolysis.  Clinical Impression  Patient presents with decreased mobility due to deficits listed in PT problem list.  Currently needing minguard A for hallway ambulation, previously independent and at one point walking 3 miles a day per pt.  Wife able to assist at d/c.  Feel he will benefit from skilled PT in the acute setting and from follow up [place] at d/c.  He has flight of steps to bedrooms so will need to progress activity tolerance.  Mobility referral and RN support for mobility will be needed as well.        Recommendations for follow up therapy are one component of a multi-disciplinary discharge planning process, led by the attending physician.  Recommendations may be updated based on patient status, additional functional criteria and insurance authorization.  Follow Up Recommendations Home health PT      Assistance Recommended at Discharge Intermittent Supervision/Assistance  Patient can return home with the following  A little help with walking and/or transfers;Assistance with cooking/housework;Assist for transportation;Help with stairs or ramp for entrance;A little help with bathing/dressing/bathroom    Equipment Recommendations Rolling  walker (2 wheels)  Recommendations for Other Services       Functional Status Assessment Patient has had a recent decline in their functional status and demonstrates the ability to make significant improvements in function in a reasonable and predictable amount of time.     Precautions / Restrictions Precautions Precautions: Fall Precaution Comments: watch O2, chest tube      Mobility  Bed Mobility Overal bed mobility: Needs Assistance Bed Mobility: Supine to Sit, Sit to Supine     Supine to sit: Supervision Sit to supine: Supervision   General bed mobility comments: assist for lines, increased time    Transfers Overall transfer level: Needs assistance Equipment used: Rolling walker (2 wheels) Transfers: Sit to/from Stand Sit to Stand: Min guard           General transfer comment: up from normal height bed despite long legs with minguard for safety and pt pushing up from bed    Ambulation/Gait Ambulation/Gait assistance: Min guard, Supervision Gait Distance (Feet): 90 Feet Assistive device: Rolling walker (2 wheels) Gait Pattern/deviations: Step-through pattern, Decreased stride length, Trunk flexed       General Gait Details: cues for turns as pt has not used RW in the past  Financial trader Rankin (Stroke Patients Only)       Balance Overall balance assessment: Needs assistance   Sitting balance-Leahy Scale: Good     Standing balance support: No upper extremity supported Standing balance-Leahy Scale: Fair Standing balance comment: can stand without UE support, RW for ambulation today  Pertinent Vitals/Pain Pain Assessment Pain Assessment: No/denies pain    Home Living Family/patient expects to be discharged to:: Private residence Living Arrangements: Spouse/significant other Available Help at Discharge: Family;Available 24 hours/day (wife works from home) Type of  Home: House Home Access: Stairs to enter Entrance Stairs-Rails: None Technical brewer of Steps: 3 Alternate Level Stairs-Number of Steps: 14 Home Layout: Two level Home Equipment: Marine scientist - single point      Prior Function Prior Level of Function : Independent/Modified Independent             Mobility Comments: retired       Journalist, newspaper        Extremity/Trunk Assessment   Upper Extremity Assessment Upper Extremity Assessment: LUE deficits/detail LUE Deficits / Details: Limited AROM post RCR in the past, denies pain; shoulder flexion to about 80    Lower Extremity Assessment Lower Extremity Assessment: Overall WFL for tasks assessed       Communication   Communication: No difficulties  Cognition Arousal/Alertness: Awake/alert Behavior During Therapy: WFL for tasks assessed/performed Overall Cognitive Status: Within Functional Limits for tasks assessed                                          General Comments General comments (skin integrity, edema, etc.): on RA for ambulation HR up to 160 x 1 mainly in 130's; SpO2 not reading well, placed on 2L in the room as reading low, but inaccurate; 93% once registering in the room    Exercises     Assessment/Plan    PT Assessment Patient needs continued PT services  PT Problem List Decreased strength;Decreased balance;Decreased activity tolerance;Cardiopulmonary status limiting activity;Decreased knowledge of use of DME       PT Treatment Interventions DME instruction;Functional mobility training;Balance training;Patient/family education;Therapeutic activities;Gait training;Stair training;Therapeutic exercise    PT Goals (Current goals can be found in the Care Plan section)  Acute Rehab PT Goals Patient Stated Goal: to return to independent PT Goal Formulation: With patient/family Time For Goal Achievement: 07/24/22 Potential to Achieve Goals: Good    Frequency Min 3X/week      Co-evaluation               AM-PAC PT "6 Clicks" Mobility  Outcome Measure Help needed turning from your back to your side while in a flat bed without using bedrails?: A Little Help needed moving from lying on your back to sitting on the side of a flat bed without using bedrails?: A Little Help needed moving to and from a bed to a chair (including a wheelchair)?: A Little Help needed standing up from a chair using your arms (e.g., wheelchair or bedside chair)?: A Little Help needed to walk in hospital room?: A Little Help needed climbing 3-5 steps with a railing? : Total 6 Click Score: 16    End of Session Equipment Utilized During Treatment: Oxygen Activity Tolerance: Patient limited by fatigue Patient left: in bed Nurse Communication: Mobility status PT Visit Diagnosis: Muscle weakness (generalized) (M62.81);Other abnormalities of gait and mobility (R26.89)    Time: 1450-1515 PT Time Calculation (min) (ACUTE ONLY): 25 min   Charges:   PT Evaluation $PT Eval Moderate Complexity: 1 Mod PT Treatments $Gait Training: 8-22 mins        Magda Kiel, PT Acute Rehabilitation Services Office:[phone] 07/10/2022   Reginia Naas 07/10/2022, 3:25 PM

## 2022-07-10 NOTE — Procedures (Addendum)
Pleural Fibrinolytic Administration Procedure Note  BRAILON DON  [id number]  [birth year]  Date:07/10/22  Time:1:47 PM   Provider Performing:Myracle Febres Harle Battiest NP  Procedure: Pleural Fibrinolysis Subsequent day [phone]) Day #3  Indication(s) Fibrinolysis of complicated pleural effusion  Consent Risks of the procedure as well as the alternatives and risks of each were explained to the patient and/or caregiver.  Consent for the procedure was obtained.   Anesthesia None   Time Out Verified patient identification, verified procedure, site/side was marked, verified correct patient position, special equipment/implants available, medications/allergies/relevant history reviewed, required imaging and test results available.   Sterile Technique Hand hygiene, gloves   Procedure Description Existing pleural catheter was cleaned and accessed in sterile manner.  10mg  of tPA in 30cc of saline and 5mg  of dornase in 30cc of sterile water were injected into pleural space using existing pleural catheter.  Catheter will be clamped for 1 hour and then placed back to suction.   Complications/Tolerance None; patient tolerated the procedure well.  EBL None   Specimen(s) None   Communicated with Nate RN that chest tube needs to remain clamped until 14:45, and then unclamped to -20 suction as [place] is currently set.

## 2022-07-10 NOTE — Progress Notes (Signed)
PROGRESS NOTE    [name]  VHQ:[id number] DOB: [birth year] DOA: 07/05/2022 PCP: [name], MD   Brief Narrative:  66 y.o. 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  On presentation, he was hypoxic with oxygen saturation in the 80s on room air along with tachycardia with creatinine of 2.05, WBC of 23.7.  Chest x-ray showed left-sided pneumonia with large effusion with possibility of empyema.  CTA of the chest showed no pulm embolism but showed possible right lower lobe and right middle lobe atypical infection along with moderate left hydropneumothorax.  Pulmonary was consulted and patient underwent pigtail catheter placement on 07/06/2022.  GI consulted for dysphagia: Underwent EGD on 07/07/2022.  Assessment & Plan:   Acute respiratory failure with hypoxia Bilateral community-acquired pneumonia with possibility of aspiration pneumonia as well along with concerns for left-sided empyema Possible left-sided empyema versus hydropneumothorax -Imaging as above.  Currently on Rocephin, Zithromax and Flagyl. -Pulmonary following: Status post pigtail pleural catheter placement by pulmonary on 07/06/2022.  Followed further recommendations.  Underwent pleural fibrinolysis on 07/08/2022 and 07/09/2022 by pulmonary. -Currently  on 0.5 L oxygen via nasal cannula. -Cultures have remained negative so far.  Leukocytosis -Improving.  Ongoing dysphagia History of squamous cell carcinoma of tongue status post chemotherapy and radiation -Patient has had ongoing work-up for dysphagia with outpatient barium swallow indicating chronic aspiration and  narrowing of esophagus. -SLP following.  ENT evaluation appreciated. -Status post EGD on 07/07/2022: Showed gastritis/duodenitis: Biopsied.  GI recommends PPI twice daily for 8 weeks and cleared the patient to resume anticoagulation.  Soft diet has been ordered.  Patient will need outpatient manometry testing if symptoms of dysphagia persist.  GI signed off on 07/08/2022.  Chronic kidney disease stage IIIb -Creatinine currently stable.  Monitor.  Anemia of chronic disease -From kidney disease.  Hemoglobin stable.  Monitor intermittently.  Hyponatremia -Resolved  Hypokalemia -Resolved  Thrombocytosis -Possibly reactive.  Monitor  Diabetes mellitus type 2 with hyperglycemia -A1c 9.7.  Continue CBGs with SSI.  Continue long-acting insulin  Hypothyroidism -Continue levothyroxine   DVT prophylaxis: SCDs Code Status: Full Family Communication: Wife at bedside Disposition Plan: Status is: Inpatient Remains inpatient appropriate because: Of severity of  illness   Consultants: Pulmonary/ENT/GI  Procedures: Left-sided chest tube placement on 07/06/2022 EGD on 07/07/2022 Impression:               - Z-line regular, 47 cm from the incisors. No                            specimens collected.                           - Salmon-colored mucosa. Biopsied.                           - A suture was found in the stomach.                           - Gastritis. Biopsied.                           -  Duodenitis. No specimens collected.                           - Normal first portion of the duodenum and second                            portion of the duodenum. No specimens collected. Recommendation:           - Return patient to hospital ward for ongoing care.                           - Resume previous diet indefinitely.                           - Await pathology results.                           - Use Prilosec (omeprazole) 20 mg PO BID for 8                            weeks.                            - Perform ambulatory esophageal manometry at                            appointment to be scheduled.  Antimicrobials: Rocephin and Zithromax from 07/05/2022 onwards.  Flagyl from 07/07/2022 onwards   Subjective: Patient seen and examined at bedside.  No fever, chest pain or worsening shortness of breath reported. Vitals:   07/09/22 1600 07/09/22 2100 07/09/22 2200 07/10/22 0431  BP: 133/75 113/70  111/71  Pulse: (!) 109 (!) 108 (!) 105 (!) 108  Resp: 19 20 19 20   Temp: 97.9 F (36.6 C)   98 F (36.7 C)  TempSrc: Oral   Oral  SpO2: 97% 95% 96% 95%  Weight:      Height:        Intake/Output Summary (Last 24 hours) at 07/10/2022 0807 Last data filed at 07/10/2022 0700 Gross per 24 hour  Intake 500 ml  Output 640 ml  Net -140 ml    Filed Weights   07/06/22 1405 07/07/22 0500 07/08/22 0500  Weight: 83.5 kg 83 kg 83.1 kg    Examination:  General: No acute distress currently.  Still on 0.5 L oxygen by nasal cannula. respiratory: Decreased breath sounds at bases bilaterally with some crackles.  Left-sided chest tube present. CVS: Mild intermittent tachycardia, present; S1 and S2 are heard  abdominal: Soft, nontender, distended mildly; no organomegaly; bowel sounds heard  extremities: No cyanosis; mild lower extremity edema present    Data Reviewed: I have personally reviewed following labs and imaging studies  CBC: Recent Labs  Lab 07/05/22 1808 07/06/22 0157 07/07/22 0318 07/09/22 0337 07/10/22 0428  WBC 23.7* 20.8* 17.3* 18.0* 15.7*  NEUTROABS 21.8*  --  15.2* 15.8* 13.5*  HGB 12.5* 10.5* 10.9* 10.5* 10.8*  HCT 39.8 33.4* 33.3* 34.1* 34.7*  MCV 89.2 90.5 87.6 90.9 89.4  PLT 679* 545* 483* 464* 495*    Basic Metabolic Panel: Recent Labs  Lab 07/05/22 1808 07/06/22 0157 07/07/22 0318 07/09/22 0337 07/10/22 0428  NA 132* 134*  133* 136 137  K 4.8 5.2* 4.2 4.6 4.5  CL 98 102 105 106 103  CO2 23 21* 22 24 22   GLUCOSE 243* 223* 233* 160* 144*  BUN  37* 34* 33* 19 22  CREATININE 2.05* 1.93* 1.85* 1.65* 1.87*  CALCIUM 9.4 8.5* 8.3* 8.4* 8.4*  MG  --   --  1.9 1.9 1.8    GFR: Estimated Creatinine Clearance: 44.4 mL/min (A) (by C-G formula based on SCr of 1.87 mg/dL (H)). Liver Function Tests: Recent Labs  Lab 07/05/22 1808  AST 28  ALT 23  ALKPHOS 101  BILITOT 0.5  PROT 7.6  ALBUMIN 3.3*    No results for input(s): "LIPASE", "AMYLASE" in the last 168 hours. No results for input(s): "AMMONIA" in the last 168 hours. Coagulation Profile: Recent Labs  Lab 07/05/22 1808  INR 1.4*    Cardiac Enzymes: No results for input(s): "CKTOTAL", "CKMB", "CKMBINDEX", "TROPONINI" in the last 168 hours. BNP (last 3 results) No results for input(s): "PROBNP" in the last 8760 hours. HbA1C: No results for input(s): "HGBA1C" in the last 72 hours.  CBG: Recent Labs  Lab 07/08/22 1925 07/09/22 0856 07/09/22 1253 07/09/22 1742 07/09/22 2033  GLUCAP 158* 140* 176* 208* 248*    Lipid Profile: No results for input(s): "CHOL", "HDL", "LDLCALC", "TRIG", "CHOLHDL", "LDLDIRECT" in the last 72 hours. Thyroid Function Tests: No results for input(s): "TSH", "T4TOTAL", "FREET4", "T3FREE", "THYROIDAB" in the last 72 hours. Anemia Panel: No results for input(s): "VITAMINB12", "FOLATE", "FERRITIN", "TIBC", "IRON", "RETICCTPCT" in the last 72 hours. Sepsis Labs: Recent Labs  Lab 07/05/22 1808 07/05/22 1945  LATICACIDVEN 1.1 0.6     Recent Results (from the past 240 hour(s))  Blood Culture (routine x 2)     Status: None   Collection Time: 07/05/22  6:08 PM   Specimen: BLOOD  Result Value Ref Range Status   Specimen Description   Final    BLOOD LEFT ANTECUBITAL Performed at [facility], [address], [place], [place] [address]    Special Requests   Final    Blood Culture adequate volume BOTTLES DRAWN AEROBIC AND ANAEROBIC Performed at [facility], [address], [place], [place]  [address]    Culture   Final    NO GROWTH 5 DAYS Performed at [facility], [place] [address] [place], [place] [address]    Report Status 07/10/2022 FINAL  Final  Blood Culture (routine x 2)     Status: None   Collection Time: 07/05/22  6:08 PM   Specimen: BLOOD  Result Value Ref Range Status   Specimen Description   Final    BLOOD BLOOD RIGHT FOREARM Performed at [facility], [address], [place], [place] [address]    Special Requests   Final    Blood Culture adequate volume BOTTLES DRAWN AEROBIC AND ANAEROBIC Performed at [facility], [address], [place], [place] [address]    Culture   Final    NO GROWTH 5 DAYS Performed at [facility], [place] [address] [place], [place] [address]    Report Status 07/10/2022 FINAL  Final  Body fluid culture w Gram Stain     Status: None (Preliminary result)   Collection Time: 07/06/22  4:02 PM   Specimen: Pleura; Body Fluid  Result Value Ref Range Status   Specimen Description PLEURAL  Final   Special Requests LEFT  Final   Gram Stain NO WBC SEEN NO ORGANISMS SEEN  Final   Culture   Final    NO GROWTH 2 DAYS Performed at [facility], [place] [address], [place], [place] [address]    Report Status PENDING  Incomplete         Radiology Studies: DG CHEST PORT 1 VIEW  Result Date: 07/09/2022 CLINICAL DATA:  Follow-up pleural effusion EXAM: PORTABLE CHEST 1 VIEW COMPARISON:  07/08/2022 FINDINGS: Cardiac shadow is stable but somewhat obscured by the consolidation within the left lung. Minimal improved aeration in the left lung is noted. Pigtail catheter is again seen on the left. No pneumothorax is noted. Right lung is well aerated with basilar opacities consistent with previous aspiration. No acute bony abnormality is noted. IMPRESSION: Slight improved aeration in the left lung when compared with the previous day. Electronically Signed   By: Inez Catalina M.D.   On: 07/09/2022 09:29    DG CHEST PORT 1 VIEW  Result Date: 07/08/2022 CLINICAL DATA:  Pleural effusion. EXAM: PORTABLE CHEST 1 VIEW COMPARISON:  07/06/2022 chest radiograph and prior studies FINDINGS: Increasing opacification of the LEFT hemithorax is noted. LEFT thoracostomy tube is again identified. High density material within the MEDIAL RIGHT lung base again noted. The remainder of the visualized RIGHT lung is clear. There is no evidence of pneumothorax. No acute bony abnormalities are noted. IMPRESSION: Increasing opacification of the LEFT hemithorax which may represent increasing pleural effusion, airspace disease/consolidation and/or atelectasis. Correlate with patency of thoracostomy tube. Electronically Signed   By: Margarette Canada M.D.   On: 07/08/2022 16:13        Scheduled Meds:  alteplase (CATHFLO ACTIVASE) 10 mg in sodium chloride (PF) 0.9 % 30 mL  10 mg Intrapleural Once   And   dornase alfa (PULMOZYME) 5 mg in sterile water (preservative free) 30 mL  5 mg Intrapleural Once   alteplase (CATHFLO ACTIVASE) 10 mg in sodium chloride (PF) 0.9 % 30 mL  10 mg Intrapleural Once   And   dornase alfa (PULMOZYME) 5 mg in sterile water (preservative free) 30 mL  5 mg Intrapleural Once   enoxaparin (LOVENOX) injection  1 mg/kg Subcutaneous Q12H   insulin aspart  0-15 Units Subcutaneous TID WC   insulin aspart  0-5 Units Subcutaneous QHS   insulin aspart  6 Units Subcutaneous TID WC   insulin detemir  18 Units Subcutaneous QHS   levothyroxine  75 mcg Oral q morning   mouth rinse  15 mL Mouth Rinse 4 times per day   pantoprazole  40 mg Oral BID   sodium chloride flush  10 mL Intrapleural Q8H   sodium chloride flush  10 mL Intrapleural Q8H   sodium chloride flush  10 mL Intrapleural Q8H   Continuous Infusions:  azithromycin 500 mg (07/09/22 0950)   cefTRIAXone (ROCEPHIN)  IV 2 g (07/09/22 0900)   metronidazole 500 mg (07/10/22 7001)          Aline August, MD [facility] 07/10/2022, 8:07 AM

## 2022-07-11 ENCOUNTER — Inpatient Hospital Stay (HOSPITAL_COMMUNITY): Payer: 59

## 2022-07-11 DIAGNOSIS — J9601 Acute respiratory failure with hypoxia: Secondary | ICD-10-CM | POA: Insufficient documentation

## 2022-07-11 DIAGNOSIS — D72829 Elevated white blood cell count, unspecified: Secondary | ICD-10-CM | POA: Insufficient documentation

## 2022-07-11 DIAGNOSIS — D638 Anemia in other chronic diseases classified elsewhere: Secondary | ICD-10-CM | POA: Insufficient documentation

## 2022-07-11 DIAGNOSIS — R1310 Dysphagia, unspecified: Secondary | ICD-10-CM

## 2022-07-11 DIAGNOSIS — D75839 Thrombocytosis, unspecified: Secondary | ICD-10-CM | POA: Insufficient documentation

## 2022-07-11 DIAGNOSIS — N183 Chronic kidney disease, stage 3 unspecified: Secondary | ICD-10-CM | POA: Insufficient documentation

## 2022-07-11 DIAGNOSIS — J159 Unspecified bacterial pneumonia: Secondary | ICD-10-CM | POA: Diagnosis not present

## 2022-07-11 DIAGNOSIS — J90 Pleural effusion, not elsewhere classified: Secondary | ICD-10-CM | POA: Diagnosis not present

## 2022-07-11 LAB — CBC WITH DIFFERENTIAL/PLATELET
Abs Immature Granulocytes: 0.12 10*3/uL — ABNORMAL HIGH (ref 0.00–0.07)
Basophils Absolute: 0 10*3/uL (ref 0.0–0.1)
Basophils Relative: 0 %
Eosinophils Absolute: 0 10*3/uL (ref 0.0–0.5)
Eosinophils Relative: 0 %
HCT: 34.2 % — ABNORMAL LOW (ref 39.0–52.0)
Hemoglobin: 10.9 g/dL — ABNORMAL LOW (ref 13.0–17.0)
Immature Granulocytes: 1 %
Lymphocytes Relative: 6 %
Lymphs Abs: 0.9 10*3/uL (ref 0.7–4.0)
MCH: 28.4 pg (ref 26.0–34.0)
MCHC: 31.9 g/dL (ref 30.0–36.0)
MCV: 89.1 fL (ref 80.0–100.0)
Monocytes Absolute: 1.2 10*3/uL — ABNORMAL HIGH (ref 0.1–1.0)
Monocytes Relative: 7 %
Neutro Abs: 14.5 10*3/uL — ABNORMAL HIGH (ref 1.7–7.7)
Neutrophils Relative %: 86 %
Platelets: 451 10*3/uL — ABNORMAL HIGH (ref 150–400)
RBC: 3.84 MIL/uL — ABNORMAL LOW (ref 4.22–5.81)
RDW: 13.2 % (ref 11.5–15.5)
WBC: 16.9 10*3/uL — ABNORMAL HIGH (ref 4.0–10.5)
nRBC: 0 % (ref 0.0–0.2)

## 2022-07-11 LAB — GLUCOSE, CAPILLARY
Glucose-Capillary: 54 mg/dL — ABNORMAL LOW (ref 70–99)
Glucose-Capillary: 182 mg/dL — ABNORMAL HIGH (ref 70–99)
Glucose-Capillary: 294 mg/dL — ABNORMAL HIGH (ref 70–99)
Glucose-Capillary: 161 mg/dL — ABNORMAL HIGH (ref 70–99)
Glucose-Capillary: 174 mg/dL — ABNORMAL HIGH (ref 70–99)

## 2022-07-11 LAB — BODY FLUID CULTURE W GRAM STAIN
Culture: NO GROWTH
Gram Stain: NONE SEEN

## 2022-07-11 LAB — BASIC METABOLIC PANEL
Anion gap: 8 (ref 5–15)
BUN: 24 mg/dL — ABNORMAL HIGH (ref 8–23)
CO2: 25 mmol/L (ref 22–32)
Calcium: 8.4 mg/dL — ABNORMAL LOW (ref 8.9–10.3)
Chloride: 105 mmol/L (ref 98–111)
Creatinine, Ser: 2.05 mg/dL — ABNORMAL HIGH (ref 0.61–1.24)
GFR, Estimated: 35 mL/min — ABNORMAL LOW (ref >60)
Glucose, Bld: 74 mg/dL (ref 70–99)
Potassium: 4.7 mmol/L (ref 3.5–5.1)
Sodium: 138 mmol/L (ref 135–145)

## 2022-07-11 LAB — MAGNESIUM: Magnesium: 1.8 mg/dL (ref 1.7–2.4)

## 2022-07-11 MED ORDER — INSULIN DETEMIR 100 UNIT/ML ~~LOC~~ SOLN
15.0000 [IU] | Freq: Every day | SUBCUTANEOUS | Status: DC
  Administered 2022-07-11 – 2022-07-14 (×4): 15 [IU] via SUBCUTANEOUS
  Filled 2022-07-11 (×4): qty 0.15

## 2022-07-11 NOTE — Progress Notes (Signed)
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  Renal cell carcinoma with right nephrectomy 2021 per Dr. Alinda Money.  Ongoing evaluation for dysphagia with barium swallow indicative of chronic aspiration and narrowing of of the esophagus.  Suspect there is a component of aspiration.  Pulmonary critical care asked to evaluate for left effusion and suspected left empyema.  Agree with interim empirical antimicrobial therapy.  Pertinent  Medical History   Past Medical History:  Diagnosis Date   Alopecia    s/p chemotherapy   Arthritis    Bursitis    pt. denies   Cancer ([place]) 10/2007    tongue/squamous cell,stg IV,HPV positive   Diabetes mellitus without complication ([place])    takes Amaryl,Januvia,and Metformin,daily   DVT, lower extremity ([place]) 2009   side effect from chemo and has blood clot left leg-wears compression stockings;was on Coumadin for 35months and has been off 5 yrs   History of chemotherapy    Cisplatin/Taxotere,5-FU   History of radiation therapy 01/28/08-03/11/08   left base tongue   History of shingles    Hyperlipidemia    takes Vytorin daily   Hypothyroidism    takes Synthroid daily   Joint pain    Joint swelling    Neuropathy    left foot   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 denies   Right kidney mass    Seborrheic keratosis    multiple on back     Significant Hospital Events: Including procedures, antibiotic start and stop dates in addition to other pertinent events   07/06/2022 left  chest tube 07/07/2022 EGD negative findings 07/10/2022 completion of 3 thrombolytic injections via chest tube  Interim History / Subjective:  640 cc's out per CT overnight  Coughing up secretions  CXR pending  Objective   Blood pressure 98/71, pulse (!) 101, temperature 97.9 F (36.6 C), temperature source Oral, resp. rate 19, height 6\' 3"  (1.905 m), weight 88.9 kg, SpO2 99 %.        Intake/Output Summary (Last 24 hours) at 07/11/2022 0856 Last data filed at 07/11/2022 0730 Gross per 24 hour  Intake 10 ml  Output 1525 ml  Net -1515 ml   Filed Weights   07/07/22 0500 07/08/22 0500 07/11/22 0500  Weight: 83 kg 83.1 kg 88.9 kg    Examination: General: Thin male no acute distress HEENT: MM pink/moist no JVD is appreciated Neuro: Grossly intact without focal defect CV: Heart sounds are regular PULM: Diminished in the bases chest tube with 800 cc blood-tinged drainage  GI: soft, bsx4 active  GU: Voids Extremities: warm/dry, negative edema  Skin: no rashes or lesions  CXR shows  slight improved aeration of the left lung.  CT well postioned  Recent Labs  Lab 07/09/22 0337 07/10/22 0428 07/11/22 0417  NA 136 137 138  K 4.6 4.5 4.7  CL 106 103 105  CO2 24 22 25   BUN 19 22 24*  CREATININE 1.65* 1.87* 2.05*  GLUCOSE 160* 144* 74   Recent Labs  Lab 07/09/22 0337 07/10/22 0428 07/11/22 0417  HGB 10.5* 10.8* 10.9*  HCT 34.1* 34.7* 34.2*  WBC 18.0* 15.7* 16.9*  PLT 464* 495* 451*     Resolved Hospital Problem list     Assessment & Plan:   Suspected left complicated parapneumonic effusion s/p pigtail placement 11/2 Lytics completed 07/10/2022 03/10/2022 800 cc of chest tube drainage every 24 hours Continue suction at -20 No further lytics CT of the chest without contrast has been ordered       All other issues per primary  Best Practice (right click and "Reselect all SmartList Selections" daily)  Per primary  Critical care time: Ferol Luz Winthrop Shannahan  ACNP Acute Care Nurse Practitioner [place] Please consult Amion 07/11/2022, 8:56 AM    07/11/2022, 8:56 AM

## 2022-07-11 NOTE — Evaluation (Signed)
Occupational Therapy Evaluation Patient Details Name: [name] MRN: [id number] DOB: 14-Mar-1956 Today's Date: 07/11/2022   History of Present Illness 66 y.o. 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 Chest x-ray showed left-sided pneumonia with large effusion with possibility of empyema.  Underwent pigtail catheter placement on 07/06/2022.  Underwent EGD on 07/07/2022.  Now undergoing Pleural Fibrinolysis.   Clinical Impression   Pt's wife reported that they will be able to assist as needed as works from home. Pt at this time in session was very limited as was able to complete bed mobility with supervision with increase in time due to line management. Pt attempted to complete one sit to stand transfer but reported to feel dizzy and increase in SOB. Pt then went into sitting at EOB and reported decrease in dizziness but still feeling SOB. Pt noted to have difficulties bring o2 reading above 85-89% on RA and required 2L of o2 via [place]. Nursing was made aware. Pt was able to complete UE dressing with set up and min assist with LB ADLS. Pt currently with functional limitations due to the deficits listed below (see OT Problem List).  Pt will benefit from skilled OT to increase their safety and independence with ADL and functional mobility for ADL to facilitate discharge to venue listed below.        Recommendations for follow up therapy are one component of a multi-disciplinary discharge planning process, led by the attending physician.  Recommendations may be updated based on patient status, additional functional criteria and insurance  authorization.   Follow Up Recommendations  Home health OT    Assistance Recommended at Discharge Frequent or constant Supervision/Assistance  Patient can return home with the following A little help with walking and/or transfers;A little help with bathing/dressing/bathroom;Assistance with cooking/housework;Assist for transportation    Functional Status Assessment  Patient has had a recent decline in their functional status and demonstrates the ability to make significant improvements in function in a reasonable and predictable amount of time.  Equipment Recommendations  None recommended by OT    Recommendations for Other Services       Precautions / Restrictions Precautions Precautions: Fall Precaution Comments: watch O2, chest tube Restrictions Weight Bearing Restrictions: No      Mobility Bed Mobility Overal bed mobility: Needs Assistance Bed Mobility: Supine to Sit, Sit to Supine     Supine to sit: Supervision Sit to supine: Supervision        Transfers Overall transfer level: Needs assistance Equipment used: Rolling walker (2 wheels) Transfers: Sit to/from Stand Sit to Stand: Min guard                  Balance Overall balance assessment: Needs assistance Sitting-balance support: Feet supported Sitting balance-Leahy Scale: Good     Standing balance support: Bilateral upper extremity supported Standing balance-Leahy Scale: Fair Standing balance comment: Pt using at this time due to feeling SOB with any standing activities at this time                           ADL either performed or assessed with clinical judgement   ADL Overall ADL's : Needs assistance/impaired Eating/Feeding: Independent;Sitting  Grooming: Wash/dry hands;Wash/dry face;Set up;Sitting   Upper Body Bathing: Set up;Sitting   Lower Body Bathing: Minimal assistance;Sit to/from stand   Upper Body Dressing : Set up;Sitting   Lower Body Dressing: Minimal  assistance;Cueing for safety;Cueing for sequencing;With adaptive equipment;Sit to/from stand       Toileting- Water quality scientist and Hygiene: Minimal assistance;Cueing for safety;Cueing for sequencing;Sitting/lateral lean         General ADL Comments: Pt limited in session due to SOV even with placement of o2 at this time and requesting to lay back down     Vision         Perception     Praxis      Pertinent Vitals/Pain Pain Assessment Pain Assessment: No/denies pain Faces Pain Scale: No hurt     Hand Dominance     Extremity/Trunk Assessment Upper Extremity Assessment Upper Extremity Assessment: LUE deficits/detail LUE Deficits / Details: Limited AROM post RCR in the past, denies pain; shoulder flexion to about 80 LUE Sensation: WNL LUE Coordination: decreased gross motor   Lower Extremity Assessment Lower Extremity Assessment: Defer to PT evaluation   Cervical / Trunk Assessment Cervical / Trunk Assessment: Kyphotic   Communication Communication Communication: No difficulties   Cognition Arousal/Alertness: Awake/alert Behavior During Therapy: WFL for tasks assessed/performed Overall Cognitive Status: Within Functional Limits for tasks assessed                                       General Comments       Exercises     Shoulder Instructions      Home Living Family/patient expects to be discharged to:: Private residence Living Arrangements: Spouse/significant other Available Help at Discharge: Family;Available 24 hours/day Type of Home: House Home Access: Stairs to enter [employer] of Steps: 3 Entrance Stairs-Rails: None Home Layout: Two level Alternate Level Stairs-Number of Steps: 14 Alternate Level Stairs-Rails: Right Bathroom Shower/Tub: Occupational psychologist: Standard     Home Equipment: Marine scientist - single point          Prior Functioning/Environment Prior Level of Function :  Independent/Modified Independent             Mobility Comments: retired          Secretary/administrator Problem List: Decreased activity tolerance;Impaired balance (sitting and/or standing);Decreased knowledge of use of DME or AE;Cardiopulmonary status limiting activity      OT Treatment/Interventions: Self-care/ADL training;Therapeutic activities;Patient/family education;Balance training    OT Goals(Current goals can be found in the care plan section) Acute Rehab OT Goals Patient Stated Goal: to be able to move more without SOB OT Goal Formulation: With patient Time For Goal Achievement: 07/25/22 Potential to Achieve Goals: Good  OT Frequency: Min 2X/week    Co-evaluation              AM-PAC OT "6 Clicks" Daily Activity     Outcome Measure Help from another person eating meals?: None Help from another person taking care of personal grooming?: None Help from another person toileting, which includes using toliet, bedpan, or urinal?: A Little Help from another person bathing (including washing, rinsing, drying)?: A Little Help from another person to put on and taking off regular upper body clothing?: None Help from another person to put on and taking off regular lower body clothing?: A Little 6 Click Score: 21   End of Session Equipment Utilized During Treatment: Gait belt;Rolling  walker (2 wheels) Nurse Communication: Mobility status;Other (comment) (o2)  Activity Tolerance: Patient limited by fatigue Patient left: in bed;with call bell/phone within reach;with family/visitor present  OT Visit Diagnosis: Unsteadiness on feet (R26.81);Other abnormalities of gait and mobility (R26.89);Muscle weakness (generalized) (M62.81)                Time: 9093-1121 OT Time Calculation (min): 33 min Charges:  OT General Charges $OT Visit: 1 Visit OT Evaluation $OT Eval Low Complexity: 1 Low OT Treatments $Self Care/Home Management : 8-22 mins  Joeseph Amor OTR/L  Acute Rehab Services   [phone] office number [phone] pager number   Joeseph Amor 07/11/2022, 11:00 AM

## 2022-07-11 NOTE — Progress Notes (Addendum)
PROGRESS NOTE    DEVYNN SCHEFF  EGB:[id number] DOB: 09/30/1955 DOA: 07/05/2022 PCP: [name], MD   Brief Narrative:  66 y.o. 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  On presentation, he was hypoxic with oxygen saturation in the 80s on room air along with tachycardia with creatinine of 2.05, WBC of 23.7.  Chest x-ray showed left-sided pneumonia with large effusion with possibility of empyema.  CTA of the chest showed no pulm embolism but showed possible right lower lobe and right middle lobe atypical infection along with moderate left hydropneumothorax.  Pulmonary was consulted and patient underwent pigtail catheter placement on 07/06/2022.  GI consulted for dysphagia: Underwent EGD on 07/07/2022.  Assessment & Plan:   Acute respiratory failure with hypoxia Bilateral community-acquired pneumonia with possibility of aspiration pneumonia as well along with concerns for left-sided empyema Sepsis: present on admission; from above Possible left-sided empyema versus hydropneumothorax -Imaging as above.  Currently on Rocephin and Flagyl.  Has completed 5-day course of Zithromax. -Pulmonary following: Status post pigtail pleural catheter placement by pulmonary on 07/06/2022.  Followed further recommendations.  Underwent pleural fibrinolysis on 07/08/2022, 07/09/2022 and 07/10/2022 by pulmonary. -Currently still on 0.5 L oxygen via nasal cannula. -Cultures have remained negative so far. -sepsis has resolved  Leukocytosis -WBC 16.9 today, slightly worsened since yesterday.  Repeat a.m. labs.  Ongoing dysphagia History of squamous cell  carcinoma of tongue status post chemotherapy and radiation -Patient has had ongoing work-up for dysphagia with outpatient barium swallow indicating chronic aspiration and narrowing of esophagus. -SLP following.  ENT evaluation appreciated. -Status post EGD on 07/07/2022: Showed gastritis/duodenitis: Biopsied.  GI recommends PPI twice daily for 8 weeks and cleared the patient to resume anticoagulation.  Soft diet has been ordered.  Patient will need outpatient manometry testing if symptoms of dysphagia persist.  GI signed off on 07/08/2022.  Chronic kidney disease stage IIIb -Creatinine slightly trended upwards today.  Monitor.  Anemia of chronic disease -From kidney disease.  Hemoglobin stable.  Monitor intermittently.  Hyponatremia -Resolved  Hypokalemia -Resolved  Thrombocytosis -Possibly reactive.  Monitor  Diabetes mellitus type 2 with hyperglycemia and hypoglycemia -A1c 9.7.  Continue CBGs with SSI.  Decrease Levemir to 15 units daily  Hypothyroidism -Continue levothyroxine   DVT prophylaxis: SCDs Code Status: Full Family Communication: Wife at bedside Disposition Plan: Status is: Inpatient Remains inpatient appropriate because: Of severity of  illness   Consultants: Pulmonary/ENT/GI  Procedures: Left-sided chest tube placement on 07/06/2022 EGD on 07/07/2022 Impression:               - Z-line regular, 47 cm from the incisors. No                            specimens collected.                           - Salmon-colored mucosa. Biopsied.                           - A suture was found in the stomach.                           -  Gastritis. Biopsied.                           - Duodenitis. No specimens collected.                           - Normal first portion of the duodenum and second                            portion of the duodenum. No specimens collected. Recommendation:           - Return patient to hospital ward for ongoing care.                           - Resume  previous diet indefinitely.                           - Await pathology results.                           - Use Prilosec (omeprazole) 20 mg PO BID for 8                            weeks.                           - Perform ambulatory esophageal manometry at                            appointment to be scheduled.  Antimicrobials:  Anti-infectives (From admission, onward)    Start     Dose/Rate Route Frequency Ordered Stop   07/11/22 0900  cefTRIAXone (ROCEPHIN) 2 g in sodium chloride 0.9 % 100 mL IVPB        2 g 200 mL/hr over 30 Minutes Intravenous Every 24 hours 07/10/22 1327     07/07/22 1600  metroNIDAZOLE (FLAGYL) IVPB 500 mg        500 mg 100 mL/hr over 60 Minutes Intravenous Every 12 hours 07/07/22 1433     07/07/22 1000  azithromycin (ZITHROMAX) 500 mg in sodium chloride 0.9 % 250 mL IVPB  Status:  Discontinued        500 mg 250 mL/hr over 60 Minutes Intravenous Every 24 hours 07/07/22 0912 07/10/22 1327   07/06/22 1000  azithromycin (ZITHROMAX) tablet 500 mg  Status:  Discontinued        500 mg Oral Daily 07/05/22 2339 07/07/22 0912   07/06/22 0900  cefTRIAXone (ROCEPHIN) 2 g in sodium chloride 0.9 % 100 mL IVPB        2 g 200 mL/hr over 30 Minutes Intravenous Every 24 hours 07/05/22 2339 07/10/22 0900   07/05/22 1800  cefTRIAXone (ROCEPHIN) 1 g in sodium chloride 0.9 % 100 mL IVPB        1 g 200 mL/hr over 30 Minutes Intravenous  Once 07/05/22 1747 07/05/22 1921   07/05/22 1800  azithromycin (ZITHROMAX) 500 mg in sodium chloride 0.9 % 250 mL IVPB        500 mg 250 mL/hr over 60 Minutes Intravenous  Once 07/05/22 1747 07/05/22 2006        Subjective: Patient seen and  examined at bedside.  Denies worsening shortness of breath, fever or vomiting; feels slightly better today Vitals:   07/11/22 0355 07/11/22 0358 07/11/22 0500 07/11/22 0734  BP:  98/71    Pulse: (!) 106 (!) 101    Resp:  19    Temp:  98 F (36.7 C)  97.9 F (36.6 C)  TempSrc:    Oral  SpO2:  99%     Weight:   88.9 kg   Height:        Intake/Output Summary (Last 24 hours) at 07/11/2022 0814 Last data filed at 07/11/2022 0730 Gross per 24 hour  Intake 10 ml  Output 1525 ml  Net -1515 ml    Filed Weights   07/07/22 0500 07/08/22 0500 07/11/22 0500  Weight: 83 kg 83.1 kg 88.9 kg    Examination:  General: On 0.5 L oxygen by nasal cannula.  No distress.   Respiratory: Bilateral decreased breath sounds at bases with scattered crackles.  Left-sided chest tube present. CVS: S1-S2; tachycardic  abdominal: Soft, nontender, still some distention; no organomegaly; bowel sounds are normally heard extremities: Trace lower extremity edema present; no clubbing   Data Reviewed: I have personally reviewed following labs and imaging studies  CBC: Recent Labs  Lab 07/05/22 1808 07/06/22 0157 07/07/22 0318 07/09/22 0337 07/10/22 0428 07/11/22 0417  WBC 23.7* 20.8* 17.3* 18.0* 15.7* 16.9*  NEUTROABS 21.8*  --  15.2* 15.8* 13.5* 14.5*  HGB 12.5* 10.5* 10.9* 10.5* 10.8* 10.9*  HCT 39.8 33.4* 33.3* 34.1* 34.7* 34.2*  MCV 89.2 90.5 87.6 90.9 89.4 89.1  PLT 679* 545* 483* 464* 495* 451*    Basic Metabolic Panel: Recent Labs  Lab 07/06/22 0157 07/07/22 0318 07/09/22 0337 07/10/22 0428 07/11/22 0417  NA 134* 133* 136 137 138  K 5.2* 4.2 4.6 4.5 4.7  CL 102 105 106 103 105  CO2 21* 22 24 22 25   GLUCOSE 223* 233* 160* 144* 74  BUN 34* 33* 19 22 24*  CREATININE 1.93* 1.85* 1.65* 1.87* 2.05*  CALCIUM 8.5* 8.3* 8.4* 8.4* 8.4*  MG  --  1.9 1.9 1.8 1.8    GFR: Estimated Creatinine Clearance: 41.2 mL/min (A) (by C-G formula based on SCr of 2.05 mg/dL (H)). Liver Function Tests: Recent Labs  Lab 07/05/22 1808  AST 28  ALT 23  ALKPHOS 101  BILITOT 0.5  PROT 7.6  ALBUMIN 3.3*    No results for input(s): "LIPASE", "AMYLASE" in the last 168 hours. No results for input(s): "AMMONIA" in the last 168 hours. Coagulation Profile: Recent Labs  Lab 07/05/22 1808  INR 1.4*     Cardiac Enzymes: No results for input(s): "CKTOTAL", "CKMB", "CKMBINDEX", "TROPONINI" in the last 168 hours. BNP (last 3 results) No results for input(s): "PROBNP" in the last 8760 hours. HbA1C: No results for input(s): "HGBA1C" in the last 72 hours.  CBG: Recent Labs  Lab 07/10/22 0855 07/10/22 1107 07/10/22 1603 07/10/22 2012 07/11/22 0731  GLUCAP 151* 125* 187* 150* 54*    Lipid Profile: No results for input(s): "CHOL", "HDL", "LDLCALC", "TRIG", "CHOLHDL", "LDLDIRECT" in the last 72 hours. Thyroid Function Tests: No results for input(s): "TSH", "T4TOTAL", "FREET4", "T3FREE", "THYROIDAB" in the last 72 hours. Anemia Panel: No results for input(s): "VITAMINB12", "FOLATE", "FERRITIN", "TIBC", "IRON", "RETICCTPCT" in the last 72 hours. Sepsis Labs: Recent Labs  Lab 07/05/22 1808 07/05/22 1945  LATICACIDVEN 1.1 0.6     Recent Results (from the past 240 hour(s))  Blood Culture (routine x 2)  Status: None   Collection Time: 07/05/22  6:08 PM   Specimen: BLOOD  Result Value Ref Range Status   Specimen Description   Final    BLOOD LEFT ANTECUBITAL Performed at [facility], [address] [place], [place] [address]    Special Requests   Final    Blood Culture adequate volume BOTTLES DRAWN AEROBIC AND ANAEROBIC Performed at [facility], [address], [place], [place] [address]    Culture   Final    NO GROWTH 5 DAYS Performed at [facility], [place] [address], [place], [place] [address]    Report Status 07/10/2022 FINAL  Final  Blood Culture (routine x 2)     Status: None   Collection Time: 07/05/22  6:08 PM   Specimen: BLOOD  Result Value Ref Range Status   Specimen Description   Final    BLOOD BLOOD RIGHT FOREARM Performed at [facility], [address], [place], [place] [address]    Special Requests   Final    Blood Culture adequate volume BOTTLES DRAWN AEROBIC AND ANAEROBIC Performed  at [facility], [address] [place], [place] [address]    Culture   Final    NO GROWTH 5 DAYS Performed at [facility], [place] [address] [place], [place] [address]    Report Status 07/10/2022 FINAL  Final  Body fluid culture w Gram Stain     Status: None (Preliminary result)   Collection Time: 07/06/22  4:02 PM   Specimen: Pleura; Body Fluid  Result Value Ref Range Status   Specimen Description PLEURAL  Final   Special Requests LEFT  Final   Gram Stain NO WBC SEEN NO ORGANISMS SEEN   Final   Culture   Final    NO GROWTH 3 DAYS Performed at [facility], [address], [place], [place] [address]    Report Status PENDING  Incomplete         Radiology Studies: DG Chest 1 View  Result Date: 07/10/2022 CLINICAL DATA:  Chest tube in place EXAM: CHEST  1 VIEW COMPARISON:  07/09/2022 FINDINGS: Cardiomegaly. Left-sided pigtail chest tube remains in position about the left lung base, with significantly improved aeration of the left lung and diminished volume of a left pleural effusion. No pneumothorax. Persistent elevation of the left hemidiaphragm. Very dense material again noted at the medial right lung base, this appearance generally suggesting barium aspirate. Osseous structures unremarkable. IMPRESSION: 1. Left-sided pigtail chest tube remains in position about the left lung base, with significantly improved aeration of the left lung and diminished volume of a left pleural effusion. No pneumothorax. 2. Very dense material again noted at the medial right lung base, this appearance generally suggesting barium aspirate. 3. Cardiomegaly. Electronically Signed   By: Delanna Ahmadi M.D.   On: 07/10/2022 11:36        Scheduled Meds:  enoxaparin (LOVENOX) injection  80 mg Subcutaneous Q12H   insulin aspart  0-15 Units Subcutaneous TID WC   insulin aspart  0-5 Units Subcutaneous QHS   insulin aspart  6 Units Subcutaneous TID WC   insulin detemir  18  Units Subcutaneous BID   levothyroxine  75 mcg Oral q morning   mouth rinse  15 mL Mouth Rinse 4 times per day   pantoprazole  40 mg Oral BID   sodium chloride flush  10 mL Intrapleural Q8H   Continuous Infusions:  cefTRIAXone (ROCEPHIN)  IV  metronidazole 500 mg (07/11/22 0509)          Aline August, MD [facility] 07/11/2022, 8:14 AM

## 2022-07-12 DIAGNOSIS — J90 Pleural effusion, not elsewhere classified: Secondary | ICD-10-CM | POA: Diagnosis not present

## 2022-07-12 DIAGNOSIS — J9601 Acute respiratory failure with hypoxia: Secondary | ICD-10-CM

## 2022-07-12 DIAGNOSIS — D638 Anemia in other chronic diseases classified elsewhere: Secondary | ICD-10-CM

## 2022-07-12 DIAGNOSIS — D72829 Elevated white blood cell count, unspecified: Secondary | ICD-10-CM

## 2022-07-12 DIAGNOSIS — J189 Pneumonia, unspecified organism: Secondary | ICD-10-CM | POA: Diagnosis not present

## 2022-07-12 LAB — BASIC METABOLIC PANEL WITH GFR
Anion gap: 8 (ref 5–15)
BUN: 25 mg/dL — ABNORMAL HIGH (ref 8–23)
CO2: 24 mmol/L (ref 22–32)
Calcium: 8.1 mg/dL — ABNORMAL LOW (ref 8.9–10.3)
Chloride: 103 mmol/L (ref 98–111)
Creatinine, Ser: 1.98 mg/dL — ABNORMAL HIGH (ref 0.61–1.24)
GFR, Estimated: 36 mL/min — ABNORMAL LOW (ref >60)
Glucose, Bld: 132 mg/dL — ABNORMAL HIGH (ref 70–99)
Potassium: 4.2 mmol/L (ref 3.5–5.1)
Sodium: 135 mmol/L (ref 135–145)

## 2022-07-12 LAB — CBC WITH DIFFERENTIAL/PLATELET
Abs Immature Granulocytes: 0.05 10*3/uL (ref 0.00–0.07)
Basophils Absolute: 0 10*3/uL (ref 0.0–0.1)
Basophils Relative: 0 %
Eosinophils Absolute: 0.1 10*3/uL (ref 0.0–0.5)
Eosinophils Relative: 2 %
HCT: 31.5 % — ABNORMAL LOW (ref 39.0–52.0)
Hemoglobin: 9.7 g/dL — ABNORMAL LOW (ref 13.0–17.0)
Immature Granulocytes: 1 %
Lymphocytes Relative: 8 %
Lymphs Abs: 0.7 10*3/uL (ref 0.7–4.0)
MCH: 27.6 pg (ref 26.0–34.0)
MCHC: 30.8 g/dL (ref 30.0–36.0)
MCV: 89.7 fL (ref 80.0–100.0)
Monocytes Absolute: 0.8 10*3/uL (ref 0.1–1.0)
Monocytes Relative: 9 %
Neutro Abs: 6.8 10*3/uL (ref 1.7–7.7)
Neutrophils Relative %: 80 %
Platelets: 408 10*3/uL — ABNORMAL HIGH (ref 150–400)
RBC: 3.51 MIL/uL — ABNORMAL LOW (ref 4.22–5.81)
RDW: 13.3 % (ref 11.5–15.5)
WBC: 8.5 10*3/uL (ref 4.0–10.5)
nRBC: 0 % (ref 0.0–0.2)

## 2022-07-12 LAB — MAGNESIUM: Magnesium: 1.7 mg/dL (ref 1.7–2.4)

## 2022-07-12 LAB — GLUCOSE, CAPILLARY
Glucose-Capillary: 135 mg/dL — ABNORMAL HIGH (ref 70–99)
Glucose-Capillary: 316 mg/dL — ABNORMAL HIGH (ref 70–99)
Glucose-Capillary: 216 mg/dL — ABNORMAL HIGH (ref 70–99)
Glucose-Capillary: 219 mg/dL — ABNORMAL HIGH (ref 70–99)

## 2022-07-12 MED ORDER — INSULIN ASPART 100 UNIT/ML IJ SOLN
3.0000 [IU] | Freq: Three times a day (TID) | INTRAMUSCULAR | Status: DC
  Administered 2022-07-12 – 2022-07-14 (×6): 3 [IU] via SUBCUTANEOUS

## 2022-07-12 MED ORDER — MELATONIN 3 MG PO TABS
3.0000 mg | ORAL_TABLET | Freq: Every day | ORAL | Status: DC
  Administered 2022-07-12 – 2022-07-13 (×2): 3 mg via ORAL
  Filled 2022-07-12 (×2): qty 1

## 2022-07-12 NOTE — Inpatient Diabetes Management (Signed)
Inpatient Diabetes Program Recommendations  AACE/ADA: New Consensus Statement on Inpatient Glycemic Control (2015)  Target Ranges:  Prepandial:   less than 140 mg/dL      Peak postprandial:   less than 180 mg/dL (1-2 hours)      Critically ill patients:  140 - 180 mg/dL   Lab Results  Component Value Date   GLUCAP 316 (H) 07/12/2022   HGBA1C 9.7 (H) 07/06/2022    Review of Glycemic Control  Latest Reference Range & Units 07/12/22 07:30 07/12/22 11:28  Glucose-Capillary 70 - 99 mg/dL 135 (H) 316 (H)  (H): Data is abnormally high  Diabetes history: DM2 Outpatient Diabetes medications: Levemir 22 units BID, Prandin 1 mg BID, Trulicity 1.5 mg every Sunday.   Current orders for Inpatient glycemic control: Levemir 15 units QD, Novolog 0-15 units TID and 0-5 QHS  Inpatient Diabetes Program Recommendations:    Novolog 3 units tid if consumes at least 50%.  Will continue to follow while inpatient.  Thank you, Reche Dixon, MSN, [place] Diabetes Coordinator Inpatient Diabetes Program [phone] (team pager from 8a-5p)

## 2022-07-12 NOTE — Progress Notes (Signed)
Mobility Specialist Progress Note   07/12/22 1119  Mobility  Activity Ambulated with assistance in hallway  Level of Assistance Contact guard assist, steadying assist  Assistive Device Front wheel walker  Distance Ambulated (ft) 236 ft  Activity Response Tolerated well  $Mobility charge 1 Mobility   Pre Mobility: 109 HR, 121/68 BP, 96% SpO2 on 1LO2 During Mobility: 128 HR, 92%SpO2 on RA Post Mobility: 112 HR, 94% SpO2 on RA  Received pt in bed on 1LO2 c/o tiredness but agreeable. Pt asymptomatic throughout session while maintaining >91% SpO2 while on RA. Weaned down and kept on RA per Producer, television/film/video. Left back in bed w/ call bell in reach and all needs met.    Holland Falling Mobility Specialist Acute Rehab Office:  [phone]

## 2022-07-12 NOTE — Progress Notes (Signed)
Speech Language Pathology Treatment: Dysphagia  Patient Details Name: [name] MRN: [id number] DOB: [birth year] Today's Date: 07/12/2022 Time: 0832-0903 SLP Time Calculation (min) (ACUTE ONLY): 31 min  Assessment / Plan / Recommendation Clinical Impression  Pt alert and eager to participate in skilled dysphagia therapy this am. Wife at bedside inquiring regarding pt aspiration risk and how to mitigate associated medical complications. Provided results of recent MBSS (05/12/22), which revealed silent aspiration of all consistencies of liquid. Chin tuck with solids to reduce residuals, frequent throat clearing with liquids to eject aspirate and strict oral care regimen recommended at that time with consumption of regular/thin liquid diet. This date, after brief instruction in use of chin tuck with solids and frequent throat clear/cough with sips of liquid, pt did so with 90% accuracy with POs given minimal verbal/visual cues. He subjectively reported minimal-no feeling of stasis in throat with use of chin tuck with solids. Introduced effortful swallow (outside of POs) as an exercise to strengthen pharyngeal muscles. Pt demonstrated this exercise with 100% accuracy after instruction. Recommend continue regular/thin liquid diet with use of provided compensatory strategies and thorough oral care regimen in order to reduce risk for PNA/complications associated with chronic aspiration. Will continue f/u during acute stay, although pt would benefit from intensive swallowing exercise program with OP SLP upon discharge.    HPI HPI: 66 y.o. male presented with worsening shortness of breath and cough. Chest CT 11/1: "Interval worsening of diffuse bronchial wall thickening with  right lower lobe and right middle lobe development of atypical  infection." OP MBS 05/12/2022 with silent aspiration of all consistencies of liquid.  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      SLP Plan  Continue with current plan of care      Recommendations for follow up therapy are one component of a multi-disciplinary discharge planning process, led by the attending physician.  Recommendations may be updated based on patient status, additional functional criteria and insurance authorization.    Recommendations  Diet recommendations: Regular;Thin liquid Medication Administration: Other (Comment) (as tolerated) Supervision: Patient able to self feed Compensations: Slow rate;Small sips/bites;Chin tuck;Follow solids with liquid;Clear throat intermittently Postural Changes and/or Swallow Maneuvers: Seated upright 90 degrees                Oral Care Recommendations: Other (Comment) (oral care prior to POs/meals) Follow Up Recommendations: Outpatient SLP Assistance recommended at discharge: None SLP Visit Diagnosis: Dysphagia, oropharyngeal phase (R13.12) Plan: Continue with current plan of care            Ellwood Dense, [place], [place] Office Number: [place]  07/12/2022, 9:08 AM

## 2022-07-12 NOTE — Progress Notes (Signed)
Physical Therapy Treatment Patient Details Name: [name] MRN: [id number] DOB: [birth year] Today's Date: 07/12/2022   History of Present Illness 66 y.o. male who presented with worsening shortness of breath and cough on 07/05/22. Chest x-ray showed left-sided pneumonia with large effusion with possibility of empyema. Underwent pigtail catheter placement on 07/06/2022. Underwent EGD on 07/07/2022. Now undergoing Pleural Fibrinolysis. 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    PT Comments    Pt making good progress. His vital signs are stable on RA.  Pt with only light use of RW, mainly to manage lines.  He does fatigue easier than baseline but demonstrated good balance. Pt did fatigue before getting to rehab gym to attempt stairs. Plan to attempt stairs next visit.      Recommendations for follow up therapy are one component of a multi-disciplinary discharge planning process, led by the attending physician.  Recommendations may be updated based on patient status, additional functional criteria and insurance authorization.  Follow Up Recommendations  Outpatient PT (vs no PT)     Assistance Recommended at Discharge PRN  Patient can return home with the following Assistance with cooking/housework;Assist for transportation;Help with stairs or ramp for entrance   Equipment Recommendations  Rolling walker (2 wheels)    Recommendations for Other Services       Precautions / Restrictions Precautions Precautions: Fall Precaution Comments: chest tube     Mobility  Bed Mobility Overal bed mobility: Needs Assistance Bed Mobility: Supine to Sit, Sit to Supine     Supine to sit:  Supervision Sit to supine: Supervision        Transfers Overall transfer level: Needs assistance Equipment used: Rolling walker (2 wheels) Transfers: Sit to/from Stand Sit to Stand: Supervision                Ambulation/Gait Ambulation/Gait assistance: Supervision Gait Distance (Feet): 300 Feet Assistive device: Rolling walker (2 wheels) Gait Pattern/deviations: Step-through pattern       General Gait Details: Pt only lightly using RW, mainly has RW to carry chest tube .  Ambulating at good speed with min cues for posture.  Does fatigue easier than baseline   Stairs Stairs:  (fatigued before getting to rehab gym; attempt next visit)           Wheelchair Mobility    Modified Rankin (Stroke Patients Only)       Balance Overall balance assessment: Needs assistance Sitting-balance support: No upper extremity supported Sitting balance-Leahy Scale: Good     Standing balance support: No upper extremity supported Standing balance-Leahy Scale: Good Standing balance comment: standing and turnign without AD; RW for ambulation for energy conservation and line management                            Cognition Arousal/Alertness: Awake/alert Behavior During Therapy: WFL for tasks assessed/performed Overall Cognitive Status: Within Functional Limits for tasks assessed                                 General Comments: pleasant and motivated        Exercises      General Comments General comments (skin  integrity, edema, etc.): Pt on RA with sats 92% or >.  HR up to 117 bpm with ambulation      Pertinent Vitals/Pain Pain Assessment Pain Assessment: No/denies pain    Home Living                          Prior Function            PT Goals (current goals can now be found in the care plan section) Progress towards PT goals: Progressing toward goals    Frequency    Min 3X/week      PT Plan Discharge plan needs to  be updated    Co-evaluation              AM-PAC PT "6 Clicks" Mobility   Outcome Measure  Help needed turning from your back to your side while in a flat bed without using bedrails?: None Help needed moving from lying on your back to sitting on the side of a flat bed without using bedrails?: A Little Help needed moving to and from a bed to a chair (including a wheelchair)?: A Little Help needed standing up from a chair using your arms (e.g., wheelchair or bedside chair)?: A Little Help needed to walk in hospital room?: A Little Help needed climbing 3-5 steps with a railing? : A Little 6 Click Score: 19    End of Session   Activity Tolerance: Patient tolerated treatment well Patient left: in bed;with family/visitor present;with call bell/phone within reach (chest tube reconnected to suction) Nurse Communication: Mobility status PT Visit Diagnosis: Muscle weakness (generalized) (M62.81);Other abnormalities of gait and mobility (R26.89)     Time: 1219-7588 PT Time Calculation (min) (ACUTE ONLY): 20 min  Charges:  $Gait Training: 8-22 mins                     Abran Richard, PT Acute Rehab [facility] [place] 07/12/2022, 5:21 PM

## 2022-07-12 NOTE — Progress Notes (Addendum)
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  Renal cell carcinoma with right nephrectomy 2021 per Dr. Alinda Money.  Ongoing evaluation for dysphagia with barium swallow indicative of chronic aspiration and narrowing of of the esophagus.  Suspect there is a component of aspiration.  Pulmonary critical care asked to evaluate for left effusion and suspected left empyema.  Agree with interim empirical antimicrobial therapy.  Pertinent  Medical History   Past Medical History:  Diagnosis Date   Alopecia    s/p chemotherapy   Arthritis    Bursitis    pt. denies   Cancer ([place]) 10/2007    tongue/squamous cell,stg IV,HPV positive   Diabetes mellitus without complication ([place])    takes Amaryl,Januvia,and Metformin,daily   DVT, lower extremity ([place]) 2009   side effect from chemo and has blood clot left leg-wears compression stockings;was on Coumadin for 77months and has been off 5 yrs   History of chemotherapy    Cisplatin/Taxotere,5-FU   History of radiation therapy 01/28/08-03/11/08   left base tongue   History of shingles    Hyperlipidemia    takes Vytorin daily   Hypothyroidism    takes Synthroid daily   Joint pain    Joint swelling    Neuropathy    left foot   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 denies   Right kidney mass    Seborrheic keratosis    multiple on back     Significant Hospital Events: Including procedures, antibiotic start and stop dates in addition to other pertinent events   07/06/2022 left  chest tube 07/07/2022 EGD negative findings 07/10/2022 completion of 3 thrombolytic injections via chest tube Limits on 7/23 chest x-ray stable, CT scan shows improved loculated effusion with now small effusion is more appropriate lead layering due to gravity with hydropneumothorax  Interim History / Subjective:  Over 300 cc of chest tube output last 24 hours. Objective   Blood pressure 100/66, pulse (!) 102, temperature 97.8 F (36.6 C), temperature source Oral, resp. rate 15, height 6\' 3"  (1.905 m), weight 88.9 kg, SpO2 96 %.        Intake/Output Summary (Last 24 hours) at 07/12/2022 1530 Last data filed at 07/12/2022 0800 Gross per 24 hour  Intake 520 ml  Output 230 ml  Net 290 ml    Filed Weights   07/07/22 0500 07/08/22 0500 07/11/22 0500  Weight: 83 kg 83.1 kg 88.9 kg    Examination: General: Thin male no acute distress, asleep in bed HEENT: MM pink/moist no JVD is appreciated Neuro: Asleep, breathing spontaneously PULM: Normal work of breathing, on room air, chest tube with interval drainage noted in atrium GI: soft, bsx4 active  GU: Voids Extremities: warm/dry, negative edema  Skin: no rashes or lesions  Recent Labs  Lab 07/10/22 0428 07/11/22 0417 07/12/22 0440  NA 137 138 135  K 4.5 4.7 4.2  CL 103 105 103  CO2 22 25 24   BUN 22 24* 25*  CREATININE 1.87* 2.05* 1.98*  GLUCOSE 144* 74 132*    Recent Labs  Lab 07/10/22 0428 07/11/22 0417 07/12/22 0440  HGB 10.8* 10.9* 9.7*  HCT 34.7* 34.2* 31.5*  WBC 15.7* 16.9* 8.5  PLT 495* 451* 408*      Resolved Hospital Problem list     Assessment & Plan:   Loculated parapneumonic pleural effusion/empyema: Nothing on Gram stain but drainage appears very thick and purulent.  Neutrophil predominant, exudative.  Chest x-ray improving with lytics.  WBC trending down.  CT scan improved overall. -- Chest x-ray appearance and drainage improved with lytics x 3 -- Continue antibiotics -- Chest tube to suction --  Daily chest x-ray while chest tube in place --We will plan to remove chest tube drains output is less than 100 cc 24-hour period --Repeat Chest CT to re-assess appearance of effusion 11/7 reveals improved loculated effusion with minimal areas of loculation and persistent hydropneumothorax which is encouraging, no further interventions recommended  Best Practice (right click and "Reselect all SmartList Selections" daily)  Per primary  Critical care time: na     Lanier Clam, MD [place] Please consult Amion 07/12/2022, 3:30 PM    07/12/2022, 3:30 PM

## 2022-07-12 NOTE — Progress Notes (Signed)
PROGRESS NOTE    [name]  YIF:[id number] DOB: 07/20/1956 DOA: 07/05/2022 PCP: [name], MD   Brief Narrative:  66 y.o. 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  On presentation, he was hypoxic with oxygen saturation in the 80s on room air along with tachycardia with creatinine of 2.05, WBC of 23.7.  Chest x-ray showed left-sided pneumonia with large effusion with possibility of empyema.  CTA of the chest showed no pulm embolism but showed possible right lower lobe and right middle lobe atypical infection along with moderate left hydropneumothorax.  Pulmonary was consulted and patient underwent pigtail catheter placement on 07/06/2022.  GI consulted for dysphagia: Underwent EGD on 07/07/2022.  Assessment & Plan:   Acute respiratory failure with hypoxia Bilateral community-acquired pneumonia with possibility of aspiration pneumonia as well along with concerns for left-sided empyema Sepsis: present on admission; from above Possible left-sided empyema versus hydropneumothorax -Imaging as above.  Currently on Rocephin and Flagyl.  Has completed 5-day course of Zithromax. -Pulmonary following: Status post pigtail pleural catheter placement by pulmonary on 07/06/2022.  Followed further recommendations.  Underwent pleural fibrinolysis on 07/08/2022, 07/09/2022 and 07/10/2022 by pulmonary.  CT chest without contrast on 07/11/2022 showed diminished volume of small loculated left hydropneumothorax; new airspace opacity/consolidation in the right medial right lower lobe; new moderate pericardial effusion -Currently still intermittently on  0.5 L oxygen via nasal cannula. -Cultures have remained negative so far. -sepsis has resolved  Leukocytosis -Resolved  Ongoing dysphagia History of squamous cell carcinoma of tongue status post chemotherapy and radiation -Patient has had ongoing work-up for dysphagia with outpatient barium swallow indicating chronic aspiration and narrowing of esophagus. -SLP following.  ENT evaluation appreciated. -Status post EGD on 07/07/2022: Showed gastritis/duodenitis: Biopsied.  GI recommends PPI twice daily for 8 weeks and cleared the patient to resume anticoagulation.  Soft diet has been ordered.  Patient will need outpatient manometry testing if symptoms of dysphagia persist.  GI signed off on 07/08/2022.  Chronic kidney disease stage IIIb -Creatinine stable.  Monitor.  Anemia of chronic disease -From kidney disease.  Hemoglobin stable.  Monitor intermittently.  Hyponatremia -Resolved  Hypokalemia -Resolved  Thrombocytosis -Possibly reactive.  Monitor  Diabetes mellitus type 2 with hyperglycemia and hypoglycemia -A1c 9.7.  Continue CBGs with SSI.  Continue Levemir 15 units daily for now.  Will watch out for episodes of hypoglycemia.  Hypothyroidism -Continue levothyroxine   DVT prophylaxis: SCDs Code Status: Full Family Communication: Wife at bedside Disposition Plan: Status is: Inpatient Remains inpatient appropriate because: Of severity of  illness   Consultants: Pulmonary/ENT/GI  Procedures: Left-sided chest tube placement on 07/06/2022 EGD on 07/07/2022 Impression:               - Z-line regular, 47 cm from the incisors. No                            specimens collected.                           - Salmon-colored mucosa. Biopsied.                           -  A suture was found in the stomach.                           - Gastritis. Biopsied.                           - Duodenitis. No specimens collected.                           - Normal first portion of the duodenum and  second                            portion of the duodenum. No specimens collected. Recommendation:           - Return patient to hospital ward for ongoing care.                           - Resume previous diet indefinitely.                           - Await pathology results.                           - Use Prilosec (omeprazole) 20 mg PO BID for 8                            weeks.                           - Perform ambulatory esophageal manometry at                            appointment to be scheduled.  Antimicrobials:  Anti-infectives (From admission, onward)    Start     Dose/Rate Route Frequency Ordered Stop   07/11/22 0900  cefTRIAXone (ROCEPHIN) 2 g in sodium chloride 0.9 % 100 mL IVPB        2 g 200 mL/hr over 30 Minutes Intravenous Every 24 hours 07/10/22 1327     07/07/22 1600  metroNIDAZOLE (FLAGYL) IVPB 500 mg        500 mg 100 mL/hr over 60 Minutes Intravenous Every 12 hours 07/07/22 1433     07/07/22 1000  azithromycin (ZITHROMAX) 500 mg in sodium chloride 0.9 % 250 mL IVPB  Status:  Discontinued        500 mg 250 mL/hr over 60 Minutes Intravenous Every 24 hours 07/07/22 0912 07/10/22 1327   07/06/22 1000  azithromycin (ZITHROMAX) tablet 500 mg  Status:  Discontinued        500 mg Oral Daily 07/05/22 2339 07/07/22 0912   07/06/22 0900  cefTRIAXone (ROCEPHIN) 2 g in sodium chloride 0.9 % 100 mL IVPB        2 g 200 mL/hr over 30 Minutes Intravenous Every 24 hours 07/05/22 2339 07/10/22 0900   07/05/22 1800  cefTRIAXone (ROCEPHIN) 1 g in sodium chloride 0.9 % 100 mL IVPB        1 g 200 mL/hr over 30 Minutes Intravenous  Once 07/05/22 1747 07/05/22 1921   07/05/22 1800  azithromycin (ZITHROMAX) 500 mg in sodium chloride 0.9 % 250  mL IVPB        500 mg 250 mL/hr over 60 Minutes Intravenous  Once 07/05/22 1747 07/05/22 2006        Subjective: Patient seen and examined at bedside.  No fever, vomiting, worsening shortness of breath reported.  Breathing is  improving. Vitals:   07/11/22 1600 07/11/22 2000 07/12/22 0400 07/12/22 0728  BP:  106/60 127/68 136/70  Pulse: (!) 103 99 (!) 102 (!) 105  Resp: 20 19 20 16   Temp:  98.4 F (36.9 C) 98.6 F (37 C) 98.2 F (36.8 C)  TempSrc:  Oral Oral Oral  SpO2: 99% 97% 96% 99%  Weight:      Height:        Intake/Output Summary (Last 24 hours) at 07/12/2022 0823 Last data filed at 07/11/2022 1900 Gross per 24 hour  Intake 400 ml  Output 230 ml  Net 170 ml    Filed Weights   07/07/22 0500 07/08/22 0500 07/11/22 0500  Weight: 83 kg 83.1 kg 88.9 kg    Examination:  General: No acute distress.  Intermittently requiring 0.5 L oxygen via nasal cannula.   Respiratory: Decreased breath sounds at bases bilaterally with some crackles.  Left-sided chest tube present. CVS: Mild intermittent tachycardia present; S1 and S2 heard  abdominal: Soft, nontender, distended mildly; no organomegaly; normal bowel sounds heard  extremities: No cyanosis; mild lower extremity edema present  Data Reviewed: I have personally reviewed following labs and imaging studies  CBC: Recent Labs  Lab 07/07/22 0318 07/09/22 0337 07/10/22 0428 07/11/22 0417 07/12/22 0440  WBC 17.3* 18.0* 15.7* 16.9* 8.5  NEUTROABS 15.2* 15.8* 13.5* 14.5* 6.8  HGB 10.9* 10.5* 10.8* 10.9* 9.7*  HCT 33.3* 34.1* 34.7* 34.2* 31.5*  MCV 87.6 90.9 89.4 89.1 89.7  PLT 483* 464* 495* 451* 408*    Basic Metabolic Panel: Recent Labs  Lab 07/07/22 0318 07/09/22 0337 07/10/22 0428 07/11/22 0417 07/12/22 0440  NA 133* 136 137 138 135  K 4.2 4.6 4.5 4.7 4.2  CL 105 106 103 105 103  CO2 22 24 22 25 24   GLUCOSE 233* 160* 144* 74 132*  BUN 33* 19 22 24* 25*  CREATININE 1.85* 1.65* 1.87* 2.05* 1.98*  CALCIUM 8.3* 8.4* 8.4* 8.4* 8.1*  MG 1.9 1.9 1.8 1.8 1.7    GFR: Estimated Creatinine Clearance: 42.7 mL/min (A) (by C-G formula based on SCr of 1.98 mg/dL (H)). Liver Function Tests: Recent Labs  Lab 07/05/22 1808  AST 28  ALT 23   ALKPHOS 101  BILITOT 0.5  PROT 7.6  ALBUMIN 3.3*    No results for input(s): "LIPASE", "AMYLASE" in the last 168 hours. No results for input(s): "AMMONIA" in the last 168 hours. Coagulation Profile: Recent Labs  Lab 07/05/22 1808  INR 1.4*    Cardiac Enzymes: No results for input(s): "CKTOTAL", "CKMB", "CKMBINDEX", "TROPONINI" in the last 168 hours. BNP (last 3 results) No results for input(s): "PROBNP" in the last 8760 hours. HbA1C: No results for input(s): "HGBA1C" in the last 72 hours.  CBG: Recent Labs  Lab 07/11/22 0854 07/11/22 1118 07/11/22 1605 07/11/22 2007 07/12/22 0730  GLUCAP 182* 294* 161* 174* 135*    Lipid Profile: No results for input(s): "CHOL", "HDL", "LDLCALC", "TRIG", "CHOLHDL", "LDLDIRECT" in the last 72 hours. Thyroid Function Tests: No results for input(s): "TSH", "T4TOTAL", "FREET4", "T3FREE", "THYROIDAB" in the last 72 hours. Anemia Panel: No results for input(s): "VITAMINB12", "FOLATE", "FERRITIN", "TIBC", "IRON", "RETICCTPCT" in the last 72 hours. Sepsis Labs: Recent  Labs  Lab 07/05/22 1808 07/05/22 1945  LATICACIDVEN 1.1 0.6     Recent Results (from the past 240 hour(s))  Blood Culture (routine x 2)     Status: None   Collection Time: 07/05/22  6:08 PM   Specimen: BLOOD  Result Value Ref Range Status   Specimen Description   Final    BLOOD LEFT ANTECUBITAL Performed at [facility], [address] [place], [place] [address]    Special Requests   Final    Blood Culture adequate volume BOTTLES DRAWN AEROBIC AND ANAEROBIC Performed at [facility], [address] [place], [place] [address]    Culture   Final    NO GROWTH 5 DAYS Performed at [facility], [place] [address] [place], [place] [address]    Report Status 07/10/2022 FINAL  Final  Blood Culture (routine x 2)     Status: None   Collection Time: 07/05/22  6:08 PM   Specimen: BLOOD  Result Value Ref Range Status    Specimen Description   Final    BLOOD BLOOD RIGHT FOREARM Performed at [facility], [address], [place], [place] [address]    Special Requests   Final    Blood Culture adequate volume BOTTLES DRAWN AEROBIC AND ANAEROBIC Performed at [facility], [address] [place], [place] [address]    Culture   Final    NO GROWTH 5 DAYS Performed at [facility], [place] [address], [place], [place] [address]    Report Status 07/10/2022 FINAL  Final  Body fluid culture w Gram Stain     Status: None   Collection Time: 07/06/22  4:02 PM   Specimen: Pleura; Body Fluid  Result Value Ref Range Status   Specimen Description PLEURAL  Final   Special Requests LEFT  Final   Gram Stain NO WBC SEEN NO ORGANISMS SEEN   Final   Culture   Final    NO GROWTH 3 DAYS Performed at [facility], [address], [place], [place] [address]    Report Status 07/11/2022 FINAL  Final         Radiology Studies: CT CHEST WO CONTRAST  Result Date: 07/11/2022 CLINICAL DATA:  Pleural effusion, malignancy suspected, personal history of right renal cell carcinoma and throat cancer * Tracking Code: BO * EXAM: CT CHEST WITHOUT CONTRAST TECHNIQUE: Multidetector CT imaging of the chest was performed following the standard protocol without IV contrast. RADIATION DOSE REDUCTION: This exam was performed according to the departmental dose-optimization program which includes automated exposure control, adjustment of the mA and/or kV according to patient size and/or use of iterative reconstruction technique. COMPARISON:  07/05/2022, 06/02/2022 FINDINGS: Cardiovascular: Aortic atherosclerosis. Cardiomegaly. Three-vessel coronary artery calcifications. New, moderate pericardial effusion. Enlargement of the main pulmonary artery measuring up to 4.2 cm in caliber. Mediastinum/Nodes: Unchanged enlarged mediastinal and left hilar lymph nodes, largest prevascular nodes measuring up to  1.4 x 1.0 cm (series 3, image 54). Thyroid gland, trachea, and esophagus demonstrate no significant findings. Lungs/Pleura: Diminished volume of a now small, loculated left hydropneumothorax with left-sided pigtail chest tube positioned about the anterior left lung base (series 3, image 93). Extensive left-sided pleural thickening, particularly about the left lung base. New, heterogeneous airspace opacity and consolidation about the medial right lower lobe (series 4, image 100). Unchanged, dense centrilobular radiopaque material in the right lung base, presumably reflecting chronic aspirate of barium or other radiodense material (series 4, image 105). Upper  Abdomen: No acute abnormality. Partially imaged postoperative findings of right nephrectomy in the upper abdomen. Musculoskeletal: No chest wall abnormality. No acute osseous findings. Disc degenerative disease and bridging osteophytosis throughout the thoracic spine, in keeping with DISH. IMPRESSION: 1. Diminished volume of a now small, loculated left hydropneumothorax with left-sided pigtail chest tube positioned about the anterior left lung base. 2. Extensive left-sided pleural thickening, particularly about the left lung base. 3. New, heterogeneous airspace opacity and consolidation about the medial right lower lobe, consistent with infection or aspiration. 4. Unchanged enlarged mediastinal and left hilar lymph nodes, nonspecific. Nodal metastatic disease not excluded given personal history of malignancy. 5. New, moderate pericardial effusion. 6. Enlargement of the main pulmonary artery, as can be seen in pulmonary hypertension. 7. Coronary artery disease. Aortic Atherosclerosis (ICD10-I70.0). Electronically Signed   By: Delanna Ahmadi M.D.   On: 07/11/2022 10:58   DG CHEST PORT 1 VIEW  Result Date: 07/11/2022 CLINICAL DATA:  Chest tube,loculated pleural [id number] Loculated pleural effusion [id number] EXAM: PORTABLE CHEST 1 VIEW COMPARISON:  07/10/2022  FINDINGS: Stable enlarged cardiac silhouette. LEFT chest tube in place. Persistent LEFT effusion slightly decreased in volume compared to prior. High-density material in the medial RIGHT lower lobe again noted. IMPRESSION: 1. No pneumothorax. 2. Slight increase in volume of LEFT pleural effusion with LEFT basilar chest tube in place. Electronically Signed   By: Suzy Bouchard M.D.   On: 07/11/2022 09:10   DG Chest 1 View  Result Date: 07/10/2022 CLINICAL DATA:  Chest tube in place EXAM: CHEST  1 VIEW COMPARISON:  07/09/2022 FINDINGS: Cardiomegaly. Left-sided pigtail chest tube remains in position about the left lung base, with significantly improved aeration of the left lung and diminished volume of a left pleural effusion. No pneumothorax. Persistent elevation of the left hemidiaphragm. Very dense material again noted at the medial right lung base, this appearance generally suggesting barium aspirate. Osseous structures unremarkable. IMPRESSION: 1. Left-sided pigtail chest tube remains in position about the left lung base, with significantly improved aeration of the left lung and diminished volume of a left pleural effusion. No pneumothorax. 2. Very dense material again noted at the medial right lung base, this appearance generally suggesting barium aspirate. 3. Cardiomegaly. Electronically Signed   By: Delanna Ahmadi M.D.   On: 07/10/2022 11:36        Scheduled Meds:  enoxaparin (LOVENOX) injection  80 mg Subcutaneous Q12H   insulin aspart  0-15 Units Subcutaneous TID WC   insulin aspart  0-5 Units Subcutaneous QHS   insulin detemir  15 Units Subcutaneous Daily   levothyroxine  75 mcg Oral q morning   mouth rinse  15 mL Mouth Rinse 4 times per day   pantoprazole  40 mg Oral BID   sodium chloride flush  10 mL Intrapleural Q8H   Continuous Infusions:  cefTRIAXone (ROCEPHIN)  IV 2 g (07/12/22 9935)   metronidazole 500 mg (07/12/22 0507)          Aline August, MD [facility] 07/12/2022, 8:23 AM

## 2022-07-13 ENCOUNTER — Inpatient Hospital Stay (HOSPITAL_COMMUNITY): Payer: 59

## 2022-07-13 LAB — CBC WITH DIFFERENTIAL/PLATELET
Abs Immature Granulocytes: 0.05 10*3/uL (ref 0.00–0.07)
Basophils Absolute: 0 10*3/uL (ref 0.0–0.1)
Basophils Relative: 0 %
Eosinophils Absolute: 0.2 10*3/uL (ref 0.0–0.5)
Eosinophils Relative: 2 %
HCT: 31.7 % — ABNORMAL LOW (ref 39.0–52.0)
Hemoglobin: 9.7 g/dL — ABNORMAL LOW (ref 13.0–17.0)
Immature Granulocytes: 1 %
Lymphocytes Relative: 9 %
Lymphs Abs: 0.7 10*3/uL (ref 0.7–4.0)
MCH: 27.7 pg (ref 26.0–34.0)
MCHC: 30.6 g/dL (ref 30.0–36.0)
MCV: 90.6 fL (ref 80.0–100.0)
Monocytes Absolute: 0.6 10*3/uL (ref 0.1–1.0)
Monocytes Relative: 9 %
Neutro Abs: 5.7 10*3/uL (ref 1.7–7.7)
Neutrophils Relative %: 79 %
Platelets: 395 10*3/uL (ref 150–400)
RBC: 3.5 MIL/uL — ABNORMAL LOW (ref 4.22–5.81)
RDW: 13.4 % (ref 11.5–15.5)
WBC: 7.3 10*3/uL (ref 4.0–10.5)
nRBC: 0 % (ref 0.0–0.2)

## 2022-07-13 LAB — MAGNESIUM: Magnesium: 1.9 mg/dL (ref 1.7–2.4)

## 2022-07-13 LAB — BASIC METABOLIC PANEL
Anion gap: 9 (ref 5–15)
BUN: 22 mg/dL (ref 8–23)
CO2: 24 mmol/L (ref 22–32)
Calcium: 8 mg/dL — ABNORMAL LOW (ref 8.9–10.3)
Chloride: 101 mmol/L (ref 98–111)
Creatinine, Ser: 1.98 mg/dL — ABNORMAL HIGH (ref 0.61–1.24)
GFR, Estimated: 36 mL/min — ABNORMAL LOW (ref >60)
Glucose, Bld: 128 mg/dL — ABNORMAL HIGH (ref 70–99)
Potassium: 4.6 mmol/L (ref 3.5–5.1)
Sodium: 134 mmol/L — ABNORMAL LOW (ref 135–145)

## 2022-07-13 LAB — GLUCOSE, CAPILLARY
Glucose-Capillary: 161 mg/dL — ABNORMAL HIGH (ref 70–99)
Glucose-Capillary: 274 mg/dL — ABNORMAL HIGH (ref 70–99)
Glucose-Capillary: 151 mg/dL — ABNORMAL HIGH (ref 70–99)
Glucose-Capillary: 106 mg/dL — ABNORMAL HIGH (ref 70–99)

## 2022-07-13 MED ORDER — REPAGLINIDE 1 MG PO TABS
1.0000 mg | ORAL_TABLET | Freq: Two times a day (BID) | ORAL | Status: DC
  Administered 2022-07-13 – 2022-07-14 (×2): 1 mg via ORAL
  Filled 2022-07-13 (×3): qty 1

## 2022-07-13 MED ORDER — METRONIDAZOLE 500 MG PO TABS
500.0000 mg | ORAL_TABLET | Freq: Two times a day (BID) | ORAL | Status: DC
  Administered 2022-07-13: 500 mg via ORAL
  Filled 2022-07-13: qty 1

## 2022-07-13 MED ORDER — DULAGLUTIDE 1.5 MG/0.5ML ~~LOC~~ SOAJ: 1.5000 mg | SUBCUTANEOUS | Status: DC

## 2022-07-13 NOTE — Progress Notes (Signed)
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  Renal cell carcinoma with right nephrectomy 2021 per Dr. Alinda Money.  Ongoing evaluation for dysphagia with barium swallow indicative of chronic aspiration and narrowing of of the esophagus.  Suspect there is a component of aspiration.  Pulmonary critical care asked to evaluate for left effusion and suspected left empyema.  Agree with interim empirical antimicrobial therapy.  Pertinent  Medical History   Past Medical History:  Diagnosis Date   Alopecia    s/p chemotherapy   Arthritis    Bursitis    pt. denies   Cancer ([place]) 10/2007    tongue/squamous cell,stg IV,HPV positive   Diabetes mellitus without complication ([place])    takes Amaryl,Januvia,and Metformin,daily   DVT, lower extremity ([place]) 2009   side effect from chemo and has blood clot left leg-wears compression stockings;was on Coumadin for 20months and has been off 5 yrs   History of chemotherapy    Cisplatin/Taxotere,5-FU   History of radiation therapy 01/28/08-03/11/08   left base tongue   History of shingles    Hyperlipidemia    takes Vytorin daily   Hypothyroidism    takes Synthroid daily   Joint pain    Joint swelling    Neuropathy    left foot   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 denies   Right kidney mass    Seborrheic keratosis    multiple on back     Significant Hospital Events: Including procedures, antibiotic start and stop dates in addition to other pertinent events   07/06/2022 left  chest tube 07/07/2022 EGD negative findings 07/10/2022 completion of 3 thrombolytic injections via chest tube Limits on 7/23 chest x-ray stable, CT scan shows improved loculated effusion with now small effusion is more appropriate lead layering due to gravity with hydropneumothorax  Interim History / Subjective:  Over 490 cc of chest tube output last 24 hours. Objective   Blood pressure 118/71, pulse 100, temperature 98 F (36.7 C), temperature source Oral, resp. rate 19, height 6\' 3"  (1.905 m), weight 88.9 kg, SpO2 94 %.        Intake/Output Summary (Last 24 hours) at 07/13/2022 1118 Last data filed at 07/13/2022 0507 Gross per 24 hour  Intake 500 ml  Output 1530 ml  Net -1030 ml   Filed Weights   07/07/22 0500 07/08/22 0500 07/11/22 0500  Weight: 83 kg 83.1 kg 88.9 kg    Examination: General: Well-developed male HEENT: MM pink/moist no JVD is appreciated Neuro: Grossly intact without focal defect CV: Heart sounds are regular PULM: Diminished in the bases chest tube without air leak Currently on room air  GI: soft, bsx4 active  GU: Voids Extremities: warm/dry,  edema  Skin: no rashes or lesions    Recent Labs  Lab 07/11/22 0417 07/12/22 0440 07/13/22 0419  NA 138 135 134*  K 4.7 4.2 4.6  CL 105 103 101  CO2 25 24 24   BUN 24* 25* 22  CREATININE 2.05* 1.98* 1.98*  GLUCOSE 74 132* 128*   Recent Labs  Lab  07/11/22 0417 07/12/22 0440 07/13/22 0419  HGB 10.9* 9.7* 9.7*  HCT 34.2* 31.5* 31.7*  WBC 16.9* 8.5 7.3  PLT 451* 408* 395     Resolved Hospital Problem list     Assessment & Plan:   Loculated parapneumonic pleural effusion/empyema: Nothing on Gram stain but drainage appears very thick and purulent.  Neutrophil predominant, exudative.  Chest x-ray improving with lytics.  WBC trending down.  CT scan improved overall.  Chest x-ray 07/13/2022 no significant change Chest tube drainage 07/13/2022 over the last 24 hours 490 cc  Leave chest tubes until  drainage is less than 100-a day Question reason the drainage is still so high questionable continued aspiration.    Best Practice (right click and "Reselect all SmartList Selections" daily)  Per primary  Critical care time: Ferol Luz Marli Diego ACNP Acute Care Nurse Practitioner [place] Please consult Amion 07/13/2022, 11:18 AM   07/13/2022, 11:18 AM

## 2022-07-13 NOTE — Progress Notes (Signed)
Mobility Specialist: Progress Note   07/13/22 1416  Mobility  Activity Refused mobility   Pt refused mobility, no reason specified. Will f/u as able.   [place] Roland Lipke Mobility Specialist Please contact via SecureChat or Rehab office at [phone]

## 2022-07-13 NOTE — Progress Notes (Signed)
Occupational Therapy Treatment Patient Details Name: [name] MRN: [id number] DOB: 09/20/1955 Today's Date: 07/13/2022   History of present illness 66 y.o. male who presented with worsening shortness of breath and cough on 07/05/22. Chest x-ray showed left-sided pneumonia with large effusion with possibility of empyema. Underwent pigtail catheter placement on 07/06/2022. Underwent EGD on 07/07/2022. Now undergoing Pleural Fibrinolysis. 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   OT comments  Pt in bed upon therapy arrival and agreeable to participate in OT treatment. Pt demonstrates improved activity tolerance and endurance during session. No reports of SOB or changes in vital signs. Provided patient with energy conservation handout. Education provided regarding use of energy conservation strategies that may be utilized at home. Due to pt's progress, discharge recommendation has been updated to Outpatient OT. Pt has met all OT goals from an acute standpoint and further OT needs can be met at next venue of care. Pt agreed with recommendation.   Recommendations for follow up therapy are one component of a multi-disciplinary discharge planning process, led by the attending physician.  Recommendations may be updated based on patient status, additional functional criteria and insurance authorization.    Follow Up Recommendations  Outpatient OT    Assistance Recommended at Discharge PRN  Patient can return home with the following  A little help with walking and/or transfers;Help with stairs or ramp for entrance;Assistance with cooking/housework;A little help with bathing/dressing/bathroom    Equipment Recommendations  None recommended by OT       Precautions / Restrictions Precautions Precautions: Fall Precaution Comments: chest tube Restrictions Weight Bearing Restrictions: No       Mobility Bed Mobility Overal bed mobility: Modified Independent     Patient Response: Cooperative  Transfers Overall transfer level: Needs assistance Equipment used: Rolling walker (2 wheels) Transfers: Sit to/from Stand, Bed to chair/wheelchair/BSC Sit to Stand: Supervision Stand pivot transfers: Supervision         General transfer comment: Only supervision required due to line management for safety.     Balance Overall balance assessment: No apparent balance deficits (not formally assessed)         ADL either performed or assessed with clinical judgement   ADL     General ADL Comments: Pt requires SBA/set-up for ADL tasks at this time due to line management (IV, telemetry, and chest tube). Otherwise, pt would be Mod I level.      Cognition Arousal/Alertness: Awake/alert Behavior During Therapy: WFL for tasks assessed/performed Overall Cognitive Status: Within Functional Limits for tasks assessed                   Pertinent Vitals/ Pain       Pain Assessment Pain Assessment: No/denies pain         Frequency  Min 2X/week        Progress Toward Goals  OT Goals(current goals can now be found in the care plan section)  Progress towards OT goals: Goals met/education completed, patient discharged from OT     Plan All goals met and education completed, patient discharged from OT services;Discharge plan needs to be updated       AM-PAC OT "6 Clicks" Daily Activity     Outcome Measure   Help from another person   eating meals?: None Help from another person taking care of personal grooming?: None Help from another person toileting, which includes using toliet, bedpan, or urinal?: None Help from another person bathing (including washing, rinsing,  drying)?: None Help from another person to put on and taking off regular upper body clothing?: None Help from another person to put on and taking off regular lower body clothing?: None 6 Click Score: 24    End of Session Equipment Utilized During Treatment: Rolling walker (2 wheels)  OT Visit Diagnosis: Unsteadiness on feet (R26.81);Other abnormalities of gait and mobility (R26.89);Muscle weakness (generalized) (M62.81)   Activity Tolerance Patient tolerated treatment well   Patient Left in chair;with call bell/phone within reach;with nursing/sitter in room           Time: 1148-1208 OT Time Calculation (min): 20 min  Charges: OT General Charges $OT Visit: 1 Visit OT Treatments $Therapeutic Activity: 8-22 mins  Laura Essenmacher, OTR/L,CBIS  Supplemental OT - [facility] and [facility]   Essenmacher, Laura D 07/13/2022, 12:35 PM 

## 2022-07-13 NOTE — Inpatient Diabetes Management (Addendum)
Inpatient Diabetes Program Recommendations  AACE/ADA: New Consensus Statement on Inpatient Glycemic Control (2015)  Target Ranges:  Prepandial:   less than 140 mg/dL      Peak postprandial:   less than 180 mg/dL (1-2 hours)      Critically ill patients:  140 - 180 mg/dL   Lab Results  Component Value Date   GLUCAP 274 (H) 07/13/2022   HGBA1C 9.7 (H) 07/06/2022    Review of Glycemic Control  Latest Reference Range & Units 07/13/22 07:37 07/13/22 11:42  Glucose-Capillary 70 - 99 mg/dL 161 (H) 274 (H)  (H): Data is abnormally high  Diabetes history: DM2 Outpatient Diabetes medications: Levemir 22 units BID, Prandin 1 mg BID, Trulicity 1.5 mg every Sunday.   Current orders for Inpatient glycemic control: Levemir 15 units QD, Novolog 0-15 units TID and 0-5 QHS, Novolog 3 units TID, Prandin 1 mg BID   Inpatient Diabetes Program Recommendations:     Novolog 5 units tid if consumes at least 50%.  Noted-Prandin 1 mg added today for 17:00.  Might consider increasing Novolog meal coverage rather than adding oral meal coverage.     Will continue to follow while inpatient.   Thank you, Reche Dixon, MSN, [place] [facility] Coordinator Inpatient Diabetes Program [phone] (team pager from 8a-5p)

## 2022-07-13 NOTE — Progress Notes (Signed)
PROGRESS NOTE   LIBERATO STANSBERY  GUY:[id number] DOB: [birth year] DOA: 07/05/2022 PCP: [name], MD  Brief Narrative:  66 year old white male Prior DVT PE 12/2014, insulin-dependent DM TY 2 History of squamous cell CA 2009 status post chemo XRT-previously had been seen by Dr. 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 Alen Blew Presented to emergency room with cough SOB 07/05/2022 which worsened Imaging = left-sided pneumonia with large effusion?  Empyema  11/2-pulmonology consulted, ENT and GI consulted Underwent chest tube placement 11/3-EGD salmon-colored mucosa suture found in stomach gastritis biopsied duodenitis normal duodenum,  11/4 through 11/6 fibrinolysis of pleural tube performed   Hospital-Problem based course  Acute respiratory failure with hypoxia secondary to aspiration pneumonia left-sided empyema and sepsis -Completed azithromycin-remains on ceftriaxone and Flagyl and would treat at least total antibiotics 21 days will transition to p.o. in a.m. -Has been given fibrinolytics several times and is still draining about 100 cc so defer to pulmonology when to remove chest tube - De-escalate oxygen as able, with desat screen last chest CT showed small loculated left hydropneumothorax with airspace opacity right middle lobe  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 -ENT saw patient SLP following-EGD 11/3 gastritis duodenitis Continues on soft diet and will need outpatient manometry testing  DM TY 2 with hyperglycemia and hypoglycemia complications D6L was 9.7 -CBGs ranging 120-274 - Continue Levemir 15, continue mealtime 3 units coverage and sliding scale - Resume Trulicity once weekly, Prandin 1 mg twice daily  Hypokalemia earlier in admission -Resolved  CKD 3B -Stable at this time-hold losartan ongoing for now can retest in the outpatient  setting  Hypothyroid - Continue Synthroid 75 daily  DVT prophylaxis: Lovenox Code Status: Full Family Communication: None Disposition:  Status is: Inpatient Remains inpatient appropriate because:   Needs to have chest tube removed and then likely can go home with home health versus outpatient PT   Consultants:  Pulmonary  Procedures: None  Antimicrobials: Ceftriaxone Flagyl   Subjective: Awake coherent no distress seems comfortable no pain not on oxygen no fever no chills although producing some sputum asking for meds  Objective: Vitals:   07/12/22 1510 07/12/22 2126 07/13/22 0510 07/13/22 0734  BP: 100/66 128/88 (!) 154/82 118/71  Pulse: (!) 102 (!) 105 (!) 103 100  Resp: 15 20 19    Temp: 97.8 F (36.6 C) 99.1 F (37.3 C) 98 F (36.7 C) 98 F (36.7 C)  TempSrc: Oral Oral Oral Oral  SpO2: 96% 98% 95% 94%  Weight:      Height:        Intake/Output Summary (Last 24 hours) at 07/13/2022 0813 Last data filed at 07/13/2022 0507 Gross per 24 hour  Intake 500 ml  Output 1530 ml  Net -1030 ml   Filed Weights   07/07/22 0500 07/08/22 0500 07/11/22 0500  Weight: 83 kg 83.1 kg 88.9 kg    Examination:  EOMI NCAT no focal deficit no icterus no pallor no rales no rhonchi no wheeze Chest is clear He has chest tube on the right side S1-S2?  Murmur Abdomen soft no tenderness no rebound no guarding No lower extremity edema Psych intact and euthymic  Data Reviewed: personally reviewed   CBC    Component Value Date/Time   WBC 7.3 07/13/2022 0419   RBC 3.50 (L) 07/13/2022 0419   HGB 9.7 (L) 07/13/2022 0419   HGB 12.0 (L) 04/05/2022 1304   HGB 15.8  11/18/2012 1331   HCT 31.7 (L) 07/13/2022 0419   HCT 47.5 11/18/2012 1331   PLT 395 07/13/2022 0419   PLT 200 04/05/2022 1304   PLT 201 11/18/2012 1331   MCV 90.6 07/13/2022 0419   MCV 88.6 11/18/2012 1331   MCH 27.7 07/13/2022 0419   MCHC 30.6 07/13/2022 0419   RDW 13.4 07/13/2022 0419   RDW 12.9 11/18/2012 1331    LYMPHSABS 0.7 07/13/2022 0419   LYMPHSABS 0.8 (L) 11/18/2012 1331   MONOABS 0.6 07/13/2022 0419   MONOABS 0.4 11/18/2012 1331   EOSABS 0.2 07/13/2022 0419   EOSABS 0.1 11/18/2012 1331   BASOSABS 0.0 07/13/2022 0419   BASOSABS 0.0 11/18/2012 1331      Latest Ref Rng & Units 07/13/2022    4:19 AM 07/12/2022    4:40 AM 07/11/2022    4:17 AM  CMP  Glucose 70 - 99 mg/dL 128  132  74   BUN 8 - 23 mg/dL 22  25  24    Creatinine 0.61 - 1.24 mg/dL 1.98  1.98  2.05   Sodium 135 - 145 mmol/L 134  135  138   Potassium 3.5 - 5.1 mmol/L 4.6  4.2  4.7   Chloride 98 - 111 mmol/L 101  103  105   CO2 22 - 32 mmol/L 24  24  25    Calcium 8.9 - 10.3 mg/dL 8.0  8.1  8.4      Radiology Studies: DG CHEST PORT 1 VIEW  Result Date: 07/13/2022 CLINICAL DATA:  Left pleural effusion EXAM: PORTABLE CHEST 1 VIEW COMPARISON:  Previous studies including the examination of 07/11/2022 FINDINGS: Transverse diameter of heart is increased. Small left pleural effusion is seen with slight interval decrease. Left hemidiaphragm is elevated. There is chest tube in the lateral aspect of left lower lung field. There is improvement in aeration of left upper lung field and right lower lung field. Linear densities in left lower lung fields have not changed significantly suggesting subsegmental atelectasis/pneumonia. There are no new infiltrates or signs of pulmonary edema. There is residual aspirated barium in the medial right lower lung field. Right lateral CP angle is clear. There is no pneumothorax. IMPRESSION: There is slight decrease in left pleural effusion. Linear densities in left lower lung fields suggest atelectasis/pneumonia. There is improved aeration in left upper lung field which may be due to decrease in pleural effusion. There is improvement in aeration of right lower lung field suggesting resolving atelectasis. There are no new focal infiltrates or signs of pulmonary edema. Electronically Signed   By: Elmer Picker M.D.   On: 07/13/2022 08:04   CT CHEST WO CONTRAST  Result Date: 07/11/2022 CLINICAL DATA:  Pleural effusion, malignancy suspected, personal history of right renal cell carcinoma and throat cancer * Tracking Code: BO * EXAM: CT CHEST WITHOUT CONTRAST TECHNIQUE: Multidetector CT imaging of the chest was performed following the standard protocol without IV contrast. RADIATION DOSE REDUCTION: This exam was performed according to the departmental dose-optimization program which includes automated exposure control, adjustment of the mA and/or kV according to patient size and/or use of iterative reconstruction technique. COMPARISON:  07/05/2022, 06/02/2022 FINDINGS: Cardiovascular: Aortic atherosclerosis. Cardiomegaly. Three-vessel coronary artery calcifications. New, moderate pericardial effusion. Enlargement of the main pulmonary artery measuring up to 4.2 cm in caliber. Mediastinum/Nodes: Unchanged enlarged mediastinal and left hilar lymph nodes, largest prevascular nodes measuring up to 1.4 x 1.0 cm (series 3, image 54). Thyroid gland, trachea, and esophagus demonstrate no significant findings. Lungs/Pleura:  Diminished volume of a now small, loculated left hydropneumothorax with left-sided pigtail chest tube positioned about the anterior left lung base (series 3, image 93). Extensive left-sided pleural thickening, particularly about the left lung base. New, heterogeneous airspace opacity and consolidation about the medial right lower lobe (series 4, image 100). Unchanged, dense centrilobular radiopaque material in the right lung base, presumably reflecting chronic aspirate of barium or other radiodense material (series 4, image 105). Upper Abdomen: No acute abnormality. Partially imaged postoperative findings of right nephrectomy in the upper abdomen. Musculoskeletal: No chest wall abnormality. No acute osseous findings. Disc degenerative disease and bridging osteophytosis throughout the thoracic  spine, in keeping with DISH. IMPRESSION: 1. Diminished volume of a now small, loculated left hydropneumothorax with left-sided pigtail chest tube positioned about the anterior left lung base. 2. Extensive left-sided pleural thickening, particularly about the left lung base. 3. New, heterogeneous airspace opacity and consolidation about the medial right lower lobe, consistent with infection or aspiration. 4. Unchanged enlarged mediastinal and left hilar lymph nodes, nonspecific. Nodal metastatic disease not excluded given personal history of malignancy. 5. New, moderate pericardial effusion. 6. Enlargement of the main pulmonary artery, as can be seen in pulmonary hypertension. 7. Coronary artery disease. Aortic Atherosclerosis (ICD10-I70.0). Electronically Signed   By: Delanna Ahmadi M.D.   On: 07/11/2022 10:58     Scheduled Meds:  enoxaparin (LOVENOX) injection  80 mg Subcutaneous Q12H   insulin aspart  0-15 Units Subcutaneous TID WC   insulin aspart  0-5 Units Subcutaneous QHS   insulin aspart  3 Units Subcutaneous TID WC   insulin detemir  15 Units Subcutaneous Daily   levothyroxine  75 mcg Oral q morning   melatonin  3 mg Oral QHS   mouth rinse  15 mL Mouth Rinse 4 times per day   pantoprazole  40 mg Oral BID   sodium chloride flush  10 mL Intrapleural Q8H   Continuous Infusions:  cefTRIAXone (ROCEPHIN)  IV 2 g (07/12/22 5701)   metronidazole 500 mg (07/13/22 0507)     LOS: 8 days   Time spent: Leonard, MD [facility] To contact the attending provider between 7A-7P or the covering provider during after hours 7P-7A, please log into the web site [email or web address] and access using universal [place] password for that web site. If you do not have the password, please call the hospital operator.  07/13/2022, 8:13 AM

## 2022-07-13 NOTE — Plan of Care (Signed)

## 2022-07-14 ENCOUNTER — Telehealth: Payer: Self-pay | Admitting: Internal Medicine

## 2022-07-14 ENCOUNTER — Other Ambulatory Visit (HOSPITAL_COMMUNITY): Payer: Self-pay

## 2022-07-14 ENCOUNTER — Inpatient Hospital Stay (HOSPITAL_COMMUNITY): Payer: 59

## 2022-07-14 DIAGNOSIS — J90 Pleural effusion, not elsewhere classified: Secondary | ICD-10-CM

## 2022-07-14 LAB — COMPREHENSIVE METABOLIC PANEL
ALT: 13 U/L (ref 0–44)
AST: 18 U/L (ref 15–41)
Albumin: <1.5 g/dL — ABNORMAL LOW (ref 3.5–5.0)
Alkaline Phosphatase: 63 U/L (ref 38–126)
Anion gap: 6 (ref 5–15)
BUN: 17 mg/dL (ref 8–23)
CO2: 25 mmol/L (ref 22–32)
Calcium: 8 mg/dL — ABNORMAL LOW (ref 8.9–10.3)
Chloride: 105 mmol/L (ref 98–111)
Creatinine, Ser: 1.71 mg/dL — ABNORMAL HIGH (ref 0.61–1.24)
GFR, Estimated: 43 mL/min — ABNORMAL LOW (ref >60)
Glucose, Bld: 117 mg/dL — ABNORMAL HIGH (ref 70–99)
Potassium: 4.4 mmol/L (ref 3.5–5.1)
Sodium: 136 mmol/L (ref 135–145)
Total Bilirubin: 0.6 mg/dL (ref 0.3–1.2)
Total Protein: 4.9 g/dL — ABNORMAL LOW (ref 6.5–8.1)

## 2022-07-14 LAB — CBC
HCT: 30.3 % — ABNORMAL LOW (ref 39.0–52.0)
Hemoglobin: 9.3 g/dL — ABNORMAL LOW (ref 13.0–17.0)
MCH: 27.3 pg (ref 26.0–34.0)
MCHC: 30.7 g/dL (ref 30.0–36.0)
MCV: 88.9 fL (ref 80.0–100.0)
Platelets: 420 10*3/uL — ABNORMAL HIGH (ref 150–400)
RBC: 3.41 MIL/uL — ABNORMAL LOW (ref 4.22–5.81)
RDW: 13.5 % (ref 11.5–15.5)
WBC: 6.1 10*3/uL (ref 4.0–10.5)
nRBC: 0 % (ref 0.0–0.2)

## 2022-07-14 LAB — GLUCOSE, CAPILLARY
Glucose-Capillary: 148 mg/dL — ABNORMAL HIGH (ref 70–99)
Glucose-Capillary: 206 mg/dL — ABNORMAL HIGH (ref 70–99)

## 2022-07-14 MED ORDER — AMOXICILLIN-POT CLAVULANATE 875-125 MG PO TABS
1.0000 | ORAL_TABLET | Freq: Two times a day (BID) | ORAL | 0 refills | Status: AC
  Filled 2022-07-14: qty 22, 11d supply, fill #0

## 2022-07-14 MED ORDER — AMOXICILLIN-POT CLAVULANATE 875-125 MG PO TABS
1.0000 | ORAL_TABLET | Freq: Two times a day (BID) | ORAL | Status: DC
  Administered 2022-07-14: 1 via ORAL
  Filled 2022-07-14: qty 1

## 2022-07-14 MED ORDER — PANTOPRAZOLE SODIUM 40 MG PO TBEC
40.0000 mg | DELAYED_RELEASE_TABLET | Freq: Two times a day (BID) | ORAL | 0 refills | Status: DC
  Filled 2022-07-14: qty 60, 30d supply, fill #0

## 2022-07-14 NOTE — Discharge Summary (Signed)
Physician Discharge Summary  [name] OEU:[id number] DOB: [birth year] DOA: 07/05/2022  PCP: [name], MD  Admit date: 07/05/2022 Discharge date: 07/14/2022  Time spent: 35 minutes  Recommendations for Outpatient Follow-up:  Needs Chem-7 CBC in about 1 week Consider resumption of losartan in the outpatient setting.  Start renal function/he has only 1 solitary kidney Recommend outpatient titration upwards of downwards of his insulins Get chest x-ray in about 5 to 6 weeks after treatment with Augmentin through 11/21  Discharge Diagnoses:  MAIN problem for hospitalization   Aspiration pneumonia likely secondary to chronic aspiration Empyema secondary to the above Squamous cell CA status post XRT followed by Dr. 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 Alen Blew Presented to emergency room with cough SOB 07/05/2022 which worsened Imaging = left-sided pneumonia with large effusion?  Empyema   11/2-pulmonology consulted, ENT and GI consulted Underwent chest tube placement 11/3-EGD salmon-colored mucosa suture found in stomach gastritis biopsied duodenitis normal duodenum,  11/4 through 11/6 fibrinolysis of pleural tube performed     Hospital-Problem based course   Acute respiratory failure with hypoxia secondary to  aspiration pneumonia left-sided empyema and sepsis -Completed azithromycin-remains on ceftriaxone and Flagyl and was transitioned on discharge to Augmentin to complete at least 21 days on 11/21 -Has been given fibrinolytics several times and chest tube was finally pulled on 11/10 -last chest CT showed small loculated left hydropneumothorax with airspace opacity right middle lobe - Pulmonology saw the patient and cleared him for discharge    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 -ENT saw patient SLP following-EGD 11/3 gastritis duodenitis -Continues on soft diet and will need outpatient manometry testing - Patient has been recommended to have outpatient speech therapy and have asked case management to arrange this in the outpatient setting   DM TY 2 with hyperglycemia and hypoglycemia complications X5Q was 9.7 -CBGs ranging  122-250  - Continue Levemir 15, continue mealtime 3 units coverage and sliding scale--can titrate upwards dose of insulin - Resume Trulicity once weekly, Prandin 1 mg twice daily   Hypokalemia earlier in admission -Resolved   CKD 3B Underlying prior nephrectomy -Stable at this time-hold losartan ongoing for now can retest in the outpatient setting   Hypothyroid - Continue Synthroid 75 daily  Prior DVT/PE -Resume anticoagulation on discharge with Xarelto  Discharge Exam: Vitals:   07/13/22 2011 07/14/22 0741  BP:  115/73  Pulse:  99  Resp:  (!) 23  Temp: 98.6 F (37 C) 98.1 F (36.7 C)  SpO2:  96%    Subj on day of d/c   Doing well looks fair no distress no specific chest pain some mild diarrhea  General Exam on discharge  EOMI NCAT no focal deficit icterus pallor Chest is clear with decreased air entry on the left lower lung fields Abdomen soft no rebound no  guarding S1-S2 no murmur Extremity edema ROM intact no focal deficit  Discharge Instructions   Discharge Instructions     Diet - low sodium heart healthy    Complete by: As directed    Discharge instructions   Complete by: As directed    Complete Augmentin in the outpatient setting after 3 weeks if you still have symptoms of cough cold make sure that you are seen by Dr. Harrington Challenger Please follow-up with Dr Harrington Challenger 7 to 10 days-would recommend getting an x-ray in 5 to 6 weeks once we feel the pneumonia is relatively controlled Would recommend continued holding of your losartan for now given risk of renal insufficiency and this can be resumed in the outpatient setting-would also recommend titration of your insulin dosing based on how much you eat and keep your sugars below 250 if possible   Increase activity slowly   Complete by: As directed    No wound care   Complete by: As directed       Allergies as of 07/14/2022   No Known Allergies      Medication List     STOP taking these medications    losartan 25 MG tablet Commonly known as: COZAAR       TAKE these medications    acetaminophen 500 MG tablet Commonly known as: TYLENOL Take 1,000 mg by mouth daily as needed for mild pain or moderate pain.   amoxicillin-clavulanate 875-125 MG tablet Commonly known as: AUGMENTIN Take 1 tablet by mouth every 12 (twelve) hours for 11 days.   ezetimibe-simvastatin 10-20 MG tablet Commonly known as: VYTORIN Take 1 tablet by mouth once daily at night.   freestyle lancets Use to check blood sugar twice a day.   FREESTYLE LITE test strip Generic drug: glucose blood Use to test your blood sugar twice a day.   Insulin Pen Needle 32G X 4 MM Misc Use as directed once daily   Levemir FlexPen 100 UNIT/ML FlexPen Generic drug: insulin detemir Inject 22 units into the skin 2 times a day What changed:  how much to take when to take this   levothyroxine 75 MCG tablet Commonly known as: SYNTHROID Take 1 tablet by mouth every morning on an empty stomach What changed: when to take this   methocarbamol 750 MG tablet Commonly known as:  ROBAXIN Take 1 tablet (750 mg total) by mouth every 4 (four) hours as needed for tight muscles.   multivitamin tablet Take 1 tablet by mouth daily.   pantoprazole 40 MG tablet Commonly known as: PROTONIX Take 1 tablet (40 mg total) by mouth 2 (two) times daily.   repaglinide 1 MG tablet Commonly known as: PRANDIN Take 1/2 to 1 tablet by mouth 15 to 30 minutes before meals twice a day What changed:  how much to take how to take this when to take this   Trulicity 1.5 SE/8.3TD Sopn Generic drug: Dulaglutide Inject 1 pen under the skin once a week What changed:  how much to take how to take this when to take this   Xarelto 10 MG Tabs tablet Generic drug: rivaroxaban TAKE 1 TABLET BY MOUTH ONCE DAILY WITH FOOD. What changed: when to take this       No Known Allergies  Follow-up Information     Hunsucker, Bonna Gains, MD Follow up in 1 week(s).   Specialty: Pulmonary Disease Why: We will call and set up appt for you for repeat CXR. Contact information: [address] [place]  [place] [address] [phone]                  The results of significant diagnostics from this hospitalization (including imaging, microbiology, ancillary and laboratory) are listed below for reference.    Significant Diagnostic Studies: DG CHEST PORT 1 VIEW  Result Date: 07/14/2022 CLINICAL DATA:  Chest tube EXAM: PORTABLE CHEST 1 VIEW COMPARISON:  Chest x-ray dated July 14, 2022 FINDINGS: Visualized cardiac and mediastinal contours are unchanged. Interval removal of left-sided chest tube. Interval removal of left-sided chest tube with small left hydropneumothorax, unchanged trace basilar air component. Unchanged calcifications of the right lower lung at the area of the costophrenic angle, likely due to chronic barium aspiration. Unchanged left basilar opacities which are likely due to atelectasis. No large pleural effusion. IMPRESSION: Interval removal of left-sided  chest tube with small left hydropneumothorax, unchanged trace basilar air component. Electronically Signed   By: Yetta Glassman M.D.   On: 07/14/2022 12:13   DG Chest Port 1 View  Result Date: 07/14/2022 CLINICAL DATA:  Shortness of breath.  Evaluate pleural effusion. EXAM: PORTABLE CHEST 1 VIEW COMPARISON:  07/13/2022 FINDINGS: Stable position of left sided pigtail thoracostomy tube. There is unchanged asymmetric elevation of the left hemidiaphragm. Unchanged volume of the loculated left-sided hydropneumothorax with increased gas component. Overlying atelectasis is again noted in appears unchanged. Aspirated barium is again noted within the right lung base. The right lung is otherwise clear. IMPRESSION: 1. Stable position of left pigtail thoracostomy tube. 2. Unchanged volume of loculated left-sided hydropneumothorax with increased gas component. Electronically Signed   By: Kerby Moors M.D.   On: 07/14/2022 06:54   DG CHEST PORT 1 VIEW  Result Date: 07/13/2022 CLINICAL DATA:  Left pleural effusion EXAM: PORTABLE CHEST 1 VIEW COMPARISON:  Previous studies including the examination of 07/11/2022 FINDINGS: Transverse diameter of heart is increased. Small left pleural effusion is seen with slight interval decrease. Left hemidiaphragm is elevated. There is chest tube in the lateral aspect of left lower lung field. There is improvement in aeration of left upper lung field and right lower lung field. Linear densities in left lower lung fields have not changed significantly suggesting subsegmental atelectasis/pneumonia. There are no new infiltrates or signs of pulmonary edema. There is residual aspirated barium in the medial right lower lung field. Right lateral CP angle is clear. There is no pneumothorax. IMPRESSION: There is slight decrease in left pleural effusion. Linear densities in left lower lung fields suggest atelectasis/pneumonia. There is improved aeration in left upper lung field which may be due  to decrease in pleural effusion. There is improvement in aeration of right lower lung field suggesting resolving atelectasis. There are no new focal infiltrates or signs of pulmonary edema. Electronically Signed   By: Elmer Picker M.D.   On: 07/13/2022 08:04   CT CHEST WO CONTRAST  Result Date: 07/11/2022 CLINICAL DATA:  Pleural effusion, malignancy suspected, personal history of right renal cell carcinoma and throat cancer * Tracking Code: BO * EXAM: CT CHEST WITHOUT CONTRAST TECHNIQUE: Multidetector CT imaging of the chest was performed following the standard protocol without IV contrast. RADIATION DOSE REDUCTION: This exam was performed according to the departmental dose-optimization program which includes automated exposure control, adjustment of the mA and/or kV according to patient size and/or use of iterative reconstruction technique. COMPARISON:  07/05/2022, 06/02/2022 FINDINGS: Cardiovascular: Aortic atherosclerosis. Cardiomegaly. Three-vessel coronary artery calcifications. New, moderate pericardial effusion. Enlargement of the main pulmonary artery measuring up to 4.2 cm in caliber.  Mediastinum/Nodes: Unchanged enlarged mediastinal and left hilar lymph nodes, largest prevascular nodes measuring up to 1.4 x 1.0 cm (series 3, image 54). Thyroid gland, trachea, and esophagus demonstrate no significant findings. Lungs/Pleura: Diminished volume of a now small, loculated left hydropneumothorax with left-sided pigtail chest tube positioned about the anterior left lung base (series 3, image 93). Extensive left-sided pleural thickening, particularly about the left lung base. New, heterogeneous airspace opacity and consolidation about the medial right lower lobe (series 4, image 100). Unchanged, dense centrilobular radiopaque material in the right lung base, presumably reflecting chronic aspirate of barium or other radiodense material (series 4, image 105). Upper Abdomen: No acute abnormality. Partially  imaged postoperative findings of right nephrectomy in the upper abdomen. Musculoskeletal: No chest wall abnormality. No acute osseous findings. Disc degenerative disease and bridging osteophytosis throughout the thoracic spine, in keeping with DISH. IMPRESSION: 1. Diminished volume of a now small, loculated left hydropneumothorax with left-sided pigtail chest tube positioned about the anterior left lung base. 2. Extensive left-sided pleural thickening, particularly about the left lung base. 3. New, heterogeneous airspace opacity and consolidation about the medial right lower lobe, consistent with infection or aspiration. 4. Unchanged enlarged mediastinal and left hilar lymph nodes, nonspecific. Nodal metastatic disease not excluded given personal history of malignancy. 5. New, moderate pericardial effusion. 6. Enlargement of the main pulmonary artery, as can be seen in pulmonary hypertension. 7. Coronary artery disease. Aortic Atherosclerosis (ICD10-I70.0). Electronically Signed   By: Delanna Ahmadi M.D.   On: 07/11/2022 10:58   DG CHEST PORT 1 VIEW  Result Date: 07/11/2022 CLINICAL DATA:  Chest tube,loculated pleural [phone] Loculated pleural effusion [id number] EXAM: PORTABLE CHEST 1 VIEW COMPARISON:  07/10/2022 FINDINGS: Stable enlarged cardiac silhouette. LEFT chest tube in place. Persistent LEFT effusion slightly decreased in volume compared to prior. High-density material in the medial RIGHT lower lobe again noted. IMPRESSION: 1. No pneumothorax. 2. Slight increase in volume of LEFT pleural effusion with LEFT basilar chest tube in place. Electronically Signed   By: Suzy Bouchard M.D.   On: 07/11/2022 09:10   DG Chest 1 View  Result Date: 07/10/2022 CLINICAL DATA:  Chest tube in place EXAM: CHEST  1 VIEW COMPARISON:  07/09/2022 FINDINGS: Cardiomegaly. Left-sided pigtail chest tube remains in position about the left lung base, with significantly improved aeration of the left lung and diminished  volume of a left pleural effusion. No pneumothorax. Persistent elevation of the left hemidiaphragm. Very dense material again noted at the medial right lung base, this appearance generally suggesting barium aspirate. Osseous structures unremarkable. IMPRESSION: 1. Left-sided pigtail chest tube remains in position about the left lung base, with significantly improved aeration of the left lung and diminished volume of a left pleural effusion. No pneumothorax. 2. Very dense material again noted at the medial right lung base, this appearance generally suggesting barium aspirate. 3. Cardiomegaly. Electronically Signed   By: Delanna Ahmadi M.D.   On: 07/10/2022 11:36   DG CHEST PORT 1 VIEW  Result Date: 07/09/2022 CLINICAL DATA:  Follow-up pleural effusion EXAM: PORTABLE CHEST 1 VIEW COMPARISON:  07/08/2022 FINDINGS: Cardiac shadow is stable but somewhat obscured by the consolidation within the left lung. Minimal improved aeration in the left lung is noted. Pigtail catheter is again seen on the left. No pneumothorax is noted. Right lung is well aerated with basilar opacities consistent with previous aspiration. No acute bony abnormality is noted. IMPRESSION: Slight improved aeration in the left lung when compared with the previous day. Electronically Signed  By: Inez Catalina M.D.   On: 07/09/2022 09:29   DG CHEST PORT 1 VIEW  Result Date: 07/08/2022 CLINICAL DATA:  Pleural effusion. EXAM: PORTABLE CHEST 1 VIEW COMPARISON:  07/06/2022 chest radiograph and prior studies FINDINGS: Increasing opacification of the LEFT hemithorax is noted. LEFT thoracostomy tube is again identified. High density material within the MEDIAL RIGHT lung base again noted. The remainder of the visualized RIGHT lung is clear. There is no evidence of pneumothorax. No acute bony abnormalities are noted. IMPRESSION: Increasing opacification of the LEFT hemithorax which may represent increasing pleural effusion, airspace disease/consolidation  and/or atelectasis. Correlate with patency of thoracostomy tube. Electronically Signed   By: Margarette Canada M.D.   On: 07/08/2022 16:13   DG CHEST PORT 1 VIEW  Result Date: 07/06/2022 CLINICAL DATA:  Pleural effusion, chest tube placement EXAM: PORTABLE CHEST 1 VIEW COMPARISON:  Previous studies including the examination of 07/05/2022 FINDINGS: There is interval placement of left chest tube There is no significant change in large left pleural effusion and infiltrate in left lung. There is interval increase in infiltrate in left apical region. No infiltrates are seen in right lung. There is no evidence of apical pneumothorax. IMPRESSION: There is interval placement of left chest tube. There is large loculated left pleural effusion with no significant change. There is possible increase in infiltrate in the left apical region suggesting worsening of pneumonia. Electronically Signed   By: Elmer Picker M.D.   On: 07/06/2022 16:13   CT Angio Chest PE W and/or Wo Contrast  Result Date: 07/05/2022 CLINICAL DATA:  Pulmonary embolism (PE) suspected, high prob. Cough, fever and chest congestion. Seen by [employer]. Suggested eval for pneumonia. Had "pulled chest muscle" around 06/16/22, hx oc throat CA 2009, rt kidney CA 2021 with chemo/radiation. Pt not a smoker. EXAM: CT ANGIOGRAPHY CHEST WITH CONTRAST TECHNIQUE: Multidetector CT imaging of the chest was performed using the standard protocol during bolus administration of intravenous contrast. Multiplanar CT image reconstructions and MIPs were obtained to evaluate the vascular anatomy. RADIATION DOSE REDUCTION: This exam was performed according to the departmental dose-optimization program which includes automated exposure control, adjustment of the mA and/or kV according to patient size and/or use of iterative reconstruction technique. CONTRAST:  86mL OMNIPAQUE IOHEXOL 350 MG/ML SOLN COMPARISON:  CT chest 06/02/2022 FINDINGS: Cardiovascular: Satisfactory  opacification of the pulmonary arteries to the segmental level. No evidence of pulmonary embolism. Normal heart size. No significant pericardial effusion. The thoracic aorta is normal in caliber. No atherosclerotic plaque of the thoracic aorta. Three-vessel coronary artery calcifications. Mediastinum/Nodes: Mediastinal lymphadenopathy: As an example a subcarinal 1.7 cm lymph node (5:121) and a pre-vascular 1 cm lymph node (5:99). Borderline enlarged bilateral hilar lymph nodes: 1 cm (5:109) and 1 cm (5:123). No enlarged axillary lymph nodes. Thyroid gland, trachea, and esophagus demonstrate no significant findings. Lungs/Pleura: Interval worsening of diffuse bronchial wall thickening within bilateral lower lobe. Similar-appearing right lower lobe metallic densities that may represent previously aspirated material. No focal consolidation. Interval development of right lower lobe and to lesser extent right middle lobe tree-in-bud nodularity. No pulmonary mass. Interval development of an at least moderate volume left pleural effusion with associated several foci of pleural gas. No pneumothorax. Upper Abdomen: No acute abnormality. Musculoskeletal: No chest wall abnormality. No suspicious lytic or blastic osseous lesions. No acute displaced fracture. Multilevel degenerative changes of the spine. Review of the MIP images confirms the above findings. IMPRESSION: 1. No pulmonary embolus. 2. Interval worsening of diffuse bronchial wall  thickening with right lower lobe and right middle lobe development of atypical infection. 3. Interval development of an at least moderate volume left hydropneumothorax. 4. Mediastinal and hilar lymphadenopathy. Recommend attention on follow-up. Electronically Signed   By: Iven Finn M.D.   On: 07/05/2022 19:29   DG Chest 2 View  Result Date: 07/05/2022 CLINICAL DATA:  Cough, chest pain, shortness of breath EXAM: CHEST - 2 VIEW COMPARISON:  Previous studies including the examination  of 06/18/2022 FINDINGS: There is interval opacification of left mid and left lower lung fields suggesting pneumonia and possibly pleural effusion. Part of the effusion appears to be loculated along the lateral chest wall. Possibility of empyema is not excluded. There are numerous small metallic densities in the medial right lower lung field, possibly suggesting previous aspiration of barium. This finding has not changed. IMPRESSION: There is interval appearance of large area of opacification in the left mid and left lower lung fields. Findings suggest pneumonia and large effusion. Part of the effusion appears to be loculated along the lateral chest wall. Possibility of empyema is not excluded. Follow-up CT may be considered. Electronically Signed   By: Elmer Picker M.D.   On: 07/05/2022 17:41    Microbiology: Recent Results (from the past 240 hour(s))  Blood Culture (routine x 2)     Status: None   Collection Time: 07/05/22  6:08 PM   Specimen: BLOOD  Result Value Ref Range Status   Specimen Description   Final    BLOOD LEFT ANTECUBITAL Performed at [facility], [address], [place], [place] [address]    Special Requests   Final    Blood Culture adequate volume BOTTLES DRAWN AEROBIC AND ANAEROBIC Performed at [facility], [address] [place], [place] [address]    Culture   Final    NO GROWTH 5 DAYS Performed at [facility], [place] [address], [place], [place] [address]    Report Status 07/10/2022 FINAL  Final  Blood Culture (routine x 2)     Status: None   Collection Time: 07/05/22  6:08 PM   Specimen: BLOOD  Result Value Ref Range Status   Specimen Description   Final    BLOOD BLOOD RIGHT FOREARM Performed at [facility], [address], [place], [place] [address]    Special Requests   Final    Blood Culture adequate volume BOTTLES DRAWN AEROBIC AND ANAEROBIC Performed at [facility],  [address] [place], [place] [address]    Culture   Final    NO GROWTH 5 DAYS Performed at [facility], [place] [address], [place], [place] [address]    Report Status 07/10/2022 FINAL  Final  Body fluid culture w Gram Stain     Status: None   Collection Time: 07/06/22  4:02 PM   Specimen: Pleura; Body Fluid  Result Value Ref Range Status   Specimen Description PLEURAL  Final   Special Requests LEFT  Final   Gram Stain NO WBC SEEN NO ORGANISMS SEEN   Final   Culture   Final    NO GROWTH 3 DAYS Performed at [facility], [address] [place], [place] [address]    Report Status 07/11/2022 FINAL  Final     Labs: Basic Metabolic Panel: Recent Labs  Lab 07/09/22 0337 07/10/22 0428 07/11/22 0417 07/12/22 0440 07/13/22 0419 07/14/22 0407  NA 136 137 138 135 134* 136  K 4.6 4.5 4.7 4.2 4.6 4.4  CL 106 103 105 103 101 105  CO2 24 22 25 24 24 25   GLUCOSE 160* 144* 74 132* 128* 117*  BUN 19 22 24* 25* 22 17  CREATININE 1.65* 1.87* 2.05* 1.98* 1.98* 1.71*  CALCIUM 8.4* 8.4* 8.4* 8.1* 8.0* 8.0*  MG 1.9 1.8 1.8 1.7 1.9  --    Liver Function Tests: Recent Labs  Lab 07/14/22 0407  AST 18  ALT 13  ALKPHOS 63  BILITOT 0.6  PROT 4.9*  ALBUMIN <1.5*   No results for input(s): "LIPASE", "AMYLASE" in the last 168 hours. No results for input(s): "AMMONIA" in the last 168 hours. CBC: Recent Labs  Lab 07/09/22 0337 07/10/22 0428 07/11/22 0417 07/12/22 0440 07/13/22 0419 07/14/22 0407  WBC 18.0* 15.7* 16.9* 8.5 7.3 6.1  NEUTROABS 15.8* 13.5* 14.5* 6.8 5.7  --   HGB 10.5* 10.8* 10.9* 9.7* 9.7* 9.3*  HCT 34.1* 34.7* 34.2* 31.5* 31.7* 30.3*  MCV 90.9 89.4 89.1 89.7 90.6 88.9  PLT 464* 495* 451* 408* 395 420*   Cardiac Enzymes: No results for input(s): "CKTOTAL", "CKMB", "CKMBINDEX", "TROPONINI" in the last 168 hours. BNP: BNP (last 3 results) No results for input(s): "BNP" in the last 8760 hours.  ProBNP (last 3 results) No results for input(s):  "PROBNP" in the last 8760 hours.  CBG: Recent Labs  Lab 07/13/22 1142 07/13/22 1628 07/13/22 2012 07/14/22 0744 07/14/22 1221  GLUCAP 274* 151* 106* 148* 206*       Signed:  [name] MD   [facility] 07/14/2022, 1:38 PM

## 2022-07-14 NOTE — Telephone Encounter (Signed)
Pt returned call and has been scheduled a f/u appt. Nothing further needed.

## 2022-07-14 NOTE — Progress Notes (Signed)
Removed CT per orders, patient tolerated well

## 2022-07-14 NOTE — Progress Notes (Signed)
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  Renal cell carcinoma with right nephrectomy 2021 per Dr. Alinda Money.  Ongoing evaluation for dysphagia with barium swallow indicative of chronic aspiration and narrowing of of the esophagus.  Suspect there is a component of aspiration.  Pulmonary critical care asked to evaluate for left effusion and suspected left empyema.  Agree with interim empirical antimicrobial therapy.  Pertinent  Medical History   Past Medical History:  Diagnosis Date   Alopecia    s/p chemotherapy   Arthritis    Bursitis    pt. denies   Cancer ([place]) 10/2007    tongue/squamous cell,stg IV,HPV positive   Diabetes mellitus without complication ([place])    takes Amaryl,Januvia,and Metformin,daily   DVT, lower extremity ([place]) 2009   side effect from chemo and has blood clot left leg-wears compression stockings;was on Coumadin for 8months and has been off 5 yrs   History of chemotherapy    Cisplatin/Taxotere,5-FU   History of radiation therapy 01/28/08-03/11/08   left base tongue   History of shingles    Hyperlipidemia    takes Vytorin daily   Hypothyroidism    takes Synthroid daily   Joint pain    Joint swelling    Neuropathy    left foot   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 denies   Right kidney mass    Seborrheic keratosis    multiple on back     Significant Hospital Events: Including procedures, antibiotic start and stop dates in addition to other pertinent events   07/06/2022 left  chest tube 07/07/2022 EGD negative findings 07/10/2022 completion of 3 thrombolytic injections via chest tube Limits on 7/23 chest x-ray stable, CT scan shows improved loculated effusion with now small effusion is more appropriate lead layering due to gravity with hydropneumothorax 07/14/2022 DC chest tube  Interim History / Subjective:  130 cc  from chest tube in 24 hours Objective   Blood pressure 115/73, pulse 99, temperature 98.1 F (36.7 C), temperature source Oral, resp. rate (!) 23, height 6\' 3"  (1.905 m), weight 88.9 kg, SpO2 96 %.        Intake/Output Summary (Last 24 hours) at 07/14/2022 0841 Last data filed at 07/14/2022 0617 Gross per 24 hour  Intake 370 ml  Output 1500 ml  Net -1130 ml   Filed Weights   07/07/22 0500 07/08/22 0500 07/11/22 0500  Weight: 83 kg 83.1 kg 88.9 kg    Examination: General: Thin male no acute distress eating breakfast HEENT: MM pink/moist no JVD Neuro: Grossly intact CV: Heart sounds are regular PULM: Congested cough diminished in the bases GI: soft, bsx4 active  GU: Voids Extremities: warm/dry, negative edema  Skin: no rashes or lesions    Recent Labs  Lab 07/12/22 0440 07/13/22 0419 07/14/22 0407  NA 135 134* 136  K 4.2 4.6 4.4  CL 103 101 105  CO2 24 24 25   BUN 25* 22 17  CREATININE 1.98* 1.98* 1.71*  GLUCOSE 132* 128* 117*   Recent Labs  Lab 07/12/22 0440 07/13/22 0419  07/14/22 0407  HGB 9.7* 9.7* 9.3*  HCT 31.5* 31.7* 30.3*  WBC 8.5 7.3 6.1  PLT 408* 395 420*     Resolved Hospital Problem list     Assessment & Plan:   Loculated parapneumonic pleural effusion/empyema: Nothing on Gram stain but drainage appears very thick and purulent.  Neutrophil predominant, exudative.  Chest x-ray improving with lytics.  WBC trending down.  CT scan improved overall.  Chest x-ray 07/14/2022 improved 130 cc of chest tube drainage in 24 hours DC chest tube today 07/14/2022 Follow-up chest x-ray     Best Practice  (right click and "Reselect all SmartList Selections" daily)  Per primary  Critical care time: Ferol Luz Artesha Wemhoff ACNP Acute Care Nurse Practitioner [place] Please consult [place] 07/14/2022, 8:41 AM   07/14/2022, 8:41 AM

## 2022-07-14 NOTE — Progress Notes (Signed)
Mobility Specialist Progress Note   07/14/22 1105  Mobility  Activity Ambulated with assistance in hallway  Level of Assistance Contact guard assist, steadying assist  Assistive Device Front wheel walker  Distance Ambulated (ft) 550 ft  Activity Response Tolerated well  $Mobility charge 1 Mobility   Pre Mobility:109 HR, 132/81 BP, 96% SpO2 During Mobility: 124 HR, 92% SpO2 Post Mobility: 112 HR, 166/88 BP, 95% SpO2  Received pt in bed having no complaints and eager for mobility. Requesting to use BR before session, successful BM and void. Despite x1 standing break d/t coughing spell, SpO2 remained >92%. Returned back to bed w/o fault or complaints, left w/ call bell in reach and needs met.  Holland Falling Mobility Specialist Acute Rehab Office:  [phone]

## 2022-07-17 ENCOUNTER — Ambulatory Visit: Payer: 59 | Admitting: Internal Medicine

## 2022-07-23 NOTE — Progress Notes (Unsigned)
@Patient  ID: [name], male    DOB: [birth year], 66 y.o.   MRN: [id number]  No chief complaint on file.   Referring provider: [name], MD  HPI: 66 year old male, never smoked. PMH significant for CAP, acute respiratory failure, hx DVT/PE, malignant neoplasm base tongue, pleural effusion, dysphagia, type 2 diabetes, renal cell carcinoma.   07/24/2022 Patient presents today for hospital follow-up. He was admitted on 07/05/22 for CAP.   Needs CXR today    No Known Allergies  Immunization History  Administered Date(s) Administered   Moderna Covid-19 Vaccine Bivalent Booster 38yrs & up 06/09/2021   Moderna SARS-COV2 Booster Vaccination 12/30/2020   PFIZER(Purple Top)SARS-COV-2 Vaccination 06/22/2020    Past Medical History:  Diagnosis Date   Alopecia    s/p chemotherapy   Arthritis    Bursitis    pt. denies   Cancer ([place]) 10/2007    tongue/squamous cell,stg IV,HPV positive   Diabetes mellitus without complication ([place])    takes Amaryl,Januvia,and Metformin,daily   DVT, lower extremity ([place]) 2009   side effect from chemo and has blood clot left leg-wears compression stockings;was on Coumadin for 38months and has been off 5 yrs   History of chemotherapy    Cisplatin/Taxotere,5-FU   History of radiation therapy 01/28/08-03/11/08   left base tongue   History of shingles    Hyperlipidemia    takes Vytorin daily   Hypothyroidism    takes Synthroid daily   Joint pain    Joint swelling    Neuropathy    left foot   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 denies   Right kidney mass    Seborrheic keratosis    multiple on back    Tobacco History: Social History   Tobacco Use  Smoking Status Never  Smokeless Tobacco Never   Counseling given: Not Answered   Outpatient Medications Prior to Visit  Medication Sig Dispense Refill   acetaminophen (TYLENOL) 500 MG tablet Take 1,000  mg by mouth daily as needed for mild pain or moderate pain.     amoxicillin-clavulanate (AUGMENTIN) 875-125 MG tablet Take 1 tablet by mouth every 12 (twelve) hours for 11 days. 22 tablet 0   Dulaglutide (TRULICITY) 1.5 TK/3.5WS SOPN Inject 1 pen under the skin once a week (Patient taking differently: Inject 1.5 mg into the skin every Sunday.) 6 mL 4   ezetimibe-simvastatin (VYTORIN) 10-20 MG tablet Take 1 tablet by mouth once daily at night. 90 tablet 4   glucose blood (FREESTYLE LITE) test strip Use to test your blood sugar twice a day. 100 each 0   insulin detemir (LEVEMIR) 100 UNIT/ML FlexPen Inject 22 units into the skin 2 times a day (Patient taking differently: Inject 22 Units into the skin 2 (two) times daily.) 15 mL 4   Insulin Pen Needle 32G X 4 MM MISC Use as directed once daily 100 each 3   Lancets (FREESTYLE) lancets Use to check blood sugar twice a day. 100 each 4   levothyroxine (SYNTHROID) 75 MCG tablet Take 1 tablet by mouth every morning on an empty stomach (Patient taking differently: Take 75 mcg by mouth daily before breakfast.) 90 tablet 3   methocarbamol (ROBAXIN) 750 MG tablet Take 1 tablet (750 mg total) by mouth every 4 (four) hours as needed for tight muscles. (Patient not taking: Reported on 07/06/2022) 90 tablet 1   Multiple Vitamin (MULTIVITAMIN) tablet  Take 1 tablet by mouth daily.     pantoprazole (PROTONIX) 40 MG tablet Take 1 tablet (40 mg total) by mouth 2 (two) times daily. 60 tablet 0   repaglinide (PRANDIN) 1 MG tablet Take 1/2 to 1 tablet by mouth 15 to 30 minutes before meals twice a day (Patient taking differently: Take 1 mg by mouth 2 (two) times daily before a meal.) 90 tablet 6   rivaroxaban (XARELTO) 10 MG TABS tablet TAKE 1 TABLET BY MOUTH ONCE DAILY WITH FOOD. (Patient taking differently: Take 10 mg by mouth in the morning.) 90 tablet 4   Facility-Administered Medications Prior to Visit  Medication Dose Route Frequency Provider Last Rate Last Admin    magnesium citrate solution 0.5 Bottle  0.5 Bottle Oral Once Raynelle Bring, MD          Review of Systems  Review of Systems   Physical Exam  There were no vitals taken for this visit. Physical Exam   Lab Results:  CBC    Component Value Date/Time   WBC 6.1 07/14/2022 0407   RBC 3.41 (L) 07/14/2022 0407   HGB 9.3 (L) 07/14/2022 0407   HGB 12.0 (L) 04/05/2022 1304   HGB 15.8 11/18/2012 1331   HCT 30.3 (L) 07/14/2022 0407   HCT 47.5 11/18/2012 1331   PLT 420 (H) 07/14/2022 0407   PLT 200 04/05/2022 1304   PLT 201 11/18/2012 1331   MCV 88.9 07/14/2022 0407   MCV 88.6 11/18/2012 1331   MCH 27.3 07/14/2022 0407   MCHC 30.7 07/14/2022 0407   RDW 13.5 07/14/2022 0407   RDW 12.9 11/18/2012 1331   LYMPHSABS 0.7 07/13/2022 0419   LYMPHSABS 0.8 (L) 11/18/2012 1331   MONOABS 0.6 07/13/2022 0419   MONOABS 0.4 11/18/2012 1331   EOSABS 0.2 07/13/2022 0419   EOSABS 0.1 11/18/2012 1331   BASOSABS 0.0 07/13/2022 0419   BASOSABS 0.0 11/18/2012 1331    BMET    Component Value Date/Time   NA 136 07/14/2022 0407   NA 136 11/18/2012 1331   K 4.4 07/14/2022 0407   K 4.7 11/18/2012 1331   CL 105 07/14/2022 0407   CL 98 11/18/2012 1331   CO2 25 07/14/2022 0407   CO2 27 11/18/2012 1331   GLUCOSE 117 (H) 07/14/2022 0407   GLUCOSE 612 Repeated and Verified (HH) 11/18/2012 1331   BUN 17 07/14/2022 0407   BUN 24.6 11/18/2012 1331   CREATININE 1.71 (H) 07/14/2022 0407   CREATININE 2.33 (H) 04/05/2022 1304   CREATININE 1.7 (H) 11/18/2012 1331   CALCIUM 8.0 (L) 07/14/2022 0407   CALCIUM 9.9 11/18/2012 1331   GFRNONAA 43 (L) 07/14/2022 0407   GFRNONAA 30 (L) 04/05/2022 1304   GFRAA 58 (L) 06/05/2020 2336    BNP No results found for: "BNP"  ProBNP No results found for: "PROBNP"  Imaging: DG CHEST PORT 1 VIEW  Result Date: 07/14/2022 CLINICAL DATA:  Chest tube EXAM: PORTABLE CHEST 1 VIEW COMPARISON:  Chest x-ray dated July 14, 2022 FINDINGS: Visualized cardiac and  mediastinal contours are unchanged. Interval removal of left-sided chest tube. Interval removal of left-sided chest tube with small left hydropneumothorax, unchanged trace basilar air component. Unchanged calcifications of the right lower lung at the area of the costophrenic angle, likely due to chronic barium aspiration. Unchanged left basilar opacities which are likely due to atelectasis. No large pleural effusion. IMPRESSION: Interval removal of left-sided chest tube with small left hydropneumothorax, unchanged trace basilar air component. Electronically Signed   By:  Yetta Glassman M.D.   On: 07/14/2022 12:13   DG Chest Port 1 View  Result Date: 07/14/2022 CLINICAL DATA:  Shortness of breath.  Evaluate pleural effusion. EXAM: PORTABLE CHEST 1 VIEW COMPARISON:  07/13/2022 FINDINGS: Stable position of left sided pigtail thoracostomy tube. There is unchanged asymmetric elevation of the left hemidiaphragm. Unchanged volume of the loculated left-sided hydropneumothorax with increased gas component. Overlying atelectasis is again noted in appears unchanged. Aspirated barium is again noted within the right lung base. The right lung is otherwise clear. IMPRESSION: 1. Stable position of left pigtail thoracostomy tube. 2. Unchanged volume of loculated left-sided hydropneumothorax with increased gas component. Electronically Signed   By: Kerby Moors M.D.   On: 07/14/2022 06:54   DG CHEST PORT 1 VIEW  Result Date: 07/13/2022 CLINICAL DATA:  Left pleural effusion EXAM: PORTABLE CHEST 1 VIEW COMPARISON:  Previous studies including the examination of 07/11/2022 FINDINGS: Transverse diameter of heart is increased. Small left pleural effusion is seen with slight interval decrease. Left hemidiaphragm is elevated. There is chest tube in the lateral aspect of left lower lung field. There is improvement in aeration of left upper lung field and right lower lung field. Linear densities in left lower lung fields have not  changed significantly suggesting subsegmental atelectasis/pneumonia. There are no new infiltrates or signs of pulmonary edema. There is residual aspirated barium in the medial right lower lung field. Right lateral CP angle is clear. There is no pneumothorax. IMPRESSION: There is slight decrease in left pleural effusion. Linear densities in left lower lung fields suggest atelectasis/pneumonia. There is improved aeration in left upper lung field which may be due to decrease in pleural effusion. There is improvement in aeration of right lower lung field suggesting resolving atelectasis. There are no new focal infiltrates or signs of pulmonary edema. Electronically Signed   By: Elmer Picker M.D.   On: 07/13/2022 08:04   CT CHEST WO CONTRAST  Result Date: 07/11/2022 CLINICAL DATA:  Pleural effusion, malignancy suspected, personal history of right renal cell carcinoma and throat cancer * Tracking Code: BO * EXAM: CT CHEST WITHOUT CONTRAST TECHNIQUE: Multidetector CT imaging of the chest was performed following the standard protocol without IV contrast. RADIATION DOSE REDUCTION: This exam was performed according to the departmental dose-optimization program which includes automated exposure control, adjustment of the mA and/or kV according to patient size and/or use of iterative reconstruction technique. COMPARISON:  07/05/2022, 06/02/2022 FINDINGS: Cardiovascular: Aortic atherosclerosis. Cardiomegaly. Three-vessel coronary artery calcifications. New, moderate pericardial effusion. Enlargement of the main pulmonary artery measuring up to 4.2 cm in caliber. Mediastinum/Nodes: Unchanged enlarged mediastinal and left hilar lymph nodes, largest prevascular nodes measuring up to 1.4 x 1.0 cm (series 3, image 54). Thyroid gland, trachea, and esophagus demonstrate no significant findings. Lungs/Pleura: Diminished volume of a now small, loculated left hydropneumothorax with left-sided pigtail chest tube positioned about  the anterior left lung base (series 3, image 93). Extensive left-sided pleural thickening, particularly about the left lung base. New, heterogeneous airspace opacity and consolidation about the medial right lower lobe (series 4, image 100). Unchanged, dense centrilobular radiopaque material in the right lung base, presumably reflecting chronic aspirate of barium or other radiodense material (series 4, image 105). Upper Abdomen: No acute abnormality. Partially imaged postoperative findings of right nephrectomy in the upper abdomen. Musculoskeletal: No chest wall abnormality. No acute osseous findings. Disc degenerative disease and bridging osteophytosis throughout the thoracic spine, in keeping with DISH. IMPRESSION: 1. Diminished volume of a now small, loculated  left hydropneumothorax with left-sided pigtail chest tube positioned about the anterior left lung base. 2. Extensive left-sided pleural thickening, particularly about the left lung base. 3. New, heterogeneous airspace opacity and consolidation about the medial right lower lobe, consistent with infection or aspiration. 4. Unchanged enlarged mediastinal and left hilar lymph nodes, nonspecific. Nodal metastatic disease not excluded given personal history of malignancy. 5. New, moderate pericardial effusion. 6. Enlargement of the main pulmonary artery, as can be seen in pulmonary hypertension. 7. Coronary artery disease. Aortic Atherosclerosis (ICD10-I70.0). Electronically Signed   By: Delanna Ahmadi M.D.   On: 07/11/2022 10:58   DG CHEST PORT 1 VIEW  Result Date: 07/11/2022 CLINICAL DATA:  Chest tube,loculated pleural [phone] Loculated pleural effusion [id number] EXAM: PORTABLE CHEST 1 VIEW COMPARISON:  07/10/2022 FINDINGS: Stable enlarged cardiac silhouette. LEFT chest tube in place. Persistent LEFT effusion slightly decreased in volume compared to prior. High-density material in the medial RIGHT lower lobe again noted. IMPRESSION: 1. No pneumothorax. 2.  Slight increase in volume of LEFT pleural effusion with LEFT basilar chest tube in place. Electronically Signed   By: Suzy Bouchard M.D.   On: 07/11/2022 09:10   DG Chest 1 View  Result Date: 07/10/2022 CLINICAL DATA:  Chest tube in place EXAM: CHEST  1 VIEW COMPARISON:  07/09/2022 FINDINGS: Cardiomegaly. Left-sided pigtail chest tube remains in position about the left lung base, with significantly improved aeration of the left lung and diminished volume of a left pleural effusion. No pneumothorax. Persistent elevation of the left hemidiaphragm. Very dense material again noted at the medial right lung base, this appearance generally suggesting barium aspirate. Osseous structures unremarkable. IMPRESSION: 1. Left-sided pigtail chest tube remains in position about the left lung base, with significantly improved aeration of the left lung and diminished volume of a left pleural effusion. No pneumothorax. 2. Very dense material again noted at the medial right lung base, this appearance generally suggesting barium aspirate. 3. Cardiomegaly. Electronically Signed   By: Delanna Ahmadi M.D.   On: 07/10/2022 11:36   DG CHEST PORT 1 VIEW  Result Date: 07/09/2022 CLINICAL DATA:  Follow-up pleural effusion EXAM: PORTABLE CHEST 1 VIEW COMPARISON:  07/08/2022 FINDINGS: Cardiac shadow is stable but somewhat obscured by the consolidation within the left lung. Minimal improved aeration in the left lung is noted. Pigtail catheter is again seen on the left. No pneumothorax is noted. Right lung is well aerated with basilar opacities consistent with previous aspiration. No acute bony abnormality is noted. IMPRESSION: Slight improved aeration in the left lung when compared with the previous day. Electronically Signed   By: Inez Catalina M.D.   On: 07/09/2022 09:29   DG CHEST PORT 1 VIEW  Result Date: 07/08/2022 CLINICAL DATA:  Pleural effusion. EXAM: PORTABLE CHEST 1 VIEW COMPARISON:  07/06/2022 chest radiograph and prior  studies FINDINGS: Increasing opacification of the LEFT hemithorax is noted. LEFT thoracostomy tube is again identified. High density material within the MEDIAL RIGHT lung base again noted. The remainder of the visualized RIGHT lung is clear. There is no evidence of pneumothorax. No acute bony abnormalities are noted. IMPRESSION: Increasing opacification of the LEFT hemithorax which may represent increasing pleural effusion, airspace disease/consolidation and/or atelectasis. Correlate with patency of thoracostomy tube. Electronically Signed   By: Margarette Canada M.D.   On: 07/08/2022 16:13   DG CHEST PORT 1 VIEW  Result Date: 07/06/2022 CLINICAL DATA:  Pleural effusion, chest tube placement EXAM: PORTABLE CHEST 1 VIEW COMPARISON:  Previous studies including the examination of  07/05/2022 FINDINGS: There is interval placement of left chest tube There is no significant change in large left pleural effusion and infiltrate in left lung. There is interval increase in infiltrate in left apical region. No infiltrates are seen in right lung. There is no evidence of apical pneumothorax. IMPRESSION: There is interval placement of left chest tube. There is large loculated left pleural effusion with no significant change. There is possible increase in infiltrate in the left apical region suggesting worsening of pneumonia. Electronically Signed   By: Elmer Picker M.D.   On: 07/06/2022 16:13   CT Angio Chest PE W and/or Wo Contrast  Result Date: 07/05/2022 CLINICAL DATA:  Pulmonary embolism (PE) suspected, high prob. Cough, fever and chest congestion. Seen by [employer]. Suggested eval for pneumonia. Had "pulled chest muscle" around 06/16/22, hx oc throat CA 2009, rt kidney CA 2021 with chemo/radiation. Pt not a smoker. EXAM: CT ANGIOGRAPHY CHEST WITH CONTRAST TECHNIQUE: Multidetector CT imaging of the chest was performed using the standard protocol during bolus administration of intravenous contrast. Multiplanar CT  image reconstructions and MIPs were obtained to evaluate the vascular anatomy. RADIATION DOSE REDUCTION: This exam was performed according to the departmental dose-optimization program which includes automated exposure control, adjustment of the mA and/or kV according to patient size and/or use of iterative reconstruction technique. CONTRAST:  16mL OMNIPAQUE IOHEXOL 350 MG/ML SOLN COMPARISON:  CT chest 06/02/2022 FINDINGS: Cardiovascular: Satisfactory opacification of the pulmonary arteries to the segmental level. No evidence of pulmonary embolism. Normal heart size. No significant pericardial effusion. The thoracic aorta is normal in caliber. No atherosclerotic plaque of the thoracic aorta. Three-vessel coronary artery calcifications. Mediastinum/Nodes: Mediastinal lymphadenopathy: As an example a subcarinal 1.7 cm lymph node (5:121) and a pre-vascular 1 cm lymph node (5:99). Borderline enlarged bilateral hilar lymph nodes: 1 cm (5:109) and 1 cm (5:123). No enlarged axillary lymph nodes. Thyroid gland, trachea, and esophagus demonstrate no significant findings. Lungs/Pleura: Interval worsening of diffuse bronchial wall thickening within bilateral lower lobe. Similar-appearing right lower lobe metallic densities that may represent previously aspirated material. No focal consolidation. Interval development of right lower lobe and to lesser extent right middle lobe tree-in-bud nodularity. No pulmonary mass. Interval development of an at least moderate volume left pleural effusion with associated several foci of pleural gas. No pneumothorax. Upper Abdomen: No acute abnormality. Musculoskeletal: No chest wall abnormality. No suspicious lytic or blastic osseous lesions. No acute displaced fracture. Multilevel degenerative changes of the spine. Review of the MIP images confirms the above findings. IMPRESSION: 1. No pulmonary embolus. 2. Interval worsening of diffuse bronchial wall thickening with right lower lobe and  right middle lobe development of atypical infection. 3. Interval development of an at least moderate volume left hydropneumothorax. 4. Mediastinal and hilar lymphadenopathy. Recommend attention on follow-up. Electronically Signed   By: Iven Finn M.D.   On: 07/05/2022 19:29   DG Chest 2 View  Result Date: 07/05/2022 CLINICAL DATA:  Cough, chest pain, shortness of breath EXAM: CHEST - 2 VIEW COMPARISON:  Previous studies including the examination of 06/18/2022 FINDINGS: There is interval opacification of left mid and left lower lung fields suggesting pneumonia and possibly pleural effusion. Part of the effusion appears to be loculated along the lateral chest wall. Possibility of empyema is not excluded. There are numerous small metallic densities in the medial right lower lung field, possibly suggesting previous aspiration of barium. This finding has not changed. IMPRESSION: There is interval appearance of large area of opacification in the left  mid and left lower lung fields. Findings suggest pneumonia and large effusion. Part of the effusion appears to be loculated along the lateral chest wall. Possibility of empyema is not excluded. Follow-up CT may be considered. Electronically Signed   By: Elmer Picker M.D.   On: 07/05/2022 17:41     Assessment & Plan:   No problem-specific Assessment & Plan notes found for this encounter.     Martyn Ehrich, NP 07/23/2022

## 2022-07-24 ENCOUNTER — Other Ambulatory Visit: Payer: Self-pay | Admitting: *Deleted

## 2022-07-24 ENCOUNTER — Ambulatory Visit (INDEPENDENT_AMBULATORY_CARE_PROVIDER_SITE_OTHER): Payer: 59 | Admitting: Primary Care

## 2022-07-24 ENCOUNTER — Encounter: Payer: Self-pay | Admitting: Primary Care

## 2022-07-24 ENCOUNTER — Ambulatory Visit (INDEPENDENT_AMBULATORY_CARE_PROVIDER_SITE_OTHER): Payer: 59

## 2022-07-24 VITALS — BP 144/82 | HR 122 | Temp 97.8°F | Ht 74.0 in | Wt 186.6 lb

## 2022-07-24 DIAGNOSIS — J690 Pneumonitis due to inhalation of food and vomit: Secondary | ICD-10-CM | POA: Diagnosis not present

## 2022-07-24 DIAGNOSIS — J9601 Acute respiratory failure with hypoxia: Secondary | ICD-10-CM

## 2022-07-24 DIAGNOSIS — J939 Pneumothorax, unspecified: Secondary | ICD-10-CM | POA: Diagnosis not present

## 2022-07-24 DIAGNOSIS — J90 Pleural effusion, not elsewhere classified: Secondary | ICD-10-CM | POA: Diagnosis not present

## 2022-07-24 DIAGNOSIS — J189 Pneumonia, unspecified organism: Secondary | ICD-10-CM

## 2022-07-24 NOTE — Patient Instructions (Addendum)
We removed one suture left chest wall removed  CXR showed similar appearance to slightly increased left pleural effusion, we will continue to monitor- if you develop  shortness of breath,  fever, chest pain or O2 levels are low please present to ED/ call office   Recommendations: - Finished antibiotics as prescribed  - Take mucinex -dm twice a day - Practice deep breathing exercise, if you have incentive spirometer please use every hour  - Continue to follow aspiration precautions  Orders: Labs today CXR in 2 weeks    Follow-up:  Please schedule patient a follow-up visit on December 5th or 6th with Dr. Silas Flood (come in 30 mins early for CXR prior to visit)     Aspiration Precautions, Adult Aspiration is when a person breathes in (inhales) a liquid or other material, and it goes into the lungs. Adults who have conditions that affect their brain or spinal cord or who have trouble swallowing, a decreased gag reflex, or trouble moving around (mobility) are at risk for this condition. Things that can be inhaled into the lungs include: Food. Any type of liquid. This includes drinks and saliva. Stomach contents. This includes vomit and stomach acid. This condition can cause an infection in the lungs (pneumonia). Certain steps can be taken to reduce the risk of aspiration. What are the signs of aspiration? Signs may include: Coughing. A person may: Cough after they swallow food or liquids. Cough up mucus (sputum) that is yellow, tan, or green. It may also smell bad or have pieces of food in it. Cough when lying down or have to sit up quickly after lying down. Have a hoarse, barky cough. Trouble breathing. This may include: Breathing very quickly or very slowly. Loud breathing. High-pitched whistling sounds during breathing, most often when breathing out (wheezing). Trouble eating. This may involve: Clearing the throat often while eating. Drooling while eating. Having a feeling of  fullness in the throat or like something is stuck in the throat. Having a runny nose and watery eyes while eating. Speaking problems. This may include a hoarse voice or inability to speak. Choking often while eating. Changes in skin color. The skin may look red or blue. Pain in the chest or back during or right after eating. What problems can aspiration cause? This condition can cause problems such as: Losing weight because the person is not absorbing the nutrients they need. Loss of enjoyment and the social benefits of eating. Choking. Lung irritation. This can happen if someone aspirates acidic food or drinks. Pneumonia. Lung abscess. This is a collection of infected liquid (pus) in the lungs. In serious cases, death can occur. What can I do to prevent aspiration? Caring for someone who has a feeding tube If you are caring for someone who has a feeding tube and cannot eat or drink safely by mouth: Keep the person in an upright position as much as possible. Do not lay the person flat if they get feedings around the clock (continuous feedings). If you need to lay the person flat for any reason, turn the feeding pump off. Check for left over liquid (residuals) in the stomach as told by the health care provider. Ask the health care provider what residual amount is too high. Caring for someone who can eat and drink safely by mouth If you are caring for someone who can eat and drink safely by mouth: Have the person sit in an upright position when they eat or drink. This can be done by: Having  the person sit up in a chair. Positioning the person in bed so they are upright. This can be done if sitting in a chair is not possible. Remind the person to eat slowly and chew well. Make sure the person is awake and alert while eating. Never put food or liquids in the mouth of a person who is not fully alert. Do not distract the person, especially if they have problems with thinking or memory (cognitive  problems). Allow foods to cool. Hot foods may be harder to swallow. Provide small meals often rather than three large meals. This may help the person to not feel tired when eating. After the person is done eating: Check their mouth well for leftover food. Keep them sitting upright for 30-45 minutes. Do not serve food or drink within 2 hours of bedtime. General instructions To prevent aspiration in someone who can eat and drink safely by mouth: Feed small bites of food. Do not force-feed. If the person is on a diet because of problems with swallowing (dysphagia diet), follow the food and drink consistency that the health care provider recommends. You may be told to thicken a liquid using a thickening product. Use as little water as possible when brushing the person's teeth or cleaning their mouth. Provide oral care before and after meals. Use adaptive devices as told by the health care provider. These may include cut-out cups or other utensils. Crush pills and put them in soft food, such as pudding or ice cream. Some pills should not be crushed. Check with the health care provider before crushing any medicine. If the person is choking on food or an object, do abdominal thrusts.  Contact a health care provider if: The person has a feeding tube, and the feeding tube residual amount is too high. The person tries to avoid food or water. They may refuse to eat, drink, or be fed, or they may eat less than normal. The person may have aspirated food or liquid. The person shows warning signs of aspiration. These include choking or coughing when they eat or drink. The person has symptoms of pneumonia. These may include: Coughing a lot or coughing up mucus with a bad smell or blood in it. A long-lasting (chronic) cough. Shortness of breath or wheezing. Chest or back pain. Sweating, fever, or chills. Get help right away if: The person has trouble breathing or starts to breathe fast. The person is  breathing very slowly or stops breathing. The person cannot stop choking. The person turns blue, faints, or seems confused. These symptoms may be an emergency. Get help right away. Call 911. Do not wait to see if the symptoms will go away. This information is not intended to replace advice given to you by your health care provider. Make sure you discuss any questions you have with your health care provider. Document Revised: 02/01/2022 Document Reviewed: 02/01/2022 Elsevier Patient Education  [place].

## 2022-07-25 ENCOUNTER — Other Ambulatory Visit (HOSPITAL_COMMUNITY): Payer: Self-pay

## 2022-07-25 DIAGNOSIS — Z6824 Body mass index (BMI) 24.0-24.9, adult: Secondary | ICD-10-CM | POA: Diagnosis not present

## 2022-07-25 DIAGNOSIS — E1169 Type 2 diabetes mellitus with other specified complication: Secondary | ICD-10-CM | POA: Diagnosis not present

## 2022-07-25 DIAGNOSIS — Z09 Encounter for follow-up examination after completed treatment for conditions other than malignant neoplasm: Secondary | ICD-10-CM | POA: Diagnosis not present

## 2022-07-25 DIAGNOSIS — J690 Pneumonitis due to inhalation of food and vomit: Secondary | ICD-10-CM | POA: Diagnosis not present

## 2022-07-25 DIAGNOSIS — T17998A Other foreign object in respiratory tract, part unspecified causing other injury, initial encounter: Secondary | ICD-10-CM | POA: Diagnosis not present

## 2022-07-25 DIAGNOSIS — E162 Hypoglycemia, unspecified: Secondary | ICD-10-CM | POA: Diagnosis not present

## 2022-07-25 LAB — CBC WITH DIFFERENTIAL/PLATELET
Basophils Absolute: 0.1 10*3/uL (ref 0.0–0.1)
Basophils Relative: 0.7 % (ref 0.0–3.0)
Eosinophils Absolute: 0.1 10*3/uL (ref 0.0–0.7)
Eosinophils Relative: 1.5 % (ref 0.0–5.0)
HCT: 34.9 % — ABNORMAL LOW (ref 39.0–52.0)
Hemoglobin: 11.2 g/dL — ABNORMAL LOW (ref 13.0–17.0)
Lymphocytes Relative: 11.9 % — ABNORMAL LOW (ref 12.0–46.0)
Lymphs Abs: 1 10*3/uL (ref 0.7–4.0)
MCHC: 32 g/dL (ref 30.0–36.0)
MCV: 85.6 fl (ref 78.0–100.0)
Monocytes Absolute: 0.8 10*3/uL (ref 0.1–1.0)
Monocytes Relative: 9.1 % (ref 3.0–12.0)
Neutro Abs: 6.4 10*3/uL (ref 1.4–7.7)
Neutrophils Relative %: 76.8 % (ref 43.0–77.0)
Platelets: 541 10*3/uL — ABNORMAL HIGH (ref 150.0–400.0)
RBC: 4.07 Mil/uL — ABNORMAL LOW (ref 4.22–5.81)
RDW: 14.5 % (ref 11.5–15.5)
WBC: 8.4 10*3/uL (ref 4.0–10.5)

## 2022-07-25 LAB — BASIC METABOLIC PANEL
BUN: 13 mg/dL (ref 6–23)
CO2: 30 mEq/L (ref 19–32)
Calcium: 8.9 mg/dL (ref 8.4–10.5)
Chloride: 98 mEq/L (ref 96–112)
Creatinine, Ser: 1.62 mg/dL — ABNORMAL HIGH (ref 0.40–1.50)
GFR: 43.24 mL/min — ABNORMAL LOW (ref >60.00)
Glucose, Bld: 154 mg/dL — ABNORMAL HIGH (ref 70–99)
Potassium: 4.9 mEq/L (ref 3.5–5.1)
Sodium: 137 mEq/L (ref 135–145)

## 2022-07-25 MED ORDER — PANTOPRAZOLE SODIUM 40 MG PO TBEC
40.0000 mg | DELAYED_RELEASE_TABLET | Freq: Two times a day (BID) | ORAL | 1 refills | Status: DC
  Filled 2022-07-25 – 2022-08-13 (×2): qty 60, 30d supply, fill #0

## 2022-07-26 NOTE — Assessment & Plan Note (Signed)
-   Resolved; O2 95% RA. No oxygen requirements.

## 2022-07-26 NOTE — Assessment & Plan Note (Addendum)
-    Patient had chest tube from 07/06/22 through 07/14/22, given fibrinolytic's of pleural tube several times. Removed one suture from previous chest tube site on his left side. No signs of infection. Repeat CXR today showed similar to slightly increased loculated left hydropneumothorax. Since he is minimally symptomatic recommend follow-up repeat imaging in 2 weeks. If no better may need to refer to [facility].

## 2022-07-26 NOTE — Assessment & Plan Note (Addendum)
-  Admitted for aspiration pneumonia, loculated parapneumonic pleural effusion/empyema from 07/05/22-07/14/22. Overall he is feeling better. Cough has improved. No pleuretic pain or fevers. He was tachycardic but not requiring oxygen. He remains on Augmentin through 07/25/22. Checking CBC with diff and BMET today to follow WBC and kidney function.

## 2022-08-01 NOTE — Therapy (Signed)
OUTPATIENT PHYSICAL THERAPY NEURO EVALUATION   Patient Name: [name] MRN: [id number] DOB:02-08-1956, 66 y.o., male Today's Date: 08/02/2022   PCP: [name], MD  REFERRING PROVIDER: [name], MD  END OF SESSION:  PT End of Session - 08/02/22 0905     Visit Number 1    Number of Visits 13    Date for PT Re-Evaluation 09/13/22    Authorization Type [facility]    Authorization - Visit Number 1    Authorization - Number of Visits 25    PT Start Time 8588    PT Stop Time 0923    PT Time Calculation (min) 37 min    Equipment Utilized During Treatment Gait belt    Activity Tolerance Patient tolerated treatment well;Patient limited by fatigue    Behavior During Therapy WFL for tasks assessed/performed             Past Medical History:  Diagnosis Date   Alopecia    s/p chemotherapy   Arthritis    Bursitis    pt. 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,stg IV,HPV positive   Diabetes mellitus without complication ([place])    takes Amaryl,Januvia,and Metformin,daily   DVT, lower extremity ([place]) 2009   side effect from chemo and has blood clot left leg-wears compression stockings;was on Coumadin for 76months and has been off 5 yrs   History of chemotherapy    Cisplatin/Taxotere,5-FU   History of radiation therapy 01/28/08-03/11/08   left base tongue   History of shingles    Hyperlipidemia    takes Vytorin daily   Hypothyroidism    takes Synthroid daily   Joint pain    Joint swelling    Neuropathy    left foot   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 denies   Right kidney mass    Seborrheic keratosis    multiple on back   Past Surgical History:  Procedure Laterality Date   BIOPSY  07/07/2022   Procedure: BIOPSY;  Surgeon: Loney Laurence, DO;  Location: [facility];  Service: Gastroenterology;;   CARDIAC CATHETERIZATION     CHEST TUBE INSERTION   07/06/2022   Procedure: CHEST TUBE INSERTION;  Surgeon: Collene Gobble, MD;  Location: [facility];  Service: Cardiopulmonary;;   ESOPHAGOGASTRODUODENOSCOPY (EGD) WITH PROPOFOL N/A 07/07/2022   Procedure: ESOPHAGOGASTRODUODENOSCOPY (EGD) WITH PROPOFOL;  Surgeon: Loney Laurence, DO;  Location: [facility];  Service: Gastroenterology;  Laterality: N/A;   feeding tube placed  2009 and then removed   [place] Right 08/05/2020   Procedure: LYMPH NODE DISSECTION;  Surgeon: Raynelle Bring, MD;  Location: [facility];  Service: Urology;  Laterality: Right;   MOUTH SURGERY     PILONIDAL CYST / SINUS EXCISION  1974   port a cath placement     PORT-A-CATH REMOVAL  2010   right wrist fracture Right 2004   ROBOT ASSISTED LAPAROSCOPIC NEPHRECTOMY Right 08/05/2020   Procedure: XI ROBOTIC ASSISTED LAPAROSCOPIC RADICAL NEPHRECTOMY/PARTIAL ADRENALECTOMY;  Surgeon: Raynelle Bring, MD;  Location: [facility];  Service: Urology;  Laterality: Right;   SHOULDER ARTHROSCOPY WITH DISTAL CLAVICLE RESECTION Right 10/26/2016   Procedure: SHOULDER ARTHROSCOPY WITH DISTAL CLAVICLE RESECTION;  Surgeon: Justice Britain, MD;  Location: [facility];  Service: Orthopedics;  Laterality: Right;   SHOULDER ARTHROSCOPY WITH ROTATOR CUFF REPAIR AND SUBACROMIAL DECOMPRESSION Left 07/16/2014  Procedure: LEFT SHOULDER ARTHROSCOPY WITH ROTATOR CUFF REPAIR AND SUBACROMIAL DECOMPRESSION/DISTAL CLAVICLE RESECTION;  Surgeon: Marin Shutter, MD;  Location: [facility];  Service: Orthopedics;  Laterality: Left;   SHOULDER ARTHROSCOPY WITH ROTATOR CUFF REPAIR AND SUBACROMIAL DECOMPRESSION Right 10/26/2016   Procedure: RIGHT SHOULDER ARTHROSCOPY WITH ROTATOR CUFF REPAIR AND SUBACROMIAL DECOMPRESSION;  Surgeon: Justice Britain, MD;  Location: [facility];  Service: Orthopedics;  Laterality: Right;  requests 2hrs   teeth extraction #9     Patient Active Problem List   Diagnosis Date Noted   Acute respiratory failure with hypoxia ([place])  07/11/2022   Leukocytosis 07/11/2022   Dysphagia 07/11/2022   Chronic kidney disease (CKD), stage III (moderate) ([facility]) 07/11/2022   Anemia of chronic disease 07/11/2022   Thrombocytosis 07/11/2022   Pleural effusion 07/06/2022   Aspiration pneumonia ([place]) 07/05/2022   AKI (acute kidney injury) ([place]) 08/04/2021   History of DVT (deep vein thrombosis) 08/04/2021   History of pulmonary embolus (PE) 67/08/4579   Acute metabolic acidosis 99/83/3825   DKA (diabetic ketoacidosis) ([place]) 08/03/2021   Elevated serum creatinine 04/06/2021   Renal cell carcinoma, right ([place]) 08/05/2020   Thyroid activity decreased    Acute pulmonary embolism ([place]) 12/07/2014   Diabetes mellitus type 2 in nonobese ([place]) 12/07/2014   Cancer ([place])    History of angioplasty    Depression    DVT, lower extremity ([place])    Osteoradionecrosis of mandible    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/20/2011   Pulmonary embolism ([place]) 07/20/2011    ONSET DATE: 07/05/22  REFERRING DIAG: J18.9 (ICD-10-CM) - Community acquired pneumonia of left lung, unspecified part of lung  THERAPY DIAG:  Muscle weakness (generalized)  Difficulty in walking, not elsewhere classified  Rationale for Evaluation and Treatment: Rehabilitation  SUBJECTIVE:                                                                                                                                                                                             SUBJECTIVE STATEMENT: "Getting my energy back" since being in the hospital. Was D/c'd home and did not have HHPT. Was on supplemental O2 during hospitalization, but not at home. Reports that the main thing he has problems with is eating but also has limited endurance. Notes that walking to the mailbox is challenging, whereas he used to walk 3 miles a day as recently as October. Reports baseline dizziness upon  standing up quickly- suspects this is from being on blood thinners.    Pt accompanied by: self  PERTINENT HISTORY: DM, HLD,  neuropathy, R RTC repair, stage 4 tongue/squamous cell CA, R renal CA  PAIN:  Are you having pain? No  PRECAUTIONS: Other: per patient, aspiration precautions- no bread, no crackers, thin liquids okay  WEIGHT BEARING RESTRICTIONS: No  FALLS: Has patient fallen in last 6 months? 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 Trace thickening of the right inferolateral pleura may represent trace pleural effusion. Similar asymmetrically low left lung volumes with left basilar patchy opacities, likely atelectasis.  COGNITION: Overall cognitive status: Within functional limits for tasks assessed   SENSATION: Reports a little N/T in B feet from neuropathy  COORDINATION: Alternating pronation/supination: WNL B Alternating toe tap: WNL B Finger to nose: WNL B    POSTURE: rounded shoulders, forward head, and increased thoracic kyphosis  LOWER EXTREMITY ROM:     Active  Right Eval Left Eval  Hip flexion    Hip extension    Hip abduction    Hip adduction    Hip internal rotation    Hip external rotation    Knee flexion    Knee extension    Ankle dorsiflexion 5 5  Ankle plantarflexion    Ankle inversion    Ankle eversion     (Blank rows = not tested)  LOWER EXTREMITY MMT:    MMT (in sitting) Right Eval Left Eval  Hip flexion 4 4-  Hip extension    Hip abduction 4+ 4+  Hip adduction 4- 4  Hip internal rotation    Hip  external rotation    Knee flexion 5 4+  Knee extension 5 5  Ankle dorsiflexion 4+ 4+  Ankle plantarflexion 4+ 4+  Ankle inversion    Ankle eversion    (Blank rows = not tested)   GAIT: Gait pattern: Good gait speed and step length but with L lateral trunk lean and thoracic kyphosis  Assistive device utilized: None Level of assistance: Complete Independence  FUNCTIONAL TESTS:   [facility] PT Assessment - 08/02/22 0001       Standardized Balance Assessment   Standardized Balance Assessment Timed Up and Go Test;Five Times Sit to Stand;Dynamic Gait Index    Five times sit to stand comments  13.45 sec with B armrests   pt's pulse ox showed 90% spO2 and 128 bpm after test; after rest break: 97% spO2 and 113 bpm     Dynamic Gait Index   Level Surface Normal    Change in Gait Speed Normal    Gait with Horizontal Head Turns Normal    Gait with Vertical Head Turns Normal    Gait and Pivot Turn Mild Impairment    Step Over Obstacle Mild Impairment    Step Around Obstacles Mild Impairment    Steps Normal    Total Score 21    DGI comment: required 1 sitting rest break d/t fatigue and perceived elevated HR                TODAY'S TREATMENT:  DATE: 08/02/22    PATIENT EDUCATION: Education details: prognosis, POC, HEP, edu on exam findings  Person educated: Patient Education method: Explanation, Demonstration, Tactile cues, Verbal cues, and Handouts Education comprehension: verbalized understanding  HOME EXERCISE PROGRAM: Access Code: [email or web address] URL: [email or web address] Date: 08/02/2022 Prepared by: [facility]  Exercises - Sit to Stand with Counter Support  - 1 x daily - 5 x weekly - 3 sets - 10 reps - Gastroc Stretch on Wall  - 1 x daily - 5 x weekly - 2 sets - 30 hold - Standing Hip Abduction with  Counter Support  - 1 x daily - 5 x weekly - 2 sets - 10 reps - Standing Hip Extension with Counter Support  - 1 x daily - 5 x weekly - 2 sets - 10 reps   GOALS: Goals reviewed with patient? Yes  SHORT TERM GOALS: Target date: 08/23/2022  Patient to be independent with initial HEP. Baseline: HEP initiated Goal status: INITIAL    LONG TERM GOALS: Target date: 09/13/2022  Patient to be independent with advanced HEP. Baseline: Not yet initiated  Goal status: INITIAL  Patient to demonstrate B LE strength >/=4+/5.  Baseline: See above Goal status: INITIAL  Patient to demonstrate 10 degrees B ankle dorsiflexion AROM WFL in order to improve efficiency with gait. Baseline: 5 deg B Goal status: INITIAL  Patient to report tolerance for walking for 45 minutes without fatigue limiting.  Baseline: unable Goal status: INITIAL  Patient to demonstrate 5xSTS test without UE support in <15 sec in order to decrease risk of falls.  Baseline: 13 sec with armrests  Goal status: INITIAL  Patient to walk at least 1312 feet during 6 minute walk test in order to score closer to age-matched norms.  Baseline: NT Goal status: INITIAL   ASSESSMENT:  CLINICAL IMPRESSION:  Patient is a 66 y/o M presenting to OPPT with c/o decreased endurance s/p hospitalization 11/01-11/10/23 for aspiration pneumonia. Patient was D/C'd home without supplemental O2. Reports fatige when walking to the mailbox however was walking 3 miles a day at baseline. Patient today presenting with rounded and forward head posture, limited ankle mobility, decreased B hip strength, gait deviations, increased fatigue and drop in O2 saturation with activity. Patient was educated on gentle stretching and strengthening HEP and reported understanding. Would benefit from skilled PT services 1-2 x/week for 6 weeks to address aforementioned impairments in order to optimize level of function.     OBJECTIVE IMPAIRMENTS: Abnormal gait,  decreased activity tolerance, decreased endurance, decreased ROM, decreased strength, dizziness, impaired flexibility, improper body mechanics, and postural dysfunction.   ACTIVITY LIMITATIONS: carrying, lifting, bending, standing, squatting, stairs, bathing, and dressing  PARTICIPATION LIMITATIONS: meal prep, cleaning, laundry, shopping, community activity, yard work, and church  PERSONAL FACTORS: Age, Fitness, Past/current experiences, Time since onset of injury/illness/exacerbation, and 3+ comorbidities: DM, HLD, neuropathy, R RTC repair, stage 4 tongue/squamous cell CA, R renal CA  are also affecting patient's functional outcome.   REHAB POTENTIAL: Good  CLINICAL DECISION MAKING: Evolving/moderate complexity  EVALUATION COMPLEXITY: Moderate  PLAN:  PT FREQUENCY: 1-2x/week  PT DURATION: 6 weeks  PLANNED INTERVENTIONS: Therapeutic exercises, Therapeutic activity, Neuromuscular re-education, Balance training, Gait training, Patient/Family education, Self Care, Joint mobilization, Stair training, Vestibular training, Canalith repositioning, DME instructions, Aquatic Therapy, Dry Needling, Electrical stimulation, Cryotherapy, Moist heat, Taping, Manual therapy, and Re-evaluation  PLAN FOR NEXT SESSION: Monitor spO2 during activity; 6 minute walk test; reassess HEP; progress hip strengthening and  endurance    Janene Harvey, PT, DPT 08/02/22 9:40 AM  [facility] at [facility] [address], [address] [place], [place] [address] Phone # [phone] Fax # [phone]

## 2022-08-02 ENCOUNTER — Ambulatory Visit: Payer: 59 | Attending: Family Medicine | Admitting: Occupational Therapy

## 2022-08-02 ENCOUNTER — Other Ambulatory Visit: Payer: Self-pay

## 2022-08-02 ENCOUNTER — Ambulatory Visit: Payer: 59 | Admitting: Physical Therapy

## 2022-08-02 ENCOUNTER — Encounter: Payer: Self-pay | Admitting: Physical Therapy

## 2022-08-02 ENCOUNTER — Ambulatory Visit: Payer: 59

## 2022-08-02 DIAGNOSIS — R262 Difficulty in walking, not elsewhere classified: Secondary | ICD-10-CM

## 2022-08-02 DIAGNOSIS — M6281 Muscle weakness (generalized): Secondary | ICD-10-CM | POA: Insufficient documentation

## 2022-08-02 DIAGNOSIS — R1313 Dysphagia, pharyngeal phase: Secondary | ICD-10-CM | POA: Diagnosis not present

## 2022-08-02 DIAGNOSIS — R2689 Other abnormalities of gait and mobility: Secondary | ICD-10-CM | POA: Diagnosis not present

## 2022-08-02 NOTE — Therapy (Signed)
OUTPATIENT SPEECH LANGUAGE PATHOLOGY SWALLOW EVALUATION   Patient Name: [name] MRN: [id number] DOB:21-Feb-1956, 66 y.o., male Today's Date: 08/02/2022  PCP: [name], MD REFERRING PROVIDER: [name], MD (referring),  [name], MD (documentation)  END OF SESSION:  End of Session - 08/02/22 0923     Visit Number 1    Number of Visits 17    Date for SLP Re-Evaluation 10/11/21    SLP Start Time 0809   7 minutes late   SLP Stop Time  0845    SLP Time Calculation (min) 36 min    Activity Tolerance Patient tolerated treatment well             Past Medical History:  Diagnosis Date   Alopecia    s/p chemotherapy   Arthritis    Bursitis    pt. denies   Cancer ([place]) 10/2007    tongue/squamous cell,stg IV,HPV positive   Diabetes mellitus without complication ([place])    takes Amaryl,Januvia,and Metformin,daily   DVT, lower extremity ([place]) 2009   side effect from chemo and has blood clot left leg-wears compression stockings;was on Coumadin for 18months and has been off 5 yrs   History of chemotherapy    Cisplatin/Taxotere,5-FU   History of radiation therapy 01/28/08-03/11/08   left base tongue   History of shingles    Hyperlipidemia    takes Vytorin daily   Hypothyroidism    takes Synthroid daily   Joint pain    Joint swelling    Neuropathy    left foot   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 denies   Right kidney mass    Seborrheic keratosis    multiple on back   Past Surgical History:  Procedure Laterality Date   BIOPSY  07/07/2022   Procedure: BIOPSY;  Surgeon: Loney Laurence, DO;  Location: [facility];  Service: Gastroenterology;;   CARDIAC CATHETERIZATION     CHEST TUBE INSERTION  07/06/2022   Procedure: CHEST TUBE INSERTION;  Surgeon: Collene Gobble, MD;  Location: [facility];  Service: Cardiopulmonary;;   ESOPHAGOGASTRODUODENOSCOPY (EGD) WITH  PROPOFOL N/A 07/07/2022   Procedure: ESOPHAGOGASTRODUODENOSCOPY (EGD) WITH PROPOFOL;  Surgeon: Loney Laurence, DO;  Location: [facility];  Service: Gastroenterology;  Laterality: N/A;   feeding tube placed  2009 and then removed   [place] Right 08/05/2020   Procedure: LYMPH NODE DISSECTION;  Surgeon: Raynelle Bring, MD;  Location: [facility];  Service: Urology;  Laterality: Right;   MOUTH SURGERY     PILONIDAL CYST / SINUS EXCISION  1974   port a cath placement     PORT-A-CATH REMOVAL  2010   right wrist fracture Right 2004   ROBOT ASSISTED LAPAROSCOPIC NEPHRECTOMY Right 08/05/2020   Procedure: XI ROBOTIC ASSISTED LAPAROSCOPIC RADICAL NEPHRECTOMY/PARTIAL ADRENALECTOMY;  Surgeon: Raynelle Bring, MD;  Location: [facility];  Service: Urology;  Laterality: Right;   SHOULDER ARTHROSCOPY WITH DISTAL CLAVICLE RESECTION Right 10/26/2016   Procedure: SHOULDER ARTHROSCOPY WITH DISTAL CLAVICLE RESECTION;  Surgeon: Justice Britain, MD;  Location: [facility];  Service: Orthopedics;  Laterality: Right;   SHOULDER ARTHROSCOPY WITH ROTATOR CUFF REPAIR AND SUBACROMIAL DECOMPRESSION Left 07/16/2014   Procedure: LEFT SHOULDER ARTHROSCOPY WITH ROTATOR CUFF REPAIR AND SUBACROMIAL DECOMPRESSION/DISTAL CLAVICLE RESECTION;  Surgeon: Marin Shutter, MD;  Location: [facility];  Service: Orthopedics;  Laterality: Left;   SHOULDER ARTHROSCOPY WITH ROTATOR  CUFF REPAIR AND SUBACROMIAL DECOMPRESSION Right 10/26/2016   Procedure: RIGHT SHOULDER ARTHROSCOPY WITH ROTATOR CUFF REPAIR AND SUBACROMIAL DECOMPRESSION;  Surgeon: Justice Britain, MD;  Location: [facility];  Service: Orthopedics;  Laterality: Right;  requests 2hrs   teeth extraction #9     Patient Active Problem List   Diagnosis Date Noted   Acute respiratory failure with hypoxia ([place]) 07/11/2022   Leukocytosis 07/11/2022   Dysphagia 07/11/2022   Chronic kidney disease (CKD), stage III (moderate) ([facility]) 07/11/2022   Anemia of chronic disease  07/11/2022   Thrombocytosis 07/11/2022   Pleural effusion 07/06/2022   Aspiration pneumonia ([place]) 07/05/2022   AKI (acute kidney injury) ([place]) 08/04/2021   History of DVT (deep vein thrombosis) 08/04/2021   History of pulmonary embolus (PE) 76/54/6503   Acute metabolic acidosis 54/65/6812   DKA (diabetic ketoacidosis) ([place]) 08/03/2021   Elevated serum creatinine 04/06/2021   Renal cell carcinoma, right ([place]) 08/05/2020   Thyroid activity decreased    Acute pulmonary embolism ([place]) 12/07/2014   Diabetes mellitus type 2 in nonobese ([place]) 12/07/2014   Cancer ([place])    History of angioplasty    Depression    DVT, lower extremity ([place])    Osteoradionecrosis of mandible    Malignant neoplasm of base of tongue ([place]) 07/20/2011   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/20/2011   Pulmonary embolism ([place]) 07/20/2011    ONSET DATE: 2009 after head/neck radiation, script dated 07-14-22   REFERRING DIAG: J18.9 (ICD-10-CM) - Community acquired pneumonia of left lung, unspecified part of lung   THERAPY DIAG:  Dysphagia, pharyngeal phase  Rationale for Evaluation and Treatment: Rehabilitation  SUBJECTIVE:   SUBJECTIVE STATEMENT: "I can't get rid of this mucous." "I did see a lady - she gave me exercises but I can't find them." Pt accompanied by: self  PERTINENT HISTORY: 66 y.o. male presented with worsening shortness of breath and cough. Chest CT 11/1: "Interval worsening of diffuse bronchial wall thickening with  right lower lobe and right middle lobe development of atypical  infection." OP MBS 05/12/2022 with silent aspiration of all consistencies of liquid.  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  Visited ENT on 03/08/2022 with history as follows: Pt "had base of tongue cancer in 2009 treated with radiation and chemotherapy with cure. He had kidney cancer in 2021. He has noticed more hoarseness with more talking over the past six months. Bulkier foods do not pass very well with swallowing. He coughs a lot when eating. He is able to swallow liquids fine." Pt has a history of left vocal cord paralysis (decreased movmenet of left VF compared to right, good glottic closure, no muscle tension patterns, laryngeal edema is minimal on fiberoptic exam) and tongue cancer. CT on 03/23/22 shows no mass, asymmetric glottic consistent with history of paralysis. No mass on reccurent laryngeal nerve. Pt underwent esophagram on 03/15/22, ordered by Dr Redmond Baseman due to dysphagia. Pt aspirated thick barium and study was terminated. MBS recommended by radiologist.  PAIN:  Are you having pain? No  FALLS: Has patient fallen in last 6 months?  No  LIVING ENVIRONMENT: Lives with: lives with their spouse Lives in: House/apartment  PLOF:  Level of assistance: Independent with ADLs Employment: Retired  PATIENT GOALS:  Improve swallowing  OBJECTIVE:   DIAGNOSTIC FINDINGS:  DG CHEST 07/05/22  IMPRESSION: There is interval appearance of large area of opacification in the left mid and left lower lung fields. Findings suggest pneumonia and large effusion. Part of the effusion appears to be loculated along the lateral chest wall. Possibility of empyema is not excluded. Follow-up CT may be considered.     RECOMMENDATIONS FROM OBJECTIVE SWALLOW STUDY (MBSS/FEES):   Objective swallow impairments: Pt demonstrates an oropharyngeal dysphagia characterized by decreased mobility across pharyngeal structures that leads to mild silent aspiration of liquids, regardless of texture. Pt can achieve adequate glottic closure at the height of the swallow, but epiglottic deflection, reduced  laryngeal elevation and reduced UES opening lead to penetration and residue that falls into the airway just as glottis opens. SLP attempted a variety of compenstory strategies including head turn left, head tilt right, chin tuck, supraglottic swallow. None prevented aspiration. A super-superglotttic swallow may be beneficial, mostly because pt can eject most aspirate with a cued cough and throat clear. With solids there is mild to moderate residue in pyriforms and valleculae due to the the same weakness. Pt usually uses a liquid wash to clear. This is likely the cause of pts globus sensation. A chin tuck with solids did clear a bit more of the stasis than an upright swallow. Effortful swallow not effective. Provided visual feedback to pt and provided extensive education and recommendations post session. Would not recommend any texture modification as there is no beenfit to thickened liquids for this pt. Futhermore, pt has not had any recent repiratory infection that would warrant excessive precaution. However, recommended pt: 1. trial a chin tuck with solids, 2. cough and clear throat frequently with intake even if he doesnt feel the need to do so, 3. complete thorough oral care to reduce bacterial load with aspiration, 4. inform providers of dysphagia and 5. consider f/u with SLP for home exercise program. 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 Exercise and tx targeting pharyngeal musculature could be helpful. Objective recommended compensations:  PRECAUTIONS: Slow rate;Small sips/bites;Follow solids with liquid;Clear throat intermittently;Clear throat after each swallow;Chin tuck BEDSIDE SWALLOW EVAL RESULTS NOVEMBER 2023: Clinical Impression  Pt presents with clinical indicators of pharyngeal dysphagia with known hx of chronic, silent aspiration s/p rad tx for oropharyngeal CA 2009.  Pt completed OP MBSS on 05/12/2022 revealing  "oropharyngeal dysphagia characterized by decreased mobility across pharyngeal structures that leads to mild silent aspiration of liquids, regardless of texture." Recommendations at that time to continue regular diet with thin liquids as modified consistencies were of no benefit.  Aspiration precaution recommendations from MBS as follows: " 1. trial a chin tuck with solids, 2. cough and clear throat frequently with intake even if he doesnt feel the need to do so, 3. complete thorough oral care to reduce bacterial load with aspiration, 4. inform providers of dysphagia and 5. consider f/u with SLP for home exercise program."  These were reviewed with pt.  Pt reports that he did not have any speech therapy follow up after this assessment, but he is agreeable to trial of swallow exercise program in house.  Pt will likely require 6-8 weeks of intensive swallow exercise program to effect change in swallow function and should follow with OP SLP for continued dysphagia management.  Pt with hx VF paralysis.  This was never directly addressed.  Pt's vocal quality is strong and clear in conversation.  Today pt exhibited intermittent throat clear with thin liquid and regular solids.  On palpation, laryngeal elevation was reduced.  Pt achieved greater hyolaryngeal excursion with a sip of water than with voluntary swallow without bolus trial (dry swallow).  Pt notes that soft foods (e.g. applesauce) are easier to eat for him.  There were no clinical s/s of puree.  Even in the absence of clinical s/s of aspiration, there is still a risk for prandial aspiration given hx of silent aspiration of liquid.  Pt would like to resume regular texture diet with thin liquid. He does still c/o of feeling of stasis with solid textures regularly and may benefit from further assessment and possible treatment for esophageal narrowing.  He states he would like to address the feelings of stasis with PO intake.  Consider GI consult if pt is a  candidate for espohageal dilation.  Pt likely does not need a repeat instrumental swallow evaluation at this time.  It is unlikely that swallow function has changed since September, and pt would like to continue oral diet at this time.   Diet Recommendation Regular;Thin liquid  Liquid Administration via: Cup;Straw Medication Administration:  (As tolerated) Supervision: Patient able to self feed  Compensations:  Slow rate; Small sips/bites; Chin tuck with solids; Follow solids with liquid; Clear throat regularly  COGNITION: Overall cognitive status: Within functional limits for tasks assessed  ORAL MOTOR EXAMINATION: Overall status: Impaired:   Lingual: Right (Strength) Pt neck was palpated. Significant muscle fibrosis was noted bilaterally lateral to thyroid cartilage to the posterior margins of the sternoceidomastoids, and profound fibrosis in submental musculature between pt's chin and thyroid- and cricoid cartilages.  CLINICAL SWALLOW ASSESSMENT:   Current diet: Dysphagia 3 (mechanical soft), Dysphagia 2 (chopped/minced), Dysphagia 1 (puree), thin liquids, and some regular diet items, but staying away from bread and drier foods Dentition: adequate natural dentition Patient directly observed with POs: Yes: dysphagia 3 (soft), dysphagia 1 (puree), and thin liquids  Feeding: able to feed self Liquids provided by: cup Oral phase signs and symptoms:  none today Pharyngeal phase signs and symptoms: immediate throat clear, immediate cough, and delayed cough SLP had to cue pt to alternate bite/sip, and to throat clear intermittently and swallow.  PATIENT REPORTED OUTCOME MEASURES (PROM): EAT-10: provided first session   TODAY'S TREATMENT:                                                                                                                                         DATE:   08/02/22: SLP reviewed MBS with pt from September and precautions with POs. Pt req'd cues for alternating  bite/sip and clear throat intermittently (SLP encouraged pt to clear throat and reswallow after every bite of solids). By session end pt was modified independent with precautions (handout from SLP being used). SLP then educated pt about three swallow exercises: effortful swallow, Mendelsohn, and Super-supraglottic. Pt was  mod A usually, faded to modified independent with all exercises. He was told he will need to do these until 10-05-22 and then another MBS will likely be recommended.   PATIENT EDUCATION: Education details: see "today's treatment" Person educated: Patient Education method: Explanation, Demonstration, Verbal cues, and Handouts Education comprehension: verbalized understanding, returned demonstration, verbal cues required, and needs further education   ASSESSMENT:  CLINICAL IMPRESSION: Patient is a 66 y.o. male who was seen today for assessment of swallowing precautions and of HEP. Pt had not completed HEP since d/c from hospital due to not locating his HEP upon return from hospital. Pt reports his recent GI exam revealed he did NOT require esophageal dilitation/dilation.    OBJECTIVE IMPAIRMENTS: include dysphagia. These impairments are limiting patient from safety when swallowing. Factors affecting potential to achieve goals and functional outcome are previous level of function and severity of impairments. Patient will benefit from skilled SLP services to address above impairments and improve overall function.  REHAB POTENTIAL: Fair given above factors   GOALS: Goals reviewed with patient? Yes  SHORT TERM GOALS: Target date: 09-10-22  Pt will complete HEP with rare min A in two sessions Baseline: Goal status: INITIAL  2.  Pt will follow aspiration precautions from [facility] 05-12-22 with rare min A in 2 sessions Baseline:  Goal status: INITIAL  3.  Pt will tell SLP 3 overt s/sx of aspiration PNA with modified independence Baseline:  Goal status: INITIAL  4.  Pt will  complete PROM in first two therapy sessions Baseline:  Goal status: INITIAL    LONG TERM GOALS: Target date: 10-11-22  Pt will follow aspiration precautions from [facility] 05-12-22 with modified independence in 2 sessions Baseline:  Goal status: INITIAL  2.  Pt will complete HEP with modified independence n two sessions Baseline:  Goal status: INITIAL  3.  Pt will achieve improvement in measurement of PROM in last 1-2 sessions Baseline:  Goal status: INITIAL  4.  Pt will undergo follow up MBS to assess progress Baseline:  Goal status: INITIAL   PLAN:  SLP FREQUENCY: 2x/week  SLP DURATION:  17 sessions until 10-11-21  PLANNED INTERVENTIONS: Aspiration precaution training, Pharyngeal strengthening exercises, Diet toleration management , Environmental controls, Trials of upgraded texture/liquids, Internal/external aids, SLP instruction and feedback, Compensatory strategies, and Patient/family education    Charlie [facility], [place] 08/02/2022, 9:24 AM

## 2022-08-02 NOTE — Patient Instructions (Signed)
SWALLOWING EXERCISES Do these for 8 weeks (until 10/05/22), then 3 times per week afterwards  Effortful Swallows - Press your tongue against the roof of your mouth for 3 seconds, then squeeze the muscles in your neck while you swallow your saliva or a sip of water - Repeat 10-15 times, 2-3 times a day, and use whenever you eat or drink  [employer] - "half swallow" exercise - Start to swallow, and squeeze hard with the muscles of the throat - Hold the squeeze for 5-7 seconds and then relax - Repeat 10-15 times, 2-3 times a day *use a couple drops of water if your mouth gets dry*       3.  "Super Swallow"  - Take a breath and hold it  (get a few drops of water)  - Bear down (like pushing your bowels)  - Swallow then IMMEDIATELY cough  - Repeat 10 times, 2-3 times a day

## 2022-08-02 NOTE — Therapy (Signed)
OUTPATIENT OCCUPATIONAL THERAPY NEURO EVALUATION  Patient Name: [name] MRN: [id number] DOB:1956/02/20, 66 y.o., male Today's Date: 08/02/2022  PCP: [name], MD REFERRING PROVIDER: [name], MD  END OF SESSION:  OT End of Session - 08/02/22 1017     Visit Number 1    Number of Visits 7    Date for OT Re-Evaluation 09/15/22    Authorization Type [employer]    OT Start Time [phone]    OT Stop Time 1011    OT Time Calculation (min) 40 min             Past Medical History:  Diagnosis Date   Alopecia    s/p chemotherapy   Arthritis    Bursitis    pt. denies   Cancer ([place]) 10/2007    tongue/squamous cell,stg IV,HPV positive   Diabetes mellitus without complication ([place])    takes Amaryl,Januvia,and Metformin,daily   DVT, lower extremity ([place]) 2009   side effect from chemo and has blood clot left leg-wears compression stockings;was on Coumadin for 75months and has been off 5 yrs   History of chemotherapy    Cisplatin/Taxotere,5-FU   History of radiation therapy 01/28/08-03/11/08   left base tongue   History of shingles    Hyperlipidemia    takes Vytorin daily   Hypothyroidism    takes Synthroid daily   Joint pain    Joint swelling    Neuropathy    left foot   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 denies   Right kidney mass    Seborrheic keratosis    multiple on back   Past Surgical History:  Procedure Laterality Date   BIOPSY  07/07/2022   Procedure: BIOPSY;  Surgeon: Loney Laurence, DO;  Location: [facility];  Service: Gastroenterology;;   CARDIAC CATHETERIZATION     CHEST TUBE INSERTION  07/06/2022   Procedure: CHEST TUBE INSERTION;  Surgeon: Collene Gobble, MD;  Location: [facility];  Service: Cardiopulmonary;;   ESOPHAGOGASTRODUODENOSCOPY (EGD) WITH PROPOFOL N/A 07/07/2022   Procedure: ESOPHAGOGASTRODUODENOSCOPY (EGD) WITH PROPOFOL;  Surgeon:  Loney Laurence, DO;  Location: [facility];  Service: Gastroenterology;  Laterality: N/A;   feeding tube placed  2009 and then removed   [place] Right 08/05/2020   Procedure: LYMPH NODE DISSECTION;  Surgeon: Raynelle Bring, MD;  Location: [facility];  Service: Urology;  Laterality: Right;   MOUTH SURGERY     PILONIDAL CYST / SINUS EXCISION  1974   port a cath placement     PORT-A-CATH REMOVAL  2010   right wrist fracture Right 2004   ROBOT ASSISTED LAPAROSCOPIC NEPHRECTOMY Right 08/05/2020   Procedure: XI ROBOTIC ASSISTED LAPAROSCOPIC RADICAL NEPHRECTOMY/PARTIAL ADRENALECTOMY;  Surgeon: Raynelle Bring, MD;  Location: [facility];  Service: Urology;  Laterality: Right;   SHOULDER ARTHROSCOPY WITH DISTAL CLAVICLE RESECTION Right 10/26/2016   Procedure: SHOULDER ARTHROSCOPY WITH DISTAL CLAVICLE RESECTION;  Surgeon: Justice Britain, MD;  Location: [facility];  Service: Orthopedics;  Laterality: Right;   SHOULDER ARTHROSCOPY WITH ROTATOR CUFF REPAIR AND SUBACROMIAL DECOMPRESSION Left 07/16/2014   Procedure: LEFT SHOULDER ARTHROSCOPY WITH ROTATOR CUFF REPAIR AND SUBACROMIAL DECOMPRESSION/DISTAL CLAVICLE RESECTION;  Surgeon: Marin Shutter, MD;  Location: [facility];  Service: Orthopedics;  Laterality: Left;   SHOULDER ARTHROSCOPY WITH ROTATOR CUFF REPAIR AND SUBACROMIAL DECOMPRESSION Right 10/26/2016   Procedure: RIGHT SHOULDER ARTHROSCOPY WITH  ROTATOR CUFF REPAIR AND SUBACROMIAL DECOMPRESSION;  Surgeon: Justice Britain, MD;  Location: [facility];  Service: Orthopedics;  Laterality: Right;  requests 2hrs   teeth extraction #9     Patient Active Problem List   Diagnosis Date Noted   Acute respiratory failure with hypoxia ([place]) 07/11/2022   Leukocytosis 07/11/2022   Dysphagia 07/11/2022   Chronic kidney disease (CKD), stage III (moderate) ([facility]) 07/11/2022   Anemia of chronic disease 07/11/2022   Thrombocytosis 07/11/2022   Pleural effusion 07/06/2022   Aspiration pneumonia ([place])  07/05/2022   AKI (acute kidney injury) ([place]) 08/04/2021   History of DVT (deep vein thrombosis) 08/04/2021   History of pulmonary embolus (PE) 42/35/3614   Acute metabolic acidosis 43/15/4008   DKA (diabetic ketoacidosis) ([place]) 08/03/2021   Elevated serum creatinine 04/06/2021   Renal cell carcinoma, right ([place]) 08/05/2020   Thyroid activity decreased    Acute pulmonary embolism ([place]) 12/07/2014   Diabetes mellitus type 2 in nonobese ([place]) 12/07/2014   Cancer ([place])    History of angioplasty    Depression    DVT, lower extremity ([place])    Osteoradionecrosis of mandible    Malignant neoplasm of base of tongue ([place]) 07/20/2011   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/20/2011   Pulmonary embolism ([place]) 07/20/2011    ONSET DATE: 07/05/22  REFERRING DIAG: J18.9 (ICD-10-CM) - Community acquired pneumonia of left lung, unspecified part of lung  THERAPY DIAG:  Muscle weakness (generalized)  Other abnormalities of gait and mobility  Rationale for Evaluation and Treatment: Rehabilitation  SUBJECTIVE:   SUBJECTIVE STATEMENT: 66 y.o. male who presented with worsening shortness of breath and cough on 07/05/22. Chest x-ray showed left-sided pneumonia with large effusion with possibility of empyema. Underwent pigtail catheter placement on 07/06/2022. Underwent EGD on 07/07/2022. Now undergoing Pleural Fibrinolysis. Pt reports that he still has a lot of mucous, seeing [facility].  Pt reports that he notices decreased UE strength with inability to start up leaf blower.  Pt reports still taking frequent rest breaks during IADLs. 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  PRECAUTIONS: Other: frozen shoulder L  WEIGHT BEARING RESTRICTIONS: No  PAIN:  Are you having pain? No  FALLS: Has patient fallen in last 6 months? No  LIVING ENVIRONMENT: Lives with: lives with their spouse Lives in: House/apartment Stairs: Yes: Internal: 14 steps; on right going up and on left going up and External: 3 steps; none Has following equipment at home: shower chair and walking stick when he goes out walking  PLOF: Independent  PATIENT GOALS: to get my strength back  OBJECTIVE:   HAND DOMINANCE: Right  ADLs: Overall ADLs: Pt reports that he is no longer sitting in the chair in the shower anymore.  He reports that he is sweeping/vacuuming some. Mod I with bathing and dressing.  Reports that he notifies his wife that he is going to shower.   Equipment: Walk in shower  IADLs: Shopping: now limits to short grocery trips due to decreased endurance. Orders grocery pickup for larger grocery runs Light housekeeping: vacuuming, cleaning bathroom.  Reports he "does a little bit and sit down" Meal Prep: enjoys cooking, feels that he is able to cook at  baseline level Community mobility: driving Medication management: no issues, taking them in applesauce  MOBILITY STATUS:  decreased endurance  POSTURE COMMENTS:  No Significant postural limitations  ACTIVITY TOLERANCE: Activity tolerance: HR and O2 fluctuate with activity  FUNCTIONAL OUTCOME MEASURES: Ron Parker Index of Independence in Activities of Daily Living: 6/6 [place] IADL Scale: 7/7  UPPER EXTREMITY ROM:    Active ROM Right eval Left eval  Shoulder flexion 160 90  Shoulder abduction    Shoulder adduction    Shoulder extension    Shoulder internal rotation    Shoulder external rotation    Elbow flexion    Elbow extension    Wrist flexion    Wrist extension    Wrist ulnar deviation    Wrist radial deviation    Wrist pronation    Wrist  supination    (Blank rows = not tested)  UPPER EXTREMITY MMT:     MMT Right eval Left eval  Shoulder flexion 4+/5 4/5  Shoulder abduction    Shoulder adduction    Shoulder extension    Shoulder internal rotation    Shoulder external rotation    Middle trapezius    Lower trapezius    Elbow flexion 4+/5 4+/5  Elbow extension 4+/5 4+/5  Wrist flexion    Wrist extension    Wrist ulnar deviation    Wrist radial deviation    Wrist pronation    Wrist supination    (Blank rows = not tested)  HAND FUNCTION: Grip strength: Right: 74 lbs; Left: 69 lbs, Lateral pinch: Right: 16 lbs, Left: 14.5 lbs, and 3 point pinch: Right: 18 lbs, Left: 14 lbs  COORDINATION: 9 Hole Peg test: Right: 26.0 sec; Left: 22.82 sec  COGNITION: Overall cognitive status: Within functional limits for tasks assessed  VISION: Subjective report: no difficulties Baseline vision: Wears glasses all the time  OBSERVATIONS: HR elevated and O2 sats drop with activity, requires frequent rest breaks.   TODAY'S TREATMENT:                                                           NA, eval only  PATIENT EDUCATION: Education details: Educated on role and purpose of OT as well as potential interventions and goals for therapy based on initial evaluation findings. Person educated: Patient Education method: Explanation Education comprehension: verbalized understanding  HOME EXERCISE PROGRAM: TBD   GOALS: Goals reviewed with patient? No  SHORT TERM GOALS: Target date: 08/26/23  Pt will be independent with HEP for strengthening. Baseline: Goal status: INITIAL  2.  Pt will report able to vacuum entire room without rest break. Baseline:  Goal status: INITIAL  3.  Pt will verbalize understanding of energy conservation strategies and report 2 being utilized at home. Baseline:  Goal status: INITIAL   LONG TERM GOALS: Target date: 09/15/22  Pt will be independent with advanced HEP for strengthening to allow  for resumption of leisure tasks (Yard work and fishing).  Baseline:  Goal status: INITIAL  2.  Pt will report able to vacuum 2-3 entire rooms without rest break. Baseline:  Goal status: INITIAL  3.  Pt will demonstrate and/or report increased BUE strength as needed to start up leaf blower. Baseline:  Goal status: INITIAL  4.  Pt will complete 15 mins moderate challenging dynamic standing  activity without significant change in HR or O2 sats. Baseline:  Goal status: INITIAL  ASSESSMENT:  CLINICAL IMPRESSION: Patient is a 66 y.o. male who was seen today for occupational therapy evaluation for decreased endurance and activity tolerance, as well as strength post hospitalization/treatment for pneumonia. Pt currently lives with spouse in a 2 story home with bedroom/bathroom upstairs and is retired Conservation officer, nature prior to onset. Pt enjoys cooking, shore fishing, and doing yard work. Pt will benefit from skilled occupational therapy services to address strength, ROM, balance, activity tolerance/endurance, safety awareness, introduction of compensatory strategies/AE prn, and implementation of an HEP to improve participation and safety during IADLs and leisure pursuits.   PERFORMANCE DEFICITS: in functional skills including IADLs, ROM, strength, flexibility, balance, endurance, and cardiopulmonary status limiting function and psychosocial skills including environmental adaptation and routines and behaviors.   IMPAIRMENTS: are limiting patient from IADLs and leisure.   CO-MORBIDITIES: may have co-morbidities  that affects occupational performance. Patient will benefit from skilled OT to address above impairments and improve overall function.  MODIFICATION OR ASSISTANCE TO COMPLETE EVALUATION: No modification of tasks or assist necessary to complete an evaluation.  OT OCCUPATIONAL PROFILE AND HISTORY: Detailed assessment: Review of records and additional review of physical, cognitive, psychosocial  history related to current functional performance.  CLINICAL DECISION MAKING: LOW - limited treatment options, no task modification necessary  REHAB POTENTIAL: Good  EVALUATION COMPLEXITY: Low    PLAN:  OT FREQUENCY: 1x/week  OT DURATION: 6 weeks  PLANNED INTERVENTIONS: self care/ADL training, therapeutic exercise, therapeutic activity, balance training, functional mobility training, ultrasound, moist heat, cryotherapy, patient/family education, energy conservation, coping strategies training, and DME and/or AE instructions  RECOMMENDED OTHER SERVICES: NA  CONSULTED AND AGREED WITH PLAN OF CARE: Patient  PLAN FOR NEXT SESSION: Initiate strengthening HEP with theraband and/or weights   Teira Arcilla, [place], OTR/L 08/02/2022, 10:19 AM

## 2022-08-04 NOTE — Therapy (Signed)
OUTPATIENT PHYSICAL THERAPY NEURO TREATMENT   Patient Name: [name] MRN: [id number] DOB:01/11/1956, 66 y.o., male Today's Date: 08/04/2022   PCP: [name], MD  REFERRING PROVIDER: [name], MD  END OF SESSION:    Past Medical History:  Diagnosis Date   Alopecia    s/p chemotherapy   Arthritis    Bursitis    pt. denies   Cancer ([place]) 10/2007    tongue/squamous cell,stg IV,HPV positive   Diabetes mellitus without complication ([place])    takes Amaryl,Januvia,and Metformin,daily   DVT, lower extremity ([place]) 2009   side effect from chemo and has blood clot left leg-wears compression stockings;was on Coumadin for 71months and has been off 5 yrs   History of chemotherapy    Cisplatin/Taxotere,5-FU   History of radiation therapy 01/28/08-03/11/08   left base tongue   History of shingles    Hyperlipidemia    takes Vytorin daily   Hypothyroidism    takes Synthroid daily   Joint pain    Joint swelling    Neuropathy    left foot   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 denies   Right kidney mass    Seborrheic keratosis    multiple on back   Past Surgical History:  Procedure Laterality Date   BIOPSY  07/07/2022   Procedure: BIOPSY;  Surgeon: Loney Laurence, DO;  Location: [facility];  Service: Gastroenterology;;   CARDIAC CATHETERIZATION     CHEST TUBE INSERTION  07/06/2022   Procedure: CHEST TUBE INSERTION;  Surgeon: Collene Gobble, MD;  Location: [facility];  Service: Cardiopulmonary;;   ESOPHAGOGASTRODUODENOSCOPY (EGD) WITH PROPOFOL N/A 07/07/2022   Procedure: ESOPHAGOGASTRODUODENOSCOPY (EGD) WITH PROPOFOL;  Surgeon: Loney Laurence, DO;  Location: [facility];  Service: Gastroenterology;  Laterality: N/A;   feeding tube placed  2009 and then removed   [place] Right 08/05/2020   Procedure: LYMPH NODE DISSECTION;  Surgeon:  Raynelle Bring, MD;  [facility];  Service: Urology;  Laterality: Right;   MOUTH SURGERY     PILONIDAL CYST / SINUS EXCISION  1974   port a cath placement     PORT-A-CATH REMOVAL  2010   right wrist fracture Right 2004   ROBOT ASSISTED LAPAROSCOPIC NEPHRECTOMY Right 08/05/2020   Procedure: XI ROBOTIC ASSISTED LAPAROSCOPIC RADICAL NEPHRECTOMY/PARTIAL ADRENALECTOMY;  Surgeon: Raynelle Bring, MD;  [facility];  Service: Urology;  Laterality: Right;   SHOULDER ARTHROSCOPY WITH DISTAL CLAVICLE RESECTION Right 10/26/2016   Procedure: SHOULDER ARTHROSCOPY WITH DISTAL CLAVICLE RESECTION;  Surgeon: Justice Britain, MD;  Location: [facility];  Service: Orthopedics;  Laterality: Right;   SHOULDER ARTHROSCOPY WITH ROTATOR CUFF REPAIR AND SUBACROMIAL DECOMPRESSION Left 07/16/2014   Procedure: LEFT SHOULDER ARTHROSCOPY WITH ROTATOR CUFF REPAIR AND SUBACROMIAL DECOMPRESSION/DISTAL CLAVICLE RESECTION;  Surgeon: Marin Shutter, MD;  Location: [facility];  Service: Orthopedics;  Laterality: Left;   SHOULDER ARTHROSCOPY WITH ROTATOR CUFF REPAIR AND SUBACROMIAL DECOMPRESSION Right 10/26/2016   Procedure: RIGHT SHOULDER ARTHROSCOPY WITH ROTATOR CUFF REPAIR AND SUBACROMIAL DECOMPRESSION;  Surgeon: Justice Britain, MD;  Location: [facility];  Service: Orthopedics;  Laterality: Right;  requests 2hrs   teeth extraction #9     Patient Active Problem List   Diagnosis Date Noted   Acute respiratory failure with hypoxia ([place]) 07/11/2022   Leukocytosis 07/11/2022   Dysphagia 07/11/2022   Chronic kidney disease (CKD), stage III (  moderate) ([place]) 07/11/2022   Anemia of chronic disease 07/11/2022   Thrombocytosis 07/11/2022   Pleural effusion 07/06/2022   Aspiration pneumonia ([place]) 07/05/2022   AKI (acute kidney injury) ([place]) 08/04/2021   History of DVT (deep vein thrombosis) 08/04/2021   History of pulmonary embolus (PE) 53/66/4403   Acute metabolic acidosis 47/42/5956   DKA (diabetic ketoacidosis) ([place]) 08/03/2021   Elevated  serum creatinine 04/06/2021   Renal cell carcinoma, right ([place]) 08/05/2020   Thyroid activity decreased    Acute pulmonary embolism ([place]) 12/07/2014   Diabetes mellitus type 2 in nonobese ([place]) 12/07/2014   Cancer ([place])    History of angioplasty    Depression    DVT, lower extremity ([place])    Osteoradionecrosis of mandible    Malignant neoplasm of base of tongue ([place]) 07/20/2011   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/20/2011   Pulmonary embolism ([place]) 07/20/2011    ONSET DATE: 07/05/22  REFERRING DIAG: J18.9 (ICD-10-CM) - Community acquired pneumonia of left lung, unspecified part of lung  THERAPY DIAG:  No diagnosis found.  Rationale for Evaluation and Treatment: Rehabilitation  SUBJECTIVE:                                                                                                                                                                                             SUBJECTIVE STATEMENT: "Getting my energy back" since being in the hospital. Was D/c'd home and did not have HHPT. Was on supplemental O2 during hospitalization, but not at home. Reports that the main thing he has problems with is eating but also has limited endurance. Notes that walking to the mailbox is challenging, whereas he used to walk 3 miles a day as recently as October. Reports baseline dizziness upon standing up quickly- suspects this is from being on blood thinners.    Pt accompanied by: self  PERTINENT HISTORY: DM, HLD, neuropathy, R RTC repair, stage 4 tongue/squamous cell CA, R renal CA  PAIN:  Are you having pain? No  PRECAUTIONS: Other: per patient, aspiration precautions- no bread, no crackers, thin liquids okay  WEIGHT BEARING RESTRICTIONS: No  FALLS: Has patient fallen in last 6 months? No  LIVING ENVIRONMENT: Lives with: lives with their spouse Lives in: House/apartment Stairs:  3 steps to enter without handrail; 14 steps to 2nd floor  Has  following equipment at home: Shower bench  PLOF: Independent; retired; would like to return to hunting, fishing, woodworking   PATIENT GOALS: would like to return to hunting, fishing, woodworking   OBJECTIVE:     TODAY'S TREATMENT: 08/07/22 Activity Comments  Below measures were taken at time of initial evaluation unless otherwise specified:   DIAGNOSTIC FINDINGS: 07/24/22 chest xray: Similar to slightly increased loculated left hydropneumothorax. Trace thickening of the right inferolateral pleura may represent trace pleural effusion. Similar asymmetrically low left lung volumes with left basilar patchy opacities, likely atelectasis.  COGNITION: Overall cognitive status: Within functional limits for tasks assessed   SENSATION: Reports a little N/T in B feet from neuropathy  COORDINATION: Alternating pronation/supination: WNL B Alternating toe tap: WNL B Finger to nose: WNL B    POSTURE: rounded shoulders, forward head, and increased thoracic kyphosis  LOWER EXTREMITY ROM:     Active  Right Eval Left Eval  Hip flexion    Hip extension    Hip abduction    Hip adduction    Hip internal rotation    Hip external rotation    Knee flexion    Knee extension    Ankle dorsiflexion 5 5  Ankle plantarflexion    Ankle inversion    Ankle eversion     (Blank rows = not tested)  LOWER EXTREMITY MMT:    MMT (in sitting) Right Eval Left Eval  Hip flexion 4 4-  Hip extension    Hip abduction 4+ 4+  Hip adduction 4- 4  Hip internal rotation    Hip external rotation    Knee flexion 5 4+  Knee extension 5 5  Ankle dorsiflexion 4+ 4+  Ankle plantarflexion 4+ 4+  Ankle inversion    Ankle eversion    (Blank rows = not tested)   GAIT: Gait pattern: Good gait speed and step length but with L lateral trunk lean and thoracic kyphosis  Assistive device utilized: None Level of assistance: Complete Independence  FUNCTIONAL TESTS:         TODAY'S TREATMENT:                                                                                                                              DATE: 08/02/22    PATIENT EDUCATION: Education details: prognosis, POC, HEP, edu on exam findings  Person educated: Patient Education method: Explanation, Demonstration, Tactile cues, Verbal cues, and Handouts Education comprehension: verbalized understanding  HOME EXERCISE PROGRAM: Access Code: [email or web address] URL: [email or web address] Date: 08/02/2022 Prepared by: [facility]  Exercises - Sit to Stand with Counter Support  - 1 x daily - 5 x weekly - 3 sets - 10 reps - Gastroc Stretch on Wall  - 1 x daily - 5 x weekly - 2 sets - 30 hold - Standing Hip Abduction with Counter Support  - 1 x daily - 5 x weekly - 2 sets - 10 reps - Standing Hip Extension with Counter Support  - 1 x daily - 5 x weekly - 2 sets - 10 reps   GOALS: Goals reviewed with patient? Yes  SHORT TERM GOALS: Target date: 08/23/2022  Patient to be independent with  initial HEP. Baseline: HEP initiated Goal status: IN PROGRESS    LONG TERM GOALS: Target date: 09/13/2022  Patient to be independent with advanced HEP. Baseline: Not yet initiated  Goal status: IN PROGRESS  Patient to demonstrate B LE strength >/=4+/5.  Baseline: See above Goal status: IN PROGRESS  Patient to demonstrate 10 degrees B ankle dorsiflexion AROM WFL in order to improve efficiency with gait. Baseline: 5 deg B Goal status: IN PROGRESS  Patient to report tolerance for walking for 45 minutes without fatigue limiting.  Baseline: unable Goal status: IN PROGRESS  Patient to demonstrate 5xSTS test without UE support in <15 sec in order to decrease risk of falls.  Baseline: 13 sec with armrests  Goal status: IN PROGRESS  Patient to walk at least 1312 feet during 6 minute walk test in order to score closer to age-matched norms.   Baseline: NT Goal status: IN PROGRESS   ASSESSMENT:  CLINICAL IMPRESSION:  Patient is a 66 y/o M presenting to OPPT with c/o decreased endurance s/p hospitalization 11/01-11/10/23 for aspiration pneumonia. Patient was D/C'd home without supplemental O2. Reports fatige when walking to the mailbox however was walking 3 miles a day at baseline. Patient today presenting with rounded and forward head posture, limited ankle mobility, decreased B hip strength, gait deviations, increased fatigue and drop in O2 saturation with activity. Patient was educated on gentle stretching and strengthening HEP and reported understanding. Would benefit from skilled PT services 1-2 x/week for 6 weeks to address aforementioned impairments in order to optimize level of function.     OBJECTIVE IMPAIRMENTS: Abnormal gait, decreased activity tolerance, decreased endurance, decreased ROM, decreased strength, dizziness, impaired flexibility, improper body mechanics, and postural dysfunction.   ACTIVITY LIMITATIONS: carrying, lifting, bending, standing, squatting, stairs, bathing, and dressing  PARTICIPATION LIMITATIONS: meal prep, cleaning, laundry, shopping, community activity, yard work, and church  PERSONAL FACTORS: Age, Fitness, Past/current experiences, Time since onset of injury/illness/exacerbation, and 3+ comorbidities: DM, HLD, neuropathy, R RTC repair, stage 4 tongue/squamous cell CA, R renal CA  are also affecting patient's functional outcome.   REHAB POTENTIAL: Good  CLINICAL DECISION MAKING: Evolving/moderate complexity  EVALUATION COMPLEXITY: Moderate  PLAN:  PT FREQUENCY: 1-2x/week  PT DURATION: 6 weeks  PLANNED INTERVENTIONS: Therapeutic exercises, Therapeutic activity, Neuromuscular re-education, Balance training, Gait training, Patient/Family education, Self Care, Joint mobilization, Stair training, Vestibular training, Canalith repositioning, DME instructions, Aquatic Therapy, Dry Needling,  Electrical stimulation, Cryotherapy, Moist heat, Taping, Manual therapy, and Re-evaluation  PLAN FOR NEXT SESSION: Monitor spO2 during activity; 6 minute walk test; reassess HEP; progress hip strengthening and endurance    Janene Harvey, PT, DPT 08/04/22 11:45 AM  [facility] at [facility] [address] [place], [place] [address] Phone # [phone] Fax # [phone]

## 2022-08-07 ENCOUNTER — Telehealth: Payer: Self-pay | Admitting: Physical Therapy

## 2022-08-07 ENCOUNTER — Other Ambulatory Visit (HOSPITAL_COMMUNITY): Payer: Self-pay

## 2022-08-07 ENCOUNTER — Encounter: Payer: Self-pay | Admitting: Physical Therapy

## 2022-08-07 ENCOUNTER — Ambulatory Visit: Payer: 59 | Admitting: Physical Therapy

## 2022-08-07 ENCOUNTER — Ambulatory Visit: Payer: 59 | Attending: Family Medicine | Admitting: Occupational Therapy

## 2022-08-07 DIAGNOSIS — M6281 Muscle weakness (generalized): Secondary | ICD-10-CM | POA: Insufficient documentation

## 2022-08-07 DIAGNOSIS — R2689 Other abnormalities of gait and mobility: Secondary | ICD-10-CM | POA: Diagnosis not present

## 2022-08-07 DIAGNOSIS — R1313 Dysphagia, pharyngeal phase: Secondary | ICD-10-CM | POA: Diagnosis not present

## 2022-08-07 DIAGNOSIS — R262 Difficulty in walking, not elsewhere classified: Secondary | ICD-10-CM | POA: Diagnosis not present

## 2022-08-07 NOTE — Therapy (Signed)
OUTPATIENT OCCUPATIONAL THERAPY NEURO EVALUATION  Patient Name: [name] MRN: [id number] DOB:08/30/1956, 66 y.o., male Today's Date: 08/07/2022  PCP: [name], MD REFERRING PROVIDER: [name], MD  END OF SESSION:  OT End of Session - 08/07/22 1622     Visit Number 2    Number of Visits 7    Date for OT Re-Evaluation 09/15/22    Authorization Type [employer] [employer]    OT Start Time 8099    OT Stop Time 1616    OT Time Calculation (min) 42 min              Past Medical History:  Diagnosis Date   Alopecia    s/p chemotherapy   Arthritis    Bursitis    pt. denies   Cancer ([place]) 10/2007    tongue/squamous cell,stg IV,HPV positive   Diabetes mellitus without complication ([place])    takes Amaryl,Januvia,and Metformin,daily   DVT, lower extremity ([place]) 2009   side effect from chemo and has blood clot left leg-wears compression stockings;was on Coumadin for 21months and has been off 5 yrs   History of chemotherapy    Cisplatin/Taxotere,5-FU   History of radiation therapy 01/28/08-03/11/08   left base tongue   History of shingles    Hyperlipidemia    takes Vytorin daily   Hypothyroidism    takes Synthroid daily   Joint pain    Joint swelling    Neuropathy    left foot   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 denies   Right kidney mass    Seborrheic keratosis    multiple on back   Past Surgical History:  Procedure Laterality Date   BIOPSY  07/07/2022   Procedure: BIOPSY;  Surgeon: Loney Laurence, DO;  Location: [facility];  Service: Gastroenterology;;   CARDIAC CATHETERIZATION     CHEST TUBE INSERTION  07/06/2022   Procedure: CHEST TUBE INSERTION;  Surgeon: Collene Gobble, MD;  Location: [facility];  Service: Cardiopulmonary;;   ESOPHAGOGASTRODUODENOSCOPY (EGD) WITH PROPOFOL N/A 07/07/2022   Procedure: ESOPHAGOGASTRODUODENOSCOPY (EGD) WITH PROPOFOL;  Surgeon:  Loney Laurence, DO;  Location: [facility];  Service: Gastroenterology;  Laterality: N/A;   feeding tube placed  2009 and then removed   [place] Right 08/05/2020   Procedure: LYMPH NODE DISSECTION;  Surgeon: Raynelle Bring, MD;  [facility];  Service: Urology;  Laterality: Right;   MOUTH SURGERY     PILONIDAL CYST / SINUS EXCISION  1974   port a cath placement     PORT-A-CATH REMOVAL  2010   right wrist fracture Right 2004   ROBOT ASSISTED LAPAROSCOPIC NEPHRECTOMY Right 08/05/2020   Procedure: XI ROBOTIC ASSISTED LAPAROSCOPIC RADICAL NEPHRECTOMY/PARTIAL ADRENALECTOMY;  Surgeon: Raynelle Bring, MD;  [facility];  Service: Urology;  Laterality: Right;   SHOULDER ARTHROSCOPY WITH DISTAL CLAVICLE RESECTION Right 10/26/2016   Procedure: SHOULDER ARTHROSCOPY WITH DISTAL CLAVICLE RESECTION;  Surgeon: Justice Britain, MD;  Location: [facility];  Service: Orthopedics;  Laterality: Right;   SHOULDER ARTHROSCOPY WITH ROTATOR CUFF REPAIR AND SUBACROMIAL DECOMPRESSION Left 07/16/2014   Procedure: LEFT SHOULDER ARTHROSCOPY WITH ROTATOR CUFF REPAIR AND SUBACROMIAL DECOMPRESSION/DISTAL CLAVICLE RESECTION;  Surgeon: Marin Shutter, MD;  Location: [facility];  Service: Orthopedics;  Laterality: Left;   SHOULDER ARTHROSCOPY WITH ROTATOR CUFF REPAIR AND SUBACROMIAL DECOMPRESSION Right 10/26/2016   Procedure: RIGHT SHOULDER ARTHROSCOPY  WITH ROTATOR CUFF REPAIR AND SUBACROMIAL DECOMPRESSION;  Surgeon: Justice Britain, MD;  Location: [facility];  Service: Orthopedics;  Laterality: Right;  requests 2hrs   teeth extraction #9     Patient Active Problem List   Diagnosis Date Noted   Acute respiratory failure with hypoxia ([place]) 07/11/2022   Leukocytosis 07/11/2022   Dysphagia 07/11/2022   Chronic kidney disease (CKD), stage III (moderate) ([facility]) 07/11/2022   Anemia of chronic disease 07/11/2022   Thrombocytosis 07/11/2022   Pleural effusion 07/06/2022   Aspiration pneumonia ([place])  07/05/2022   AKI (acute kidney injury) ([place]) 08/04/2021   History of DVT (deep vein thrombosis) 08/04/2021   History of pulmonary embolus (PE) 16/06/9603   Acute metabolic acidosis 54/05/8118   DKA (diabetic ketoacidosis) ([place]) 08/03/2021   Elevated serum creatinine 04/06/2021   Renal cell carcinoma, right ([place]) 08/05/2020   Thyroid activity decreased    Acute pulmonary embolism ([place]) 12/07/2014   Diabetes mellitus type 2 in nonobese ([place]) 12/07/2014   Cancer ([place])    History of angioplasty    Depression    DVT, lower extremity ([place])    Osteoradionecrosis of mandible    Malignant neoplasm of base of tongue ([place]) 07/20/2011   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/20/2011   Pulmonary embolism ([place]) 07/20/2011    ONSET DATE: 07/05/22  REFERRING DIAG: J18.9 (ICD-10-CM) - Community acquired pneumonia of left lung, unspecified part of lung  THERAPY DIAG:  Muscle weakness (generalized)  Other abnormalities of gait and mobility  Rationale for Evaluation and Treatment: Rehabilitation  SUBJECTIVE:   SUBJECTIVE STATEMENT: Pt reports being busy today with a lot of appointments.  Reports tasking are getting "somewhat" easier. 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  PRECAUTIONS: Other: frozen shoulder L  WEIGHT BEARING RESTRICTIONS: No  PAIN:  Are you having pain? No  FALLS: Has patient fallen in last 6 months? No   PATIENT GOALS: to get my strength back  OBJECTIVE:   HAND DOMINANCE: Right  ADLs: Overall ADLs: Pt reports that he is no longer sitting in the chair in the shower anymore.  He  reports that he is sweeping/vacuuming some. Mod I with bathing and dressing.  Reports that he notifies his wife that he is going to shower.   Equipment: Walk in shower  IADLs: Shopping: now limits to short grocery trips due to decreased endurance. Orders grocery pickup for larger grocery runs Light housekeeping: vacuuming, cleaning bathroom.  Reports he "does a little bit and sit down" Meal Prep: enjoys cooking, feels that he is able to cook at baseline level Community mobility: driving Medication management: no issues, taking them in applesauce  MOBILITY STATUS:  decreased endurance  POSTURE COMMENTS:  No Significant postural limitations  ACTIVITY TOLERANCE: Activity tolerance: HR and O2 fluctuate with activity  UPPER EXTREMITY ROM:  RUE shoulder flexion 160        LUE shoulder flexion 90   UPPER EXTREMITY MMT:   RUE 4+/5         LUE 4/5 shoulder, 4+/5 overall  HAND FUNCTION: Grip strength: Right: 74 lbs; Left: 69 lbs, Lateral pinch: Right: 16 lbs, Left: 14.5 lbs, and 3 point pinch: Right: 18 lbs, Left: 14 lbs  OBSERVATIONS: HR elevated and O2 sats drop with activity, requires frequent rest breaks.   TODAY'S TREATMENT:                                                           08/07/22 Therapeutic exercises: Shoulder flexion with LUE placed on cabinet shelf, incorporating squats to challenge balance while increasing shoulder flexion. Internal and external rotation with elbow at 90*, bicep curls, and PNF diagonal with yellow theraband.  Completed 10x2 with hold for 3-5 seconds at end range.  OT educated on modifications to exercises to compensate for decreased shoulder flexion on L.  OT provided demonstration and min verbal cues for technique to facilitate carryover.  Educated on completing bilaterally with resistance for strengthening.   PATIENT EDUCATION: Education details: Educated on theraband HEP for LUE and RUE strengthening. Person educated: Patient Education method:  Explanation Education comprehension: verbalized understanding  HOME EXERCISE PROGRAM: Access Code: [email or web address] URL: [email or web address] Date: 08/07/2022 Prepared by: [facility]  Exercises - Seated Single Arm Elbow Flexion with Resistance  - 2 sets - 10 reps - Standing Shoulder Single Arm PNF D2 Flexion with Resistance  - 2 sets - 10 reps  GOALS: Goals reviewed with patient? Yes  SHORT TERM GOALS: Target date: 08/26/23  Pt will be independent with HEP for strengthening. Baseline: Goal status: IN PROGRESS  2.  Pt will report able to vacuum entire room without rest break. Baseline:  Goal status: IN PROGRESS  3.  Pt will verbalize understanding of energy conservation strategies and report 2 being utilized at home. Baseline:  Goal status: IN PROGRESS   LONG TERM GOALS: Target date: 09/15/22  Pt will be independent with advanced HEP for strengthening to allow for resumption of leisure tasks (Yard work and fishing).  Baseline:  Goal status: IN PROGRESS  2.  Pt will report able to vacuum 2-3 entire rooms without rest break. Baseline:  Goal status: IN PROGRESS  3.  Pt will demonstrate and/or report increased BUE strength as needed to start up leaf blower. Baseline:  Goal status: IN PROGRESS  4.  Pt will complete 15 mins moderate challenging dynamic standing activity without significant change in HR or O2 sats. Baseline:  Goal status: IN PROGRESS  ASSESSMENT:  CLINICAL IMPRESSION: Pt present for first treatment session s/p initial evaluation.  OT reviewed POC and goals with pt in agreement.  OT modified from red theraband down to yellow to facilitate improved technique.  Pt with improved control and repetitions with yellow theraband.  PERFORMANCE DEFICITS: in functional skills including IADLs, ROM, strength, flexibility, balance, endurance, and cardiopulmonary status limiting function and psychosocial skills including  environmental adaptation and routines and behaviors.   IMPAIRMENTS: are limiting patient from IADLs and leisure.   CO-MORBIDITIES: may have co-morbidities  that affects occupational performance. Patient will benefit from skilled OT to address above impairments and improve overall function.  MODIFICATION OR ASSISTANCE TO COMPLETE EVALUATION: No modification of tasks or assist necessary to complete an evaluation.  OT OCCUPATIONAL PROFILE AND HISTORY: Detailed assessment: Review of records and additional review of physical, cognitive, psychosocial history related to current functional performance.  CLINICAL DECISION MAKING: LOW - limited treatment options, no task modification necessary  REHAB POTENTIAL: Good  EVALUATION COMPLEXITY: Low    PLAN:  OT FREQUENCY: 1x/week  OT DURATION: 6 weeks  PLANNED INTERVENTIONS: self care/ADL training, therapeutic exercise, therapeutic activity, balance training, functional mobility training, ultrasound, moist heat, cryotherapy, patient/family education, energy conservation, coping strategies training, and DME and/or AE instructions  RECOMMENDED OTHER SERVICES: NA  CONSULTED AND AGREED WITH PLAN OF CARE: Patient  PLAN FOR NEXT SESSION: Review strengthening HEP with theraband and add weights as appropriate.   Simonne Come, OTR/L 08/07/2022, 4:23 PM

## 2022-08-07 NOTE — Telephone Encounter (Signed)
Hello Dr. Harrington Challenger,  I am copying Dr. Garald Braver as he is scheduled to see Mr. Hymes tomorrow.   I am seeing Mr. Haub in [place] s/p hospitalization for aspiration pneumonia. The patient was slightly tachycardic at start of session (111 bpm) which improved to 107 bpm with rest. We usually do not treat if HR is >100 bpm at rest. Do you advise we continue to hold off on sessions if HR remains elevated?   Also FYI- Patient mentions that blood glucose dropped to 24 mg/dL over the weekend and he became symptomatic. Appears to have normalized today.   Thanks!  Janene Harvey, PT, DPT 08/07/22 4:43 PM  [facility] at [facility] [address] [place], [place], [place] [address] Phone # [phone] Fax # [phone]

## 2022-08-08 ENCOUNTER — Encounter: Payer: Self-pay | Admitting: Pulmonary Disease

## 2022-08-08 ENCOUNTER — Other Ambulatory Visit (HOSPITAL_COMMUNITY): Payer: Self-pay

## 2022-08-08 ENCOUNTER — Ambulatory Visit (INDEPENDENT_AMBULATORY_CARE_PROVIDER_SITE_OTHER): Payer: 59 | Admitting: Pulmonary Disease

## 2022-08-08 ENCOUNTER — Ambulatory Visit (INDEPENDENT_AMBULATORY_CARE_PROVIDER_SITE_OTHER): Payer: 59

## 2022-08-08 VITALS — BP 126/70 | HR 113 | Ht 74.0 in | Wt 177.2 lb

## 2022-08-08 DIAGNOSIS — J690 Pneumonitis due to inhalation of food and vomit: Secondary | ICD-10-CM | POA: Diagnosis not present

## 2022-08-08 DIAGNOSIS — J939 Pneumothorax, unspecified: Secondary | ICD-10-CM | POA: Diagnosis not present

## 2022-08-08 DIAGNOSIS — J90 Pleural effusion, not elsewhere classified: Secondary | ICD-10-CM

## 2022-08-08 MED ORDER — GLUCOSE BLOOD VI STRP
ORAL_STRIP | 4 refills | Status: AC
  Filled 2022-08-08: qty 100, 50d supply, fill #0

## 2022-08-08 NOTE — Patient Instructions (Signed)
The chest x-ray looks improved  Follow-up as needed

## 2022-08-09 DIAGNOSIS — E162 Hypoglycemia, unspecified: Secondary | ICD-10-CM | POA: Diagnosis not present

## 2022-08-09 DIAGNOSIS — E7800 Pure hypercholesterolemia, unspecified: Secondary | ICD-10-CM | POA: Diagnosis not present

## 2022-08-09 DIAGNOSIS — R000 Tachycardia, unspecified: Secondary | ICD-10-CM | POA: Diagnosis not present

## 2022-08-09 DIAGNOSIS — Z794 Long term (current) use of insulin: Secondary | ICD-10-CM | POA: Diagnosis not present

## 2022-08-09 DIAGNOSIS — E032 Hypothyroidism due to medicaments and other exogenous substances: Secondary | ICD-10-CM | POA: Diagnosis not present

## 2022-08-09 DIAGNOSIS — D473 Essential (hemorrhagic) thrombocythemia: Secondary | ICD-10-CM | POA: Diagnosis not present

## 2022-08-09 DIAGNOSIS — I10 Essential (primary) hypertension: Secondary | ICD-10-CM | POA: Diagnosis not present

## 2022-08-09 DIAGNOSIS — Z6823 Body mass index (BMI) 23.0-23.9, adult: Secondary | ICD-10-CM | POA: Diagnosis not present

## 2022-08-09 DIAGNOSIS — E1169 Type 2 diabetes mellitus with other specified complication: Secondary | ICD-10-CM | POA: Diagnosis not present

## 2022-08-11 ENCOUNTER — Ambulatory Visit: Payer: 59 | Admitting: Physical Therapy

## 2022-08-11 ENCOUNTER — Ambulatory Visit: Payer: 59

## 2022-08-11 ENCOUNTER — Encounter: Payer: Self-pay | Admitting: Physical Therapy

## 2022-08-11 DIAGNOSIS — M6281 Muscle weakness (generalized): Secondary | ICD-10-CM | POA: Diagnosis not present

## 2022-08-11 DIAGNOSIS — R2689 Other abnormalities of gait and mobility: Secondary | ICD-10-CM | POA: Diagnosis not present

## 2022-08-11 DIAGNOSIS — R262 Difficulty in walking, not elsewhere classified: Secondary | ICD-10-CM

## 2022-08-11 DIAGNOSIS — R1313 Dysphagia, pharyngeal phase: Secondary | ICD-10-CM

## 2022-08-11 NOTE — Patient Instructions (Signed)
   Signs of Aspiration Pneumonia   Chest pain/tightness Fever (can be low grade) Cough  With foul-smelling phlegm (sputum) With sputum containing pus or blood With greenish sputum Fatigue  Shortness of breath  Wheezing   **IF YOU HAVE THESE SIGNS, CONTACT YOUR DOCTOR OR GO TO THE EMERGENCY DEPARTMENT OR URGENT CARE AS SOON AS POSSIBLE**     

## 2022-08-11 NOTE — Therapy (Signed)
OUTPATIENT SPEECH LANGUAGE PATHOLOGY TREAMENT   Patient Name: [name] MRN: [id number] DOB:1955/11/12, 66 y.o., male Today's Date: 08/11/2022  PCP: [name], MD REFERRING PROVIDER: [name], MD (referring),  [name], MD (documentation)  END OF SESSION:  End of Session - 08/11/22 1007     Visit Number 2    Number of Visits 17    Date for SLP Re-Evaluation 10/11/21    SLP Start Time 0933    SLP Stop Time  1010    SLP Time Calculation (min) 37 min    Activity Tolerance Patient tolerated treatment well              Past Medical History:  Diagnosis Date   Alopecia    s/p chemotherapy   Arthritis    Bursitis    pt. denies   Cancer ([place]) 10/2007    tongue/squamous cell,stg IV,HPV positive   Diabetes mellitus without complication ([place])    takes Amaryl,Januvia,and Metformin,daily   DVT, lower extremity ([place]) 2009   side effect from chemo and has blood clot left leg-wears compression stockings;was on Coumadin for 80month and has been off 5 yrs   History of chemotherapy    Cisplatin/Taxotere,5-FU   History of radiation therapy 01/28/08-03/11/08   left base tongue   History of shingles    Hyperlipidemia    takes Vytorin daily   Hypothyroidism    takes Synthroid daily   Joint pain    Joint swelling    Neuropathy    left foot   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 denies   Right kidney mass    Seborrheic keratosis    multiple on back   Past Surgical History:  Procedure Laterality Date   BIOPSY  07/07/2022   Procedure: BIOPSY;  Surgeon: VLoney Laurence DO;  [facility]  Service: Gastroenterology;;   CARDIAC CATHETERIZATION     CHEST TUBE INSERTION  07/06/2022   Procedure: CHEST TUBE INSERTION;  Surgeon: BCollene Gobble MD;  [facility];  Service: Cardiopulmonary;;   ESOPHAGOGASTRODUODENOSCOPY (EGD) WITH PROPOFOL N/A 07/07/2022    Procedure: ESOPHAGOGASTRODUODENOSCOPY (EGD) WITH PROPOFOL;  Surgeon: VLoney Laurence DO;  [facility]  Service: Gastroenterology;  Laterality: N/A;   feeding tube placed  2009 and then removed   [place] 08/05/2020   Procedure: LYMPH NODE DISSECTION;  Surgeon: BRaynelle Bring MD;  Location: [facility];  Service: Urology;  Laterality: Right;   MOUTH SURGERY     PILONIDAL CYST / SINUS EXCISION  1974   port a cath placement     PORT-A-CATH REMOVAL  2010   right wrist fracture Right 2004   ROBOT ASSISTED LAPAROSCOPIC NEPHRECTOMY Right 08/05/2020   Procedure: XI ROBOTIC ASSISTED LAPAROSCOPIC RADICAL NEPHRECTOMY/PARTIAL ADRENALECTOMY;  Surgeon: BRaynelle Bring MD;  [facility];  Service: Urology;  Laterality: Right;   SHOULDER ARTHROSCOPY WITH DISTAL CLAVICLE RESECTION Right 10/26/2016   Procedure: SHOULDER ARTHROSCOPY WITH DISTAL CLAVICLE RESECTION;  Surgeon: KJustice Britain MD;  Location[facility]  Service: Orthopedics;  Laterality: Right;   SHOULDER ARTHROSCOPY WITH ROTATOR CUFF REPAIR AND SUBACROMIAL DECOMPRESSION Left 07/16/2014   Procedure: LEFT SHOULDER ARTHROSCOPY WITH ROTATOR CUFF REPAIR AND SUBACROMIAL DECOMPRESSION/DISTAL CLAVICLE RESECTION;  Surgeon: KMarin Shutter MD;  [facility]  Service: Orthopedics;  Laterality: Left;   SHOULDER ARTHROSCOPY WITH ROTATOR CUFF REPAIR AND SUBACROMIAL  DECOMPRESSION Right 10/26/2016   Procedure: RIGHT SHOULDER ARTHROSCOPY WITH ROTATOR CUFF REPAIR AND SUBACROMIAL DECOMPRESSION;  Surgeon: Justice Britain, MD;  Location: [facility];  Service: Orthopedics;  Laterality: Right;  requests 2hrs   teeth extraction #9     Patient Active Problem List   Diagnosis Date Noted   Acute respiratory failure with hypoxia ([place]) 07/11/2022   Leukocytosis 07/11/2022   Dysphagia 07/11/2022   Chronic kidney disease (CKD), stage III (moderate) (HCC) 07/11/2022   Anemia of chronic disease 07/11/2022   Thrombocytosis  07/11/2022   Pleural effusion 07/06/2022   Aspiration pneumonia ([place]) 07/05/2022   AKI (acute kidney injury) ([place]) 08/04/2021   History of DVT (deep vein thrombosis) 08/04/2021   History of pulmonary embolus (PE) 75/17/0017   Acute metabolic acidosis 49/44/9675   DKA (diabetic ketoacidosis) ([place]) 08/03/2021   Elevated serum creatinine 04/06/2021   Renal cell carcinoma, right ([place]) 08/05/2020   Thyroid activity decreased    Acute pulmonary embolism ([place]) 12/07/2014   Diabetes mellitus type 2 in nonobese ([place]) 12/07/2014   Cancer ([place])    History of angioplasty    Depression    DVT, lower extremity ([place])    Osteoradionecrosis of mandible    Malignant neoplasm of base of tongue ([place]) 07/20/2011   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/20/2011   Pulmonary embolism ([place]) 07/20/2011    ONSET DATE: 2009 after head/neck radiation, script dated 07-14-22   REFERRING DIAG: J18.9 (ICD-10-CM) - Community acquired pneumonia of left lung, unspecified part of lung   THERAPY DIAG:  Dysphagia, pharyngeal phase  Rationale for Evaluation and Treatment: Rehabilitation  SUBJECTIVE:   SUBJECTIVE STATEMENT: "They said my pulse rate was high last time and I went to my doctor - he said 'do it.' " Pt accompanied by: self  PERTINENT HISTORY: 66 y.o. male presented with worsening shortness of breath and cough. Chest CT 11/1: "Interval worsening of diffuse bronchial wall thickening with  right lower lobe and right middle lobe development of atypical  infection." OP MBS 05/12/2022 with silent aspiration of all consistencies of liquid.  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  Visited ENT on 03/08/2022 with history as follows: Pt "had base of tongue cancer in 2009 treated with radiation and chemotherapy with cure. He had kidney cancer in 2021. He has noticed more hoarseness with more talking over the past six months. Bulkier foods do not pass very well with swallowing. He coughs a lot when eating. He is able to swallow liquids fine." Pt has a history of left vocal cord paralysis (decreased movmenet of left VF compared to right, good glottic closure, no muscle tension patterns, laryngeal edema is minimal on fiberoptic exam) and tongue cancer. CT on 03/23/22 shows no mass, asymmetric glottic consistent with history of paralysis. No mass on reccurent laryngeal nerve. Pt underwent esophagram on 03/15/22, ordered by Dr Redmond Baseman due to dysphagia. Pt aspirated thick barium and study was terminated. MBS recommended by radiologist.  PAIN:  Are you having pain? No  FALLS: Has patient fallen in last 6 months?  No  LIVING ENVIRONMENT: Lives with: lives with their spouse Lives in: House/apartment  PLOF:  Level of assistance: Independent with ADLs Employment: Retired  PATIENT GOALS: Improve swallowing  OBJECTIVE:  DIAGNOSTIC FINDINGS:  DG CHEST 07/05/22  IMPRESSION: There is interval appearance of large area of opacification in the left mid and left lower lung fields. Findings suggest pneumonia and large effusion. Part of the effusion appears to be loculated along the lateral chest wall. Possibility of empyema is not excluded. Follow-up CT may be considered.     RECOMMENDATIONS FROM OBJECTIVE SWALLOW STUDY (MBSS/FEES):   Objective swallow impairments: Pt demonstrates an oropharyngeal dysphagia characterized by decreased mobility across pharyngeal structures that leads to mild silent aspiration of liquids, regardless of texture. Pt can achieve adequate glottic closure at the height of the swallow, but epiglottic deflection, reduced laryngeal elevation and reduced UES  opening lead to penetration and residue that falls into the airway just as glottis opens. SLP attempted a variety of compenstory strategies including head turn left, head tilt right, chin tuck, supraglottic swallow. None prevented aspiration. A super-superglotttic swallow may be beneficial, mostly because pt can eject most aspirate with a cued cough and throat clear. With solids there is mild to moderate residue in pyriforms and valleculae due to the the same weakness. Pt usually uses a liquid wash to clear. This is likely the cause of pts globus sensation. A chin tuck with solids did clear a bit more of the stasis than an upright swallow. Effortful swallow not effective. Provided visual feedback to pt and provided extensive education and recommendations post session. Would not recommend any texture modification as there is no beenfit to thickened liquids for this pt. Futhermore, pt has not had any recent repiratory infection that would warrant excessive precaution. However, recommended pt: 1. trial a chin tuck with solids, 2. cough and clear throat frequently with intake even if he doesnt feel the need to do so, 3. complete thorough oral care to reduce bacterial load with aspiration, 4. inform providers of dysphagia and 5. consider f/u with SLP for home exercise program. 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 Exercise and tx targeting pharyngeal musculature could be helpful. Objective recommended compensations:  PRECAUTIONS: Slow rate;Small sips/bites;Follow solids with liquid;Clear throat intermittently;Clear throat after each swallow;Chin tuck BEDSIDE SWALLOW EVAL RESULTS NOVEMBER 2023: Clinical Impression  Pt presents with clinical indicators of pharyngeal dysphagia with known hx of chronic, silent aspiration s/p rad tx for oropharyngeal CA 2009.  Pt completed OP MBSS on 05/12/2022 revealing "oropharyngeal dysphagia  characterized by decreased mobility across pharyngeal structures that leads to mild silent aspiration of liquids, regardless of texture." Recommendations at that time to continue regular diet with thin liquids as modified consistencies were of no benefit.  Aspiration precaution recommendations from MBS as follows: " 1. trial a chin tuck with solids, 2. cough and clear throat frequently with intake even if he doesnt feel the need to do so, 3. complete thorough oral care to reduce bacterial load with aspiration, 4. inform providers of dysphagia and 5. consider f/u with SLP for home exercise program."  These were reviewed with pt.  Pt reports that he did not have any speech therapy follow up after this assessment, but he is agreeable to trial of swallow exercise program in house.  Pt will likely require 6-8 weeks of intensive swallow exercise program to effect change in swallow function and should follow with OP SLP for continued dysphagia management.  Pt with hx VF paralysis.  This was never directly addressed.  Pt's vocal quality is strong and clear in conversation. Today pt exhibited intermittent throat clear  with thin liquid and regular solids.  On palpation, laryngeal elevation was reduced.  Pt achieved greater hyolaryngeal excursion with a sip of water than with voluntary swallow without bolus trial (dry swallow).  Pt notes that soft foods (e.g. applesauce) are easier to eat for him.  There were no clinical s/s of puree.  Even in the absence of clinical s/s of aspiration, there is still a risk for prandial aspiration given hx of silent aspiration of liquid.  Pt would like to resume regular texture diet with thin liquid. He does still c/o of feeling of stasis with solid textures regularly and may benefit from further assessment and possible treatment for esophageal narrowing.  He states he would like to address the feelings of stasis with PO intake.  Consider GI consult if pt is a candidate for espohageal  dilation.  Pt likely does not need a repeat instrumental swallow evaluation at this time.  It is unlikely that swallow function has changed since September, and pt would like to continue oral diet at this time.   Diet Recommendation Regular;Thin liquid  Liquid Administration via: Cup;Straw Medication Administration:  (As tolerated) Supervision: Patient able to self feed  Compensations:  Slow rate; Small sips/bites; Chin tuck with solids; Follow solids with liquid; Clear throat regularly   PATIENT REPORTED OUTCOME MEASURES (PROM): EAT-10: administered today and pt scored 23/30 (lower scores indicate greater QOL from swallowing). "4" was answered for "I cough when I eat", and "swallowing is stressful".   TODAY'S TREATMENT:                                                                                                                                         DATE:   08/11/22: Pt without hydrophonic voice today upon entry to [place]. Pt req'd cues for super-supraglottic, but was indpendent with other 2 exercises. SLP provided pt with overt s/sx aspiration PNA - pt told SLP 3 overt s/sx. With POs, pt followed precautions 100%.   08/02/22: SLP reviewed MBS with pt from September and precautions with POs. Pt req'd cues for alternating bite/sip and clear throat intermittently (SLP encouraged pt to clear throat and reswallow after every bite of solids). By session end pt was modified independent with precautions (handout from SLP being used). SLP then educated pt about three swallow exercises: effortful swallow, Mendelsohn, and Super-supraglottic. Pt was mod A usually, faded to modified independent with all exercises. He was told he will need to do these until 10-05-22 and then another MBS will likely be recommended.   PATIENT EDUCATION: Education details: see "today's treatment" Person educated: Patient Education method: Explanation, Demonstration, Verbal cues, and Handouts Education comprehension:  verbalized understanding, returned demonstration, verbal cues required, and needs further education   ASSESSMENT:  CLINICAL IMPRESSION: Patient is a 66 y.o. male who was seen today for treatment of swallowing precautions and of HEP. Today pt completed HEP with min A  with super-supraglottic. Overt s/sx aspiration PNA were provided today. SEE TX NOTE for more details. Pt reports his recent GI exam revealed he did NOT require esophageal dilitation/dilation.    OBJECTIVE IMPAIRMENTS: include dysphagia. These impairments are limiting patient from safety when swallowing. Factors affecting potential to achieve goals and functional outcome are previous level of function and severity of impairments. Patient will benefit from skilled SLP services to address above impairments and improve overall function.  REHAB POTENTIAL: Fair given above factors   GOALS: Goals reviewed with patient? Yes  SHORT TERM GOALS: Target date: 09-10-22  Pt will complete HEP with rare min A in two sessions Baseline: 08-11-22 Goal status: ongoing  2.  Pt will follow aspiration precautions from [place] 05-12-22 with rare min A in 2 sessions Baseline: 08-11-22 Goal status: ongoing  3.  Pt will tell SLP 3 overt s/sx of aspiration PNA with modified independence Baseline:  Goal status: met  4.  Pt will complete PROM in first two therapy sessions Baseline:  Goal status: met    LONG TERM GOALS: Target date: 10-11-22  Pt will follow aspiration precautions from [facility] 05-12-22 with modified independence in 2 sessions Baseline: 08-11-22 Goal status: ongoing  2.  Pt will complete HEP with modified independence n two sessions Baseline:  Goal status: ongoing  3.  Pt will achieve improvement in measurement of PROM in last 1-2 sessions Baseline:  Goal status: ongoing  4.  Pt will undergo follow up MBS to assess progress Baseline:  Goal status: ongoing   PLAN:  SLP FREQUENCY: 2x/week  SLP DURATION:  17 sessions until  10-11-21  PLANNED INTERVENTIONS: Aspiration precaution training, Pharyngeal strengthening exercises, Diet toleration management , Environmental controls, Trials of upgraded texture/liquids, Internal/external aids, SLP instruction and feedback, Compensatory strategies, and Patient/family education    [facility], [place] 08/11/2022, 10:08 AM

## 2022-08-11 NOTE — Therapy (Signed)
OUTPATIENT PHYSICAL THERAPY NEURO TREATMENT   Patient Name: [name] MRN: [id number] DOB:03/11/1956, 66 y.o., male Today's Date: 08/11/2022   PCP: [name], MD  REFERRING PROVIDER: [name], MD  END OF SESSION:  PT End of Session - 08/11/22 1059     Visit Number 2    Number of Visits 13    Date for PT Re-Evaluation 09/13/22    Authorization Type [facility]    Authorization - Number of Visits 25    PT Start Time 1018    PT Stop Time 1057    PT Time Calculation (min) 39 min    Equipment Utilized During Treatment Gait belt    Activity Tolerance Patient tolerated treatment well;Patient limited by fatigue    Behavior During Therapy WFL for tasks assessed/performed              Past Medical History:  Diagnosis Date   Alopecia    s/p chemotherapy   Arthritis    Bursitis    pt. 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,stg IV,HPV positive   Diabetes mellitus without complication ([place])    takes Amaryl,Januvia,and Metformin,daily   DVT, lower extremity ([place]) 2009   side effect from chemo and has blood clot left leg-wears compression stockings;was on Coumadin for 8months and has been off 5 yrs   History of chemotherapy    Cisplatin/Taxotere,5-FU   History of radiation therapy 01/28/08-03/11/08   left base tongue   History of shingles    Hyperlipidemia    takes Vytorin daily   Hypothyroidism    takes Synthroid daily   Joint pain    Joint swelling    Neuropathy    left foot   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 denies   Right kidney mass    Seborrheic keratosis    multiple on back   Past Surgical History:  Procedure Laterality Date   BIOPSY  07/07/2022   Procedure: BIOPSY;  Surgeon: Loney Laurence, DO;  Location: [facility];  Service: Gastroenterology;;   CARDIAC CATHETERIZATION     CHEST TUBE INSERTION  07/06/2022   Procedure: CHEST TUBE  INSERTION;  Surgeon: Collene Gobble, MD;  Location: [facility];  Service: Cardiopulmonary;;   ESOPHAGOGASTRODUODENOSCOPY (EGD) WITH PROPOFOL N/A 07/07/2022   Procedure: ESOPHAGOGASTRODUODENOSCOPY (EGD) WITH PROPOFOL;  Surgeon: Loney Laurence, DO;  Location: [facility];  Service: Gastroenterology;  Laterality: N/A;   feeding tube placed  2009 and then removed   [place] Right 08/05/2020   Procedure: LYMPH NODE DISSECTION;  Surgeon: Raynelle Bring, MD;  Location: [facility];  Service: Urology;  Laterality: Right;   MOUTH SURGERY     PILONIDAL CYST / SINUS EXCISION  1974   port a cath placement     PORT-A-CATH REMOVAL  2010   right wrist fracture Right 2004   ROBOT ASSISTED LAPAROSCOPIC NEPHRECTOMY Right 08/05/2020   Procedure: XI ROBOTIC ASSISTED LAPAROSCOPIC RADICAL NEPHRECTOMY/PARTIAL ADRENALECTOMY;  Surgeon: Raynelle Bring, MD;  Location: [facility];  Service: Urology;  Laterality: Right;   SHOULDER ARTHROSCOPY WITH DISTAL CLAVICLE RESECTION Right 10/26/2016   Procedure: SHOULDER ARTHROSCOPY WITH DISTAL CLAVICLE RESECTION;  Surgeon: Justice Britain, MD;  Location: [facility];  Service: Orthopedics;  Laterality: Right;   SHOULDER ARTHROSCOPY WITH ROTATOR CUFF REPAIR AND SUBACROMIAL DECOMPRESSION Left 07/16/2014   Procedure: LEFT SHOULDER ARTHROSCOPY WITH ROTATOR CUFF  REPAIR AND SUBACROMIAL DECOMPRESSION/DISTAL CLAVICLE RESECTION;  Surgeon: Marin Shutter, MD;  Location: [facility];  Service: Orthopedics;  Laterality: Left;   SHOULDER ARTHROSCOPY WITH ROTATOR CUFF REPAIR AND SUBACROMIAL DECOMPRESSION Right 10/26/2016   Procedure: RIGHT SHOULDER ARTHROSCOPY WITH ROTATOR CUFF REPAIR AND SUBACROMIAL DECOMPRESSION;  Surgeon: Justice Britain, MD;  Location: [facility];  Service: Orthopedics;  Laterality: Right;  requests 2hrs   teeth extraction #9     Patient Active Problem List   Diagnosis Date Noted   Acute respiratory failure with hypoxia ([place]) 07/11/2022   Leukocytosis  07/11/2022   Dysphagia 07/11/2022   Chronic kidney disease (CKD), stage III (moderate) (HCC) 07/11/2022   Anemia of chronic disease 07/11/2022   Thrombocytosis 07/11/2022   Pleural effusion 07/06/2022   Aspiration pneumonia ([place]) 07/05/2022   AKI (acute kidney injury) ([place]) 08/04/2021   History of DVT (deep vein thrombosis) 08/04/2021   History of pulmonary embolus (PE) 08/67/6195   Acute metabolic acidosis 09/32/6712   DKA (diabetic ketoacidosis) ([place]) 08/03/2021   Elevated serum creatinine 04/06/2021   Renal cell carcinoma, right ([place]) 08/05/2020   Thyroid activity decreased    Acute pulmonary embolism ([place]) 12/07/2014   Diabetes mellitus type 2 in nonobese ([place]) 12/07/2014   Cancer ([place])    History of angioplasty    Depression    DVT, lower extremity ([place])    Osteoradionecrosis of mandible    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/20/2011   Pulmonary embolism ([place]) 07/20/2011    ONSET DATE: 07/05/22  REFERRING DIAG: J18.9 (ICD-10-CM) - Community acquired pneumonia of left lung, unspecified part of lung  THERAPY DIAG:  Muscle weakness (generalized)  Difficulty in walking, not elsewhere classified  Other abnormalities of gait and mobility  Rationale for Evaluation and Treatment: Rehabilitation  SUBJECTIVE:                                                                                                                                                                                             SUBJECTIVE STATEMENT: Saw Dr. Harrington Challenger who told me that he told PT to "work me out."    Pt accompanied by: self  PERTINENT HISTORY: DM, HLD, neuropathy, R RTC repair, stage 4 tongue/squamous cell CA, R renal CA  PAIN:  Are you having pain? No  PRECAUTIONS: Other: per patient, aspiration precautions- no bread, no crackers, thin liquids okay Per fax from Dr. Harrington Challenger 08/08/22 "pt tachy all the time  ok to treat if pulse >100."  WEIGHT BEARING RESTRICTIONS: No  FALLS: Has patient fallen in last 6 months?  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  PLOF: Independent; retired; would like to return to hunting, fishing, woodworking   PATIENT GOALS: would like to return to hunting, fishing, woodworking   OBJECTIVE:     TODAY'S TREATMENT: 08/11/22 Activity Comments  Vitals at start of session 96% spO2, 102 bpm  6 minute walk test Pre-test RPE: 0.5 1164 ft Post-test RPE: 2  Vitals post- 6 minute walk 88spO2, 122 bpm which improved to 95% spO2 within a couple seconds  STS pushing off knees and with 1 airex under bottom Adjustments made d/t difficulty/knee discomfort  gastroc stretch 30" Required cueing for proper positioning  standing hip ABD 10x each, then 10x  with red TB reminders to keep posture tall; more fatigue with TB  standing hip EX 10x each, then 10x  with red TB reminders to keep posture tall, knee straight  resisted march with red TB 2x20 Pt reports fatigue/difficulty   alt toe tap on step 30" without UE support 26 reps, 31 reps     HOME EXERCISE PROGRAM Last updated: 08/11/22 Access Code: [email or web address] URL: [email or web address] Date: 08/11/2022 Prepared by: [facility]  Exercises - Sit to Stand with Counter Support  - 1 x daily - 5 x weekly - 3 sets - 10 reps - Gastroc Stretch on Wall  - 1 x daily - 5 x weekly - 2 sets - 30 hold - Marching with Resistance  - 1 x daily - 5 x weekly - 2 sets - 10 reps - Standing Hip Abduction with Resistance at Ankles and Counter Support  - 1 x daily - 5 x weekly - 2 sets - 10 reps - Standing Hip Extension with Resistance at Ankles and Counter Support  - 1 x daily - 5 x weekly - 2 sets - 10 reps   PATIENT EDUCATION: Education  details: HEP update- to be performed at counter top Person educated: Patient Education method: Explanation, Demonstration, Tactile cues, Verbal cues, and Handouts Education comprehension: verbalized understanding and returned demonstration     Below measures were taken at time of initial evaluation unless otherwise specified:   DIAGNOSTIC FINDINGS: 07/24/22 chest xray: Similar to slightly increased loculated left hydropneumothorax. Trace thickening of the right inferolateral pleura may represent trace pleural effusion. Similar asymmetrically low left lung volumes with left basilar patchy opacities, likely atelectasis.  COGNITION: Overall cognitive status: Within functional limits for tasks assessed   SENSATION: Reports a little N/T in B feet from neuropathy  COORDINATION: Alternating pronation/supination: WNL B Alternating toe tap: WNL B Finger to nose: WNL B    POSTURE: rounded shoulders, forward head, and increased thoracic kyphosis  LOWER EXTREMITY ROM:     Active  Right Eval Left Eval  Hip flexion    Hip extension    Hip abduction    Hip adduction    Hip internal rotation    Hip external rotation    Knee flexion    Knee extension    Ankle dorsiflexion 5 5  Ankle plantarflexion    Ankle inversion    Ankle eversion     (Blank rows = not tested)  LOWER EXTREMITY MMT:    MMT (in sitting) Right Eval Left Eval  Hip flexion 4 4-  Hip extension    Hip abduction 4+ 4+  Hip adduction  4- 4  Hip internal rotation    Hip external rotation    Knee flexion 5 4+  Knee extension 5 5  Ankle dorsiflexion 4+ 4+  Ankle plantarflexion 4+ 4+  Ankle inversion    Ankle eversion    (Blank rows = not tested)   GAIT: Gait pattern: Good gait speed and step length but with L lateral trunk lean and thoracic kyphosis  Assistive device utilized: None Level of assistance: Complete Independence  FUNCTIONAL TESTS:        TODAY'S TREATMENT:                                                                                                                               DATE: 08/02/22    PATIENT EDUCATION: Education details: prognosis, POC, HEP, edu on exam findings  Person educated: Patient Education method: Explanation, Demonstration, Tactile cues, Verbal cues, and Handouts Education comprehension: verbalized understanding  HOME EXERCISE PROGRAM: Access Code: [email or web address] URL: [email or web address] Date: 08/02/2022 Prepared by: [facility]  Exercises - Sit to Stand with Counter Support  - 1 x daily - 5 x weekly - 3 sets - 10 reps - Gastroc Stretch on Wall  - 1 x daily - 5 x weekly - 2 sets - 30 hold - Standing Hip Abduction with Counter Support  - 1 x daily - 5 x weekly - 2 sets - 10 reps - Standing Hip Extension with Counter Support  - 1 x daily - 5 x weekly - 2 sets - 10 reps   GOALS: Goals reviewed with patient? Yes  SHORT TERM GOALS: Target date: 08/23/2022  Patient to be independent with initial HEP. Baseline: HEP initiated Goal status: IN PROGRESS    LONG TERM GOALS: Target date: 09/13/2022  Patient to be independent with advanced HEP. Baseline: Not yet initiated  Goal status: IN PROGRESS  Patient to demonstrate B LE strength >/=4+/5.  Baseline: See above Goal status: IN PROGRESS  Patient to demonstrate 10 degrees B ankle dorsiflexion AROM WFL in order to improve efficiency with gait. Baseline: 5 deg B Goal status: IN PROGRESS  Patient to report tolerance for walking for 45 minutes without fatigue limiting.  Baseline: unable Goal status: IN PROGRESS  Patient to demonstrate 5xSTS test without UE support in <15 sec in order to decrease risk of falls.  Baseline: 13 sec with armrests  Goal status: IN PROGRESS  Patient to walk at least 1312 feet during 6 minute walk test in order to score closer to age-matched norms.  Baseline: 1164 ft 08/11/22 Goal status: IN PROGRESS  08/11/22   ASSESSMENT:  CLINICAL IMPRESSION: Patient arrived to session without complaints. Per PCP- patient okay to be seen if resting HR >100bpm. Vitals very slightly tachycardic at start of session, thus proceeded while monitoring symptoms. Patient walked 1164 feet during 6 minute walk test, with only mild increase in fatigue. Good overall stability evident during testing. Reviewed  HEP for max understanding and carryover, providing occasional cueing to correct form. Progressed activities with resisted bands which brought on increased fatigue, requiring occasional sitting rest breaks. Patient reported understanding of all edu provided and without complaints at end of session.     OBJECTIVE IMPAIRMENTS: Abnormal gait, decreased activity tolerance, decreased endurance, decreased ROM, decreased strength, dizziness, impaired flexibility, improper body mechanics, and postural dysfunction.   ACTIVITY LIMITATIONS: carrying, lifting, bending, standing, squatting, stairs, bathing, and dressing  PARTICIPATION LIMITATIONS: meal prep, cleaning, laundry, shopping, community activity, yard work, and church  PERSONAL FACTORS: Age, Fitness, Past/current experiences, Time since onset of injury/illness/exacerbation, and 3+ comorbidities: DM, HLD, neuropathy, R RTC repair, stage 4 tongue/squamous cell CA, R renal CA  are also affecting patient's functional outcome.   REHAB POTENTIAL: Good  CLINICAL DECISION MAKING: Evolving/moderate complexity  EVALUATION COMPLEXITY: Moderate  PLAN:  PT FREQUENCY: 1-2x/week  PT DURATION: 6 weeks  PLANNED INTERVENTIONS: Therapeutic exercises, Therapeutic activity, Neuromuscular re-education, Balance training, Gait training, Patient/Family education, Self Care, Joint mobilization, Stair training, Vestibular training, Canalith repositioning, DME instructions, Aquatic Therapy, Dry Needling, Electrical stimulation, Cryotherapy, Moist heat, Taping, Manual therapy, and  Re-evaluation  PLAN FOR NEXT SESSION: Monitor spO2 during activity; progress hip strengthening and endurance    Janene Harvey, PT, DPT 08/11/22 11:01 AM  [facility] at [facility] [address] [place], [place] [address] Phone # [phone] Fax # [phone]

## 2022-08-13 ENCOUNTER — Other Ambulatory Visit (HOSPITAL_COMMUNITY): Payer: Self-pay

## 2022-08-14 ENCOUNTER — Other Ambulatory Visit (HOSPITAL_COMMUNITY): Payer: Self-pay

## 2022-08-16 NOTE — Therapy (Signed)
OUTPATIENT PHYSICAL THERAPY NEURO TREATMENT   Patient Name: [name] MRN: [id number] DOB:[birth year], 66 y.o., male Today's Date: 08/17/2022   PCP: [name], MD  REFERRING PROVIDER: [name], MD  END OF SESSION:  PT End of Session - 08/17/22 1010     Visit Number 3    Number of Visits 13    Date for PT Re-Evaluation 09/13/22    Authorization Type [facility]    Authorization - Number of Visits 25    PT Start Time 0929    PT Stop Time 1007    PT Time Calculation (min) 38 min    Equipment Utilized During Treatment Gait belt    Activity Tolerance Patient tolerated treatment well;Patient limited by fatigue    Behavior During Therapy WFL for tasks assessed/performed               Past Medical History:  Diagnosis Date   Alopecia    s/p chemotherapy   Arthritis    Bursitis    pt. 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,stg IV,HPV positive   Diabetes mellitus without complication ([place])    takes Amaryl,Januvia,and Metformin,daily   DVT, lower extremity ([place]) 2009   side effect from chemo and has blood clot left leg-wears compression stockings;was on Coumadin for 9months and has been off 5 yrs   History of chemotherapy    Cisplatin/Taxotere,5-FU   History of radiation therapy 01/28/08-03/11/08   left base tongue   History of shingles    Hyperlipidemia    takes Vytorin daily   Hypothyroidism    takes Synthroid daily   Joint pain    Joint swelling    Neuropathy    left foot   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 denies   Right kidney mass    Seborrheic keratosis    multiple on back   Past Surgical History:  Procedure Laterality Date   BIOPSY  07/07/2022   Procedure: BIOPSY;  Surgeon: Loney Laurence, DO;  Location: [facility];  Service: Gastroenterology;;   CARDIAC CATHETERIZATION     CHEST TUBE INSERTION  07/06/2022   Procedure: CHEST TUBE  INSERTION;  Surgeon: Collene Gobble, MD;  Location: [facility];  Service: Cardiopulmonary;;   ESOPHAGOGASTRODUODENOSCOPY (EGD) WITH PROPOFOL N/A 07/07/2022   Procedure: ESOPHAGOGASTRODUODENOSCOPY (EGD) WITH PROPOFOL;  Surgeon: Loney Laurence, DO;  Location: [facility];  Service: Gastroenterology;  Laterality: N/A;   feeding tube placed  2009 and then removed   [place] Right 08/05/2020   Procedure: LYMPH NODE DISSECTION;  Surgeon: Raynelle Bring, MD;  Location: [facility];  Service: Urology;  Laterality: Right;   MOUTH SURGERY     PILONIDAL CYST / SINUS EXCISION  1974   port a cath placement     PORT-A-CATH REMOVAL  2010   right wrist fracture Right 2004   ROBOT ASSISTED LAPAROSCOPIC NEPHRECTOMY Right 08/05/2020   Procedure: XI ROBOTIC ASSISTED LAPAROSCOPIC RADICAL NEPHRECTOMY/PARTIAL ADRENALECTOMY;  Surgeon: Raynelle Bring, MD;  Location: [facility];  Service: Urology;  Laterality: Right;   SHOULDER ARTHROSCOPY WITH DISTAL CLAVICLE RESECTION Right 10/26/2016   Procedure: SHOULDER ARTHROSCOPY WITH DISTAL CLAVICLE RESECTION;  Surgeon: Justice Britain, MD;  Location: [facility];  Service: Orthopedics;  Laterality: Right;   SHOULDER ARTHROSCOPY WITH ROTATOR CUFF REPAIR AND SUBACROMIAL DECOMPRESSION Left 07/16/2014   Procedure: LEFT SHOULDER ARTHROSCOPY WITH ROTATOR  CUFF REPAIR AND SUBACROMIAL DECOMPRESSION/DISTAL CLAVICLE RESECTION;  Surgeon: Marin Shutter, MD;  Location: [facility];  Service: Orthopedics;  Laterality: Left;   SHOULDER ARTHROSCOPY WITH ROTATOR CUFF REPAIR AND SUBACROMIAL DECOMPRESSION Right 10/26/2016   Procedure: RIGHT SHOULDER ARTHROSCOPY WITH ROTATOR CUFF REPAIR AND SUBACROMIAL DECOMPRESSION;  Surgeon: Justice Britain, MD;  Location: [facility];  Service: Orthopedics;  Laterality: Right;  requests 2hrs   teeth extraction #9     Patient Active Problem List   Diagnosis Date Noted   Acute respiratory failure with hypoxia ([place]) 07/11/2022   Leukocytosis  07/11/2022   Dysphagia 07/11/2022   Chronic kidney disease (CKD), stage III (moderate) (HCC) 07/11/2022   Anemia of chronic disease 07/11/2022   Thrombocytosis 07/11/2022   Pleural effusion 07/06/2022   Aspiration pneumonia ([place]) 07/05/2022   AKI (acute kidney injury) ([place]) 08/04/2021   History of DVT (deep vein thrombosis) 08/04/2021   History of pulmonary embolus (PE) 46/27/0350   Acute metabolic acidosis 09/38/1829   DKA (diabetic ketoacidosis) ([place]) 08/03/2021   Elevated serum creatinine 04/06/2021   Renal cell carcinoma, right ([place]) 08/05/2020   Thyroid activity decreased    Acute pulmonary embolism ([place]) 12/07/2014   Diabetes mellitus type 2 in nonobese ([place]) 12/07/2014   Cancer ([place])    History of angioplasty    Depression    DVT, lower extremity ([place])    Osteoradionecrosis of mandible    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/20/2011   Pulmonary embolism ([place]) 07/20/2011    ONSET DATE: 07/05/22  REFERRING DIAG: J18.9 (ICD-10-CM) - Community acquired pneumonia of left lung, unspecified part of lung  THERAPY DIAG:  Muscle weakness (generalized)  Difficulty in walking, not elsewhere classified  Other abnormalities of gait and mobility  Rationale for Evaluation and Treatment: Rehabilitation  SUBJECTIVE:                                                                                                                                                                                             SUBJECTIVE STATEMENT: "Not a thing" new.  Pt accompanied by: self  PERTINENT HISTORY: DM, HLD, neuropathy, R RTC repair, stage 4 tongue/squamous cell CA, R renal CA  PAIN:  Are you having pain? No  PRECAUTIONS: Other: per patient, aspiration precautions- no bread, no crackers, thin liquids okay Per fax from Dr. Harrington Challenger 08/08/22 "pt tachy all the time ok to treat if pulse >100."  WEIGHT  BEARING RESTRICTIONS: No  FALLS: Has patient fallen in last 6 months? No  LIVING ENVIRONMENT: Lives with: lives with their spouse Lives  in: House/apartment Stairs:  3 steps to enter without handrail; 14 steps to 2nd floor  Has following equipment at home: Shower bench  PLOF: Independent; retired; would like to return to hunting, fishing, [place]: would like to return to hunting, fishing, woodworking   OBJECTIVE:     TODAY'S TREATMENT: 08/17/22 Activity Comments  Vitals at start of session  95% spO2, 98 bpm  Nustep L5 x 7 min Les only Maintaining ~70-90 SPM At 3.5 min: 97% spO2, 124 bpm Rest break at 5 min: 98% spO2, 120 bpm   STS sitting on airex 10x, 10x with green medball  Without Ues; limited eccentric control  alt toe tap on step #2 ankle weights 30" without UE support  34 reps; 30 reps, 33 reps  step up + opposite SKTC 10x each LE Light B UE support required for balance; CGA  sidestepping  yellow TB 4x16ft  CGA; rest breaks in between; 95% 119bpm; good long steps, cueing for taller posture        HOME EXERCISE PROGRAM Last updated: 08/11/22 Access Code: [email or web address] URL: [email or web address] Date: 08/11/2022 Prepared by: [facility]  Exercises - Sit to Stand with Counter Support  - 1 x daily - 5 x weekly - 3 sets - 10 reps - Gastroc Stretch on Wall  - 1 x daily - 5 x weekly - 2 sets - 30 hold - Marching with Resistance  - 1 x daily - 5 x weekly - 2 sets - 10 reps - Standing Hip Abduction with Resistance at Ankles and Counter Support  - 1 x daily - 5 x weekly - 2 sets - 10 reps - Standing Hip Extension with Resistance at Ankles and Counter Support  - 1 x daily - 5 x weekly - 2 sets - 10 reps   Below measures were taken at time of initial evaluation unless otherwise specified:   DIAGNOSTIC FINDINGS: 07/24/22 chest xray: Similar to slightly increased loculated left hydropneumothorax. Trace  thickening of the right inferolateral pleura may represent trace pleural effusion. Similar asymmetrically low left lung volumes with left basilar patchy opacities, likely atelectasis.  COGNITION: Overall cognitive status: Within functional limits for tasks assessed   SENSATION: Reports a little N/T in B feet from neuropathy  COORDINATION: Alternating pronation/supination: WNL B Alternating toe tap: WNL B Finger to nose: WNL B    POSTURE: rounded shoulders, forward head, and increased thoracic kyphosis  LOWER EXTREMITY ROM:     Active  Right Eval Left Eval  Hip flexion    Hip extension    Hip abduction    Hip adduction    Hip internal rotation    Hip external rotation    Knee flexion    Knee extension    Ankle dorsiflexion 5 5  Ankle plantarflexion    Ankle inversion    Ankle eversion     (Blank rows = not tested)  LOWER EXTREMITY MMT:    MMT (in sitting) Right Eval Left Eval  Hip flexion 4 4-  Hip extension    Hip abduction 4+ 4+  Hip adduction 4- 4  Hip internal rotation    Hip external rotation    Knee flexion 5 4+  Knee extension 5 5  Ankle dorsiflexion 4+ 4+  Ankle plantarflexion 4+ 4+  Ankle inversion    Ankle eversion    (Blank rows = not tested)   GAIT: Gait pattern: Good gait speed and step length but  with L lateral trunk lean and thoracic kyphosis  Assistive device utilized: None Level of assistance: Complete Independence  FUNCTIONAL TESTS:        TODAY'S TREATMENT:                                                                                                                              DATE: 08/02/22    PATIENT EDUCATION: Education details: prognosis, POC, HEP, edu on exam findings  Person educated: Patient Education method: Explanation, Demonstration, Tactile cues, Verbal cues, and Handouts Education comprehension: verbalized understanding  HOME EXERCISE PROGRAM: Access Code: [email or web address] URL:  [email or web address] Date: 08/02/2022 Prepared by: [facility]  Exercises - Sit to Stand with Counter Support  - 1 x daily - 5 x weekly - 3 sets - 10 reps - Gastroc Stretch on Wall  - 1 x daily - 5 x weekly - 2 sets - 30 hold - Standing Hip Abduction with Counter Support  - 1 x daily - 5 x weekly - 2 sets - 10 reps - Standing Hip Extension with Counter Support  - 1 x daily - 5 x weekly - 2 sets - 10 reps   GOALS: Goals reviewed with patient? Yes  SHORT TERM GOALS: Target date: 08/23/2022  Patient to be independent with initial HEP. Baseline: HEP initiated Goal status: IN PROGRESS    LONG TERM GOALS: Target date: 09/13/2022  Patient to be independent with advanced HEP. Baseline: Not yet initiated  Goal status: IN PROGRESS  Patient to demonstrate B LE strength >/=4+/5.  Baseline: See above Goal status: IN PROGRESS  Patient to demonstrate 10 degrees B ankle dorsiflexion AROM WFL in order to improve efficiency with gait. Baseline: 5 deg B Goal status: IN PROGRESS  Patient to report tolerance for walking for 45 minutes without fatigue limiting.  Baseline: unable Goal status: IN PROGRESS  Patient to demonstrate 5xSTS test without UE support in <15 sec in order to decrease risk of falls.  Baseline: 13 sec with armrests  Goal status: IN PROGRESS  Patient to walk at least 1312 feet during 6 minute walk test in order to score closer to age-matched norms.  Baseline: 1164 ft 08/11/22 Goal status: IN PROGRESS 08/11/22   ASSESSMENT:  CLINICAL IMPRESSION: Patient arrived to session without complaints. Vitals WNL at start of session, thus proceeded while monitoring symptoms and vitals throughout. Patient required cues to self-pace during cardio warm up d/t feeling fatigued early on. Able to progress STS transfers with weighted resistance. Also worked on quick feet with resistance with only 1 episode of LOB requiring min A. Patient  did seem to fatigue more quickly when working on movements requiring speed and power generation. Patient tolerated session well, with intermittent sitting rest breaks required to address fatigue. No complaints upon leaving.     OBJECTIVE IMPAIRMENTS: Abnormal gait, decreased activity tolerance, decreased endurance, decreased ROM, decreased strength, dizziness, impaired flexibility, improper body mechanics,  and postural dysfunction.   ACTIVITY LIMITATIONS: carrying, lifting, bending, standing, squatting, stairs, bathing, and dressing  PARTICIPATION LIMITATIONS: meal prep, cleaning, laundry, shopping, community activity, yard work, and church  PERSONAL FACTORS: Age, Fitness, Past/current experiences, Time since onset of injury/illness/exacerbation, and 3+ comorbidities: DM, HLD, neuropathy, R RTC repair, stage 4 tongue/squamous cell CA, R renal CA  are also affecting patient's functional outcome.   REHAB POTENTIAL: Good  CLINICAL DECISION MAKING: Evolving/moderate complexity  EVALUATION COMPLEXITY: Moderate  PLAN:  PT FREQUENCY: 1-2x/week  PT DURATION: 6 weeks  PLANNED INTERVENTIONS: Therapeutic exercises, Therapeutic activity, Neuromuscular re-education, Balance training, Gait training, Patient/Family education, Self Care, Joint mobilization, Stair training, Vestibular training, Canalith repositioning, DME instructions, Aquatic Therapy, Dry Needling, Electrical stimulation, Cryotherapy, Moist heat, Taping, Manual therapy, and Re-evaluation  PLAN FOR NEXT SESSION: Monitor spO2 during activity; progress hip strengthening and endurance    Janene Harvey, PT, DPT 08/17/22 10:12 AM  [facility] at [facility] [address], [place], [place] [address] Phone # [phone] Fax # [phone]

## 2022-08-17 ENCOUNTER — Ambulatory Visit: Payer: 59 | Admitting: Occupational Therapy

## 2022-08-17 ENCOUNTER — Other Ambulatory Visit: Payer: Self-pay

## 2022-08-17 ENCOUNTER — Ambulatory Visit: Payer: 59 | Admitting: Physical Therapy

## 2022-08-17 ENCOUNTER — Encounter: Payer: Self-pay | Admitting: Physical Therapy

## 2022-08-17 ENCOUNTER — Ambulatory Visit: Payer: 59

## 2022-08-17 DIAGNOSIS — R1313 Dysphagia, pharyngeal phase: Secondary | ICD-10-CM

## 2022-08-17 DIAGNOSIS — M6281 Muscle weakness (generalized): Secondary | ICD-10-CM | POA: Diagnosis not present

## 2022-08-17 DIAGNOSIS — R2689 Other abnormalities of gait and mobility: Secondary | ICD-10-CM | POA: Diagnosis not present

## 2022-08-17 DIAGNOSIS — R262 Difficulty in walking, not elsewhere classified: Secondary | ICD-10-CM | POA: Diagnosis not present

## 2022-08-17 NOTE — Therapy (Signed)
OUTPATIENT SPEECH LANGUAGE PATHOLOGY TREAMENT   Patient Name: [name] MRN: [id number] DOB:19-Jun-1956, 66 y.o., male Today's Date: 08/17/2022  PCP: [name], MD REFERRING PROVIDER: [name], MD (referring),  [name], MD (documentation)  END OF SESSION:  End of Session - 08/17/22 0929     Visit Number 3    Number of Visits 17    Date for SLP Re-Evaluation 10/11/21    SLP Start Time 0847    SLP Stop Time  0930    SLP Time Calculation (min) 43 min    Activity Tolerance Patient tolerated treatment well               Past Medical History:  Diagnosis Date   Alopecia    s/p chemotherapy   Arthritis    Bursitis    pt. denies   Cancer ([place]) 10/2007    tongue/squamous cell,stg IV,HPV positive   Diabetes mellitus without complication ([place])    takes Amaryl,Januvia,and Metformin,daily   DVT, lower extremity ([place]) 2009   side effect from chemo and has blood clot left leg-wears compression stockings;was on Coumadin for 46month and has been off 5 yrs   History of chemotherapy    Cisplatin/Taxotere,5-FU   History of radiation therapy 01/28/08-03/11/08   left base tongue   History of shingles    Hyperlipidemia    takes Vytorin daily   Hypothyroidism    takes Synthroid daily   Joint pain    Joint swelling    Neuropathy    left foot   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 denies   Right kidney mass    Seborrheic keratosis    multiple on back   Past Surgical History:  Procedure Laterality Date   BIOPSY  07/07/2022   Procedure: BIOPSY;  Surgeon: VLoney Laurence DO;  [facility]: [facility]  Service: Gastroenterology;;   CARDIAC CATHETERIZATION     CHEST TUBE INSERTION  07/06/2022   Procedure: CHEST TUBE INSERTION;  Surgeon: BCollene Gobble MD;  [facility];  Service: Cardiopulmonary;;   ESOPHAGOGASTRODUODENOSCOPY (EGD) WITH PROPOFOL N/A  07/07/2022   Procedure: ESOPHAGOGASTRODUODENOSCOPY (EGD) WITH PROPOFOL;  Surgeon: VLoney Laurence DO;  [facility]  Service: Gastroenterology;  Laterality: N/A;   feeding tube placed  2009 and then removed   [place] 08/05[place]   Procedure: LYMPH NODE DISSECTION;  Surgeon: BRaynelle Bring MD;  [facility]: [facility];  Service: Urology;  Laterality: Right;   MOUTH SURGERY     PILONIDAL CYST / SINUS EXCISION  1974   port a cath placement     PORT-A-CATH REMOVAL  2010   right wrist fracture Right 2004   ROBOT ASSISTED LAPAROSCOPIC NEPHRECTOMY Right 08/05/2020   Procedure: XI ROBOTIC ASSISTED LAPAROSCOPIC RADICAL NEPHRECTOMY/PARTIAL ADRENALECTOMY;  Surgeon: BRaynelle Bring MD;  [facility];  Service: Urology;  Laterality: Right;   SHOULDER ARTHROSCOPY WITH DISTAL CLAVICLE RESECTION Right 10/26/2016   Procedure: SHOULDER ARTHROSCOPY WITH DISTAL CLAVICLE RESECTION;  Surgeon: KJustice Britain MD;  Location: [facility]  Service: Orthopedics;  Laterality: Right;   SHOULDER ARTHROSCOPY WITH ROTATOR CUFF REPAIR AND SUBACROMIAL DECOMPRESSION Left 07/16/2014   Procedure: LEFT SHOULDER ARTHROSCOPY WITH ROTATOR CUFF REPAIR AND SUBACROMIAL DECOMPRESSION/DISTAL CLAVICLE RESECTION;  Surgeon: KMarin Shutter MD;  [facility]  Service: Orthopedics;  Laterality: Left;   SHOULDER ARTHROSCOPY WITH ROTATOR CUFF REPAIR AND  SUBACROMIAL DECOMPRESSION Right 10/26/2016   Procedure: RIGHT SHOULDER ARTHROSCOPY WITH ROTATOR CUFF REPAIR AND SUBACROMIAL DECOMPRESSION;  Surgeon: Justice Britain, MD;  Location: [facility];  Service: Orthopedics;  Laterality: Right;  requests 2hrs   teeth extraction #9     Patient Active Problem List   Diagnosis Date Noted   Acute respiratory failure with hypoxia ([place]) 07/11/2022   Leukocytosis 07/11/2022   Dysphagia 07/11/2022   Chronic kidney disease (CKD), stage III (moderate) ([facility]) 07/11/2022   Anemia of chronic disease 07/11/2022    Thrombocytosis 07/11/2022   Pleural effusion 07/06/2022   Aspiration pneumonia ([place]) 07/05/2022   AKI (acute kidney injury) ([place]) 08/04/2021   History of DVT (deep vein thrombosis) 08/04/2021   History of pulmonary embolus (PE) 67/20/9470   Acute metabolic acidosis 96/28/3662   DKA (diabetic ketoacidosis) ([place]) 08/03/2021   Elevated serum creatinine 04/06/2021   Renal cell carcinoma, right ([place]) 08/05/2020   Thyroid activity decreased    Acute pulmonary embolism ([place]) 12/07/2014   Diabetes mellitus type 2 in nonobese ([place]) 12/07/2014   Cancer ([place])    History of angioplasty    Depression    DVT, lower extremity ([place])    Osteoradionecrosis of mandible    Malignant neoplasm of base of tongue ([place]) 07/20/2011   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/20/2011   Pulmonary embolism ([place]) 07/20/2011    ONSET DATE: 2009 after head/neck radiation, script dated 07-14-22   REFERRING DIAG: J18.9 (ICD-10-CM) - Community acquired pneumonia of left lung, unspecified part of lung   THERAPY DIAG:  Dysphagia, pharyngeal phase  Rationale for Evaluation and Treatment: Rehabilitation  SUBJECTIVE:   SUBJECTIVE STATEMENT: "I'm going to call Dr. Redmond Baseman and see about that botox for my vocal cord." Pt accompanied by: self  PERTINENT HISTORY: 66 y.o. male presented with worsening shortness of breath and cough. Chest CT 11/1: "Interval worsening of diffuse bronchial wall thickening with  right lower lobe and right middle lobe development of atypical  infection." OP MBS 05/12/2022 with silent aspiration of all consistencies of liquid.  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  Visited ENT on 03/08/2022 with history as follows: Pt "had base of tongue cancer in 2009 treated with radiation and chemotherapy with cure. He had kidney cancer in 2021. He has noticed more hoarseness with more talking over the past six months. Bulkier foods do not pass very well with swallowing. He coughs a lot when eating. He is able to swallow liquids fine." Pt has a history of left vocal cord paralysis (decreased movmenet of left VF compared to right, good glottic closure, no muscle tension patterns, laryngeal edema is minimal on fiberoptic exam) and tongue cancer. CT on 03/23/22 shows no mass, asymmetric glottic consistent with history of paralysis. No mass on reccurent laryngeal nerve. Pt underwent esophagram on 03/15/22, ordered by Dr Redmond Baseman due to dysphagia. Pt aspirated thick barium and study was terminated. MBS recommended by radiologist.  PAIN:  Are you having pain? No  FALLS: Has patient fallen in last 6 months?  No  LIVING ENVIRONMENT: Lives with: lives with their spouse Lives in: House/apartment  PLOF:  Level of assistance: Independent with ADLs Employment: Retired  PATIENT GOALS: Improve swallowing  OBJECTIVE:   DIAGNOSTIC FINDINGS:  DG  CHEST 07/05/22  IMPRESSION: There is interval appearance of large area of opacification in the left mid and left lower lung fields. Findings suggest pneumonia and large effusion. Part of the effusion appears to be loculated along the lateral chest wall. Possibility of empyema is not excluded. Follow-up CT may be considered.     RECOMMENDATIONS FROM OBJECTIVE SWALLOW STUDY (MBSS/FEES):   Objective swallow impairments: Pt demonstrates an oropharyngeal dysphagia characterized by decreased mobility across pharyngeal structures that leads to mild silent aspiration of liquids, regardless of texture. Pt can achieve adequate glottic closure at the height of the swallow, but epiglottic deflection, reduced laryngeal elevation and reduced UES  opening lead to penetration and residue that falls into the airway just as glottis opens. SLP attempted a variety of compenstory strategies including head turn left, head tilt right, chin tuck, supraglottic swallow. None prevented aspiration. A super-superglotttic swallow may be beneficial, mostly because pt can eject most aspirate with a cued cough and throat clear. With solids there is mild to moderate residue in pyriforms and valleculae due to the the same weakness. Pt usually uses a liquid wash to clear. This is likely the cause of pts globus sensation. A chin tuck with solids did clear a bit more of the stasis than an upright swallow. Effortful swallow not effective. Provided visual feedback to pt and provided extensive education and recommendations post session. Would not recommend any texture modification as there is no beenfit to thickened liquids for this pt. Futhermore, pt has not had any recent repiratory infection that would warrant excessive precaution. However, recommended pt: 1. trial a chin tuck with solids, 2. cough and clear throat frequently with intake even if he doesnt feel the need to do so, 3. complete thorough oral care to reduce bacterial load with aspiration, 4. inform providers of dysphagia and 5. consider f/u with SLP for home exercise program. 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 Exercise and tx targeting pharyngeal musculature could be helpful. Objective recommended compensations:  PRECAUTIONS: Slow rate;Small sips/bites;Follow solids with liquid;Clear throat intermittently;Clear throat after each swallow;Chin tuck BEDSIDE SWALLOW EVAL RESULTS NOVEMBER 2023: Clinical Impression  Pt presents with clinical indicators of pharyngeal dysphagia with known hx of chronic, silent aspiration s/p rad tx for oropharyngeal CA 2009.  Pt completed OP MBSS on 05/12/2022 revealing "oropharyngeal dysphagia  characterized by decreased mobility across pharyngeal structures that leads to mild silent aspiration of liquids, regardless of texture." Recommendations at that time to continue regular diet with thin liquids as modified consistencies were of no benefit.  Aspiration precaution recommendations from MBS as follows: " 1. trial a chin tuck with solids, 2. cough and clear throat frequently with intake even if he doesnt feel the need to do so, 3. complete thorough oral care to reduce bacterial load with aspiration, 4. inform providers of dysphagia and 5. consider f/u with SLP for home exercise program."  These were reviewed with pt.  Pt reports that he did not have any speech therapy follow up after this assessment, but he is agreeable to trial of swallow exercise program in house.  Pt will likely require 6-8 weeks of intensive swallow exercise program to effect change in swallow function and should follow with OP SLP for continued dysphagia management.  Pt with hx VF paralysis.  This was never directly addressed.  Pt's vocal quality is strong and clear in conversation. Today pt exhibited intermittent throat clear with thin liquid and  regular solids.  On palpation, laryngeal elevation was reduced.  Pt achieved greater hyolaryngeal excursion with a sip of water than with voluntary swallow without bolus trial (dry swallow).  Pt notes that soft foods (e.g. applesauce) are easier to eat for him.  There were no clinical s/s of puree.  Even in the absence of clinical s/s of aspiration, there is still a risk for prandial aspiration given hx of silent aspiration of liquid.  Pt would like to resume regular texture diet with thin liquid. He does still c/o of feeling of stasis with solid textures regularly and may benefit from further assessment and possible treatment for esophageal narrowing.  He states he would like to address the feelings of stasis with PO intake.  Consider GI consult if pt is a candidate for espohageal  dilation.  Pt likely does not need a repeat instrumental swallow evaluation at this time.  It is unlikely that swallow function has changed since September, and pt would like to continue oral diet at this time.   Diet Recommendation Regular;Thin liquid  Liquid Administration via: Cup;Straw Medication Administration:  (As tolerated) Supervision: Patient able to self feed  Compensations:  Slow rate; Small sips/bites; Chin tuck with solids; Follow solids with liquid; Clear throat regularly   PATIENT REPORTED OUTCOME MEASURES (PROM): EAT-10: administered today and pt scored 23/30 (lower scores indicate greater QOL from swallowing). "4" was answered for "I cough when I eat", and "swallowing is stressful".   TODAY'S TREATMENT:                                                                                                                                         DATE:  12:14/23: "The last few days I haven't been spitting like I used to." Pt also reports wife notes he doesn't cough as much during meals. Pt req'd initial mod A for super-supraglottic faded to modified independent. Pt was reminded to get at least 7 reps of each exercise and his target is 30/daily.  08/11/22: Pt without hydrophonic voice today upon entry to [facility]. Pt req'd cues for super-supraglottic, but was indpendent with other 2 exercises. SLP provided pt with overt s/sx aspiration PNA - pt told SLP 3 overt s/sx. With POs, pt followed precautions 100%.   08/02/22: SLP reviewed MBS with pt from September and precautions with POs. Pt req'd cues for alternating bite/sip and clear throat intermittently (SLP encouraged pt to clear throat and reswallow after every bite of solids). By session end pt was modified independent with precautions (handout from SLP being used). SLP then educated pt about three swallow exercises: effortful swallow, Mendelsohn, and Super-supraglottic. Pt was mod A usually, faded to modified independent with all  exercises. He was told he will need to do these until 10-05-22 and then another MBS will likely be recommended.   PATIENT EDUCATION: Education details: see "today's treatment" Person educated: Patient Education method: Explanation,  Demonstration, Verbal cues, and Handouts Education comprehension: verbalized understanding, returned demonstration, verbal cues required, and needs further education   ASSESSMENT:  CLINICAL IMPRESSION: Patient is a 66 y.o. male who was seen today for treatment of swallowing precautions and of HEP. Today pt completed HEP with A with super-supraglottic. Overt s/sx aspiration PNA were provided today. SEE TX NOTE for more details. Pt reports his recent GI exam revealed he did NOT require esophageal dilitation/dilation.    OBJECTIVE IMPAIRMENTS: include dysphagia. These impairments are limiting patient from safety when swallowing. Factors affecting potential to achieve goals and functional outcome are previous level of function and severity of impairments. Patient will benefit from skilled SLP services to address above impairments and improve overall function.  REHAB POTENTIAL: Fair given above factors   GOALS: Goals reviewed with patient? Yes  SHORT TERM GOALS: Target date: 09-10-22  Pt will complete HEP with rare min A in two sessions Baseline: 08-11-22 Goal status: ongoing  2.  Pt will follow aspiration precautions from [facility] 05-12-22 with rare min A in 2 sessions Baseline: 08-11-22 Goal status: ongoing  3.  Pt will tell SLP 3 overt s/sx of aspiration PNA with modified independence Baseline:  Goal status: met  4.  Pt will complete PROM in first two therapy sessions Baseline:  Goal status: met    LONG TERM GOALS: Target date: 10-11-22  Pt will follow aspiration precautions from [facility] 05-12-22 with modified independence in 2 sessions Baseline: 08-11-22 Goal status: ongoing  2.  Pt will complete HEP with modified independence n two sessions Baseline:  Goal  status: ongoing  3.  Pt will achieve improvement in measurement of PROM in last 1-2 sessions Baseline:  Goal status: ongoing  4.  Pt will undergo follow up MBS to assess progress Baseline:  Goal status: ongoing   PLAN:  SLP FREQUENCY: 2x/week  SLP DURATION:  17 sessions until 10-11-21  PLANNED INTERVENTIONS: Aspiration precaution training, Pharyngeal strengthening exercises, Diet toleration management , Environmental controls, Trials of upgraded texture/liquids, Internal/external aids, SLP instruction and feedback, Compensatory strategies, and Patient/family education    [facility], [place] 08/17/2022, 9:30 AM

## 2022-08-17 NOTE — Therapy (Signed)
OUTPATIENT OCCUPATIONAL THERAPY  Treatment Note  Patient Name: [name] MRN: [id number] DOB:02/08/1956, 66 y.o., male Today's Date: 08/17/2022  PCP: [name], MD REFERRING PROVIDER: [name], MD  END OF SESSION:  OT End of Session - 08/17/22 1019     Visit Number 3    Number of Visits 7    Date for OT Re-Evaluation 09/15/22    Authorization Type [place] [facility]    OT Start Time 1017    OT Stop Time 1057    OT Time Calculation (min) 40 min               Past Medical History:  Diagnosis Date   Alopecia    s/p chemotherapy   Arthritis    Bursitis    pt. denies   Cancer ([place]) 10/2007    tongue/squamous cell,stg IV,HPV positive   Diabetes mellitus without complication ([place])    takes Amaryl,Januvia,and Metformin,daily   DVT, lower extremity ([place]) 2009   side effect from chemo and has blood clot left leg-wears compression stockings;was on Coumadin for 20month and has been off 5 yrs   History of chemotherapy    Cisplatin/Taxotere,5-FU   History of radiation therapy 01/28/08-03/11/08   left base tongue   History of shingles    Hyperlipidemia    takes Vytorin daily   Hypothyroidism    takes Synthroid daily   Joint pain    Joint swelling    Neuropathy    left foot   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 denies   Right kidney mass    Seborrheic keratosis    multiple on back   Past Surgical History:  Procedure Laterality Date   BIOPSY  07/07/2022   Procedure: BIOPSY;  Surgeon: VLoney Laurence DO;  [place] [facility]  Service: Gastroenterology;;   CARDIAC CATHETERIZATION     CHEST TUBE INSERTION  07/06/2022   Procedure: CHEST TUBE INSERTION;  Surgeon: BCollene Gobble MD;  [facility]  Service: Cardiopulmonary;;   ESOPHAGOGASTRODUODENOSCOPY (EGD) WITH PROPOFOL N/A 07/07/2022   Procedure: ESOPHAGOGASTRODUODENOSCOPY (EGD) WITH PROPOFOL;  Surgeon:  VLoney Laurence DO;  [place] [facility]  Service: Gastroenterology;  Laterality: N/A;   feeding tube placed  2009 and then removed   [place] 08/05/2020   Procedure: LYMPH NODE DISSECTION;  Surgeon: BRaynelle Bring MD;  Location: [facility];  Service: Urology;  Laterality: Right;   MOUTH SURGERY     PILONIDAL CYST / SINUS EXCISION  1974   port a cath placement     PORT-A-CATH REMOVAL  2010   right wrist fracture Right 2004   ROBOT ASSISTED LAPAROSCOPIC NEPHRECTOMY Right 08/05/2020   Procedure: XI ROBOTIC ASSISTED LAPAROSCOPIC RADICAL NEPHRECTOMY/PARTIAL ADRENALECTOMY;  Surgeon: BRaynelle Bring MD;  Location[facility];  Service: Urology;  Laterality: Right;   SHOULDER ARTHROSCOPY WITH DISTAL CLAVICLE RESECTION Right 10/26/2016   Procedure: SHOULDER ARTHROSCOPY WITH DISTAL CLAVICLE RESECTION;  Surgeon: KJustice Britain MD;  [place] [facility]  Service: Orthopedics;  Laterality: Right;   SHOULDER ARTHROSCOPY WITH ROTATOR CUFF REPAIR AND SUBACROMIAL DECOMPRESSION Left 07/16/2014   Procedure: LEFT SHOULDER ARTHROSCOPY WITH ROTATOR CUFF REPAIR AND SUBACROMIAL DECOMPRESSION/DISTAL CLAVICLE RESECTION;  Surgeon: KMarin Shutter MD;  [place] [facility]  Service: Orthopedics;  Laterality: Left;   SHOULDER ARTHROSCOPY WITH ROTATOR CUFF REPAIR AND SUBACROMIAL DECOMPRESSION Right 10/26/2016   Procedure: RIGHT  SHOULDER ARTHROSCOPY WITH ROTATOR CUFF REPAIR AND SUBACROMIAL DECOMPRESSION;  Surgeon: Justice Britain, MD;  Location: [place];  Service: Orthopedics;  Laterality: Right;  requests 2hrs   teeth extraction #9     Patient Active Problem List   Diagnosis Date Noted   Acute respiratory failure with hypoxia ([place]) 07/11/2022   Leukocytosis 07/11/2022   Dysphagia 07/11/2022   Chronic kidney disease (CKD), stage III (moderate) ([facility]) 07/11/2022   Anemia of chronic disease 07/11/2022   Thrombocytosis 07/11/2022   Pleural effusion 07/06/2022   Aspiration pneumonia ([place])  07/05/2022   AKI (acute kidney injury) ([place]) 08/04/2021   History of DVT (deep vein thrombosis) 08/04/2021   History of pulmonary embolus (PE) 69/62/9528   Acute metabolic acidosis 41/32/4401   DKA (diabetic ketoacidosis) ([place]) 08/03/2021   Elevated serum creatinine 04/06/2021   Renal cell carcinoma, right ([place]) 08/05/2020   Thyroid activity decreased    Acute pulmonary embolism ([place]) 12/07/2014   Diabetes mellitus type 2 in nonobese ([place]) 12/07/2014   Cancer ([place])    History of angioplasty    Depression    DVT, lower extremity ([place])    Osteoradionecrosis of mandible    Malignant neoplasm of base of tongue ([place]) 07/20/2011   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/20/2011   Pulmonary embolism ([place]) 07/20/2011    ONSET DATE: 07/05/22  REFERRING DIAG: J18.9 (ICD-10-CM) - Community acquired pneumonia of left lung, unspecified part of lung  THERAPY DIAG:  Muscle weakness (generalized)  Other abnormalities of gait and mobility  Rationale for Evaluation and Treatment: Rehabilitation  SUBJECTIVE:   SUBJECTIVE STATEMENT: Pt reports that his HR and O2 sats have been pretty consistent. 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  PRECAUTIONS: Other: frozen shoulder L  WEIGHT BEARING RESTRICTIONS: No  PAIN:  Are you having pain? No  FALLS: Has patient fallen in last 6 months? No   PATIENT GOALS: to get my strength back  OBJECTIVE:   HAND DOMINANCE: Right  ADLs: Overall ADLs: Pt reports that he is no longer sitting in the chair in the shower anymore.  He reports that he is sweeping/vacuuming some.  Mod I with bathing and dressing.  Reports that he notifies his wife that he is going to shower.   Equipment: Walk in shower  IADLs: Shopping: now limits to short grocery trips due to decreased endurance. Orders grocery pickup for larger grocery runs Light housekeeping: vacuuming, cleaning bathroom.  Reports he "does a little bit and sit down" Meal Prep: enjoys cooking, feels that he is able to cook at baseline level Community mobility: driving Medication management: no issues, taking them in applesauce  MOBILITY STATUS:  decreased endurance  POSTURE COMMENTS:  No Significant postural limitations  ACTIVITY TOLERANCE: Activity tolerance: HR and O2 fluctuate with activity  UPPER EXTREMITY ROM:  RUE shoulder flexion 160        LUE shoulder flexion 90   UPPER EXTREMITY MMT:   RUE 4+/5         LUE 4/5 shoulder, 4+/5 overall  HAND FUNCTION: Grip strength: Right: 74 lbs; Left: 69 lbs, Lateral pinch: Right: 16 lbs, Left: 14.5 lbs, and 3 point pinch: Right: 18 lbs, Left: 14 lbs   OBSERVATIONS:  HR elevated and O2 sats drop with activity, requires frequent rest breaks.   TODAY'S TREATMENT:                                                           08/17/22 IADL: reports needing to "go at my own pace" with household tasks.  Pt is resealing shower and tub in bathroom and reports taking breaks as needed.  Pt reports that he can clean the entire downstairs, vacuuming 3 rooms without needing a rest break. Pt reports washing his truck without issue.   Energy conservation strategies: Educated on 4 P's (planning, prioritizing, pacing, positioning) in regards to increasing activity tolerance and engagement in IADLs. Breathing: reiterated diaphragmatic breathing for increased breath support with activity and at rest.  Pt able to return demonstration. Dynamic activity: Ambulated around treatment space 5 mins while tossing ball with BUE.  OT challenged pt with dual tasking to engage in naming of items  while ambulating to increase challenge.  Pt O2: 96% and HR 110 after activity.   08/07/22 Therapeutic exercises: Shoulder flexion with LUE placed on cabinet shelf, incorporating squats to challenge balance while increasing shoulder flexion. Internal and external rotation with elbow at 90*, bicep curls, and PNF diagonal with yellow theraband.  Completed 10x2 with hold for 3-5 seconds at end range.  OT educated on modifications to exercises to compensate for decreased shoulder flexion on L.  OT provided demonstration and min verbal cues for technique to facilitate carryover.  Educated on completing bilaterally with resistance for strengthening.   PATIENT EDUCATION: Education details: Educated on theraband HEP for LUE and RUE strengthening. Person educated: Patient Education method: Explanation Education comprehension: verbalized understanding  HOME EXERCISE PROGRAM: Access Code: [email or web address] URL: [email or web address] Date: 08/07/2022 Prepared by: [facility]  Exercises - Seated Single Arm Elbow Flexion with Resistance  - 2 sets - 10 reps - Standing Shoulder Single Arm PNF D2 Flexion with Resistance  - 2 sets - 10 reps  GOALS: Goals reviewed with patient? Yes  SHORT TERM GOALS: Target date: 08/26/23  Pt will be independent with HEP for strengthening. Baseline: Goal status: IN PROGRESS  2.  Pt will report able to vacuum entire room without rest break. Baseline:  Goal status: MET - 08/17/22  3.  Pt will verbalize understanding of energy conservation strategies and report 2 being utilized at home. Baseline:  Goal status: IN PROGRESS   LONG TERM GOALS: Target date: 09/15/22  Pt will be independent with advanced HEP for strengthening to allow for resumption of leisure tasks (Yard work and fishing).  Baseline:  Goal status: IN PROGRESS  2.  Pt will report able to vacuum 2-3 entire rooms without rest break. Baseline:  Goal status:  MET - 08/17/22  3.  Pt will demonstrate and/or report increased BUE strength as needed to start up leaf blower. Baseline:  Goal status: IN PROGRESS  4.  Pt will complete 15 mins moderate challenging dynamic standing activity without significant change in HR or O2 sats. Baseline:  Goal status: IN PROGRESS  ASSESSMENT:  CLINICAL IMPRESSION: Pt is making great progress towards goals, reporting that he is now able to increase endurance and activity tolerance to complete more household activities.  Pt tolerating increased activity this session, to include high level  activity with PT prior to session.   PERFORMANCE DEFICITS: in functional skills including IADLs, ROM, strength, flexibility, balance, endurance, and cardiopulmonary status limiting function and psychosocial skills including environmental adaptation and routines and behaviors.   IMPAIRMENTS: are limiting patient from IADLs and leisure.   CO-MORBIDITIES: may have co-morbidities  that affects occupational performance. Patient will benefit from skilled OT to address above impairments and improve overall function.  MODIFICATION OR ASSISTANCE TO COMPLETE EVALUATION: No modification of tasks or assist necessary to complete an evaluation.  OT OCCUPATIONAL PROFILE AND HISTORY: Detailed assessment: Review of records and additional review of physical, cognitive, psychosocial history related to current functional performance.  CLINICAL DECISION MAKING: LOW - limited treatment options, no task modification necessary  REHAB POTENTIAL: Good  EVALUATION COMPLEXITY: Low    PLAN:  OT FREQUENCY: 1x/week  OT DURATION: 6 weeks  PLANNED INTERVENTIONS: self care/ADL training, therapeutic exercise, therapeutic activity, balance training, functional mobility training, ultrasound, moist heat, cryotherapy, patient/family education, energy conservation, coping strategies training, and DME and/or AE instructions  RECOMMENDED OTHER SERVICES:  NA  CONSULTED AND AGREED WITH PLAN OF CARE: Patient  PLAN FOR NEXT SESSION: Review strengthening HEP with theraband and add weights as appropriate.   HOXIE, SARAH, OTR/L 08/17/2022, 11:05 AM          

## 2022-08-21 ENCOUNTER — Ambulatory Visit: Payer: 59

## 2022-08-21 ENCOUNTER — Other Ambulatory Visit (HOSPITAL_COMMUNITY): Payer: Self-pay

## 2022-08-21 ENCOUNTER — Ambulatory Visit: Payer: 59 | Admitting: Occupational Therapy

## 2022-08-21 DIAGNOSIS — M6281 Muscle weakness (generalized): Secondary | ICD-10-CM

## 2022-08-21 DIAGNOSIS — R2689 Other abnormalities of gait and mobility: Secondary | ICD-10-CM

## 2022-08-21 DIAGNOSIS — R262 Difficulty in walking, not elsewhere classified: Secondary | ICD-10-CM | POA: Diagnosis not present

## 2022-08-21 DIAGNOSIS — R1313 Dysphagia, pharyngeal phase: Secondary | ICD-10-CM | POA: Diagnosis not present

## 2022-08-21 NOTE — Therapy (Signed)
OUTPATIENT OCCUPATIONAL THERAPY  Treatment Note & Discharge Note  Patient Name: [name] MRN: [id number] DOB:06-Apr-1956, 66 y.o., male Today's Date: 08/21/2022  PCP: [name], MD REFERRING PROVIDER: [name], MD  OCCUPATIONAL THERAPY DISCHARGE SUMMARY  Visits from Start of Care: 4  Current functional level related to goals / functional outcomes: Pt is Mod I to Independent with all ADLs and IADLs.  Pt reports and demonstrates improved activity tolerance to complete IADLs.  Pt is participating in strengthening HEP and plans to pursue Silver Sneakers in 2024 for continued strength and endurance.   Remaining deficits: LUE weakness, h/o frozen shoulder   Education / Equipment: HEP for strengthening, energy conservation.   Patient agrees to discharge. Patient goals were met. Patient is being discharged due to being pleased with current level and meeting the stated rehab goals..     END OF SESSION:  OT End of Session - 08/21/22 1022     Visit Number 4    Number of Visits 7    Date for OT Re-Evaluation 09/15/22    Authorization Type [employer] [employer]    OT Start Time 1019    OT Stop Time 1054    OT Time Calculation (min) 35 min                Past Medical History:  Diagnosis Date   Alopecia    s/p chemotherapy   Arthritis    Bursitis    pt. denies   Cancer ([place]) 10/2007    tongue/squamous cell,stg IV,HPV positive   Diabetes mellitus without complication ([place])    takes Amaryl,Januvia,and Metformin,daily   DVT, lower extremity ([place]) 2009   side effect from chemo and has blood clot left leg-wears compression stockings;was on Coumadin for 107month and has been off 5 yrs   History of chemotherapy    Cisplatin/Taxotere,5-FU   History of radiation therapy 01/28/08-03/11/08   left base tongue   History of shingles    Hyperlipidemia    takes Vytorin daily   Hypothyroidism    takes Synthroid daily   Joint pain    Joint swelling    Neuropathy     left foot   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 denies   Right kidney mass    Seborrheic keratosis    multiple on back   Past Surgical History:  Procedure Laterality Date   BIOPSY  07/07/2022   Procedure: BIOPSY;  Surgeon: VLoney Laurence DO;  [facility]  Service: Gastroenterology;;   CARDIAC CATHETERIZATION     CHEST TUBE INSERTION  07/06/2022   Procedure: CHEST TUBE INSERTION;  Surgeon: BCollene Gobble MD;  [facility];  Service: Cardiopulmonary;;   ESOPHAGOGASTRODUODENOSCOPY (EGD) WITH PROPOFOL N/A 07/07/2022   Procedure: ESOPHAGOGASTRODUODENOSCOPY (EGD) WITH PROPOFOL;  Surgeon: VLoney Laurence DO;  [facility]  Service: Gastroenterology;  Laterality: N/A;   feeding tube placed  2009 and then removed   [place] 08/05/2020   Procedure: LYMPH NODE DISSECTION;  Surgeon: BRaynelle Bring MD;  [facility]: [facility];  Service: Urology;  Laterality: Right;   MOUTH SURGERY     PILONIDAL CYST / SINUS EXCISION  1974   port a cath placement     PORT-A-CATH REMOVAL  2010   right wrist fracture Right 2004   ROBOT ASSISTED LAPAROSCOPIC NEPHRECTOMY Right 08/05/2020   Procedure:  XI ROBOTIC ASSISTED LAPAROSCOPIC RADICAL NEPHRECTOMY/PARTIAL ADRENALECTOMY;  Surgeon: Raynelle Bring, MD;  Location: [facility];  Service: Urology;  Laterality: Right;   SHOULDER ARTHROSCOPY WITH DISTAL CLAVICLE RESECTION Right 10/26/2016   Procedure: SHOULDER ARTHROSCOPY WITH DISTAL CLAVICLE RESECTION;  Surgeon: Justice Britain, MD;  Location: [facility];  Service: Orthopedics;  Laterality: Right;   SHOULDER ARTHROSCOPY WITH ROTATOR CUFF REPAIR AND SUBACROMIAL DECOMPRESSION Left 07/16/2014   Procedure: LEFT SHOULDER ARTHROSCOPY WITH ROTATOR CUFF REPAIR AND SUBACROMIAL DECOMPRESSION/DISTAL CLAVICLE RESECTION;  Surgeon: Marin Shutter, MD;  Location: [facility];  Service:  Orthopedics;  Laterality: Left;   SHOULDER ARTHROSCOPY WITH ROTATOR CUFF REPAIR AND SUBACROMIAL DECOMPRESSION Right 10/26/2016   Procedure: RIGHT SHOULDER ARTHROSCOPY WITH ROTATOR CUFF REPAIR AND SUBACROMIAL DECOMPRESSION;  Surgeon: Justice Britain, MD;  Location: [facility];  Service: Orthopedics;  Laterality: Right;  requests 2hrs   teeth extraction #9     Patient Active Problem List   Diagnosis Date Noted   Acute respiratory failure with hypoxia ([place]) 07/11/2022   Leukocytosis 07/11/2022   Dysphagia 07/11/2022   Chronic kidney disease (CKD), stage III (moderate) ([facility]) 07/11/2022   Anemia of chronic disease 07/11/2022   Thrombocytosis 07/11/2022   Pleural effusion 07/06/2022   Aspiration pneumonia ([place]) 07/05/2022   AKI (acute kidney injury) ([place]) 08/04/2021   History of DVT (deep vein thrombosis) 08/04/2021   History of pulmonary embolus (PE) 06/20/5101   Acute metabolic acidosis 58/52/7782   DKA (diabetic ketoacidosis) ([place]) 08/03/2021   Elevated serum creatinine 04/06/2021   Renal cell carcinoma, right ([place]) 08/05/2020   Thyroid activity decreased    Acute pulmonary embolism ([place]) 12/07/2014   Diabetes mellitus type 2 in nonobese ([place]) 12/07/2014   Cancer ([place])    History of angioplasty    Depression    DVT, lower extremity ([place])    Osteoradionecrosis of mandible    Malignant neoplasm of base of tongue ([place]) 07/20/2011   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/20/2011   Pulmonary embolism ([place]) 07/20/2011    ONSET DATE: 07/05/22  REFERRING DIAG: J18.9 (ICD-10-CM) - Community acquired pneumonia of left lung, unspecified part of lung  THERAPY DIAG:  Muscle weakness (generalized)  Other abnormalities of gait and mobility  Rationale for Evaluation and Treatment: Rehabilitation  SUBJECTIVE:   SUBJECTIVE STATEMENT: Pt reports that his granddaughter had contracted Covid, so he was staying with his son.   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  PRECAUTIONS: Other: frozen shoulder L  WEIGHT BEARING RESTRICTIONS: No  PAIN:  Are you having pain? No  FALLS: Has patient fallen in last 6 months? No   PATIENT GOALS: to get my strength back  OBJECTIVE:   HAND DOMINANCE: Right  ADLs: Overall ADLs: Pt reports that he is no longer sitting in the chair in the shower anymore.  He reports that he is sweeping/vacuuming some. Mod I with bathing and dressing.  Reports that he notifies his wife that he is going to shower.   Equipment: Walk in shower  IADLs: Shopping: now limits to short grocery trips due to decreased endurance. Orders grocery pickup for larger grocery runs Light housekeeping: vacuuming, cleaning bathroom.  Reports he "does a little bit and sit down" Meal Prep:  enjoys cooking, feels that he is able to cook at baseline level Community mobility: driving Medication management: no issues, taking them in applesauce  MOBILITY STATUS:  decreased endurance  POSTURE COMMENTS:  No Significant postural limitations  ACTIVITY TOLERANCE: Activity tolerance: HR and O2 fluctuate with activity  UPPER EXTREMITY ROM:  RUE shoulder flexion 160        LUE shoulder flexion 90   UPPER EXTREMITY MMT:   RUE 4+/5         LUE 4/5 shoulder, 4+/5 overall  HAND FUNCTION: Grip strength: Right: 74 lbs; Left: 69 lbs, Lateral pinch: Right: 16 lbs, Left: 14.5 lbs, and 3 point pinch: Right: 18 lbs, Left: 14 lbs   OBSERVATIONS: HR elevated and O2 sats drop with activity, requires frequent rest breaks.   TODAY'S TREATMENT:                                                            08/21/22 IADL: reports starting up leaf blower without any issue.  Pt reports able to blow leaves off deck and grill without issue. Strengthening: completed bicep curls, scapular retraction, bent over singular scapular row, and tricep extension with 5# dumbbell.  Completed with RUE, LUE, and BUE as appropriate.  Pt demonstrating good technique both in and standing position. Silver sneakers: engaged in discussion about Silver Sneakers programs for continued strengthening and endurance.  Pt plans to assess his options and enroll in 2024.   08/17/22 IADL: reports needing to "go at my own pace" with household tasks.  Pt is resealing shower and tub in bathroom and reports taking breaks as needed.  Pt reports that he can clean the entire downstairs, vacuuming 3 rooms without needing a rest break. Pt reports washing his truck without issue.   Energy conservation strategies: Educated on 4 P's (planning, prioritizing, pacing, positioning) in regards to increasing activity tolerance and engagement in IADLs. Breathing: reiterated diaphragmatic breathing for increased breath support with activity and at rest.  Pt able to return demonstration. Dynamic activity: Ambulated around treatment space 5 mins while tossing ball with BUE.  OT challenged pt with dual tasking to engage in naming of items while ambulating to increase challenge.  Pt O2: 96% and HR 110 after activity.   08/07/22 Therapeutic exercises: Shoulder flexion with LUE placed on cabinet shelf, incorporating squats to challenge balance while increasing shoulder flexion. Internal and external rotation with elbow at 90*, bicep curls, and PNF diagonal with yellow theraband.  Completed 10x2 with hold for 3-5 seconds at end range.  OT educated on modifications to exercises to compensate for decreased shoulder flexion on L.  OT provided demonstration and min verbal cues for technique to facilitate carryover.  Educated on completing bilaterally with resistance  for strengthening.   PATIENT EDUCATION: Education details: Educated on theraband HEP for LUE and RUE strengthening. Person educated: Patient Education method: Explanation Education comprehension: verbalized understanding  HOME EXERCISE PROGRAM: Access Code: [email or web address] URL: [email or web address] Date: 08/21/2022 Prepared by: [facility]  Exercises - Seated Single Arm Elbow Flexion with Resistance  - 3-4 x weekly - 2 sets - 10 reps - Standing Shoulder Single Arm PNF D2 Flexion with Resistance  - 3-4 x weekly - 2 sets - 10 reps - Seated Scapular Retraction  -  3-4 x weekly - 2 sets - 10 reps - Standing Bent Over on Chair Single Arm Tricep Extension with Dumbbell  - 3-4 x weekly - 2 sets - 10 reps - Standing Bent Over Single Arm Scapular Row with Table Support  - 3-4 x weekly - 2 sets - 10 reps  GOALS: Goals reviewed with patient? Yes  SHORT TERM GOALS: Target date: 08/26/23  Pt will be independent with HEP for strengthening. Baseline: Goal status: MET - 08/21/22  2.  Pt will report able to vacuum entire room without rest break. Baseline:  Goal status: MET - 08/17/22  3.  Pt will verbalize understanding of energy conservation strategies and report 2 being utilized at home. Baseline:  Goal status: MET - 08/21/22   LONG TERM GOALS: Target date: 09/15/22  Pt will be independent with advanced HEP for strengthening to allow for resumption of leisure tasks (Yard work and fishing).  Baseline:  Goal status: MET - 08/21/22  2.  Pt will report able to vacuum 2-3 entire rooms without rest break. Baseline:  Goal status: MET - 08/17/22  3.  Pt will demonstrate and/or report increased BUE strength as needed to start up leaf blower. Baseline:  Goal status: MET - 08/21/22  4.  Pt will complete 15 mins moderate challenging dynamic standing activity without significant change in HR or O2 sats. Baseline:  Goal status:  MET  ASSESSMENT:  CLINICAL IMPRESSION: Pt is demonstrating and reporting increased endurance and activity tolerance to complete more household activities.  Pt is demonstrating increased UE strength to start leaf blower and complete simple yard work.  Pt demonstrating good body mechanics with UE strengthening this session.  Pt is pleased with progress and agreeable to d/c.  PERFORMANCE DEFICITS: in functional skills including IADLs, ROM, strength, flexibility, balance, endurance, and cardiopulmonary status limiting function and psychosocial skills including environmental adaptation and routines and behaviors.   IMPAIRMENTS: are limiting patient from IADLs and leisure.   CO-MORBIDITIES: may have co-morbidities  that affects occupational performance. Patient will benefit from skilled OT to address above impairments and improve overall function.  MODIFICATION OR ASSISTANCE TO COMPLETE EVALUATION: No modification of tasks or assist necessary to complete an evaluation.  OT OCCUPATIONAL PROFILE AND HISTORY: Detailed assessment: Review of records and additional review of physical, cognitive, psychosocial history related to current functional performance.  CLINICAL DECISION MAKING: LOW - limited treatment options, no task modification necessary  REHAB POTENTIAL: Good  EVALUATION COMPLEXITY: Low    PLAN:  OT FREQUENCY: 1x/week  OT DURATION: 6 weeks  PLANNED INTERVENTIONS: self care/ADL training, therapeutic exercise, therapeutic activity, balance training, functional mobility training, ultrasound, moist heat, cryotherapy, patient/family education, energy conservation, coping strategies training, and DME and/or AE instructions  RECOMMENDED OTHER SERVICES: NA  CONSULTED AND AGREED WITH PLAN OF CARE: Patient  PLAN FOR NEXT SESSION: D/C from [place], Judson Roch, OTR/L 08/21/2022, 12:16 PM

## 2022-08-21 NOTE — Therapy (Signed)
OUTPATIENT PHYSICAL THERAPY NEURO TREATMENT   Patient Name: [name] MRN: [id number] DOB:[birth year], 66 y.o., male Today's Date: 08/21/2022   PCP: [name], MD  REFERRING PROVIDER: [name], MD  END OF SESSION:  PT End of Session - 08/21/22 1150     Visit Number 4    Number of Visits 13    Date for PT Re-Evaluation 09/13/22    Authorization Type [facility]    Authorization - Visit Number 4    Authorization - Number of Visits 25    PT Start Time 2536    PT Stop Time 1230    PT Time Calculation (min) 45 min    Equipment Utilized During Treatment Gait belt    Activity Tolerance Patient tolerated treatment well;Patient limited by fatigue    Behavior During Therapy WFL for tasks assessed/performed               Past Medical History:  Diagnosis Date   Alopecia    s/p chemotherapy   Arthritis    Bursitis    pt. 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,stg IV,HPV positive   Diabetes mellitus without complication ([place])    takes Amaryl,Januvia,and Metformin,daily   DVT, lower extremity ([place]) 2009   side effect from chemo and has blood clot left leg-wears compression stockings;was on Coumadin for 106months and has been off 5 yrs   History of chemotherapy    Cisplatin/Taxotere,5-FU   History of radiation therapy 01/28/08-03/11/08   left base tongue   History of shingles    Hyperlipidemia    takes Vytorin daily   Hypothyroidism    takes Synthroid daily   Joint pain    Joint swelling    Neuropathy    left foot   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 denies   Right kidney mass    Seborrheic keratosis    multiple on back   Past Surgical History:  Procedure Laterality Date   BIOPSY  07/07/2022   Procedure: BIOPSY;  Surgeon: Loney Laurence, DO;  Location: [facility];  Service: Gastroenterology;;   CARDIAC CATHETERIZATION     CHEST TUBE INSERTION   07/06/2022   Procedure: CHEST TUBE INSERTION;  Surgeon: Collene Gobble, MD;  Location: [facility];  Service: Cardiopulmonary;;   ESOPHAGOGASTRODUODENOSCOPY (EGD) WITH PROPOFOL N/A 07/07/2022   Procedure: ESOPHAGOGASTRODUODENOSCOPY (EGD) WITH PROPOFOL;  Surgeon: Loney Laurence, DO;  Location: [facility];  Service: Gastroenterology;  Laterality: N/A;   feeding tube placed  2009 and then removed   [place] Right 08/05/2020   Procedure: LYMPH NODE DISSECTION;  Surgeon: Raynelle Bring, MD;  Location: [facility];  Service: Urology;  Laterality: Right;   MOUTH SURGERY     PILONIDAL CYST / SINUS EXCISION  1974   port a cath placement     PORT-A-CATH REMOVAL  2010   right wrist fracture Right 2004   ROBOT ASSISTED LAPAROSCOPIC NEPHRECTOMY Right 08/05/2020   Procedure: XI ROBOTIC ASSISTED LAPAROSCOPIC RADICAL NEPHRECTOMY/PARTIAL ADRENALECTOMY;  Surgeon: Raynelle Bring, MD;  Location: [facility];  Service: Urology;  Laterality: Right;   SHOULDER ARTHROSCOPY WITH DISTAL CLAVICLE RESECTION Right 10/26/2016   Procedure: SHOULDER ARTHROSCOPY WITH DISTAL CLAVICLE RESECTION;  Surgeon: Justice Britain, MD;  Location: [facility];  Service: Orthopedics;  Laterality: Right;   SHOULDER ARTHROSCOPY WITH ROTATOR CUFF REPAIR AND SUBACROMIAL DECOMPRESSION Left 07/16/2014  Procedure: LEFT SHOULDER ARTHROSCOPY WITH ROTATOR CUFF REPAIR AND SUBACROMIAL DECOMPRESSION/DISTAL CLAVICLE RESECTION;  Surgeon: Marin Shutter, MD;  Location: [facility];  Service: Orthopedics;  Laterality: Left;   SHOULDER ARTHROSCOPY WITH ROTATOR CUFF REPAIR AND SUBACROMIAL DECOMPRESSION Right 10/26/2016   Procedure: RIGHT SHOULDER ARTHROSCOPY WITH ROTATOR CUFF REPAIR AND SUBACROMIAL DECOMPRESSION;  Surgeon: Justice Britain, MD;  Location: [facility];  Service: Orthopedics;  Laterality: Right;  requests 2hrs   teeth extraction #9     Patient Active Problem List   Diagnosis Date Noted   Acute respiratory failure with hypoxia ([place])  07/11/2022   Leukocytosis 07/11/2022   Dysphagia 07/11/2022   Chronic kidney disease (CKD), stage III (moderate) (HCC) 07/11/2022   Anemia of chronic disease 07/11/2022   Thrombocytosis 07/11/2022   Pleural effusion 07/06/2022   Aspiration pneumonia ([place]) 07/05/2022   AKI (acute kidney injury) ([place]) 08/04/2021   History of DVT (deep vein thrombosis) 08/04/2021   History of pulmonary embolus (PE) 24/23/5361   Acute metabolic acidosis 44/31/5400   DKA (diabetic ketoacidosis) ([place]) 08/03/2021   Elevated serum creatinine 04/06/2021   Renal cell carcinoma, right ([place]) 08/05/2020   Thyroid activity decreased    Acute pulmonary embolism ([place]) 12/07/2014   Diabetes mellitus type 2 in nonobese ([place]) 12/07/2014   Cancer ([place])    History of angioplasty    Depression    DVT, lower extremity ([place])    Osteoradionecrosis of mandible    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  Rationale for Evaluation and Treatment: Rehabilitation  SUBJECTIVE:                                                                                                                                                                                             SUBJECTIVE STATEMENT: "Not a thing" new.  Pt accompanied by: self  PERTINENT HISTORY: DM, HLD, neuropathy, R RTC repair, stage 4 tongue/squamous cell CA, R renal CA  PAIN:  Are you having pain? No  PRECAUTIONS: Other: per patient, aspiration precautions- no bread, no crackers, thin liquids okay Per fax from Dr. Harrington Challenger 08/08/22 "pt tachy all the time ok to treat if pulse >100."  WEIGHT BEARING RESTRICTIONS: No  FALLS: Has patient fallen in last 6 months?  No  LIVING ENVIRONMENT: Lives with: lives with their spouse Lives in: House/apartment Stairs:  3 steps to enter  without handrail; 14 steps to 2nd floor  Has following equipment at home: Shower bench  PLOF: Independent; retired; would like to return to hunting, fishing, woodworking   PATIENT GOALS: would like to return to hunting, fishing, woodworking   OBJECTIVE:  Vitals: 140/90 mmHg, 99 bpm at rest  TODAY'S TREATMENT: 08/21/22 Activity Comments  Vitals assessment   Retrowalk, tandem walk x 2 min  HR elevated to 137 bpm after   Sit-stand 14x 10x with high knee tap, notes DOE  Standing hip abd, ext, flex (march) 1x10 Yellow t-band for HEP review, cues for hip extension isolation  Sidestep x 2 min Yellow t-loop  Alt step ups 1x12 8"      TODAY'S TREATMENT: 08/17/22 Activity Comments  Vitals at start of session  95% spO2, 98 bpm  Nustep L5 x 7 min Les only Maintaining ~70-90 SPM At 3.5 min: 97% spO2, 124 bpm Rest break at 5 min: 98% spO2, 120 bpm   STS sitting on airex 10x, 10x with green medball  Without Ues; limited eccentric control  alt toe tap on step #2 ankle weights 30" without UE support  34 reps; 30 reps, 33 reps  step up + opposite SKTC 10x each LE Light B UE support required for balance; CGA  sidestepping  yellow TB 4x91ft  CGA; rest breaks in between; 95% 119bpm; good long steps, cueing for taller posture        HOME EXERCISE PROGRAM Last updated: 08/11/22 Access Code: [email or web address] URL: [email or web address] Date: 08/11/2022 Prepared by: [facility]  Exercises - Sit to Stand with Counter Support  - 1 x daily - 5 x weekly - 3 sets - 10 reps - Gastroc Stretch on Wall  - 1 x daily - 5 x weekly - 2 sets - 30 hold - Marching with Resistance  - 1 x daily - 5 x weekly - 2 sets - 10 reps - Standing Hip Abduction with Resistance at Ankles and Counter Support  - 1 x daily - 5 x weekly - 2 sets - 10 reps - Standing Hip  Extension with Resistance at Ankles and Counter Support  - 1 x daily - 5 x weekly - 2 sets - 10 reps   Below measures were taken at time of initial evaluation unless otherwise specified:   DIAGNOSTIC FINDINGS: 07/24/22 chest xray: Similar to slightly increased loculated left hydropneumothorax. Trace thickening of the right inferolateral pleura may represent trace pleural effusion. Similar asymmetrically low left lung volumes with left basilar patchy opacities, likely atelectasis.  COGNITION: Overall cognitive status: Within functional limits for tasks assessed   SENSATION: Reports a little N/T in B feet from neuropathy  COORDINATION: Alternating pronation/supination: WNL B Alternating toe tap: WNL B Finger to nose: WNL B    POSTURE: rounded shoulders, forward head, and increased thoracic kyphosis  LOWER EXTREMITY ROM:     Active  Right Eval Left Eval  Hip flexion    Hip extension    Hip abduction    Hip adduction    Hip internal rotation    Hip external rotation    Knee flexion    Knee extension    Ankle dorsiflexion 5 5  Ankle plantarflexion    Ankle inversion    Ankle eversion     (Blank rows = not tested)  LOWER EXTREMITY MMT:    MMT (in sitting) Right Eval Left Eval  Hip flexion 4 4-  Hip extension    Hip abduction  4+ 4+  Hip adduction 4- 4  Hip internal rotation    Hip external rotation    Knee flexion 5 4+  Knee extension 5 5  Ankle dorsiflexion 4+ 4+  Ankle plantarflexion 4+ 4+  Ankle inversion    Ankle eversion    (Blank rows = not tested)   GAIT: Gait pattern: Good gait speed and step length but with L lateral trunk lean and thoracic kyphosis  Assistive device utilized: None Level of assistance: Complete Independence  FUNCTIONAL TESTS:        TODAY'S TREATMENT:                                                                                                                              DATE: 08/02/22    PATIENT EDUCATION: Education  details: prognosis, POC, HEP, edu on exam findings  Person educated: Patient Education method: Explanation, Demonstration, Tactile cues, Verbal cues, and Handouts Education comprehension: verbalized understanding  HOME EXERCISE PROGRAM: Access Code: [email or web address] URL: [email or web address] Date: 08/02/2022 Prepared by: [facility]  Exercises - Sit to Stand with Counter Support  - 1 x daily - 5 x weekly - 3 sets - 10 reps - Gastroc Stretch on Wall  - 1 x daily - 5 x weekly - 2 sets - 30 hold - Standing Hip Abduction with Counter Support  - 1 x daily - 5 x weekly - 2 sets - 10 reps - Standing Hip Extension with Counter Support  - 1 x daily - 5 x weekly - 2 sets - 10 reps   GOALS: Goals reviewed with patient? Yes  SHORT TERM GOALS: Target date: 08/23/2022  Patient to be independent with initial HEP. Baseline: HEP initiated Goal status: IN PROGRESS    LONG TERM GOALS: Target date: 09/13/2022  Patient to be independent with advanced HEP. Baseline: Not yet initiated  Goal status: IN PROGRESS  Patient to demonstrate B LE strength >/=4+/5.  Baseline: See above Goal status: IN PROGRESS  Patient to demonstrate 10 degrees B ankle dorsiflexion AROM WFL in order to improve efficiency with gait. Baseline: 5 deg B Goal status: IN PROGRESS  Patient to report tolerance for walking for 45 minutes without fatigue limiting.  Baseline: unable Goal status: IN PROGRESS  Patient to demonstrate 5xSTS test without UE support in <15 sec in order to decrease risk of falls.  Baseline: 13 sec with armrests  Goal status: IN PROGRESS  Patient to walk at least 1312 feet during 6 minute walk test in order to score closer to age-matched norms.  Baseline: 1164 ft 08/11/22 Goal status: IN PROGRESS 08/11/22   ASSESSMENT:  CLINICAL IMPRESSION: Continued with activities to improve balance and activity tolerance with vitals monitored throughout session  and reveal instances of tachycardia from resting 99 bpm to 137 bpm with noticeable DOE and able to recover to resting rate of 90 bpm and 96% O2 after 1-2 min. Tolerating activities  well without adverse effects and recommend continued strength/endurance/balance activities to facilitate return to typical activity level    OBJECTIVE IMPAIRMENTS: Abnormal gait, decreased activity tolerance, decreased endurance, decreased ROM, decreased strength, dizziness, impaired flexibility, improper body mechanics, and postural dysfunction.   ACTIVITY LIMITATIONS: carrying, lifting, bending, standing, squatting, stairs, bathing, and dressing  PARTICIPATION LIMITATIONS: meal prep, cleaning, laundry, shopping, community activity, yard work, and church  PERSONAL FACTORS: Age, Fitness, Past/current experiences, Time since onset of injury/illness/exacerbation, and 3+ comorbidities: DM, HLD, neuropathy, R RTC repair, stage 4 tongue/squamous cell CA, R renal CA  are also affecting patient's functional outcome.   REHAB POTENTIAL: Good  CLINICAL DECISION MAKING: Evolving/moderate complexity  EVALUATION COMPLEXITY: Moderate  PLAN:  PT FREQUENCY: 1-2x/week  PT DURATION: 6 weeks  PLANNED INTERVENTIONS: Therapeutic exercises, Therapeutic activity, Neuromuscular re-education, Balance training, Gait training, Patient/Family education, Self Care, Joint mobilization, Stair training, Vestibular training, Canalith repositioning, DME instructions, Aquatic Therapy, Dry Needling, Electrical stimulation, Cryotherapy, Moist heat, Taping, Manual therapy, and Re-evaluation  PLAN FOR NEXT SESSION: Monitor spO2 during activity; progress hip strengthening and endurance    12:38 PM, 08/21/22 M. Sherlyn Lees, PT, DPT Physical Therapist- [place] Office Number: [phone]   [place] [facility] [address], [place], [place] [address] Phone # [phone] Fax # [phone]

## 2022-08-21 NOTE — Therapy (Signed)
OUTPATIENT SPEECH LANGUAGE PATHOLOGY TREAMENT   Patient Name: [name] MRN: [id number] DOB:02-Jun-1956, 66 y.o., male Today's Date: 08/21/2022  PCP: [name], MD REFERRING PROVIDER: [name], MD (referring),  [name], MD (documentation)  END OF SESSION:  End of Session - 08/21/22 1216     Visit Number 4    Number of Visits 17    Date for SLP Re-Evaluation 10/11/21    SLP Start Time 1103    SLP Stop Time  1143    SLP Time Calculation (min) 40 min    Activity Tolerance Patient tolerated treatment well                Past Medical History:  Diagnosis Date   Alopecia    s/p chemotherapy   Arthritis    Bursitis    pt. denies   Cancer ([place]) 10/2007    tongue/squamous cell,stg IV,HPV positive   Diabetes mellitus without complication ([place])    takes Amaryl,Januvia,and Metformin,daily   DVT, lower extremity ([place]) 2009   side effect from chemo and has blood clot left leg-wears compression stockings;was on Coumadin for 59month and has been off 5 yrs   History of chemotherapy    Cisplatin/Taxotere,5-FU   History of radiation therapy 01/28/08-03/11/08   left base tongue   History of shingles    Hyperlipidemia    takes Vytorin daily   Hypothyroidism    takes Synthroid daily   Joint pain    Joint swelling    Neuropathy    left foot   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 denies   Right kidney mass    Seborrheic keratosis    multiple on back   Past Surgical History:  Procedure Laterality Date   BIOPSY  07/07/2022   Procedure: BIOPSY;  Surgeon: VLoney Laurence DO;  [facility]  Service: Gastroenterology;;   CARDIAC CATHETERIZATION     CHEST TUBE INSERTION  07/06/2022   Procedure: CHEST TUBE INSERTION;  Surgeon: BCollene Gobble MD;  [facility];  Service: Cardiopulmonary;;   ESOPHAGOGASTRODUODENOSCOPY (EGD) WITH PROPOFOL N/A  07/07/2022   Procedure: ESOPHAGOGASTRODUODENOSCOPY (EGD) WITH PROPOFOL;  Surgeon: VLoney Laurence DO;  [facility]  Service: Gastroenterology;  Laterality: N/A;   feeding tube placed  2009 and then removed   [place] 08[place]   Procedure: LYMPH NODE DISSECTION;  Surgeon: BRaynelle Bring MD;  [facility];  Service: Urology;  Laterality: Right;   MOUTH SURGERY     PILONIDAL CYST / SINUS EXCISION  1974   port a cath placement     PORT-A-CATH REMOVAL  2010   right wrist fracture Right 2004   ROBOT ASSISTED LAPAROSCOPIC NEPHRECTOMY Right 08/05/2020   Procedure: XI ROBOTIC ASSISTED LAPAROSCOPIC RADICAL NEPHRECTOMY/PARTIAL ADRENALECTOMY;  Surgeon: BRaynelle Bring MD;  [facility];  Service: Urology;  Laterality: Right;   SHOULDER ARTHROSCOPY WITH DISTAL CLAVICLE RESECTION Right 10/26/2016   Procedure: SHOULDER ARTHROSCOPY WITH DISTAL CLAVICLE RESECTION;  Surgeon: KJustice Britain MD;  [facility]  Service: Orthopedics;  Laterality: Right;   SHOULDER ARTHROSCOPY WITH ROTATOR CUFF REPAIR AND SUBACROMIAL DECOMPRESSION Left 07/16/2014   Procedure: LEFT SHOULDER ARTHROSCOPY WITH ROTATOR CUFF REPAIR AND SUBACROMIAL DECOMPRESSION/DISTAL CLAVICLE RESECTION;  Surgeon: KMarin Shutter MD;  [facility]  Service: Orthopedics;  Laterality: Left;   SHOULDER ARTHROSCOPY WITH ROTATOR CUFF REPAIR  AND SUBACROMIAL DECOMPRESSION Right 10/26/2016   Procedure: RIGHT SHOULDER ARTHROSCOPY WITH ROTATOR CUFF REPAIR AND SUBACROMIAL DECOMPRESSION;  Surgeon: Justice Britain, MD;  Location: [facility];  Service: Orthopedics;  Laterality: Right;  requests 2hrs   teeth extraction #9     Patient Active Problem List   Diagnosis Date Noted   Acute respiratory failure with hypoxia ([place]) 07/11/2022   Leukocytosis 07/11/2022   Dysphagia 07/11/2022   Chronic kidney disease (CKD), stage III (moderate) ([facility]) 07/11/2022   Anemia of chronic disease 07/11/2022    Thrombocytosis 07/11/2022   Pleural effusion 07/06/2022   Aspiration pneumonia ([place]) 07/05/2022   AKI (acute kidney injury) ([place]) 08/04/2021   History of DVT (deep vein thrombosis) 08/04/2021   History of pulmonary embolus (PE) 50/11/7046   Acute metabolic acidosis 88/91/6945   DKA (diabetic ketoacidosis) ([place]) 08/03/2021   Elevated serum creatinine 04/06/2021   Renal cell carcinoma, right ([place]) 08/05/2020   Thyroid activity decreased    Acute pulmonary embolism ([place]) 12/07/2014   Diabetes mellitus type 2 in nonobese ([place]) 12/07/2014   Cancer ([place])    History of angioplasty    Depression    DVT, lower extremity ([place])    Osteoradionecrosis of mandible    Malignant neoplasm of base of tongue ([place]) 07/20/2011   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/20/2011   Pulmonary embolism ([place]) 07/20/2011    ONSET DATE: 2009 after head/neck radiation, script dated 07-14-22   REFERRING DIAG: J18.9 (ICD-10-CM) - Community acquired pneumonia of left lung, unspecified part of lung   THERAPY DIAG:  Dysphagia, pharyngeal phase  Rationale for Evaluation and Treatment: Rehabilitation  SUBJECTIVE:   SUBJECTIVE STATEMENT: "I think it's even better than it was last time I was here." Pt accompanied by: self  PERTINENT HISTORY: 66 y.o. male presented with worsening shortness of breath and cough. Chest CT 11/1: "Interval worsening of diffuse bronchial wall thickening with  right lower lobe and right middle lobe development of atypical  infection." OP MBS 05/12/2022 with silent aspiration of all consistencies of liquid.  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  Visited  ENT on 03/08/2022 with history as follows: Pt "had base of tongue cancer in 2009 treated with radiation and chemotherapy with cure. He had kidney cancer in 2021. He has noticed more hoarseness with more talking over the past six months. Bulkier foods do not pass very well with swallowing. He coughs a lot when eating. He is able to swallow liquids fine." Pt has a history of left vocal cord paralysis (decreased movmenet of left VF compared to right, good glottic closure, no muscle tension patterns, laryngeal edema is minimal on fiberoptic exam) and tongue cancer. CT on 03/23/22 shows no mass, asymmetric glottic consistent with history of paralysis. No mass on reccurent laryngeal nerve. Pt underwent esophagram on 03/15/22, ordered by Dr Redmond Baseman due to dysphagia. Pt aspirated thick barium and study was terminated. MBS recommended by radiologist.  PAIN:  Are you having pain? No  FALLS: Has patient fallen in last 6 months?  No  LIVING ENVIRONMENT: Lives with: lives with their spouse Lives in: House/apartment  PLOF:  Level of assistance: Independent with ADLs Employment: Retired  PATIENT GOALS: Improve swallowing  OBJECTIVE:   DIAGNOSTIC FINDINGS:  DG CHEST  07/05/22  IMPRESSION: There is interval appearance of large area of opacification in the left mid and left lower lung fields. Findings suggest pneumonia and large effusion. Part of the effusion appears to be loculated along the lateral chest wall. Possibility of empyema is not excluded. Follow-up CT may be considered.     RECOMMENDATIONS FROM OBJECTIVE SWALLOW STUDY (MBSS/FEES):   Objective swallow impairments: Pt demonstrates an oropharyngeal dysphagia characterized by decreased mobility across pharyngeal structures that leads to mild silent aspiration of liquids, regardless of texture. Pt can achieve adequate glottic closure at the height of the swallow, but epiglottic deflection, reduced laryngeal elevation and reduced UES opening lead to  penetration and residue that falls into the airway just as glottis opens. SLP attempted a variety of compenstory strategies including head turn left, head tilt right, chin tuck, supraglottic swallow. None prevented aspiration. A super-superglotttic swallow may be beneficial, mostly because pt can eject most aspirate with a cued cough and throat clear. With solids there is mild to moderate residue in pyriforms and valleculae due to the the same weakness. Pt usually uses a liquid wash to clear. This is likely the cause of pts globus sensation. A chin tuck with solids did clear a bit more of the stasis than an upright swallow. Effortful swallow not effective. Provided visual feedback to pt and provided extensive education and recommendations post session. Would not recommend any texture modification as there is no beenfit to thickened liquids for this pt. Futhermore, pt has not had any recent repiratory infection that would warrant excessive precaution. However, recommended pt: 1. trial a chin tuck with solids, 2. cough and clear throat frequently with intake even if he doesnt feel the need to do so, 3. complete thorough oral care to reduce bacterial load with aspiration, 4. inform providers of dysphagia and 5. consider f/u with SLP for home exercise program. 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 Exercise and tx targeting pharyngeal musculature could be helpful. Objective recommended compensations:  PRECAUTIONS: Slow rate;Small sips/bites;Follow solids with liquid;Clear throat intermittently;Clear throat after each swallow;Chin tuck BEDSIDE SWALLOW EVAL RESULTS NOVEMBER 2023: Clinical Impression  Pt presents with clinical indicators of pharyngeal dysphagia with known hx of chronic, silent aspiration s/p rad tx for oropharyngeal CA 2009.  Pt completed OP MBSS on 05/12/2022 revealing "oropharyngeal dysphagia characterized by decreased  mobility across pharyngeal structures that leads to mild silent aspiration of liquids, regardless of texture." Recommendations at that time to continue regular diet with thin liquids as modified consistencies were of no benefit.  Aspiration precaution recommendations from MBS as follows: " 1. trial a chin tuck with solids, 2. cough and clear throat frequently with intake even if he doesnt feel the need to do so, 3. complete thorough oral care to reduce bacterial load with aspiration, 4. inform providers of dysphagia and 5. consider f/u with SLP for home exercise program."  These were reviewed with pt.  Pt reports that he did not have any speech therapy follow up after this assessment, but he is agreeable to trial of swallow exercise program in house.  Pt will likely require 6-8 weeks of intensive swallow exercise program to effect change in swallow function and should follow with OP SLP for continued dysphagia management.  Pt with hx VF paralysis.  This was never directly addressed.  Pt's vocal quality is strong and clear in conversation. Today pt exhibited intermittent throat clear with thin liquid and regular  solids.  On palpation, laryngeal elevation was reduced.  Pt achieved greater hyolaryngeal excursion with a sip of water than with voluntary swallow without bolus trial (dry swallow).  Pt notes that soft foods (e.g. applesauce) are easier to eat for him.  There were no clinical s/s of puree.  Even in the absence of clinical s/s of aspiration, there is still a risk for prandial aspiration given hx of silent aspiration of liquid.  Pt would like to resume regular texture diet with thin liquid. He does still c/o of feeling of stasis with solid textures regularly and may benefit from further assessment and possible treatment for esophageal narrowing.  He states he would like to address the feelings of stasis with PO intake.  Consider GI consult if pt is a candidate for espohageal dilation.  Pt likely does not need  a repeat instrumental swallow evaluation at this time.  It is unlikely that swallow function has changed since September, and pt would like to continue oral diet at this time.   Diet Recommendation Regular;Thin liquid  Liquid Administration via: Cup;Straw Medication Administration:  (As tolerated) Supervision: Patient able to self feed  Compensations:  Slow rate; Small sips/bites; Chin tuck with solids; Follow solids with liquid; Clear throat regularly   PATIENT REPORTED OUTCOME MEASURES (PROM): EAT-10: administered today and pt scored 23/30 (lower scores indicate greater QOL from swallowing). "4" was answered for "I cough when I eat", and "swallowing is stressful".   TODAY'S TREATMENT:                                                                                                                                         DATE:  08/21/22: "I tucked my chin and my daughter in law asked me, 'Are you ok?' I told her yeah, I'm just eating." Pt ate dys III and drank H2O via cup today and followed precautions with independence. HEP was performed with independence today. If pt's performance remains the same after next session he can decr frequency to once/week.   12:14/23: "The last few days I haven't been spitting like I used to." Pt also reports wife notes he doesn't cough as much during meals. Pt req'd initial mod A for super-supraglottic faded to modified independent. Pt was reminded to get at least 7 reps of each exercise and his target is 30/daily.  08/11/22: Pt without hydrophonic voice today upon entry to [place]. Pt req'd cues for super-supraglottic, but was indpendent with other 2 exercises. SLP provided pt with overt s/sx aspiration PNA - pt told SLP 3 overt s/sx. With POs, pt followed precautions 100%.   08/02/22: SLP reviewed MBS with pt from September and precautions with POs. Pt req'd cues for alternating bite/sip and clear throat intermittently (SLP encouraged pt to clear throat and  reswallow after every bite of solids). By session end pt was modified independent with precautions (handout from SLP being used).  SLP then educated pt about three swallow exercises: effortful swallow, Mendelsohn, and Super-supraglottic. Pt was mod A usually, faded to modified independent with all exercises. He was told he will need to do these until 10-05-22 and then another MBS will likely be recommended.   PATIENT EDUCATION: Education details: see "today's treatment" Person educated: Patient Education method: Explanation, Demonstration, Verbal cues, and Handouts Education comprehension: verbalized understanding, returned demonstration, verbal cues required, and needs further education   ASSESSMENT:  CLINICAL IMPRESSION: Patient is a 66 y.o. male who was seen today for treatment of swallowing precautions and of HEP. Pt cont to progress with HEP and with precautions. SEE TX NOTE for more details. Pt reports his recent GI exam revealed he did NOT require esophageal dilitation/dilation. He will be able to reduce to once/week next session if progress is consistent.  OBJECTIVE IMPAIRMENTS: include dysphagia. These impairments are limiting patient from safety when swallowing. Factors affecting potential to achieve goals and functional outcome are previous level of function and severity of impairments. Patient will benefit from skilled SLP services to address above impairments and improve overall function.  REHAB POTENTIAL: Fair given above factors   GOALS: Goals reviewed with patient? Yes  SHORT TERM GOALS: Target date: 09-10-22  Pt will complete HEP with rare min A in two sessions Baseline: 08-11-22 Goal status: met  2.  Pt will follow aspiration precautions from [facility] 05-12-22 with rare min A in 2 sessions Baseline: 08-11-22 Goal status: met  3.  Pt will tell SLP 3 overt s/sx of aspiration PNA with modified independence Baseline:  Goal status: met  4.  Pt will complete PROM in first two  therapy sessions Baseline:  Goal status: met    LONG TERM GOALS: Target date: 10-11-22  Pt will follow aspiration precautions from [facility] 05-12-22 with modified independence in 2 sessions Baseline: 08-11-22 Goal status: met  2.  Pt will complete HEP with modified independence in three sessions Baseline: 08/21/22 Goal status: ongoing  3.  Pt will achieve improvement in measurement of PROM in last 1-2 sessions Baseline:  Goal status: ongoing  4.  Pt will undergo follow up MBS to assess progress Baseline:  Goal status: ongoing   PLAN:  SLP FREQUENCY: 2x/week  SLP DURATION:  17 sessions until 10-11-21  PLANNED INTERVENTIONS: Aspiration precaution training, Pharyngeal strengthening exercises, Diet toleration management , Environmental controls, Trials of upgraded texture/liquids, Internal/external aids, SLP instruction and feedback, Compensatory strategies, and Patient/family education    [facility], [place] 08/21/2022, 12:16 PM

## 2022-08-22 NOTE — Therapy (Signed)
OUTPATIENT PHYSICAL THERAPY NEURO TREATMENT   Patient Name: [name] MRN: [id number] DOB:Sep 24, 1955, 66 y.o., male Today's Date: 08/23/2022   PCP: [name], MD  REFERRING PROVIDER: [name], MD  END OF SESSION:  PT End of Session - 08/23/22 1009     Visit Number 5    Number of Visits 13    Date for PT Re-Evaluation 09/13/22    Authorization Type [facility]    Authorization - Visit Number 5    Authorization - Number of Visits 25    PT Start Time 0929    PT Stop Time 1008    PT Time Calculation (min) 39 min    Equipment Utilized During Treatment Gait belt    Activity Tolerance Patient tolerated treatment well;Patient limited by fatigue    Behavior During Therapy WFL for tasks assessed/performed                Past Medical History:  Diagnosis Date   Alopecia    s/p chemotherapy   Arthritis    Bursitis    pt. 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,stg IV,HPV positive   Diabetes mellitus without complication ([place])    takes Amaryl,Januvia,and Metformin,daily   DVT, lower extremity ([place]) 2009   side effect from chemo and has blood clot left leg-wears compression stockings;was on Coumadin for 23months and has been off 5 yrs   History of chemotherapy    Cisplatin/Taxotere,5-FU   History of radiation therapy 01/28/08-03/11/08   left base tongue   History of shingles    Hyperlipidemia    takes Vytorin daily   Hypothyroidism    takes Synthroid daily   Joint pain    Joint swelling    Neuropathy    left foot   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 denies   Right kidney mass    Seborrheic keratosis    multiple on back   Past Surgical History:  Procedure Laterality Date   BIOPSY  07/07/2022   Procedure: BIOPSY;  Surgeon: Loney Laurence, DO;  Location: [place];  Service: Gastroenterology;;   CARDIAC CATHETERIZATION     CHEST TUBE INSERTION   07/06/2022   Procedure: CHEST TUBE INSERTION;  Surgeon: Collene Gobble, MD;  Location: [facility];  Service: Cardiopulmonary;;   ESOPHAGOGASTRODUODENOSCOPY (EGD) WITH PROPOFOL N/A 07/07/2022   Procedure: ESOPHAGOGASTRODUODENOSCOPY (EGD) WITH PROPOFOL;  Surgeon: Loney Laurence, DO;  Location: [place];  Service: Gastroenterology;  Laterality: N/A;   feeding tube placed  2009 and then removed   [place] Right 08/05/2020   Procedure: LYMPH NODE DISSECTION;  Surgeon: Raynelle Bring, MD;  Location: [facility];  Service: Urology;  Laterality: Right;   MOUTH SURGERY     PILONIDAL CYST / SINUS EXCISION  1974   port a cath placement     PORT-A-CATH REMOVAL  2010   right wrist fracture Right 2004   ROBOT ASSISTED LAPAROSCOPIC NEPHRECTOMY Right 08/05/2020   Procedure: XI ROBOTIC ASSISTED LAPAROSCOPIC RADICAL NEPHRECTOMY/PARTIAL ADRENALECTOMY;  Surgeon: Raynelle Bring, MD;  Location: [facility];  Service: Urology;  Laterality: Right;   SHOULDER ARTHROSCOPY WITH DISTAL CLAVICLE RESECTION Right 10/26/2016   Procedure: SHOULDER ARTHROSCOPY WITH DISTAL CLAVICLE RESECTION;  Surgeon: Justice Britain, MD;  Location: [place];  Service: Orthopedics;  Laterality: Right;   SHOULDER ARTHROSCOPY WITH ROTATOR CUFF REPAIR AND SUBACROMIAL DECOMPRESSION Left  07/16/2014   Procedure: LEFT SHOULDER ARTHROSCOPY WITH ROTATOR CUFF REPAIR AND SUBACROMIAL DECOMPRESSION/DISTAL CLAVICLE RESECTION;  Surgeon: Marin Shutter, MD;  Location: [place];  Service: Orthopedics;  Laterality: Left;   SHOULDER ARTHROSCOPY WITH ROTATOR CUFF REPAIR AND SUBACROMIAL DECOMPRESSION Right 10/26/2016   Procedure: RIGHT SHOULDER ARTHROSCOPY WITH ROTATOR CUFF REPAIR AND SUBACROMIAL DECOMPRESSION;  Surgeon: Justice Britain, MD;  Location: [place];  Service: Orthopedics;  Laterality: Right;  requests 2hrs   teeth extraction #9     Patient Active Problem List   Diagnosis Date Noted   Acute respiratory failure with hypoxia ([place])  07/11/2022   Leukocytosis 07/11/2022   Dysphagia 07/11/2022   Chronic kidney disease (CKD), stage III (moderate) (HCC) 07/11/2022   Anemia of chronic disease 07/11/2022   Thrombocytosis 07/11/2022   Pleural effusion 07/06/2022   Aspiration pneumonia ([place]) 07/05/2022   AKI (acute kidney injury) ([place]) 08/04/2021   History of DVT (deep vein thrombosis) 08/04/2021   History of pulmonary embolus (PE) 24/26/8341   Acute metabolic acidosis 96/22/2979   DKA (diabetic ketoacidosis) ([place]) 08/03/2021   Elevated serum creatinine 04/06/2021   Renal cell carcinoma, right ([place]) 08/05/2020   Thyroid activity decreased    Acute pulmonary embolism ([place]) 12/07/2014   Diabetes mellitus type 2 in nonobese ([place]) 12/07/2014   Cancer ([place])    History of angioplasty    Depression    DVT, lower extremity ([place])    Osteoradionecrosis of mandible    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/20/2011   Pulmonary embolism ([place]) 07/20/2011    ONSET DATE: 07/05/22  REFERRING DIAG: J18.9 (ICD-10-CM) - Community acquired pneumonia of left lung, unspecified part of lung  THERAPY DIAG:  Muscle weakness (generalized)  Other abnormalities of gait and mobility  Difficulty in walking, not elsewhere classified  Rationale for Evaluation and Treatment: Rehabilitation  SUBJECTIVE:                                                                                                                                                                                             SUBJECTIVE STATEMENT: Knees are sore after kneeling to work on the bathroom grout.   Pt accompanied by: self  PERTINENT HISTORY: DM, HLD, neuropathy, R RTC repair, stage 4 tongue/squamous cell CA, R renal CA  PAIN:  Are you having pain? Yes: NPRS scale: 3-4/10 Pain location: B knees Pain description: sore Aggravating factors: kneeling Relieving factors:  nothing  PRECAUTIONS: Other: per patient, aspiration precautions- no bread, no crackers, thin liquids okay Per fax from Dr. Harrington Challenger 08/08/22 "pt tachy all  the time ok to treat if pulse >100."  WEIGHT BEARING RESTRICTIONS: No  FALLS: Has patient fallen in last 6 months? 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  PLOF: Independent; retired; would like to return to hunting, fishing, woodworking   PATIENT GOALS: would like to return to hunting, fishing, woodworking   OBJECTIVE:     TODAY'S TREATMENT: 08/23/22 Activity Comments  Vitals  93% 100bpm   Nustep L5 x 7 min Les only  Cueing for pacing; 95% spO2, 109 bpm after warmup  STS sitting on airex + blue medball 2x10  21 inch mat; cues to scoot forward and stand fully upright upon standing   alt step ups on 6" step 3x30"  Without UE support and cueing to maintain alternating pattern  step up + opposite SKTC 10x each LE Much improved stability today without UE support; after completing exercise vitals 89% and improved to 97% spO2 and 100 bpm after sitting rest break  farmer's carry 10#; 3 min each hand Cueing to stand taller and correct rounded posture; 96% spO2, 109 BPM       HOME EXERCISE PROGRAM Last updated: 08/11/22 Access Code: [email or web address] URL: [email or web address] Date: 08/11/2022 Prepared by: [facility]  Exercises - Sit to Stand with Counter Support  - 1 x daily - 5 x weekly - 3 sets - 10 reps - Gastroc Stretch on Wall  - 1 x daily - 5 x weekly - 2 sets - 30 hold - Marching with Resistance  - 1 x daily - 5 x weekly - 2 sets - 10 reps - Standing Hip Abduction with Resistance at Ankles and Counter Support  - 1 x daily - 5 x weekly - 2 sets - 10 reps - Standing Hip Extension with Resistance at Ankles and Counter Support   - 1 x daily - 5 x weekly - 2 sets - 10 reps   Below measures were taken at time of initial evaluation unless otherwise specified:   DIAGNOSTIC FINDINGS: 07/24/22 chest xray: Similar to slightly increased loculated left hydropneumothorax. Trace thickening of the right inferolateral pleura may represent trace pleural effusion. Similar asymmetrically low left lung volumes with left basilar patchy opacities, likely atelectasis.  COGNITION: Overall cognitive status: Within functional limits for tasks assessed   SENSATION: Reports a little N/T in B feet from neuropathy  COORDINATION: Alternating pronation/supination: WNL B Alternating toe tap: WNL B Finger to nose: WNL B    POSTURE: rounded shoulders, forward head, and increased thoracic kyphosis  LOWER EXTREMITY ROM:     Active  Right Eval Left Eval  Hip flexion    Hip extension    Hip abduction    Hip adduction    Hip internal rotation    Hip external rotation    Knee flexion    Knee extension    Ankle dorsiflexion 5 5  Ankle plantarflexion    Ankle inversion    Ankle eversion     (Blank rows = not tested)  LOWER EXTREMITY MMT:    MMT (in sitting) Right Eval Left Eval  Hip flexion 4 4-  Hip extension    Hip abduction 4+ 4+  Hip adduction 4- 4  Hip internal rotation    Hip external rotation    Knee flexion 5 4+  Knee extension 5 5  Ankle dorsiflexion 4+ 4+  Ankle plantarflexion 4+ 4+  Ankle inversion    Ankle eversion    (Blank rows = not tested)   GAIT: Gait pattern: Good gait speed and step length but with L lateral trunk lean and thoracic kyphosis  Assistive device utilized: None Level of assistance: Complete Independence  FUNCTIONAL TESTS:        TODAY'S TREATMENT:                                                                                                                              DATE: 08/02/22    PATIENT EDUCATION: Education details: prognosis, POC, HEP, edu on exam findings   Person educated: Patient Education method: Explanation, Demonstration, Tactile cues, Verbal cues, and Handouts Education comprehension: verbalized understanding  HOME EXERCISE PROGRAM: Access Code: [email or web address] URL: [email or web address] Date: 08/02/2022 Prepared by: [facility]  Exercises - Sit to Stand with Counter Support  - 1 x daily - 5 x weekly - 3 sets - 10 reps - Gastroc Stretch on Wall  - 1 x daily - 5 x weekly - 2 sets - 30 hold - Standing Hip Abduction with Counter Support  - 1 x daily - 5 x weekly - 2 sets - 10 reps - Standing Hip Extension with Counter Support  - 1 x daily - 5 x weekly - 2 sets - 10 reps   GOALS: Goals reviewed with patient? Yes  SHORT TERM GOALS: Target date: 08/23/2022  Patient to be independent with initial HEP. Baseline: HEP initiated Goal status: IN PROGRESS    LONG TERM GOALS: Target date: 09/13/2022  Patient to be independent with advanced HEP. Baseline: Not yet initiated  Goal status: IN PROGRESS  Patient to demonstrate B LE strength >/=4+/5.  Baseline: See above Goal status: IN PROGRESS  Patient to demonstrate 10 degrees B ankle dorsiflexion AROM WFL in order to improve efficiency with gait. Baseline: 5 deg B Goal status: IN PROGRESS  Patient to report tolerance for walking for 45 minutes without fatigue limiting.  Baseline: unable Goal status: IN PROGRESS  Patient to demonstrate 5xSTS test without UE support in <15 sec in order to decrease risk of falls.  Baseline: 13 sec with armrests  Goal status: IN PROGRESS  Patient to walk at least 1312 feet during 6 minute walk test in order to score closer to age-matched norms.  Baseline: 1164 ft 08/11/22 Goal status: IN PROGRESS 08/11/22   ASSESSMENT:  CLINICAL IMPRESSION: Patient arrived to session with report some B knee soreness after kneeling while working in the bathroom. Vitals and symptoms were monitored throughout session  today. Continued working on STS transfers from elevated surface with weighted resistance. Patient demonstrated slightly improved eccentric control but required cues for set up and posture. Improved stability evident with stair activities; patient fatigued after completing these tasks; required sitting rest break to address fatigue and normalize vitals. No complaints at end of  session. Patient is progressing well towards goals.     OBJECTIVE IMPAIRMENTS: Abnormal gait, decreased activity tolerance, decreased endurance, decreased ROM, decreased strength, dizziness, impaired flexibility, improper body mechanics, and postural dysfunction.   ACTIVITY LIMITATIONS: carrying, lifting, bending, standing, squatting, stairs, bathing, and dressing  PARTICIPATION LIMITATIONS: meal prep, cleaning, laundry, shopping, community activity, yard work, and church  PERSONAL FACTORS: Age, Fitness, Past/current experiences, Time since onset of injury/illness/exacerbation, and 3+ comorbidities: DM, HLD, neuropathy, R RTC repair, stage 4 tongue/squamous cell CA, R renal CA  are also affecting patient's functional outcome.   REHAB POTENTIAL: Good  CLINICAL DECISION MAKING: Evolving/moderate complexity  EVALUATION COMPLEXITY: Moderate  PLAN:  PT FREQUENCY: 1-2x/week  PT DURATION: 6 weeks  PLANNED INTERVENTIONS: Therapeutic exercises, Therapeutic activity, Neuromuscular re-education, Balance training, Gait training, Patient/Family education, Self Care, Joint mobilization, Stair training, Vestibular training, Canalith repositioning, DME instructions, Aquatic Therapy, Dry Needling, Electrical stimulation, Cryotherapy, Moist heat, Taping, Manual therapy, and Re-evaluation  PLAN FOR NEXT SESSION: Monitor spO2 during activity; progress hip strengthening and endurance    Janene Harvey, PT, DPT 08/23/22 10:10 AM  [facility] at [facility] [address], [place], [place]  [address] Phone # [phone] Fax # [phone]

## 2022-08-23 ENCOUNTER — Ambulatory Visit: Payer: 59 | Admitting: Physical Therapy

## 2022-08-23 ENCOUNTER — Ambulatory Visit: Payer: 59

## 2022-08-23 ENCOUNTER — Encounter: Payer: Self-pay | Admitting: Physical Therapy

## 2022-08-23 DIAGNOSIS — R262 Difficulty in walking, not elsewhere classified: Secondary | ICD-10-CM

## 2022-08-23 DIAGNOSIS — R2689 Other abnormalities of gait and mobility: Secondary | ICD-10-CM | POA: Diagnosis not present

## 2022-08-23 DIAGNOSIS — M6281 Muscle weakness (generalized): Secondary | ICD-10-CM | POA: Diagnosis not present

## 2022-08-23 DIAGNOSIS — R1313 Dysphagia, pharyngeal phase: Secondary | ICD-10-CM | POA: Diagnosis not present

## 2022-08-23 NOTE — Therapy (Signed)
OUTPATIENT SPEECH LANGUAGE PATHOLOGY TREAMENT   Patient Name: [name] MRN: [id number] DOB:[birth year], 66 y.o., male Today's Date: 08/23/2022  PCP: [name], MD REFERRING PROVIDER: [name], MD (referring),  [name], MD (documentation)  END OF SESSION:       Past Medical History:  Diagnosis Date   Alopecia    s/p chemotherapy   Arthritis    Bursitis    pt. denies   Cancer ([place]) 10/2007    tongue/squamous cell,stg IV,HPV positive   Diabetes mellitus without complication ([place])    takes Amaryl,Januvia,and Metformin,daily   DVT, lower extremity ([place]) 2009   side effect from chemo and has blood clot left leg-wears compression stockings;was on Coumadin for 37month and has been off 5 yrs   History of chemotherapy    Cisplatin/Taxotere,5-FU   History of radiation therapy 01/28/08-03/11/08   left base tongue   History of shingles    Hyperlipidemia    takes Vytorin daily   Hypothyroidism    takes Synthroid daily   Joint pain    Joint swelling    Neuropathy    left foot   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 denies   Right kidney mass    Seborrheic keratosis    multiple on back   Past Surgical History:  Procedure Laterality Date   BIOPSY  07/07/2022   Procedure: BIOPSY;  Surgeon: VLoney Laurence DO;  [facility]  Service: Gastroenterology;;   CARDIAC CATHETERIZATION     CHEST TUBE INSERTION  07/06/2022   Procedure: CHEST TUBE INSERTION;  Surgeon: BCollene Gobble MD;  [facility];  Service: Cardiopulmonary;;   ESOPHAGOGASTRODUODENOSCOPY (EGD) WITH PROPOFOL N/A 07/07/2022   Procedure: ESOPHAGOGASTRODUODENOSCOPY (EGD) WITH PROPOFOL;  Surgeon: VLoney Laurence DO;  [facility]  Service: Gastroenterology;  Laterality: N/A;   feeding tube placed  2009 and then removed   [place]  08/05/2020   Procedure: LYMPH NODE DISSECTION;  Surgeon: BRaynelle Bring MD;  [facility]: [facility];  Service: Urology;  Laterality: Right;   MOUTH SURGERY     PILONIDAL CYST / SINUS EXCISION  1974   port a cath placement     PORT-A-CATH REMOVAL  2010   right wrist fracture Right 2004   ROBOT ASSISTED LAPAROSCOPIC NEPHRECTOMY Right 08/05/2020   Procedure: XI ROBOTIC ASSISTED LAPAROSCOPIC RADICAL NEPHRECTOMY/PARTIAL ADRENALECTOMY;  Surgeon: BRaynelle Bring MD;  [facility];  Service: Urology;  Laterality: Right;   SHOULDER ARTHROSCOPY WITH DISTAL CLAVICLE RESECTION Right 10/26/2016   Procedure: SHOULDER ARTHROSCOPY WITH DISTAL CLAVICLE RESECTION;  Surgeon: KJustice Britain MD;  [facility]  Service: Orthopedics;  Laterality: Right;   SHOULDER ARTHROSCOPY WITH ROTATOR CUFF REPAIR AND SUBACROMIAL DECOMPRESSION Left 07/16/2014   Procedure: LEFT SHOULDER ARTHROSCOPY WITH ROTATOR CUFF REPAIR AND SUBACROMIAL DECOMPRESSION/DISTAL CLAVICLE RESECTION;  Surgeon: KMarin Shutter MD;  [facility]  Service: Orthopedics;  Laterality: Left;   SHOULDER ARTHROSCOPY WITH ROTATOR CUFF REPAIR AND SUBACROMIAL DECOMPRESSION Right 10/26/2016   Procedure: RIGHT SHOULDER ARTHROSCOPY WITH ROTATOR CUFF REPAIR AND SUBACROMIAL DECOMPRESSION;  Surgeon: KJustice Britain MD;  [facility]  Service: Orthopedics;  Laterality: Right;  requests 2hrs   teeth extraction #9     Patient Active Problem List   Diagnosis Date Noted   Acute respiratory failure with hypoxia ([place] 07/11/2022   Leukocytosis 07/11/2022   Dysphagia 07/11/2022  Chronic kidney disease (CKD), stage III (moderate) (HCC) 07/11/2022   Anemia of chronic disease 07/11/2022   Thrombocytosis 07/11/2022   Pleural effusion 07/06/2022   Aspiration pneumonia ([place]) 07/05/2022   AKI (acute kidney injury) ([place]) 08/04/2021   History of DVT (deep vein thrombosis) 08/04/2021   History of pulmonary embolus (PE) 78/24/2353   Acute metabolic acidosis 61/44/3154    DKA (diabetic ketoacidosis) ([place]) 08/03/2021   Elevated serum creatinine 04/06/2021   Renal cell carcinoma, right ([place]) 08/05/2020   Thyroid activity decreased    Acute pulmonary embolism ([place]) 12/07/2014   Diabetes mellitus type 2 in nonobese ([place]) 12/07/2014   Cancer ([place])    History of angioplasty    Depression    DVT, lower extremity ([place])    Osteoradionecrosis of mandible    Malignant neoplasm of base of tongue ([place]) 07/20/2011   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/20/2011   Pulmonary embolism ([place]) 07/20/2011    ONSET DATE: 2009 after head/neck radiation, script dated 07-14-22   REFERRING DIAG: J18.9 (ICD-10-CM) - Community acquired pneumonia of left lung, unspecified part of lung   THERAPY DIAG:  Dysphagia, pharyngeal phase  Rationale for Evaluation and Treatment: Rehabilitation  SUBJECTIVE:   SUBJECTIVE STATEMENT: "I'm not spitting maybe 10% of what I was before coming." Pt accompanied by: self  PERTINENT HISTORY: 66 y.o. male presented with worsening shortness of breath and cough. Chest CT 11/1: "Interval worsening of diffuse bronchial wall thickening with  right lower lobe and right middle lobe development of atypical  infection." OP MBS 05/12/2022 with silent aspiration of all consistencies of liquid.  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  Visited ENT on 03/08/2022 with history as follows: Pt "had base of tongue cancer in 2009 treated with radiation and chemotherapy with cure. He had kidney cancer in 2021. He has noticed more hoarseness with more talking over the past six months. Bulkier foods do not pass very well with swallowing. He  coughs a lot when eating. He is able to swallow liquids fine." Pt has a history of left vocal cord paralysis (decreased movmenet of left VF compared to right, good glottic closure, no muscle tension patterns, laryngeal edema is minimal on fiberoptic exam) and tongue cancer. CT on 03/23/22 shows no mass, asymmetric glottic consistent with history of paralysis. No mass on reccurent laryngeal nerve. Pt underwent esophagram on 03/15/22, ordered by Dr Redmond Baseman due to dysphagia. Pt aspirated thick barium and study was terminated. MBS recommended by radiologist.  PAIN:  Are you having pain? Yes: NPRS scale: 4/10 Pain location: bil knees Pain description: sore Aggravating factors: kneeling Relieving factors: walking/stretching   PLOF:  Level of assistance: Independent with ADLs Employment: Retired  PATIENT GOALS: Improve swallowing  OBJECTIVE:   DIAGNOSTIC FINDINGS:  DG CHEST 07/05/22  IMPRESSION: There is interval appearance of large area of opacification in the left mid and left lower lung fields. Findings suggest pneumonia and large effusion. Part of the effusion appears to be loculated along the lateral chest wall. Possibility of empyema is not excluded. Follow-up CT may be considered.     RECOMMENDATIONS FROM OBJECTIVE SWALLOW STUDY (MBSS/FEES):   Objective swallow impairments: Pt demonstrates an oropharyngeal dysphagia characterized by decreased mobility across pharyngeal structures that leads to  mild silent aspiration of liquids, regardless of texture. Pt can achieve adequate glottic closure at the height of the swallow, but epiglottic deflection, reduced laryngeal elevation and reduced UES opening lead to penetration and residue that falls into the airway just as glottis opens. SLP attempted a variety of compenstory strategies including head turn left, head tilt right, chin tuck, supraglottic swallow. None prevented aspiration. A super-superglotttic swallow may be beneficial, mostly because pt  can eject most aspirate with a cued cough and throat clear. With solids there is mild to moderate residue in pyriforms and valleculae due to the the same weakness. Pt usually uses a liquid wash to clear. This is likely the cause of pts globus sensation. A chin tuck with solids did clear a bit more of the stasis than an upright swallow. Effortful swallow not effective. Provided visual feedback to pt and provided extensive education and recommendations post session. Would not recommend any texture modification as there is no beenfit to thickened liquids for this pt. Futhermore, pt has not had any recent repiratory infection that would warrant excessive precaution. However, recommended pt: 1. trial a chin tuck with solids, 2. cough and clear throat frequently with intake even if he doesnt feel the need to do so, 3. complete thorough oral care to reduce bacterial load with aspiration, 4. inform providers of dysphagia and 5. consider f/u with SLP for home exercise program. 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 Exercise and tx targeting pharyngeal musculature could be helpful. Objective recommended compensations:  PRECAUTIONS: Slow rate;Small sips/bites;Follow solids with liquid;Clear throat intermittently;Clear throat after each swallow;Chin tuck BEDSIDE SWALLOW EVAL RESULTS NOVEMBER 2023: Clinical Impression  Pt presents with clinical indicators of pharyngeal dysphagia with known hx of chronic, silent aspiration s/p rad tx for oropharyngeal CA 2009.  Pt completed OP MBSS on 05/12/2022 revealing "oropharyngeal dysphagia characterized by decreased mobility across pharyngeal structures that leads to mild silent aspiration of liquids, regardless of texture." Recommendations at that time to continue regular diet with thin liquids as modified consistencies were of no benefit.  Aspiration precaution recommendations from MBS as follows: " 1.  trial a chin tuck with solids, 2. cough and clear throat frequently with intake even if he doesnt feel the need to do so, 3. complete thorough oral care to reduce bacterial load with aspiration, 4. inform providers of dysphagia and 5. consider f/u with SLP for home exercise program."  These were reviewed with pt.  Pt reports that he did not have any speech therapy follow up after this assessment, but he is agreeable to trial of swallow exercise program in house.  Pt will likely require 6-8 weeks of intensive swallow exercise program to effect change in swallow function and should follow with OP SLP for continued dysphagia management.  Pt with hx VF paralysis.  This was never directly addressed.  Pt's vocal quality is strong and clear in conversation. Today pt exhibited intermittent throat clear with thin liquid and regular solids.  On palpation, laryngeal elevation was reduced.  Pt achieved greater hyolaryngeal excursion with a sip of water than with voluntary swallow without bolus trial (dry swallow).  Pt notes that soft foods (e.g. applesauce) are easier to eat for him.  There were no clinical s/s of puree.  Even in the absence of clinical s/s of aspiration, there is still a risk for prandial aspiration given hx of silent aspiration of liquid.  Pt would like to resume  regular texture diet with thin liquid. He does still c/o of feeling of stasis with solid textures regularly and may benefit from further assessment and possible treatment for esophageal narrowing.  He states he would like to address the feelings of stasis with PO intake.  Consider GI consult if pt is a candidate for espohageal dilation.  Pt likely does not need a repeat instrumental swallow evaluation at this time.  It is unlikely that swallow function has changed since September, and pt would like to continue oral diet at this time.   Diet Recommendation Regular;Thin liquid  Liquid Administration via: Cup;Straw Medication Administration:  (As  tolerated) Supervision: Patient able to self feed  Compensations:  Slow rate; Small sips/bites; Chin tuck with solids; Follow solids with liquid; Clear throat regularly   PATIENT REPORTED OUTCOME MEASURES (PROM): EAT-10: administered today and pt scored 23/30 (lower scores indicate greater QOL from swallowing). "4" was answered for "I cough when I eat", and "swallowing is stressful".   TODAY'S TREATMENT:                                                                                                                                         DATE:  08/23/22: Pt ate dys III and drank H2O via cup today and followed precautions with independence.Pt was independent with HEP. He will reduce to once/week beginning next week.   08/21/22: "I tucked my chin and my daughter in law asked me, 'Are you ok?' I told her yeah, I'm just eating." Pt ate dys III and drank H2O via cup today and followed precautions with independence. HEP was performed with independence today. If pt's performance remains the same after next session he can decr frequency to once/week.   12:14/23: "The last few days I haven't been spitting like I used to." Pt also reports wife notes he doesn't cough as much during meals. Pt req'd initial mod A for super-supraglottic faded to modified independent. Pt was reminded to get at least 7 reps of each exercise and his target is 30/daily.  08/11/22: Pt without hydrophonic voice today upon entry to [place]. Pt req'd cues for super-supraglottic, but was indpendent with other 2 exercises. SLP provided pt with overt s/sx aspiration PNA - pt told SLP 3 overt s/sx. With POs, pt followed precautions 100%.   08/02/22: SLP reviewed MBS with pt from September and precautions with POs. Pt req'd cues for alternating bite/sip and clear throat intermittently (SLP encouraged pt to clear throat and reswallow after every bite of solids). By session end pt was modified independent with precautions (handout from SLP  being used). SLP then educated pt about three swallow exercises: effortful swallow, Mendelsohn, and Super-supraglottic. Pt was mod A usually, faded to modified independent with all exercises. He was told he will need to do these until 10-05-22 and then another MBS will likely be recommended.   PATIENT EDUCATION: Education details: see "  today's treatment" Person educated: Patient Education method: Explanation, Demonstration, Verbal cues, and Handouts Education comprehension: verbalized understanding, returned demonstration, verbal cues required, and needs further education   ASSESSMENT:  CLINICAL IMPRESSION: Patient is a 66 y.o. male who was seen today for treatment of swallowing precautions and of HEP. Pt cont to progress with HEP and with precautions. SEE TX NOTE for more details. Pt reports his recent GI exam revealed he did NOT require esophageal dilitation/dilation. He will be able to reduce to once/week next session if progress is consistent.  OBJECTIVE IMPAIRMENTS: include dysphagia. These impairments are limiting patient from safety when swallowing. Factors affecting potential to achieve goals and functional outcome are previous level of function and severity of impairments. Patient will benefit from skilled SLP services to address above impairments and improve overall function.  REHAB POTENTIAL: Fair given above factors   GOALS: Goals reviewed with patient? Yes  SHORT TERM GOALS: Target date: 09-10-22  Pt will complete HEP with rare min A in two sessions Baseline: 08-11-22 Goal status: met  2.  Pt will follow aspiration precautions from [facility] 05-12-22 with rare min A in 2 sessions Baseline: 08-11-22 Goal status: met  3.  Pt will tell SLP 3 overt s/sx of aspiration PNA with modified independence Baseline:  Goal status: met  4.  Pt will complete PROM in first two therapy sessions Baseline:  Goal status: met    LONG TERM GOALS: Target date: 10-11-22  Pt will follow aspiration  precautions from [facility] 05-12-22 with modified independence in 2 sessions Baseline: 08-11-22 Goal status: met  2.  Pt will complete HEP with modified independence in three sessions Baseline: 08/21/22, 08/23/22 Goal status: ongoing  3.  Pt will achieve improvement in measurement of PROM in last 1-2 sessions Baseline:  Goal status: ongoing  4.  Pt will undergo follow up MBS to assess progress Baseline:  Goal status: ongoing   PLAN:  SLP FREQUENCY: 1x/week  SLP DURATION:  17 sessions until 10-11-21  PLANNED INTERVENTIONS: Aspiration precaution training, Pharyngeal strengthening exercises, Diet toleration management , Environmental controls, Trials of upgraded texture/liquids, Internal/external aids, SLP instruction and feedback, Compensatory strategies, and Patient/family education    [facility], [place] 08/23/2022, 8:52 AM

## 2022-08-30 ENCOUNTER — Encounter: Payer: 59 | Admitting: Occupational Therapy

## 2022-08-30 ENCOUNTER — Encounter: Payer: Self-pay | Admitting: Physical Therapy

## 2022-08-30 ENCOUNTER — Ambulatory Visit: Payer: 59

## 2022-08-30 ENCOUNTER — Ambulatory Visit: Payer: 59 | Admitting: Physical Therapy

## 2022-08-30 ENCOUNTER — Other Ambulatory Visit (HOSPITAL_COMMUNITY): Payer: Self-pay

## 2022-08-30 DIAGNOSIS — M6281 Muscle weakness (generalized): Secondary | ICD-10-CM | POA: Diagnosis not present

## 2022-08-30 DIAGNOSIS — R1313 Dysphagia, pharyngeal phase: Secondary | ICD-10-CM

## 2022-08-30 DIAGNOSIS — R2689 Other abnormalities of gait and mobility: Secondary | ICD-10-CM | POA: Diagnosis not present

## 2022-08-30 DIAGNOSIS — R262 Difficulty in walking, not elsewhere classified: Secondary | ICD-10-CM | POA: Diagnosis not present

## 2022-08-30 NOTE — Therapy (Signed)
OUTPATIENT PHYSICAL THERAPY NEURO TREATMENT   Patient Name: [name] MRN: [id number] DOB:05-31-1956, 66 y.o., male Today's Date: 08/30/2022   PCP: [name], MD  REFERRING PROVIDER: [name], MD  END OF SESSION:  PT End of Session - 08/30/22 1415     Visit Number 6    Number of Visits 13    Date for PT Re-Evaluation 09/13/22    Authorization Type [facility]    Authorization - Visit Number 6    Authorization - Number of Visits 25    PT Start Time 2876    PT Stop Time 1525    PT Time Calculation (min) 44 min    Equipment Utilized During Treatment Gait belt    Activity Tolerance Patient tolerated treatment well;Patient limited by fatigue    Behavior During Therapy WFL for tasks assessed/performed                Past Medical History:  Diagnosis Date   Alopecia    s/p chemotherapy   Arthritis    Bursitis    pt. 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,stg IV,HPV positive   Diabetes mellitus without complication ([place])    takes Amaryl,Januvia,and Metformin,daily   DVT, lower extremity ([place]) 2009   side effect from chemo and has blood clot left leg-wears compression stockings;was on Coumadin for 68months and has been off 5 yrs   History of chemotherapy    Cisplatin/Taxotere,5-FU   History of radiation therapy 01/28/08-03/11/08   left base tongue   History of shingles    Hyperlipidemia    takes Vytorin daily   Hypothyroidism    takes Synthroid daily   Joint pain    Joint swelling    Neuropathy    left foot   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 denies   Right kidney mass    Seborrheic keratosis    multiple on back   Past Surgical History:  Procedure Laterality Date   BIOPSY  07/07/2022   Procedure: BIOPSY;  Surgeon: Loney Laurence, DO;  Location: [facility];  Service: Gastroenterology;;   CARDIAC CATHETERIZATION     CHEST TUBE INSERTION   07/06/2022   Procedure: CHEST TUBE INSERTION;  Surgeon: Collene Gobble, MD;  Location: [facility];  Service: Cardiopulmonary;;   ESOPHAGOGASTRODUODENOSCOPY (EGD) WITH PROPOFOL N/A 07/07/2022   Procedure: ESOPHAGOGASTRODUODENOSCOPY (EGD) WITH PROPOFOL;  Surgeon: Loney Laurence, DO;  Location: [facility];  Service: Gastroenterology;  Laterality: N/A;   feeding tube placed  2009 and then removed   [place] Right 08/05/2020   Procedure: LYMPH NODE DISSECTION;  Surgeon: Raynelle Bring, MD;  Location: [facility];  Service: Urology;  Laterality: Right;   MOUTH SURGERY     PILONIDAL CYST / SINUS EXCISION  1974   port a cath placement     PORT-A-CATH REMOVAL  2010   right wrist fracture Right 2004   ROBOT ASSISTED LAPAROSCOPIC NEPHRECTOMY Right 08/05/2020   Procedure: XI ROBOTIC ASSISTED LAPAROSCOPIC RADICAL NEPHRECTOMY/PARTIAL ADRENALECTOMY;  Surgeon: Raynelle Bring, MD;  Location: [facility];  Service: Urology;  Laterality: Right;   SHOULDER ARTHROSCOPY WITH DISTAL CLAVICLE RESECTION Right 10/26/2016   Procedure: SHOULDER ARTHROSCOPY WITH DISTAL CLAVICLE RESECTION;  Surgeon: Justice Britain, MD;  Location: [facility];  Service: Orthopedics;  Laterality: Right;   SHOULDER ARTHROSCOPY WITH ROTATOR CUFF REPAIR AND SUBACROMIAL DECOMPRESSION Left  07/16/2014   Procedure: LEFT SHOULDER ARTHROSCOPY WITH ROTATOR CUFF REPAIR AND SUBACROMIAL DECOMPRESSION/DISTAL CLAVICLE RESECTION;  Surgeon: Marin Shutter, MD;  Location: [facility];  Service: Orthopedics;  Laterality: Left;   SHOULDER ARTHROSCOPY WITH ROTATOR CUFF REPAIR AND SUBACROMIAL DECOMPRESSION Right 10/26/2016   Procedure: RIGHT SHOULDER ARTHROSCOPY WITH ROTATOR CUFF REPAIR AND SUBACROMIAL DECOMPRESSION;  Surgeon: Justice Britain, MD;  Location: [facility];  Service: Orthopedics;  Laterality: Right;  requests 2hrs   teeth extraction #9     Patient Active Problem List   Diagnosis Date Noted   Acute respiratory failure with hypoxia ([place])  07/11/2022   Leukocytosis 07/11/2022   Dysphagia 07/11/2022   Chronic kidney disease (CKD), stage III (moderate) ([facility]) 07/11/2022   Anemia of chronic disease 07/11/2022   Thrombocytosis 07/11/2022   Pleural effusion 07/06/2022   Aspiration pneumonia ([place]) 07/05/2022   AKI (acute kidney injury) ([place]) 08/04/2021   History of DVT (deep vein thrombosis) 08/04/2021   History of pulmonary embolus (PE) 94/17/4081   Acute metabolic acidosis 44/81/8563   DKA (diabetic ketoacidosis) ([place]) 08/03/2021   Elevated serum creatinine 04/06/2021   Renal cell carcinoma, right ([place]) 08/05/2020   Thyroid activity decreased    Acute pulmonary embolism ([place]) 12/07/2014   Diabetes mellitus type 2 in nonobese ([place]) 12/07/2014   Cancer ([place])    History of angioplasty    Depression    DVT, lower extremity ([place])    Osteoradionecrosis of mandible    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/20/2011   Pulmonary embolism ([place]) 07/20/2011    ONSET DATE: 07/05/22  REFERRING DIAG: J18.9 (ICD-10-CM) - Community acquired pneumonia of left lung, unspecified part of lung  THERAPY DIAG:  Muscle weakness (generalized)  Other abnormalities of gait and mobility  Rationale for Evaluation and Treatment: Rehabilitation  SUBJECTIVE:                                                                                                                                                                                             SUBJECTIVE STATEMENT: No changes.  I'm started to get back to walking, 15-20 minutes.  "I know my limits".  Pt accompanied by: self  PERTINENT HISTORY: DM, HLD, neuropathy, R RTC repair, stage 4 tongue/squamous cell CA, R renal CA  PAIN:  Are you having pain? No pain today  PRECAUTIONS: Other: per patient, aspiration precautions- no bread, no crackers, thin liquids okay Per fax from Dr. Harrington Challenger 08/08/22 "pt tachy  all the time ok to treat if pulse >100."  WEIGHT BEARING RESTRICTIONS: No  FALLS: Has  patient fallen in last 6 months? 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  PLOF: Independent; retired; would like to return to hunting, fishing, woodworking   PATIENT GOALS: would like to return to hunting, fishing, woodworking   OBJECTIVE:       TODAY'S TREATMENT: 08/30/22 Activity Comments  Vitals  97% 93 bpm   Nustep L5 x 8 min BLEs for leg strengthening Cueing for pacing; 95% spO2, 105 bpm after warmup  STS sitting on airex + blue medball 2x10  21 inch >20 inch mat; cues to scoot forward and stand fully upright upon standing   Alt step taps 3 x 10, 2# weight  No UE support; vital:  94%, 122 >112 bpm     step up + opposite SKTC 2 x 10x each LE Intermittent UE support, mild instability with RLE SKTC  farmer's carry 10#; 2-3 min each hand Cueing to stand taller and correct rounded posture; 95% spO2, 121 BPM  Standing gastroc stretch at 4" step; stagger stance forward/back rock for dynamic gastroc stretch; seated ankle pumps 2 x 10 reps        HOME EXERCISE PROGRAM Last updated: 08/11/22 Access Code: [facility] URL: [email or web address] Date: 08/11/2022 Prepared by: [facility]  Exercises - Sit to Stand with Counter Support  - 1 x daily - 5 x weekly - 3 sets - 10 reps - Gastroc Stretch on Wall  - 1 x daily - 5 x weekly - 2 sets - 30 hold - Marching with Resistance  - 1 x daily - 5 x weekly - 2 sets - 10 reps - Standing Hip Abduction with Resistance at Ankles and Counter Support  - 1 x daily - 5 x weekly - 2 sets - 10 reps - Standing Hip Extension with Resistance at Ankles and Counter Support  - 1 x daily - 5 x weekly - 2 sets - 10 reps   Below measures were taken at time of  initial evaluation unless otherwise specified:   DIAGNOSTIC FINDINGS: 07/24/22 chest xray: Similar to slightly increased loculated left hydropneumothorax. Trace thickening of the right inferolateral pleura may represent trace pleural effusion. Similar asymmetrically low left lung volumes with left basilar patchy opacities, likely atelectasis.  COGNITION: Overall cognitive status: Within functional limits for tasks assessed   SENSATION: Reports a little N/T in B feet from neuropathy  COORDINATION: Alternating pronation/supination: WNL B Alternating toe tap: WNL B Finger to nose: WNL B    POSTURE: rounded shoulders, forward head, and increased thoracic kyphosis  LOWER EXTREMITY ROM:     Active  Right Eval Left Eval  Hip flexion    Hip extension    Hip abduction    Hip adduction    Hip internal rotation    Hip external rotation    Knee flexion    Knee extension    Ankle dorsiflexion 5 5  Ankle plantarflexion    Ankle inversion    Ankle eversion     (Blank rows = not tested)  LOWER EXTREMITY MMT:    MMT (in sitting) Right Eval Left Eval  Hip flexion 4 4-  Hip extension    Hip abduction 4+ 4+  Hip adduction 4- 4  Hip internal rotation    Hip external rotation    Knee flexion 5 4+  Knee  extension 5 5  Ankle dorsiflexion 4+ 4+  Ankle plantarflexion 4+ 4+  Ankle inversion    Ankle eversion    (Blank rows = not tested)   GAIT: Gait pattern: Good gait speed and step length but with L lateral trunk lean and thoracic kyphosis  Assistive device utilized: None Level of assistance: Complete Independence  FUNCTIONAL TESTS:        TODAY'S TREATMENT:                                                                                                                              DATE: 08/02/22    PATIENT EDUCATION: Education details: prognosis, POC, HEP, edu on exam findings  Person educated: Patient Education method: Explanation, Demonstration, Tactile cues,  Verbal cues, and Handouts Education comprehension: verbalized understanding  HOME EXERCISE PROGRAM: Access Code: [email or web address] URL: [email or web address] Date: 08/02/2022 Prepared by: [facility]  Exercises - Sit to Stand with Counter Support  - 1 x daily - 5 x weekly - 3 sets - 10 reps - Gastroc Stretch on Wall  - 1 x daily - 5 x weekly - 2 sets - 30 hold - Standing Hip Abduction with Counter Support  - 1 x daily - 5 x weekly - 2 sets - 10 reps - Standing Hip Extension with Counter Support  - 1 x daily - 5 x weekly - 2 sets - 10 reps   GOALS: Goals reviewed with patient? Yes  SHORT TERM GOALS: Target date: 08/23/2022  Patient to be independent with initial HEP. Baseline: HEP initiated Goal status: IN PROGRESS    LONG TERM GOALS: Target date: 09/13/2022  Patient to be independent with advanced HEP. Baseline: Not yet initiated  Goal status: IN PROGRESS  Patient to demonstrate B LE strength >/=4+/5.  Baseline: See above Goal status: IN PROGRESS  Patient to demonstrate 10 degrees B ankle dorsiflexion AROM WFL in order to improve efficiency with gait. Baseline: 5 deg B Goal status: IN PROGRESS  Patient to report tolerance for walking for 45 minutes without fatigue limiting.  Baseline: unable Goal status: IN PROGRESS  Patient to demonstrate 5xSTS test without UE support in <15 sec in order to decrease risk of falls.  Baseline: 13 sec with armrests  Goal status: IN PROGRESS  Patient to walk at least 1312 feet during 6 minute walk test in order to score closer to age-matched norms.  Baseline: 1164 ft 08/11/22 Goal status: IN PROGRESS 08/11/22   ASSESSMENT:  CLINICAL IMPRESSION: Today, pt does not report any pain in knees and overall tolerates exercise well.  He is able to increase time by 1 minute on NuStep to 8 minutes.  Utilized ankle weights with step taps and pt performs step ups with intermittent UE support.  He has  brief rest breaks between exercises and vitals largely remain WFL, with HR going above 120 bpm at times, but quickly goes back to 110-115 bpm  or below.  He is progressing towards LTGs and would benefit from skilled PT for improved overall functional mobility and strength.    OBJECTIVE IMPAIRMENTS: Abnormal gait, decreased activity tolerance, decreased endurance, decreased ROM, decreased strength, dizziness, impaired flexibility, improper body mechanics, and postural dysfunction.   ACTIVITY LIMITATIONS: carrying, lifting, bending, standing, squatting, stairs, bathing, and dressing  PARTICIPATION LIMITATIONS: meal prep, cleaning, laundry, shopping, community activity, yard work, and church  PERSONAL FACTORS: Age, Fitness, Past/current experiences, Time since onset of injury/illness/exacerbation, and 3+ comorbidities: DM, HLD, neuropathy, R RTC repair, stage 4 tongue/squamous cell CA, R renal CA  are also affecting patient's functional outcome.   REHAB POTENTIAL: Good  CLINICAL DECISION MAKING: Evolving/moderate complexity  EVALUATION COMPLEXITY: Moderate  PLAN:  PT FREQUENCY: 1-2x/week  PT DURATION: 6 weeks  PLANNED INTERVENTIONS: Therapeutic exercises, Therapeutic activity, Neuromuscular re-education, Balance training, Gait training, Patient/Family education, Self Care, Joint mobilization, Stair training, Vestibular training, Canalith repositioning, DME instructions, Aquatic Therapy, Dry Needling, Electrical stimulation, Cryotherapy, Moist heat, Taping, Manual therapy, and Re-evaluation  PLAN FOR NEXT SESSION: Continue towards LTGs.  Monitor spO2 during activity; progress hip strengthening and endurance    Mady Haagensen, PT 08/30/22 3:30 PM Phone: [phone] Fax: [phone]   [facility] at [facility] [address], [place], [place] [address] Phone # [phone] Fax # [phone]

## 2022-08-30 NOTE — Therapy (Signed)
OUTPATIENT SPEECH LANGUAGE PATHOLOGY TREAMENT   Patient Name: [name] MRN: [id number] DOB:[birth year], 66 y.o., male Today's Date: 08/30/2022  PCP: [name], MD REFERRING PROVIDER: [name], MD (referring),  [name], MD (documentation)  END OF SESSION:  End of Session - 08/30/22 1436     Visit Number 6    Number of Visits 17    Date for SLP Re-Evaluation 10/11/21    SLP Start Time 5701    SLP Stop Time  1437    SLP Time Calculation (min) 32 min    Activity Tolerance Patient tolerated treatment well                 Past Medical History:  Diagnosis Date   Alopecia    s/p chemotherapy   Arthritis    Bursitis    pt. denies   Cancer ([place]) 10/2007    tongue/squamous cell,stg IV,HPV positive   Diabetes mellitus without complication ([place])    takes Amaryl,Januvia,and Metformin,daily   DVT, lower extremity ([place]) 2009   side effect from chemo and has blood clot left leg-wears compression stockings;was on Coumadin for 35month and has been off 5 yrs   History of chemotherapy    Cisplatin/Taxotere,5-FU   History of radiation therapy 01/28/08-03/11/08   left base tongue   History of shingles    Hyperlipidemia    takes Vytorin daily   Hypothyroidism    takes Synthroid daily   Joint pain    Joint swelling    Neuropathy    left foot   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 denies   Right kidney mass    Seborrheic keratosis    multiple on back   Past Surgical History:  Procedure Laterality Date   BIOPSY  07/07/2022   Procedure: BIOPSY;  Surgeon: VLoney Laurence DO;  [facility]  Service: Gastroenterology;;   CARDIAC CATHETERIZATION     CHEST TUBE INSERTION  07/06/2022   Procedure: CHEST TUBE INSERTION;  Surgeon: BCollene Gobble MD;  [facility];  Service: Cardiopulmonary;;   ESOPHAGOGASTRODUODENOSCOPY (EGD) WITH PROPOFOL N/A  07/07/2022   Procedure: ESOPHAGOGASTRODUODENOSCOPY (EGD) WITH PROPOFOL;  Surgeon: VLoney Laurence DO;  [facility]  Service: Gastroenterology;  Laterality: N/A;   feeding tube placed  2009 and then removed   [place]   Procedure: LYMPH NODE DISSECTION;  Surgeon: BRaynelle Bring MD;  [facility];  Service: Urology;  Laterality: Right;   MOUTH SURGERY     PILONIDAL CYST / SINUS EXCISION  1974   port a cath placement     PORT-A-CATH REMOVAL  2010   right wrist fracture Right 2004   ROBOT ASSISTED LAPAROSCOPIC NEPHRECTOMY Right 08/05/2020   Procedure: XI ROBOTIC ASSISTED LAPAROSCOPIC RADICAL NEPHRECTOMY/PARTIAL ADRENALECTOMY;  Surgeon: BRaynelle Bring MD;  [facility];  Service: Urology;  Laterality: Right;   SHOULDER ARTHROSCOPY WITH DISTAL CLAVICLE RESECTION Right 10/26/2016   Procedure: SHOULDER ARTHROSCOPY WITH DISTAL CLAVICLE RESECTION;  Surgeon: KJustice Britain MD;  [facility]  Service: Orthopedics;  Laterality: Right;   SHOULDER ARTHROSCOPY WITH ROTATOR CUFF REPAIR AND SUBACROMIAL DECOMPRESSION Left 07/16/2014   Procedure: LEFT SHOULDER ARTHROSCOPY WITH ROTATOR CUFF REPAIR AND SUBACROMIAL DECOMPRESSION/DISTAL CLAVICLE RESECTION;  Surgeon: KMarin Shutter MD;  [facility]  Service: Orthopedics;  Laterality: Left;   SHOULDER ARTHROSCOPY WITH ROTATOR CUFF  REPAIR AND SUBACROMIAL DECOMPRESSION Right 10/26/2016   Procedure: RIGHT SHOULDER ARTHROSCOPY WITH ROTATOR CUFF REPAIR AND SUBACROMIAL DECOMPRESSION;  Surgeon: Justice Britain, MD;  Location: [facility];  Service: Orthopedics;  Laterality: Right;  requests 2hrs   teeth extraction #9     Patient Active Problem List   Diagnosis Date Noted   Acute respiratory failure with hypoxia ([place]) 07/11/2022   Leukocytosis 07/11/2022   Dysphagia 07/11/2022   Chronic kidney disease (CKD), stage III (moderate) ([facility]) 07/11/2022   Anemia of chronic disease 07/11/2022    Thrombocytosis 07/11/2022   Pleural effusion 07/06/2022   Aspiration pneumonia ([place]) 07/05/2022   AKI (acute kidney injury) ([place]) 08/04/2021   History of DVT (deep vein thrombosis) 08/04/2021   History of pulmonary embolus (PE) 81/85/6314   Acute metabolic acidosis 97/10/6376   DKA (diabetic ketoacidosis) ([place]) 08/03/2021   Elevated serum creatinine 04/06/2021   Renal cell carcinoma, right ([place]) 08/05/2020   Thyroid activity decreased    Acute pulmonary embolism ([place]) 12/07/2014   Diabetes mellitus type 2 in nonobese ([place]) 12/07/2014   Cancer ([place])    History of angioplasty    Depression    DVT, lower extremity ([place])    Osteoradionecrosis of mandible    Malignant neoplasm of base of tongue ([place]) 07/20/2011   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/20/2011   Pulmonary embolism ([place]) 07/20/2011    ONSET DATE: 2009 after head/neck radiation, script dated 07-14-22   REFERRING DIAG: J18.9 (ICD-10-CM) - Community acquired pneumonia of left lung, unspecified part of lung   THERAPY DIAG:  Dysphagia, pharyngeal phase  Rationale for Evaluation and Treatment: Rehabilitation  SUBJECTIVE:   SUBJECTIVE STATEMENT: Talbert had crackers since last session with water (to soften cracker) without overt s/sx aspiration.  Pt accompanied by: self  PERTINENT HISTORY: 66 y.o. male presented with worsening shortness of breath and cough. Chest CT 11/1: "Interval worsening of diffuse bronchial wall thickening with  right lower lobe and right middle lobe development of atypical  infection." OP MBS 05/12/2022 with silent aspiration of all consistencies of liquid.  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  Visited ENT on 03/08/2022 with history as follows: Pt "had base of tongue cancer in 2009 treated with radiation and chemotherapy with cure. He had kidney cancer in 2021. He has noticed more hoarseness with more talking over the past six months. Bulkier foods do not pass very well with swallowing. He coughs a lot when eating. He is able to swallow liquids fine." Pt has a history of left vocal cord paralysis (decreased movmenet of left VF compared to right, good glottic closure, no muscle tension patterns, laryngeal edema is minimal on fiberoptic exam) and tongue cancer. CT on 03/23/22 shows no mass, asymmetric glottic consistent with history of paralysis. No mass on reccurent laryngeal nerve. Pt underwent esophagram on 03/15/22, ordered by Dr Redmond Baseman due to dysphagia. Pt aspirated thick barium and study was terminated. MBS recommended by radiologist.  PAIN:  Are you having pain? Yes: NPRS scale: 4/10 Pain location: bil knees Pain description: sore Aggravating factors: kneeling Relieving factors: walking/stretching   PLOF:  Level of assistance: Independent with ADLs Employment: Retired  PATIENT GOALS: Improve swallowing  OBJECTIVE:   DIAGNOSTIC FINDINGS:  DG CHEST 07/05/22  IMPRESSION: There is interval appearance of large area of opacification in the left mid and left lower lung fields. Findings suggest pneumonia and large effusion. Part of the effusion appears to be loculated along the lateral chest wall. Possibility of empyema is not excluded. Follow-up CT may be considered.     RECOMMENDATIONS FROM OBJECTIVE SWALLOW STUDY (MBSS/FEES):   Objective swallow impairments: Pt demonstrates an oropharyngeal dysphagia characterized by decreased mobility across pharyngeal structures that leads to mild silent aspiration of liquids, regardless of texture. Pt can achieve adequate glottic closure at the height of the swallow, but epiglottic deflection, reduced laryngeal  elevation and reduced UES opening lead to penetration and residue that falls into the airway just as glottis opens. SLP attempted a variety of compenstory strategies including head turn left, head tilt right, chin tuck, supraglottic swallow. None prevented aspiration. A super-superglotttic swallow may be beneficial, mostly because pt can eject most aspirate with a cued cough and throat clear. With solids there is mild to moderate residue in pyriforms and valleculae due to the the same weakness. Pt usually uses a liquid wash to clear. This is likely the cause of pts globus sensation. A chin tuck with solids did clear a bit more of the stasis than an upright swallow. Effortful swallow not effective. Provided visual feedback to pt and provided extensive education and recommendations post session. Would not recommend any texture modification as there is no beenfit to thickened liquids for this pt. Futhermore, pt has not had any recent repiratory infection that would warrant excessive precaution. However, recommended pt: 1. trial a chin tuck with solids, 2. cough and clear throat frequently with intake even if he doesnt feel the need to do so, 3. complete thorough oral care to reduce bacterial load with aspiration, 4. inform providers of dysphagia and 5. consider f/u with SLP for home exercise program. 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 Exercise and tx targeting pharyngeal musculature could be helpful. Objective recommended compensations:  PRECAUTIONS: Slow rate;Small sips/bites;Follow solids with liquid;Clear throat intermittently;Clear throat after each swallow;Chin tuck BEDSIDE SWALLOW EVAL RESULTS NOVEMBER 2023: Clinical Impression  Pt presents with clinical indicators of pharyngeal dysphagia with known hx of chronic, silent aspiration s/p rad tx for oropharyngeal CA 2009.  Pt completed OP MBSS on 05/12/2022 revealing  "oropharyngeal dysphagia characterized by decreased mobility across pharyngeal structures that leads to mild silent aspiration of liquids, regardless of texture." Recommendations at that time to continue regular diet with thin liquids as modified consistencies were of no benefit.  Aspiration precaution recommendations from MBS as follows: " 1. trial a chin tuck with solids, 2. cough and clear throat frequently with intake even if he doesnt feel the need to do so, 3. complete thorough oral care to reduce bacterial load with aspiration, 4. inform providers of dysphagia and 5. consider f/u with SLP for home exercise program."  These were reviewed with pt.  Pt reports that he did not have any speech therapy follow up after this assessment, but he is agreeable to trial of swallow exercise program in house.  Pt will likely require 6-8 weeks of intensive swallow exercise program to effect change in swallow function and should follow with OP SLP for continued dysphagia management.  Pt with hx VF paralysis.  This was never directly addressed.  Pt's vocal quality is strong and clear in conversation. Today pt exhibited intermittent throat clear with thin liquid and regular solids.  On palpation, laryngeal elevation was reduced.  Pt achieved greater hyolaryngeal excursion with a sip of water than with voluntary swallow without bolus trial (dry swallow).  Pt notes that soft foods (e.g. applesauce) are easier to eat for him.  There were no clinical s/s of puree.  Even in the absence of clinical s/s of aspiration, there is still a risk for prandial aspiration given hx of silent aspiration of liquid.  Pt would like to resume regular texture diet with thin liquid. He does still c/o of feeling of stasis with solid textures regularly and may benefit from further assessment and possible treatment for esophageal narrowing.  He states he would like to address the feelings of stasis with PO intake.  Consider GI consult if pt is a  candidate for espohageal dilation.  Pt likely does not need a repeat instrumental swallow evaluation at this time.  It is unlikely that swallow function has changed since September, and pt would like to continue oral diet at this time.   Diet Recommendation Regular;Thin liquid  Liquid Administration via: Cup;Straw Medication Administration:  (As tolerated) Supervision: Patient able to self feed  Compensations:  Slow rate; Small sips/bites; Chin tuck with solids; Follow solids with liquid; Clear throat regularly   PATIENT REPORTED OUTCOME MEASURES (PROM): EAT-10: administered today and pt scored 23/30 (lower scores indicate greater QOL from swallowing). "4" was answered for "I cough when I eat", and "swallowing is stressful".   TODAY'S TREATMENT:                                                                                                                                         DATE:  08/30/22: Pt ate dys III and drank H2O via cup today and followed precautions with independence; pt with one coughing episode which he needed to cough it out - "This is the first this has happened in 3 weeks.".  Pt was independent with HEP. He will reduce to once every other week.  08/23/22: Pt ate dys III and drank H2O via cup today and followed precautions with independence.Pt was independent with HEP. He will reduce to once/week beginning next week.   08/21/22: "I tucked my chin and my daughter in law asked me, 'Are you ok?' I told her yeah, I'm just eating." Pt ate dys III and drank H2O via cup today and followed precautions with independence. HEP was performed with independence today. If pt's performance remains the same after next session he can decr frequency to once/week.   12:14/23: "The last few days I haven't been spitting like I used to." Pt also reports wife notes he doesn't cough as much during meals. Pt req'd initial mod A for super-supraglottic faded to modified independent. Pt was reminded  to get at least 7 reps of each exercise and his target is 30/daily.  08/11/22: Pt without hydrophonic voice today upon entry to [facility]. Pt req'd  cues for super-supraglottic, but was indpendent with other 2 exercises. SLP provided pt with overt s/sx aspiration PNA - pt told SLP 3 overt s/sx. With POs, pt followed precautions 100%.   08/02/22: SLP reviewed MBS with pt from September and precautions with POs. Pt req'd cues for alternating bite/sip and clear throat intermittently (SLP encouraged pt to clear throat and reswallow after every bite of solids). By session end pt was modified independent with precautions (handout from SLP being used). SLP then educated pt about three swallow exercises: effortful swallow, Mendelsohn, and Super-supraglottic. Pt was mod A usually, faded to modified independent with all exercises. He was told he will need to do these until 10-05-22 and then another MBS will likely be recommended.   PATIENT EDUCATION: Education details: see "today's treatment" Person educated: Patient Education method: Explanation, Demonstration, Verbal cues, and Handouts Education comprehension: verbalized understanding, returned demonstration, verbal cues required, and needs further education   ASSESSMENT:  CLINICAL IMPRESSION: Patient is a 66 y.o. male who was seen today for treatment of swallowing precautions and of HEP. Pt cont to progress with HEP and with precautions. SEE TX NOTE for more details. Pt agreed he can reduce to once every other week. If he remains successful with HEP and is safe with POs, a follow up MBS will be requested 09/26/22.   OBJECTIVE IMPAIRMENTS: include dysphagia. These impairments are limiting patient from safety when swallowing. Factors affecting potential to achieve goals and functional outcome are previous level of function and severity of impairments. Patient will benefit from skilled SLP services to address above impairments and improve overall function.  REHAB  POTENTIAL: Fair given above factors   GOALS: Goals reviewed with patient? Yes  SHORT TERM GOALS: Target date: 09-10-22  Pt will complete HEP with rare min A in two sessions Baseline: 08-11-22 Goal status: met  2.  Pt will follow aspiration precautions from [facility] 05-12-22 with rare min A in 2 sessions Baseline: 08-11-22 Goal status: met  3.  Pt will tell SLP 3 overt s/sx of aspiration PNA with modified independence Baseline:  Goal status: met  4.  Pt will complete PROM in first two therapy sessions Baseline:  Goal status: met    LONG TERM GOALS: Target date: 10-11-22  Pt will follow aspiration precautions from [facility] 05-12-22 with modified independence in 2 sessions Baseline: 08-11-22 Goal status: met  2.  Pt will complete HEP with modified independence in three sessions Baseline: 08/21/22, 08/23/22 Goal status: met  3.  Pt will achieve improvement in measurement of PROM in last 1-2 sessions Baseline:  Goal status: ongoing  4.  Pt will undergo follow up MBS to assess progress Baseline:  Goal status: ongoing   PLAN:  SLP FREQUENCY: 1x/week  SLP DURATION:  17 sessions until 10-11-21  PLANNED INTERVENTIONS: Aspiration precaution training, Pharyngeal strengthening exercises, Diet toleration management , Environmental controls, Trials of upgraded texture/liquids, Internal/external aids, SLP instruction and feedback, Compensatory strategies, and Patient/family education    [facility], [place] 08/30/2022, 2:37 PM

## 2022-08-31 ENCOUNTER — Other Ambulatory Visit (HOSPITAL_COMMUNITY): Payer: Self-pay

## 2022-08-31 NOTE — Therapy (Signed)
OUTPATIENT PHYSICAL THERAPY NEURO TREATMENT   Patient Name: [name] MRN: [id number] DOB:February 08, 1956, 66 y.o., male Today's Date: 09/01/2022   PCP: [name], MD  REFERRING PROVIDER: [name], MD  END OF SESSION:  PT End of Session - 09/01/22 0926     Visit Number 7    Number of Visits 13    Date for PT Re-Evaluation 09/13/22    Authorization Type [facility]    Authorization - Visit Number 7    Authorization - Number of Visits 25    PT Start Time 8588    PT Stop Time 0926    PT Time Calculation (min) 40 min    Equipment Utilized During Treatment Gait belt    Activity Tolerance Patient tolerated treatment well    Behavior During Therapy WFL for tasks assessed/performed                 Past Medical History:  Diagnosis Date   Alopecia    s/p chemotherapy   Arthritis    Bursitis    pt. 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 denies   Right kidney mass    Seborrheic keratosis    multiple on back   Past Surgical History:  Procedure Laterality Date   BIOPSY  07/07/2022   Procedure: BIOPSY;  Surgeon: Loney Laurence, DO;  Location: [facility];  Service: Gastroenterology;;   CARDIAC CATHETERIZATION     CHEST TUBE INSERTION  07/06/2022   Procedure:  CHEST TUBE INSERTION;  Surgeon: Collene Gobble, MD;  Location: [facility];  Service: Cardiopulmonary;;   ESOPHAGOGASTRODUODENOSCOPY (EGD) WITH PROPOFOL N/A 07/07/2022   Procedure: ESOPHAGOGASTRODUODENOSCOPY (EGD) WITH PROPOFOL;  Surgeon: Loney Laurence, DO;  Location: [facility];  Service: Gastroenterology;  Laterality: N/A;   feeding tube placed  2009 and then removed   [place] Right 08/05/2020   Procedure: LYMPH NODE DISSECTION;  Surgeon: Raynelle Bring, MD;  Location: [facility];  Service: Urology;  Laterality: Right;   MOUTH SURGERY     PILONIDAL CYST / SINUS EXCISION  1974   port a cath placement     PORT-A-CATH REMOVAL  2010   right wrist fracture Right 2004   ROBOT ASSISTED LAPAROSCOPIC NEPHRECTOMY Right 08/05/2020   Procedure: XI ROBOTIC ASSISTED LAPAROSCOPIC RADICAL NEPHRECTOMY/PARTIAL ADRENALECTOMY;  Surgeon: Raynelle Bring, MD;  Location: [facility];  Service: Urology;  Laterality: Right;   SHOULDER ARTHROSCOPY WITH DISTAL CLAVICLE RESECTION Right 10/26/2016   Procedure: SHOULDER ARTHROSCOPY WITH DISTAL CLAVICLE RESECTION;  Surgeon: Justice Britain, MD;  Location: [facility];  Service: Orthopedics;  Laterality: Right;   SHOULDER ARTHROSCOPY WITH ROTATOR CUFF REPAIR AND SUBACROMIAL DECOMPRESSION Left 07/16/2014  Procedure: LEFT SHOULDER ARTHROSCOPY WITH ROTATOR CUFF REPAIR AND SUBACROMIAL DECOMPRESSION/DISTAL CLAVICLE RESECTION;  Surgeon: Marin Shutter, MD;  Location: [place];  Service: Orthopedics;  Laterality: Left;   SHOULDER ARTHROSCOPY WITH ROTATOR CUFF REPAIR AND SUBACROMIAL DECOMPRESSION Right 10/26/2016   Procedure: RIGHT SHOULDER ARTHROSCOPY WITH ROTATOR CUFF REPAIR AND SUBACROMIAL DECOMPRESSION;  Surgeon: Justice Britain, MD;  Location: [facility];  Service: Orthopedics;  Laterality: Right;  requests 2hrs   teeth extraction #9     Patient Active Problem List   Diagnosis Date Noted   Acute respiratory failure with hypoxia ([place]) 07/11/2022    Leukocytosis 07/11/2022   Dysphagia 07/11/2022   Chronic kidney disease (CKD), stage III (moderate) (HCC) 07/11/2022   Anemia of chronic disease 07/11/2022   Thrombocytosis 07/11/2022   Pleural effusion 07/06/2022   Aspiration pneumonia ([place]) 07/05/2022   AKI (acute kidney injury) ([place]) 08/04/2021   History of DVT (deep vein thrombosis) 08/04/2021   History of pulmonary embolus (PE) 44/31/5400   Acute metabolic acidosis 86/76/1950   DKA (diabetic ketoacidosis) ([place]) 08/03/2021   Elevated serum creatinine 04/06/2021   Renal cell carcinoma, right ([place]) 08/05/2020   Thyroid activity decreased    Acute pulmonary embolism ([place]) 12/07/2014   Diabetes mellitus type 2 in nonobese ([place]) 12/07/2014   Cancer ([place])    History of angioplasty    Depression    DVT, lower extremity ([place])    Osteoradionecrosis of mandible    Malignant neoplasm of base of tongue ([place]) 07/20/2011   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/20/2011   Pulmonary embolism ([place]) 07/20/2011    ONSET DATE: 07/05/22  REFERRING DIAG: J18.9 (ICD-10-CM) - Community acquired pneumonia of left lung, unspecified part of lung  THERAPY DIAG:  Muscle weakness (generalized)  Other abnormalities of gait and mobility  Rationale for Evaluation and Treatment: Rehabilitation  SUBJECTIVE:                                                                                                                                                                                             SUBJECTIVE STATEMENT: I feel pretty good. Didn't sleep well- had 2 cups of coffee in the evening. Reports that he is getting his energy levels back and plans to join silver sneakers in January.   Pt accompanied by: self  PERTINENT HISTORY: DM, HLD, neuropathy, R RTC repair, stage 4 tongue/squamous cell CA, R renal CA  PAIN:  Are you having pain? No   PRECAUTIONS: Other: per patient, aspiration precautions- no bread, no  crackers, thin liquids okay Per fax from Dr. Harrington Challenger 08/08/22 "pt tachy all the time ok to  treat if pulse >100."  WEIGHT BEARING RESTRICTIONS: No  FALLS: Has patient fallen in last 6 months? 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  PLOF: Independent; retired; would like to return to hunting, fishing, woodworking   PATIENT GOALS: would like to return to hunting, fishing, woodworking   OBJECTIVE:      TODAY'S TREATMENT: 09/01/22 Activity Comments  Scifit Les only L2.0 x 6 min Able to maintain conversation throughout  wall squats with pball 2x10 Good form and amplitude; 94% spO2, 112 bpm after completion  monster walk yellow TB fwd/back 4x43ft Cueing to reduce speed backwards; improve posture  tandem walk fwd/back 6x along II bars In II bars; only occasional UE support required; 96% spO2, 101 bpm after completion  resisted walking fwd/back with blue TB 5x In II bars; some hesitation/mild imbalance backwards  sitting: HS stretch 30" each KTOS 30" each fig 4 30" each gastroc stretch with strap 30" each Cueing for proper hand placement and alignment for max stretch; quite tight in B hips            PATIENT EDUCATION: Education details: discussion on PT progress and remainder of POC Person educated: Patient Education method: Explanation Education comprehension: verbalized understanding and returned demonstration    HOME EXERCISE PROGRAM Last updated: 08/11/22 Access Code: [email or web address] URL: [email or web address] Date: 08/11/2022 Prepared by: [facility]  Exercises - Sit to Stand with Counter Support  - 1 x daily - 5 x weekly - 3 sets - 10 reps - Gastroc Stretch on Wall  - 1 x daily - 5 x weekly - 2 sets - 30 hold - Marching with Resistance  - 1 x daily - 5 x weekly - 2  sets - 10 reps - Standing Hip Abduction with Resistance at Ankles and Counter Support  - 1 x daily - 5 x weekly - 2 sets - 10 reps - Standing Hip Extension with Resistance at Ankles and Counter Support  - 1 x daily - 5 x weekly - 2 sets - 10 reps   Below measures were taken at time of initial evaluation unless otherwise specified:   DIAGNOSTIC FINDINGS: 07/24/22 chest xray: Similar to slightly increased loculated left hydropneumothorax. Trace thickening of the right inferolateral pleura may represent trace pleural effusion. Similar asymmetrically low left lung volumes with left basilar patchy opacities, likely atelectasis.  COGNITION: Overall cognitive status: Within functional limits for tasks assessed   SENSATION: Reports a little N/T in B feet from neuropathy  COORDINATION: Alternating pronation/supination: WNL B Alternating toe tap: WNL B Finger to nose: WNL B    POSTURE: rounded shoulders, forward head, and increased thoracic kyphosis  LOWER EXTREMITY ROM:     Active  Right Eval Left Eval  Hip flexion    Hip extension    Hip abduction    Hip adduction    Hip internal rotation    Hip external rotation    Knee flexion    Knee extension    Ankle dorsiflexion 5 5  Ankle plantarflexion    Ankle inversion    Ankle eversion     (Blank rows = not tested)  LOWER EXTREMITY MMT:    MMT (in sitting) Right Eval Left Eval  Hip flexion 4 4-  Hip extension    Hip abduction 4+ 4+  Hip  adduction 4- 4  Hip internal rotation    Hip external rotation    Knee flexion 5 4+  Knee extension 5 5  Ankle dorsiflexion 4+ 4+  Ankle plantarflexion 4+ 4+  Ankle inversion    Ankle eversion    (Blank rows = not tested)   GAIT: Gait pattern: Good gait speed and step length but with L lateral trunk lean and thoracic kyphosis  Assistive device utilized: None Level of assistance: Complete Independence  FUNCTIONAL TESTS:        TODAY'S TREATMENT:                                                                                                                               DATE: 08/02/22    PATIENT EDUCATION: Education details: prognosis, POC, HEP, edu on exam findings  Person educated: Patient Education method: Explanation, Demonstration, Tactile cues, Verbal cues, and Handouts Education comprehension: verbalized understanding  HOME EXERCISE PROGRAM: Access Code: [email or web address] URL: [email or web address] Date: 08/02/2022 Prepared by: [facility]  Exercises - Sit to Stand with Counter Support  - 1 x daily - 5 x weekly - 3 sets - 10 reps - Gastroc Stretch on Wall  - 1 x daily - 5 x weekly - 2 sets - 30 hold - Standing Hip Abduction with Counter Support  - 1 x daily - 5 x weekly - 2 sets - 10 reps - Standing Hip Extension with Counter Support  - 1 x daily - 5 x weekly - 2 sets - 10 reps   GOALS: Goals reviewed with patient? Yes  SHORT TERM GOALS: Target date: 08/23/2022  Patient to be independent with initial HEP. Baseline: HEP initiated Goal status: IN PROGRESS    LONG TERM GOALS: Target date: 09/13/2022  Patient to be independent with advanced HEP. Baseline: Not yet initiated  Goal status: IN PROGRESS  Patient to demonstrate B LE strength >/=4+/5.  Baseline: See above Goal status: IN PROGRESS  Patient to demonstrate 10 degrees B ankle dorsiflexion AROM WFL in order to improve efficiency with gait. Baseline: 5 deg B Goal status: IN PROGRESS  Patient to report tolerance for walking for 45 minutes without fatigue limiting.  Baseline: unable Goal status: IN PROGRESS  Patient to demonstrate 5xSTS test without UE support in <15 sec in order to decrease risk of falls.  Baseline: 13 sec with armrests  Goal status: IN PROGRESS  Patient to walk at least 1312 feet during 6 minute walk test in order to score closer to age-matched norms.  Baseline: 1164 ft 08/11/22 Goal status: IN PROGRESS  08/11/22   ASSESSMENT:  CLINICAL IMPRESSION: Patient arrived to session with report that he is getting his energy levels back and plans to join silver sneakers in January. Worked on LE strengthening with good amplitude and form with movements. Does require occasional cueing for proper posture d/t excessive thoracic kyphosis and rounded posture. Resisted walking for strengthening  and balance revealed some hesitation and imbalance in backwards direction. Patient demonstrates improved endurance and tolerance for activities without SOB and with less elevation in HR/drop in O2. Patient is progressing well towards goals.   OBJECTIVE IMPAIRMENTS: Abnormal gait, decreased activity tolerance, decreased endurance, decreased ROM, decreased strength, dizziness, impaired flexibility, improper body mechanics, and postural dysfunction.   ACTIVITY LIMITATIONS: carrying, lifting, bending, standing, squatting, stairs, bathing, and dressing  PARTICIPATION LIMITATIONS: meal prep, cleaning, laundry, shopping, community activity, yard work, and church  PERSONAL FACTORS: Age, Fitness, Past/current experiences, Time since onset of injury/illness/exacerbation, and 3+ comorbidities: DM, HLD, neuropathy, R RTC repair, stage 4 tongue/squamous cell CA, R renal CA  are also affecting patient's functional outcome.   REHAB POTENTIAL: Good  CLINICAL DECISION MAKING: Evolving/moderate complexity  EVALUATION COMPLEXITY: Moderate  PLAN:  PT FREQUENCY: 1-2x/week  PT DURATION: 6 weeks  PLANNED INTERVENTIONS: Therapeutic exercises, Therapeutic activity, Neuromuscular re-education, Balance training, Gait training, Patient/Family education, Self Care, Joint mobilization, Stair training, Vestibular training, Canalith repositioning, DME instructions, Aquatic Therapy, Dry Needling, Electrical stimulation, Cryotherapy, Moist heat, Taping, Manual therapy, and Re-evaluation  PLAN FOR NEXT SESSION: Continue towards LTGs.  Monitor  spO2 during activity; progress hip strengthening and endurance    Janene Harvey, PT, DPT 09/01/22 9:28 AM  [facility] [address] [place], [place], [place] [address] Phone # [phone] Fax # [phone]

## 2022-09-01 ENCOUNTER — Encounter: Payer: Self-pay | Admitting: Physical Therapy

## 2022-09-01 ENCOUNTER — Ambulatory Visit: Payer: 59 | Admitting: Physical Therapy

## 2022-09-01 DIAGNOSIS — R262 Difficulty in walking, not elsewhere classified: Secondary | ICD-10-CM | POA: Diagnosis not present

## 2022-09-01 DIAGNOSIS — M6281 Muscle weakness (generalized): Secondary | ICD-10-CM

## 2022-09-01 DIAGNOSIS — R2689 Other abnormalities of gait and mobility: Secondary | ICD-10-CM | POA: Diagnosis not present

## 2022-09-01 DIAGNOSIS — R1313 Dysphagia, pharyngeal phase: Secondary | ICD-10-CM | POA: Diagnosis not present

## 2022-09-02 ENCOUNTER — Other Ambulatory Visit (HOSPITAL_COMMUNITY): Payer: Self-pay

## 2022-09-05 ENCOUNTER — Encounter: Payer: Self-pay | Admitting: Physical Therapy

## 2022-09-05 ENCOUNTER — Other Ambulatory Visit (HOSPITAL_COMMUNITY): Payer: Self-pay

## 2022-09-05 ENCOUNTER — Encounter: Payer: Self-pay | Admitting: Occupational Therapy

## 2022-09-05 ENCOUNTER — Ambulatory Visit: Payer: Medicare HMO | Attending: Family Medicine | Admitting: Physical Therapy

## 2022-09-05 DIAGNOSIS — R2689 Other abnormalities of gait and mobility: Secondary | ICD-10-CM | POA: Insufficient documentation

## 2022-09-05 DIAGNOSIS — M6281 Muscle weakness (generalized): Secondary | ICD-10-CM | POA: Insufficient documentation

## 2022-09-05 DIAGNOSIS — R1313 Dysphagia, pharyngeal phase: Secondary | ICD-10-CM | POA: Diagnosis present

## 2022-09-05 MED ORDER — PIP PEN NEEDLES 31G X 5MM 31G X 5 MM MISC
3 refills | Status: AC
  Filled 2022-09-05 (×2): qty 100, 90d supply, fill #0

## 2022-09-05 NOTE — Therapy (Signed)
OUTPATIENT PHYSICAL THERAPY NEURO TREATMENT   Patient Name: [name] MRN: [id number] DOB:Nov 02, 1955, 67 y.o., male Today's Date: 09/05/2022   PCP: [name], MD  REFERRING PROVIDER: [name], MD  END OF SESSION:  PT End of Session - 09/05/22 1402     Visit Number 8    Number of Visits 13    Date for PT Re-Evaluation 09/13/22    Authorization Type [facility]-verifying for 2024    Authorization - Number of Visits 25    PT Start Time 1403    PT Stop Time 1441    PT Time Calculation (min) 38 min    Equipment Utilized During Treatment Gait belt    Activity Tolerance Patient tolerated treatment well    Behavior During Therapy WFL for tasks assessed/performed                 Past Medical History:  Diagnosis Date   Alopecia    s/p chemotherapy   Arthritis    Bursitis    pt. 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,stg IV,HPV positive   Diabetes mellitus without complication ([place])    takes Amaryl,Januvia,and Metformin,daily   DVT, lower extremity ([place]) 2009   side effect from chemo and has blood clot left leg-wears compression stockings;was on Coumadin for 44months and has been off 5 yrs   History of chemotherapy    Cisplatin/Taxotere,5-FU   History of radiation therapy 01/28/08-03/11/08   left base tongue   History of shingles    Hyperlipidemia    takes Vytorin daily   Hypothyroidism    takes Synthroid daily   Joint pain    Joint swelling    Neuropathy    left foot   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 denies   Right kidney mass    Seborrheic keratosis    multiple on back   Past Surgical History:  Procedure Laterality Date   BIOPSY  07/07/2022   Procedure: BIOPSY;  Surgeon: Loney Laurence, DO;  Location: [facility];  Service: Gastroenterology;;   CARDIAC CATHETERIZATION     CHEST TUBE INSERTION  07/06/2022   Procedure: CHEST TUBE INSERTION;   Surgeon: Collene Gobble, MD;  Location: [facility];  Service: Cardiopulmonary;;   ESOPHAGOGASTRODUODENOSCOPY (EGD) WITH PROPOFOL N/A 07/07/2022   Procedure: ESOPHAGOGASTRODUODENOSCOPY (EGD) WITH PROPOFOL;  Surgeon: Loney Laurence, DO;  Location: [facility];  Service: Gastroenterology;  Laterality: N/A;   feeding tube placed  2009 and then removed   [place] Right 08/05/2020   Procedure: LYMPH NODE DISSECTION;  Surgeon: Raynelle Bring, MD;  Location: [facility];  Service: Urology;  Laterality: Right;   MOUTH SURGERY     PILONIDAL CYST / SINUS EXCISION  1974   port a cath placement     PORT-A-CATH REMOVAL  2010   right wrist fracture Right 2004   ROBOT ASSISTED LAPAROSCOPIC NEPHRECTOMY Right 08/05/2020   Procedure: XI ROBOTIC ASSISTED LAPAROSCOPIC RADICAL NEPHRECTOMY/PARTIAL ADRENALECTOMY;  Surgeon: Raynelle Bring, MD;  Location: [facility];  Service: Urology;  Laterality: Right;   SHOULDER ARTHROSCOPY WITH DISTAL CLAVICLE RESECTION Right 10/26/2016   Procedure: SHOULDER ARTHROSCOPY WITH DISTAL CLAVICLE RESECTION;  Surgeon: Justice Britain, MD;  Location: [facility];  Service: Orthopedics;  Laterality: Right;   SHOULDER ARTHROSCOPY WITH ROTATOR CUFF REPAIR AND SUBACROMIAL DECOMPRESSION Left 07/16/2014   Procedure: LEFT SHOULDER ARTHROSCOPY WITH  ROTATOR CUFF REPAIR AND SUBACROMIAL DECOMPRESSION/DISTAL CLAVICLE RESECTION;  Surgeon: Marin Shutter, MD;  Location: [facility];  Service: Orthopedics;  Laterality: Left;   SHOULDER ARTHROSCOPY WITH ROTATOR CUFF REPAIR AND SUBACROMIAL DECOMPRESSION Right 10/26/2016   Procedure: RIGHT SHOULDER ARTHROSCOPY WITH ROTATOR CUFF REPAIR AND SUBACROMIAL DECOMPRESSION;  Surgeon: Justice Britain, MD;  Location: [facility];  Service: Orthopedics;  Laterality: Right;  requests 2hrs   teeth extraction #9     Patient Active Problem List   Diagnosis Date Noted   Acute respiratory failure with hypoxia ([place]) 07/11/2022   Leukocytosis 07/11/2022    Dysphagia 07/11/2022   Chronic kidney disease (CKD), stage III (moderate) (HCC) 07/11/2022   Anemia of chronic disease 07/11/2022   Thrombocytosis 07/11/2022   Pleural effusion 07/06/2022   Aspiration pneumonia ([place]) 07/05/2022   AKI (acute kidney injury) ([place]) 08/04/2021   History of DVT (deep vein thrombosis) 08/04/2021   History of pulmonary embolus (PE) 67/20/9470   Acute metabolic acidosis 96/28/3662   DKA (diabetic ketoacidosis) ([place]) 08/03/2021   Elevated serum creatinine 04/06/2021   Renal cell carcinoma, right ([place]) 08/05/2020   Thyroid activity decreased    Acute pulmonary embolism ([place]) 12/07/2014   Diabetes mellitus type 2 in nonobese ([place]) 12/07/2014   Cancer ([place])    History of angioplasty    Depression    DVT, lower extremity ([place])    Osteoradionecrosis of mandible    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/20/2011   Pulmonary embolism ([place]) 07/20/2011    ONSET DATE: 07/05/22  REFERRING DIAG: J18.9 (ICD-10-CM) - Community acquired pneumonia of left lung, unspecified part of lung  THERAPY DIAG:  Muscle weakness (generalized)  Other abnormalities of gait and mobility  Rationale for Evaluation and Treatment: Rehabilitation  SUBJECTIVE:                                                                                                                                                                                             SUBJECTIVE STATEMENT: Continuing to feel that I'm getting my energy levels back.  Doing my walks again-doing 15-20 minutes at a time.  Pt reports he feels 60% back to normal.  Pt accompanied by: self  PERTINENT HISTORY: DM, HLD, neuropathy, R RTC repair, stage 4 tongue/squamous cell CA, R renal CA  PAIN:  Are you having pain? 6/10-R knee flares up occasionally  PRECAUTIONS: Other: per patient, aspiration precautions- no bread, no crackers, thin liquids  okay Per fax from Dr. Harrington Challenger 08/08/22 "pt tachy all the time ok to treat if pulse >100."  WEIGHT  BEARING RESTRICTIONS: No  FALLS: Has patient fallen in last 6 months? 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  PLOF: Independent; retired; would like to return to hunting, fishing, woodworking   PATIENT GOALS: would like to return to hunting, fishing, woodworking   OBJECTIVE:     TODAY'S TREATMENT: 09/05/2022 Activity Comments  SciFit, NUStep, level 4, 6 minutes BLEs only for lower extremity warm up Vitals after:  O2 sats 92-94%, HR 102  Forward/back walking x 2 min Good form, no LOB  Sidestepping R and L x 2 min Good form, no LOB  Forward tandem gait x 3 minutes Cues to look ahead at target for stability  Forward march x 2 min Cues to look ahead to target, HR 122 bpm  Forward/back monster walk with yellow band 1 LOB in posterior direction, able to regain balance with PT assist; no other LOB  Standing gastroc (runner's stretch) 3 x 15 sec; stagger stance forward/back rocking for dynamic stretch x 10 reps; seated hamstring stretch   Sit<>stand 8 reps throughout session, min UE support from mat     Access Code: [email or web address] URL: [email or web address] Date: 09/05/2022 Prepared by: [facility]  Exercises - Sit to Stand with Counter Support  - 1 x daily - 5 x weekly - 3 sets - 10 reps - Gastroc Stretch on Wall  - 1 x daily - 5 x weekly - 2 sets - 30 hold - Marching with Resistance  - 1 x daily - 5 x weekly - 2 sets - 10 reps - Standing Hip Abduction with Resistance at Ankles and Counter Support  - 1 x daily - 5 x weekly - 2 sets - 10 reps - Standing Hip Extension with Resistance at Ankles and Counter Support  - 1 x daily - 5 x weekly - 2 sets - 10 reps - Forward and Backward Monster  Walk with Resistance at Ankles and Counter Support  - 1 x daily - 5 x weekly - 1 sets - 5 reps  PATIENT EDUCATION: Education details: HEP addition Person educated: Patient Education method: Explanation, Demonstration, and Handouts Education comprehension: verbalized understanding and returned demonstration   Below measures were taken at time of initial evaluation unless otherwise specified:   DIAGNOSTIC FINDINGS: 07/24/22 chest xray: Similar to slightly increased loculated left hydropneumothorax. Trace thickening of the right inferolateral pleura may represent trace pleural effusion. Similar asymmetrically low left lung volumes with left basilar patchy opacities, likely atelectasis.  COGNITION: Overall cognitive status: Within functional limits for tasks assessed   SENSATION: Reports a little N/T in B feet from neuropathy  COORDINATION: Alternating pronation/supination: WNL B Alternating toe tap: WNL B Finger to nose: WNL B    POSTURE: rounded shoulders, forward head, and increased thoracic kyphosis  LOWER EXTREMITY ROM:     Active  Right Eval Left Eval  Hip flexion    Hip extension    Hip abduction    Hip adduction    Hip internal rotation    Hip external rotation    Knee flexion    Knee extension    Ankle dorsiflexion 5 5  Ankle plantarflexion    Ankle inversion    Ankle eversion     (Blank rows = not tested)  LOWER EXTREMITY MMT:    MMT (in sitting) Right Eval  Left Eval  Hip flexion 4 4-  Hip extension    Hip abduction 4+ 4+  Hip adduction 4- 4  Hip internal rotation    Hip external rotation    Knee flexion 5 4+  Knee extension 5 5  Ankle dorsiflexion 4+ 4+  Ankle plantarflexion 4+ 4+  Ankle inversion    Ankle eversion    (Blank rows = not tested)   GAIT: Gait pattern: Good gait speed and step length but with L lateral trunk lean and thoracic kyphosis  Assistive device utilized: None Level of assistance: Complete Independence  FUNCTIONAL  TESTS:        TODAY'S TREATMENT:                                                                                                                              DATE: 08/02/22    PATIENT EDUCATION: Education details: prognosis, POC, HEP, edu on exam findings  Person educated: Patient Education method: Explanation, Demonstration, Tactile cues, Verbal cues, and Handouts Education comprehension: verbalized understanding  HOME EXERCISE PROGRAM: Access Code: [email or web address] URL: [email or web address] Date: 08/02/2022 Prepared by: [facility]  Exercises - Sit to Stand with Counter Support  - 1 x daily - 5 x weekly - 3 sets - 10 reps - Gastroc Stretch on Wall  - 1 x daily - 5 x weekly - 2 sets - 30 hold - Standing Hip Abduction with Counter Support  - 1 x daily - 5 x weekly - 2 sets - 10 reps - Standing Hip Extension with Counter Support  - 1 x daily - 5 x weekly - 2 sets - 10 reps   GOALS: Goals reviewed with patient? Yes  SHORT TERM GOALS: Target date: 08/23/2022  Patient to be independent with initial HEP. Baseline: HEP initiated Goal status: IN PROGRESS    LONG TERM GOALS: Target date: 09/13/2022  Patient to be independent with advanced HEP. Baseline: Not yet initiated  Goal status: IN PROGRESS  Patient to demonstrate B LE strength >/=4+/5.  Baseline: See above Goal status: IN PROGRESS  Patient to demonstrate 10 degrees B ankle dorsiflexion AROM WFL in order to improve efficiency with gait. Baseline: 5 deg B Goal status: IN PROGRESS  Patient to report tolerance for walking for 45 minutes without fatigue limiting.  Baseline: unable Goal status: IN PROGRESS  Patient to demonstrate 5xSTS test without UE support in <15 sec in order to decrease risk of falls.  Baseline: 13 sec with armrests  Goal status: IN PROGRESS  Patient to walk at least 1312 feet during 6 minute walk test in order to score closer to age-matched norms.   Baseline: 1164 ft 08/11/22 Goal status: IN PROGRESS 08/11/22   ASSESSMENT:  CLINICAL IMPRESSION: Patient continues to report that he is getting his energy levels back, feeling like he's 60% back to his normal.  Focused most of skilled PT session today on dynamic walking activities  with progressive difficulty.  He has 1-2 minor LOB with higher level balance activities. Added to HEP for hip stability exercises with monster walk.  He does require cues for posture, looking ahead to visual target, which does help with balance. Patient is progressing well towards goals and will continue to benefit from skilled PT towards improved overall functional mobility.   OBJECTIVE IMPAIRMENTS: Abnormal gait, decreased activity tolerance, decreased endurance, decreased ROM, decreased strength, dizziness, impaired flexibility, improper body mechanics, and postural dysfunction.   ACTIVITY LIMITATIONS: carrying, lifting, bending, standing, squatting, stairs, bathing, and dressing  PARTICIPATION LIMITATIONS: meal prep, cleaning, laundry, shopping, community activity, yard work, and church  PERSONAL FACTORS: Age, Fitness, Past/current experiences, Time since onset of injury/illness/exacerbation, and 3+ comorbidities: DM, HLD, neuropathy, R RTC repair, stage 4 tongue/squamous cell CA, R renal CA  are also affecting patient's functional outcome.   REHAB POTENTIAL: Good  CLINICAL DECISION MAKING: Evolving/moderate complexity  EVALUATION COMPLEXITY: Moderate  PLAN:  PT FREQUENCY: 1-2x/week  PT DURATION: 6 weeks  PLANNED INTERVENTIONS: Therapeutic exercises, Therapeutic activity, Neuromuscular re-education, Balance training, Gait training, Patient/Family education, Self Care, Joint mobilization, Stair training, Vestibular training, Canalith repositioning, DME instructions, Aquatic Therapy, Dry Needling, Electrical stimulation, Cryotherapy, Moist heat, Taping, Manual therapy, and Re-evaluation  PLAN FOR NEXT  SESSION: Review update to HEP.  Continue towards LTGs.  Monitor spO2 during activity; progress hip strengthening and endurance    Mady Haagensen, PT 09/05/22 2:44 PM Phone: [phone] Fax: [phone]   [facility] [place], [place], [place] [address] Phone # [phone] Fax # [phone]

## 2022-09-11 NOTE — Therapy (Signed)
OUTPATIENT PHYSICAL THERAPY NEURO TREATMENT   Patient Name: [name] MRN: [id number] DOB:1956-08-01, 67 y.o., male Today's Date: 09/11/2022   PCP: [name], MD  REFERRING PROVIDER: [name], MD  END OF SESSION:        Past Medical History:  Diagnosis Date   Alopecia    s/p chemotherapy   Arthritis    Bursitis    pt. 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,stg IV,HPV positive   Diabetes mellitus without complication ([place])    takes Amaryl,Januvia,and Metformin,daily   DVT, lower extremity ([place]) 2009   side effect from chemo and has blood clot left leg-wears compression stockings;was on Coumadin for 92months and has been off 5 yrs   History of chemotherapy    Cisplatin/Taxotere,5-FU   History of radiation therapy 01/28/08-03/11/08   left base tongue   History of shingles    Hyperlipidemia    takes Vytorin daily   Hypothyroidism    takes Synthroid daily   Joint pain    Joint swelling    Neuropathy    left foot   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 denies   Right kidney mass    Seborrheic keratosis    multiple on back   Past Surgical History:  Procedure Laterality Date   BIOPSY  07/07/2022   Procedure: BIOPSY;  Surgeon: Loney Laurence, DO;  Location: [facility];  Service: Gastroenterology;;   CARDIAC CATHETERIZATION     CHEST TUBE INSERTION  07/06/2022   Procedure: CHEST TUBE INSERTION;  Surgeon: Collene Gobble, MD;  Location: [facility];  Service: Cardiopulmonary;;   ESOPHAGOGASTRODUODENOSCOPY (EGD) WITH PROPOFOL N/A 07/07/2022   Procedure: ESOPHAGOGASTRODUODENOSCOPY (EGD) WITH PROPOFOL;  Surgeon: Loney Laurence, DO;  Location: [facility];  Service: Gastroenterology;  Laterality: N/A;   feeding tube placed  2009 and then removed   [place] Right 08/05/2020   Procedure: LYMPH NODE DISSECTION;   Surgeon: Raynelle Bring, MD;  Location: [facility];  Service: Urology;  Laterality: Right;   MOUTH SURGERY     PILONIDAL CYST / SINUS EXCISION  1974   port a cath placement     PORT-A-CATH REMOVAL  2010   right wrist fracture Right 2004   ROBOT ASSISTED LAPAROSCOPIC NEPHRECTOMY Right 08/05/2020   Procedure: XI ROBOTIC ASSISTED LAPAROSCOPIC RADICAL NEPHRECTOMY/PARTIAL ADRENALECTOMY;  Surgeon: Raynelle Bring, MD;  Location: [facility];  Service: Urology;  Laterality: Right;   SHOULDER ARTHROSCOPY WITH DISTAL CLAVICLE RESECTION Right 10/26/2016   Procedure: SHOULDER ARTHROSCOPY WITH DISTAL CLAVICLE RESECTION;  Surgeon: Justice Britain, MD;  Location: [facility];  Service: Orthopedics;  Laterality: Right;   SHOULDER ARTHROSCOPY WITH ROTATOR CUFF REPAIR AND SUBACROMIAL DECOMPRESSION Left 07/16/2014   Procedure: LEFT SHOULDER ARTHROSCOPY WITH ROTATOR CUFF REPAIR AND SUBACROMIAL DECOMPRESSION/DISTAL CLAVICLE RESECTION;  Surgeon: Marin Shutter, MD;  Location: [facility];  Service: Orthopedics;  Laterality: Left;   SHOULDER ARTHROSCOPY WITH ROTATOR CUFF REPAIR AND SUBACROMIAL DECOMPRESSION Right 10/26/2016   Procedure: RIGHT SHOULDER ARTHROSCOPY WITH ROTATOR CUFF REPAIR AND SUBACROMIAL DECOMPRESSION;  Surgeon: Justice Britain, MD;  Location: [facility];  Service: Orthopedics;  Laterality: Right;  requests 2hrs   teeth extraction #9     Patient Active Problem List   Diagnosis Date Noted   Acute respiratory failure with hypoxia ([place]) 07/11/2022   Leukocytosis 07/11/2022   Dysphagia 07/11/2022   Chronic kidney  disease (CKD), stage III (moderate) ([place]) 07/11/2022   Anemia of chronic disease 07/11/2022   Thrombocytosis 07/11/2022   Pleural effusion 07/06/2022   Aspiration pneumonia ([place]) 07/05/2022   AKI (acute kidney injury) ([place]) 08/04/2021   History of DVT (deep vein thrombosis) 08/04/2021   History of pulmonary embolus (PE) 50/05/3817   Acute metabolic acidosis 29/93/7169   DKA (diabetic ketoacidosis) ([place]) 08/03/2021    Elevated serum creatinine 04/06/2021   Renal cell carcinoma, right ([place]) 08/05/2020   Thyroid activity decreased    Acute pulmonary embolism ([place]) 12/07/2014   Diabetes mellitus type 2 in nonobese ([place]) 12/07/2014   Cancer ([place])    History of angioplasty    Depression    DVT, lower extremity ([place])    Osteoradionecrosis of mandible    Malignant neoplasm of base of tongue ([place]) 07/20/2011   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/20/2011   Pulmonary embolism ([place]) 07/20/2011    ONSET DATE: 07/05/22  REFERRING DIAG: J18.9 (ICD-10-CM) - Community acquired pneumonia of left lung, unspecified part of lung  THERAPY DIAG:  No diagnosis found.  Rationale for Evaluation and Treatment: Rehabilitation  SUBJECTIVE:                                                                                                                                                                                             SUBJECTIVE STATEMENT: Continuing to feel that I'm getting my energy levels back.  Doing my walks again-doing 15-20 minutes at a time.  Pt reports he feels 60% back to normal.  Pt accompanied by: self  PERTINENT HISTORY: DM, HLD, neuropathy, R RTC repair, stage 4 tongue/squamous cell CA, R renal CA  PAIN:  Are you having pain? 6/10-R knee flares up occasionally  PRECAUTIONS: Other: per patient, aspiration precautions- no bread, no crackers, thin liquids okay Per fax from Dr. Harrington Challenger 08/08/22 "pt tachy all the time ok to treat if pulse >100."  WEIGHT BEARING RESTRICTIONS: No  FALLS: Has patient fallen in last 6 months? No  LIVING ENVIRONMENT: Lives with: lives with their spouse Lives in: House/apartment Stairs:  3 steps to enter without handrail; 14 steps to 2nd floor  Has following equipment at home: Shower bench  PLOF: Independent; retired; would like to return to hunting, fishing, woodworking   PATIENT GOALS: would like to return to hunting, fishing,  woodworking   OBJECTIVE:     TODAY'S TREATMENT: 09/12/22  LOWER EXTREMITY ROM:     Active  Right Eval Left Eval Right 09/12/22 Left 09/12/22  Hip flexion      Hip extension      Hip  abduction      Hip adduction      Hip internal rotation      Hip external rotation      Knee flexion      Knee extension      Ankle dorsiflexion 5 5    Ankle plantarflexion      Ankle inversion      Ankle eversion       (Blank rows = not tested)  LOWER EXTREMITY MMT:    MMT (in sitting) Right Eval Left Eval Right 09/12/22 Left 09/12/22  Hip flexion 4 4-    Hip extension      Hip abduction 4+ 4+    Hip adduction 4- 4    Hip internal rotation      Hip external rotation      Knee flexion 5 4+    Knee extension 5 5    Ankle dorsiflexion 4+ 4+    Ankle plantarflexion 4+ 4+    Ankle inversion      Ankle eversion      (Blank rows = not tested)    Activity Comments        5xSTS   6 min walk             Access Code: [email or web address] URL: [email or web address] Date: 09/05/2022 Prepared by: [facility]  Exercises - Sit to Stand with Counter Support  - 1 x daily - 5 x weekly - 3 sets - 10 reps - Gastroc Stretch on Wall  - 1 x daily - 5 x weekly - 2 sets - 30 hold - Marching with Resistance  - 1 x daily - 5 x weekly - 2 sets - 10 reps - Standing Hip Abduction with Resistance at Ankles and Counter Support  - 1 x daily - 5 x weekly - 2 sets - 10 reps - Standing Hip Extension with Resistance at Ankles and Counter Support  - 1 x daily - 5 x weekly - 2 sets - 10 reps - Forward and Backward Monster Walk with Resistance at Ankles and Counter Support  - 1 x daily - 5 x weekly - 1 sets - 5 reps    Below measures were taken at time of initial evaluation unless otherwise specified:   DIAGNOSTIC FINDINGS: 07/24/22 chest xray: Similar to slightly increased loculated left hydropneumothorax. Trace thickening of the right inferolateral pleura  may represent trace pleural effusion. Similar asymmetrically low left lung volumes with left basilar patchy opacities, likely atelectasis.  COGNITION: Overall cognitive status: Within functional limits for tasks assessed   SENSATION: Reports a little N/T in B feet from neuropathy  COORDINATION: Alternating pronation/supination: WNL B Alternating toe tap: WNL B Finger to nose: WNL B    POSTURE: rounded shoulders, forward head, and increased thoracic kyphosis  LOWER EXTREMITY ROM:     Active  Right Eval Left Eval  Hip flexion    Hip extension    Hip abduction    Hip adduction    Hip internal rotation    Hip external rotation    Knee flexion    Knee extension    Ankle dorsiflexion 5 5  Ankle plantarflexion    Ankle inversion    Ankle eversion     (Blank rows = not tested)  LOWER EXTREMITY MMT:    MMT (in sitting) Right Eval Left Eval  Hip flexion 4 4-  Hip extension    Hip abduction 4+ 4+  Hip adduction 4-  4  Hip internal rotation    Hip external rotation    Knee flexion 5 4+  Knee extension 5 5  Ankle dorsiflexion 4+ 4+  Ankle plantarflexion 4+ 4+  Ankle inversion    Ankle eversion    (Blank rows = not tested)   GAIT: Gait pattern: Good gait speed and step length but with L lateral trunk lean and thoracic kyphosis  Assistive device utilized: None Level of assistance: Complete Independence  FUNCTIONAL TESTS:        TODAY'S TREATMENT:                                                                                                                              DATE: 08/02/22    PATIENT EDUCATION: Education details: prognosis, POC, HEP, edu on exam findings  Person educated: Patient Education method: Explanation, Demonstration, Tactile cues, Verbal cues, and Handouts Education comprehension: verbalized understanding  HOME EXERCISE PROGRAM: Access Code: [email or web address] URL: [email or web address] Date: 08/02/2022 Prepared by: [facility]  Exercises - Sit to Stand with Counter Support  - 1 x daily - 5 x weekly - 3 sets - 10 reps - Gastroc Stretch on Wall  - 1 x daily - 5 x weekly - 2 sets - 30 hold - Standing Hip Abduction with Counter Support  - 1 x daily - 5 x weekly - 2 sets - 10 reps - Standing Hip Extension with Counter Support  - 1 x daily - 5 x weekly - 2 sets - 10 reps   GOALS: Goals reviewed with patient? Yes  SHORT TERM GOALS: Target date: 08/23/2022  Patient to be independent with initial HEP. Baseline: HEP initiated Goal status: IN PROGRESS    LONG TERM GOALS: Target date: 09/13/2022  Patient to be independent with advanced HEP. Baseline: Not yet initiated  Goal status: IN PROGRESS  Patient to demonstrate B LE strength >/=4+/5.  Baseline: See above Goal status: IN PROGRESS  Patient to demonstrate 10 degrees B ankle dorsiflexion AROM WFL in order to improve efficiency with gait. Baseline: 5 deg B Goal status: IN PROGRESS  Patient to report tolerance for walking for 45 minutes without fatigue limiting.  Baseline: unable Goal status: IN PROGRESS  Patient to demonstrate 5xSTS test without UE support in <15 sec in order to decrease risk of falls.  Baseline: 13 sec with armrests  Goal status: IN PROGRESS  Patient to walk at least 1312 feet during 6 minute walk test in order to score closer to age-matched norms.  Baseline: 1164 ft 08/11/22 Goal status: IN PROGRESS 08/11/22   ASSESSMENT:  CLINICAL IMPRESSION: Patient continues to report that he is getting his energy levels back, feeling like he's 60% back to his normal.  Focused most of skilled PT session today on dynamic walking activities with progressive difficulty.  He has 1-2 minor LOB with higher level balance activities. Added to HEP for hip stability exercises with  monster walk.  He does require cues for posture, looking ahead to visual target, which does help with balance. Patient is progressing  well towards goals and will continue to benefit from skilled PT towards improved overall functional mobility.   OBJECTIVE IMPAIRMENTS: Abnormal gait, decreased activity tolerance, decreased endurance, decreased ROM, decreased strength, dizziness, impaired flexibility, improper body mechanics, and postural dysfunction.   ACTIVITY LIMITATIONS: carrying, lifting, bending, standing, squatting, stairs, bathing, and dressing  PARTICIPATION LIMITATIONS: meal prep, cleaning, laundry, shopping, community activity, yard work, and church  PERSONAL FACTORS: Age, Fitness, Past/current experiences, Time since onset of injury/illness/exacerbation, and 3+ comorbidities: DM, HLD, neuropathy, R RTC repair, stage 4 tongue/squamous cell CA, R renal CA  are also affecting patient's functional outcome.   REHAB POTENTIAL: Good  CLINICAL DECISION MAKING: Evolving/moderate complexity  EVALUATION COMPLEXITY: Moderate  PLAN:  PT FREQUENCY: 1-2x/week  PT DURATION: 6 weeks  PLANNED INTERVENTIONS: Therapeutic exercises, Therapeutic activity, Neuromuscular re-education, Balance training, Gait training, Patient/Family education, Self Care, Joint mobilization, Stair training, Vestibular training, Canalith repositioning, DME instructions, Aquatic Therapy, Dry Needling, Electrical stimulation, Cryotherapy, Moist heat, Taping, Manual therapy, and Re-evaluation  PLAN FOR NEXT SESSION: Review update to HEP.  Continue towards LTGs.  Monitor spO2 during activity; progress hip strengthening and endurance

## 2022-09-12 ENCOUNTER — Encounter: Payer: Self-pay | Admitting: Occupational Therapy

## 2022-09-12 ENCOUNTER — Ambulatory Visit: Payer: Medicare HMO

## 2022-09-12 ENCOUNTER — Encounter: Payer: Self-pay | Admitting: Physical Therapy

## 2022-09-12 ENCOUNTER — Ambulatory Visit: Payer: Medicare HMO | Admitting: Physical Therapy

## 2022-09-12 DIAGNOSIS — R1313 Dysphagia, pharyngeal phase: Secondary | ICD-10-CM

## 2022-09-12 DIAGNOSIS — M6281 Muscle weakness (generalized): Secondary | ICD-10-CM | POA: Diagnosis not present

## 2022-09-12 DIAGNOSIS — R2689 Other abnormalities of gait and mobility: Secondary | ICD-10-CM | POA: Diagnosis not present

## 2022-09-12 NOTE — Therapy (Signed)
OUTPATIENT SPEECH LANGUAGE PATHOLOGY TREAMENT   Patient Name: [name] MRN: [id number] DOB:[birth year], 67 y.o., male Today's Date: 09/12/2022  PCP: [name], MD REFERRING PROVIDER: [name], MD (referring),  [name], MD (documentation)  END OF SESSION:  End of Session - 09/12/22 2350     Visit Number 7    Number of Visits 17    Date for SLP Re-Evaluation 10/11/21    SLP Start Time 1151    SLP Stop Time  1223    SLP Time Calculation (min) 32 min    Activity Tolerance Patient tolerated treatment well                  Past Medical History:  Diagnosis Date   Alopecia    s/p chemotherapy   Arthritis    Bursitis    pt. denies   Cancer ([place]) 10/2007    tongue/squamous cell,stg IV,HPV positive   Diabetes mellitus without complication ([place])    takes Amaryl,Januvia,and Metformin,daily   DVT, lower extremity ([place]) 2009   side effect from chemo and has blood clot left leg-wears compression stockings;was on Coumadin for 5months and has been off 5 yrs   History of chemotherapy    Cisplatin/Taxotere,5-FU   History of radiation therapy 01/28/08-03/11/08   left base tongue   History of shingles    Hyperlipidemia    takes Vytorin daily   Hypothyroidism    takes Synthroid daily   Joint pain    Joint swelling    Neuropathy    left foot   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 denies   Right kidney mass    Seborrheic keratosis    multiple on back   Past Surgical History:  Procedure Laterality Date   BIOPSY  07/07/2022   Procedure: BIOPSY;  Surgeon: Clarence Laurence, DO;  Location: [facility];  Service: Gastroenterology;;   CARDIAC CATHETERIZATION     CHEST TUBE INSERTION  07/06/2022   Procedure: CHEST TUBE INSERTION;  Surgeon: Collene Gobble, MD;  Location: [facility];  Service: Cardiopulmonary;;   ESOPHAGOGASTRODUODENOSCOPY (EGD) WITH PROPOFOL N/A  07/07/2022   Procedure: ESOPHAGOGASTRODUODENOSCOPY (EGD) WITH PROPOFOL;  Surgeon: Clarence Laurence, DO;  Location: [facility];  Service: Gastroenterology;  Laterality: N/A;   feeding tube placed  2009 and then removed   [place] Right 08/05/2020   Procedure: LYMPH NODE DISSECTION;  Surgeon: Clarence Bring, MD;  Location: [facility];  Service: Urology;  Laterality: Right;   MOUTH SURGERY     PILONIDAL CYST / SINUS EXCISION  1974   port a cath placement     PORT-A-CATH REMOVAL  2010   right wrist fracture Right 2004   ROBOT ASSISTED LAPAROSCOPIC NEPHRECTOMY Right 08/05/2020   Procedure: XI ROBOTIC ASSISTED LAPAROSCOPIC RADICAL NEPHRECTOMY/PARTIAL ADRENALECTOMY;  Surgeon: Clarence Bring, MD;  Location: [facility];  Service: Urology;  Laterality: Right;   SHOULDER ARTHROSCOPY WITH DISTAL CLAVICLE RESECTION Right 10/26/2016   Procedure: SHOULDER ARTHROSCOPY WITH DISTAL CLAVICLE RESECTION;  Surgeon: Clarence Britain, MD;  Location: [facility];  Service: Orthopedics;  Laterality: Right;   SHOULDER ARTHROSCOPY WITH ROTATOR CUFF REPAIR AND SUBACROMIAL DECOMPRESSION Left 07/16/2014   Procedure: LEFT SHOULDER ARTHROSCOPY WITH ROTATOR CUFF REPAIR AND SUBACROMIAL DECOMPRESSION/DISTAL CLAVICLE RESECTION;  Surgeon: Clarence Shutter, MD;  Location: [facility];  Service: Orthopedics;  Laterality: Left;   SHOULDER ARTHROSCOPY WITH ROTATOR  CUFF REPAIR AND SUBACROMIAL DECOMPRESSION Right 10/26/2016   Procedure: RIGHT SHOULDER ARTHROSCOPY WITH ROTATOR CUFF REPAIR AND SUBACROMIAL DECOMPRESSION;  Surgeon: Clarence Britain, MD;  Location: [facility];  Service: Orthopedics;  Laterality: Right;  requests 2hrs   teeth extraction #9     Patient Active Problem List   Diagnosis Date Noted   Acute respiratory failure with hypoxia ([place]) 07/11/2022   Leukocytosis 07/11/2022   Dysphagia 07/11/2022   Chronic kidney disease (CKD), stage III (moderate) ([facility]) 07/11/2022   Anemia of chronic disease 07/11/2022    Thrombocytosis 07/11/2022   Pleural effusion 07/06/2022   Aspiration pneumonia ([place]) 07/05/2022   AKI (acute kidney injury) ([place]) 08/04/2021   History of DVT (deep vein thrombosis) 08/04/2021   History of pulmonary embolus (PE) 25/95/6387   Acute metabolic acidosis 56/43/3295   DKA (diabetic ketoacidosis) ([place]) 08/03/2021   Elevated serum creatinine 04/06/2021   Renal cell carcinoma, right ([place]) 08/05/2020   Thyroid activity decreased    Acute pulmonary embolism ([place]) 12/07/2014   Diabetes mellitus type 2 in nonobese ([place]) 12/07/2014   Cancer ([place])    History of angioplasty    Depression    DVT, lower extremity ([place])    Osteoradionecrosis of mandible    Malignant neoplasm of base of tongue ([place]) 07/20/2011   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/20/2011   Pulmonary embolism ([name]) 07/20/2011    ONSET DATE: 2009 after head/neck radiation, script dated 07-14-22   REFERRING DIAG: J18.9 (ICD-10-CM) - Community acquired pneumonia of left lung, unspecified part of lung   THERAPY DIAG:  Dysphagia, pharyngeal phase  Rationale for Evaluation and Treatment: Rehabilitation  SUBJECTIVE:   SUBJECTIVE STATEMENT: "We went to [facility], it took me forever but I got it down."  Pt accompanied by: self  PERTINENT HISTORY: 67 y.o. male presented with worsening shortness of breath and cough. Chest CT 11/1: "Interval worsening of diffuse bronchial wall thickening with  right lower lobe and right middle lobe development of atypical  infection." OP MBS 05/12/2022 with silent aspiration of all consistencies of liquid.  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  Visited  ENT on 03/08/2022 with history as follows: Pt "had base of tongue cancer in 2009 treated with radiation and chemotherapy with cure. He had kidney cancer in 2021. He has noticed more hoarseness with more talking over the past six months. Bulkier foods do not pass very well with swallowing. He coughs a lot when eating. He is able to swallow liquids fine." Pt has a history of left vocal cord paralysis (decreased movmenet of left VF compared to right, good glottic closure, no muscle tension patterns, laryngeal edema is minimal on fiberoptic exam) and tongue cancer. CT on 03/23/22 shows no mass, asymmetric glottic consistent with history of paralysis. No mass on reccurent laryngeal nerve. Pt underwent esophagram on 03/15/22, ordered by Dr Redmond Baseman due to dysphagia. Pt aspirated thick barium and study was terminated. MBS recommended by radiologist.  PAIN:  Are you having pain? Yes: NPRS scale: 4/10 Pain location: bil knees Pain description: sore Aggravating factors: kneeling Relieving factors: walking/stretching   PLOF:  Level of assistance: Independent with ADLs Employment: Retired  PATIENT GOALS: Improve swallowing  OBJECTIVE:   DIAGNOSTIC FINDINGS:  DG CHEST 07/05/22  IMPRESSION:  There is interval appearance of large area of opacification in the left mid and left lower lung fields. Findings suggest pneumonia and large effusion. Part of the effusion appears to be loculated along the lateral chest wall. Possibility of empyema is not excluded. Follow-up CT may be considered.     RECOMMENDATIONS FROM OBJECTIVE SWALLOW STUDY (MBSS/FEES):   Objective swallow impairments: Pt demonstrates an oropharyngeal dysphagia characterized by decreased mobility across pharyngeal structures that leads to mild silent aspiration of liquids, regardless of texture. Pt can achieve adequate glottic closure at the height of the swallow, but epiglottic deflection, reduced laryngeal elevation and reduced UES opening lead to  penetration and residue that falls into the airway just as glottis opens. SLP attempted a variety of compenstory strategies including head turn left, head tilt right, chin tuck, supraglottic swallow. None prevented aspiration. A super-superglotttic swallow may be beneficial, mostly because pt can eject most aspirate with a cued cough and throat clear. With solids there is mild to moderate residue in pyriforms and valleculae due to the the same weakness. Pt usually uses a liquid wash to clear. This is likely the cause of pts globus sensation. A chin tuck with solids did clear a bit more of the stasis than an upright swallow. Effortful swallow not effective. Provided visual feedback to pt and provided extensive education and recommendations post session. Would not recommend any texture modification as there is no beenfit to thickened liquids for this pt. Futhermore, pt has not had any recent repiratory infection that would warrant excessive precaution. However, recommended pt: 1. trial a chin tuck with solids, 2. cough and clear throat frequently with intake even if he doesnt feel the need to do so, 3. complete thorough oral care to reduce bacterial load with aspiration, 4. inform providers of dysphagia and 5. consider f/u with SLP for home exercise program. 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 Exercise and tx targeting pharyngeal musculature could be helpful. Objective recommended compensations:  PRECAUTIONS: Slow rate;Small sips/bites;Follow solids with liquid;Clear throat intermittently;Clear throat after each swallow;Chin tuck BEDSIDE SWALLOW EVAL RESULTS NOVEMBER 2023: Clinical Impression  Pt presents with clinical indicators of pharyngeal dysphagia with known hx of chronic, silent aspiration s/p rad tx for oropharyngeal CA 2009.  Pt completed OP MBSS on 05/12/2022 revealing "oropharyngeal dysphagia characterized by decreased  mobility across pharyngeal structures that leads to mild silent aspiration of liquids, regardless of texture." Recommendations at that time to continue regular diet with thin liquids as modified consistencies were of no benefit.  Aspiration precaution recommendations from MBS as follows: " 1. trial a chin tuck with solids, 2. cough and clear throat frequently with intake even if he doesnt feel the need to do so, 3. complete thorough oral care to reduce bacterial load with aspiration, 4. inform providers of dysphagia and 5. consider f/u with SLP for home exercise program."  These were reviewed with pt.  Pt reports that he did not have any speech therapy follow up after this assessment, but he is agreeable to trial of swallow exercise program in house.  Pt will likely require 6-8 weeks of intensive swallow exercise program to effect change in swallow function and should follow with OP SLP for continued dysphagia management.  Pt with hx VF paralysis.  This was never directly addressed.  Pt's vocal quality is strong and clear in conversation. Today pt exhibited intermittent throat clear with thin liquid and regular solids.  On  palpation, laryngeal elevation was reduced.  Pt achieved greater hyolaryngeal excursion with a sip of water than with voluntary swallow without bolus trial (dry swallow).  Pt notes that soft foods (e.g. applesauce) are easier to eat for him.  There were no clinical s/s of puree.  Even in the absence of clinical s/s of aspiration, there is still a risk for prandial aspiration given hx of silent aspiration of liquid.  Pt would like to resume regular texture diet with thin liquid. He does still c/o of feeling of stasis with solid textures regularly and may benefit from further assessment and possible treatment for esophageal narrowing.  He states he would like to address the feelings of stasis with PO intake.  Consider GI consult if pt is a candidate for espohageal dilation.  Pt likely does not need  a repeat instrumental swallow evaluation at this time.  It is unlikely that swallow function has changed since September, and pt would like to continue oral diet at this time.   Diet Recommendation Regular;Thin liquid  Liquid Administration via: Cup;Straw Medication Administration:  (As tolerated) Supervision: Patient able to self feed  Compensations:  Slow rate; Small sips/bites; Chin tuck with solids; Follow solids with liquid; Clear throat regularly   PATIENT REPORTED OUTCOME MEASURES (PROM): EAT-10: administered today and pt scored 23/30 (lower scores indicate greater QOL from swallowing). "4" was answered for "I cough when I eat", and "swallowing is stressful".   TODAY'S TREATMENT:                                                                                                                                         DATE:  09/12/22: Pt arrives without any overt s/sx aspiration PNA today, very pleased about eating at a steakhouse last week. HEP completed today with independence; pt has been performing HEP x2-3/day, reportedly. With his success with HEP SLP believes pt with this.  He ate fig bar and drank water following precautions, with initial cue to reswallow after intentional cough/throat clear, which he did after subsequent bites/sips.Pt and SLP agree next session pt will likely be d/c'd. SLP inquired about follow up MBS and pt would like to wait at this time and not have another MBS. SLP explained benefits but pt still stated he'd like to wait.  08/30/22: Pt ate dys III and drank H2O via cup today and followed precautions with independence; pt with one coughing episode which he needed to cough it out - "This is the first this has happened in 3 weeks.".  Pt was independent with HEP. He will reduce to once every other week.  08/23/22: Pt ate dys III and drank H2O via cup today and followed precautions with independence.Pt was independent with HEP. He will reduce to once/week beginning  next week.   08/21/22: "I tucked my chin and my daughter in law asked me, 'Are you ok?' I told her yeah,  I'm just eating." Pt ate dys III and drank H2O via cup today and followed precautions with independence. HEP was performed with independence today. If pt's performance remains the same after next session he can decr frequency to once/week.   12:14/23: "The last few days I haven't been spitting like I used to." Pt also reports wife notes he doesn't cough as much during meals. Pt req'd initial mod A for super-supraglottic faded to modified independent. Pt was reminded to get at least 7 reps of each exercise and his target is 30/daily.  08/11/22: Pt without hydrophonic voice today upon entry to [facility]. Pt req'd cues for super-supraglottic, but was indpendent with other 2 exercises. SLP provided pt with overt s/sx aspiration PNA - pt told SLP 3 overt s/sx. With POs, pt followed precautions 100%.   08/02/22: SLP reviewed MBS with pt from September and precautions with POs. Pt req'd cues for alternating bite/sip and clear throat intermittently (SLP encouraged pt to clear throat and reswallow after every bite of solids). By session end pt was modified independent with precautions (handout from SLP being used). SLP then educated pt about three swallow exercises: effortful swallow, Mendelsohn, and Super-supraglottic. Pt was mod A usually, faded to modified independent with all exercises. He was told he will need to do these until 10-05-22 and then another MBS will likely be recommended.   PATIENT EDUCATION: Education details: see "today's treatment" Person educated: Patient Education method: Explanation, Demonstration, Verbal cues, and Handouts Education comprehension: verbalized understanding, returned demonstration, verbal cues required, and needs further education   ASSESSMENT:  CLINICAL IMPRESSION: Patient is a 67 y.o. male who was seen today for treatment of swallowing precautions and of HEP. Pt cont to  progress with HEP and with precautions. SEE TX NOTE for more details. Next session will likely be d/c if [place] remains successful with HEP and is safe with POs and follows precautions. A follow up MBS will be deferred until pt desires.   OBJECTIVE IMPAIRMENTS: include dysphagia. These impairments are limiting patient from safety when swallowing. Factors affecting potential to achieve goals and functional outcome are previous level of function and severity of impairments. Patient will benefit from skilled SLP services to address above impairments and improve overall function.  REHAB POTENTIAL: Fair given above factors   GOALS: Goals reviewed with patient? Yes  SHORT TERM GOALS: Target date: 09-10-22  Pt will complete HEP with rare min A in two sessions Baseline: 08-11-22 Goal status: met  2.  Pt will follow aspiration precautions from [place] 05-12-22 with rare min A in 2 sessions Baseline: 08-11-22 Goal status: met  3.  Pt will tell SLP 3 overt s/sx of aspiration PNA with modified independence Baseline:  Goal status: met  4.  Pt will complete PROM in first two therapy sessions Baseline:  Goal status: met    LONG TERM GOALS: Target date: 10-11-22  Pt will follow aspiration precautions from [facility] 05-12-22 with modified independence in 2 sessions Baseline: 08-11-22 Goal status: met  2.  Pt will complete HEP with modified independence in three sessions Baseline: 08/21/22, 08/23/22 Goal status: met  3.  Pt will achieve improvement in measurement of PROM in last 1-2 sessions Baseline:  Goal status: ongoing  4.  Pt will undergo follow up MBS to assess progress Baseline:  Goal status: deferred- pt will follow up with MBSS in his time   PLAN:  SLP FREQUENCY: 1x/week  SLP DURATION:  17 sessions until 10-11-21  PLANNED INTERVENTIONS: Aspiration precaution training, Pharyngeal  strengthening exercises, Diet toleration management , Environmental controls, Trials of upgraded texture/liquids,  Internal/external aids, SLP instruction and feedback, Compensatory strategies, and Patient/family education    [facility], [place] 09/12/2022, 11:51 PM

## 2022-09-19 ENCOUNTER — Encounter: Payer: Self-pay | Admitting: Occupational Therapy

## 2022-09-19 ENCOUNTER — Ambulatory Visit: Payer: Medicare HMO

## 2022-09-26 ENCOUNTER — Encounter: Payer: Self-pay | Admitting: Occupational Therapy

## 2022-09-26 ENCOUNTER — Ambulatory Visit: Payer: Medicare HMO

## 2022-09-26 DIAGNOSIS — R1313 Dysphagia, pharyngeal phase: Secondary | ICD-10-CM

## 2022-09-26 DIAGNOSIS — M6281 Muscle weakness (generalized): Secondary | ICD-10-CM | POA: Diagnosis not present

## 2022-09-26 DIAGNOSIS — R2689 Other abnormalities of gait and mobility: Secondary | ICD-10-CM | POA: Diagnosis not present

## 2022-09-26 NOTE — Therapy (Signed)
OUTPATIENT SPEECH LANGUAGE PATHOLOGY TREAMENT/Discharge summary   Patient Name: [name] MRN: [id number] DOB:09/16/1955, 67 y.o., male Today's Date: 09/26/2022  PCP: Daisy Floro, MD REFERRING PROVIDER: Rhetta Mura, MD (referring),  Daisy Floro, MD (documentation)  END OF SESSION:  End of Session - 09/26/22 1522     Visit Number 8    Number of Visits 17    Date for SLP Re-Evaluation 10/11/21    SLP Start Time 1447    SLP Stop Time  1519    SLP Time Calculation (min) 32 min    Activity Tolerance Patient tolerated treatment well                   Past Medical History:  Diagnosis Date   Alopecia    s/p chemotherapy   Arthritis    Bursitis    pt. denies   Cancer (HCC) 10/2007    tongue/squamous cell,stg IV,HPV positive   Diabetes mellitus without complication (HCC)    takes Amaryl,Januvia,and Metformin,daily   DVT, lower extremity (HCC) 2009   side effect from chemo and has blood clot left leg-wears compression stockings;was on Coumadin for 89months and has been off 5 yrs   History of chemotherapy    Cisplatin/Taxotere,5-FU   History of radiation therapy 01/28/08-03/11/08   left base tongue   History of shingles    Hyperlipidemia    takes Vytorin daily   Hypothyroidism    takes Synthroid daily   Joint pain    Joint swelling    Neuropathy    left foot   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 denies   Right kidney mass    Seborrheic keratosis    multiple on back   Past Surgical History:  Procedure Laterality Date   BIOPSY  07/07/2022   Procedure: BIOPSY;  Surgeon: Lynann Bologna, DO;  Location: [facility];  Service: Gastroenterology;;   CARDIAC CATHETERIZATION     CHEST TUBE INSERTION  07/06/2022   Procedure: CHEST TUBE INSERTION;  Surgeon: Leslye Peer, MD;  Location: [facility];  Service: Cardiopulmonary;;   ESOPHAGOGASTRODUODENOSCOPY (EGD)  WITH PROPOFOL N/A 07/07/2022   Procedure: ESOPHAGOGASTRODUODENOSCOPY (EGD) WITH PROPOFOL;  Surgeon: Lynann Bologna, DO;  Location: [facility];  Service: Gastroenterology;  Laterality: N/A;   feeding tube placed  2009 and then removed   KNEE SURGERY Left 1993   LYMPH NODE DISSECTION Right 08/05/2020   Procedure: LYMPH NODE DISSECTION;  Surgeon: Heloise Purpura, MD;  Location: [facility];  Service: Urology;  Laterality: Right;   MOUTH SURGERY     PILONIDAL CYST / SINUS EXCISION  1974   port a cath placement     PORT-A-CATH REMOVAL  2010   right wrist fracture Right 2004   ROBOT ASSISTED LAPAROSCOPIC NEPHRECTOMY Right 08/05/2020   Procedure: XI ROBOTIC ASSISTED LAPAROSCOPIC RADICAL NEPHRECTOMY/PARTIAL ADRENALECTOMY;  Surgeon: Heloise Purpura, MD;  Location: [facility];  Service: Urology;  Laterality: Right;   SHOULDER ARTHROSCOPY WITH DISTAL CLAVICLE RESECTION Right 10/26/2016   Procedure: SHOULDER ARTHROSCOPY WITH DISTAL CLAVICLE RESECTION;  Surgeon: Francena Hanly, MD;  Location: [facility];  Service: Orthopedics;  Laterality: Right;   SHOULDER ARTHROSCOPY WITH ROTATOR CUFF REPAIR AND SUBACROMIAL DECOMPRESSION Left 07/16/2014   Procedure: LEFT SHOULDER ARTHROSCOPY WITH ROTATOR CUFF REPAIR AND SUBACROMIAL DECOMPRESSION/DISTAL CLAVICLE RESECTION;  Surgeon: Senaida Lange, MD;  Location: [facility];  Service: Orthopedics;  Laterality: Left;   SHOULDER ARTHROSCOPY  WITH ROTATOR CUFF REPAIR AND SUBACROMIAL DECOMPRESSION Right 10/26/2016   Procedure: RIGHT SHOULDER ARTHROSCOPY WITH ROTATOR CUFF REPAIR AND SUBACROMIAL DECOMPRESSION;  Surgeon: Francena Hanly, MD;  Location: [facility];  Service: Orthopedics;  Laterality: Right;  requests 2hrs   teeth extraction #9     Patient Active Problem List   Diagnosis Date Noted   Acute respiratory failure with hypoxia (HCC) 07/11/2022   Leukocytosis 07/11/2022   Dysphagia 07/11/2022   Chronic kidney disease (CKD), stage III (moderate) (HCC) 07/11/2022   Anemia of chronic disease  07/11/2022   Thrombocytosis 07/11/2022   Pleural effusion 07/06/2022   Aspiration pneumonia (HCC) 07/05/2022   AKI (acute kidney injury) (HCC) 08/04/2021   History of DVT (deep vein thrombosis) 08/04/2021   History of pulmonary embolus (PE) 08/04/2021   Acute metabolic acidosis 08/03/2021   DKA (diabetic ketoacidosis) (HCC) 08/03/2021   Elevated serum creatinine 04/06/2021   Renal cell carcinoma, right (HCC) 08/05/2020   Thyroid activity decreased    Acute pulmonary embolism (HCC) 12/07/2014   Diabetes mellitus type 2 in nonobese (HCC) 12/07/2014   Cancer (HCC)    History of angioplasty    Depression    DVT, lower extremity (HCC)    Osteoradionecrosis of mandible    Malignant neoplasm of base of tongue (HCC) 07/20/2011   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/20/2011   Pulmonary embolism (HCC) 07/20/2011   SPEECH THERAPY DISCHARGE SUMMARY  Visits from Start of Care: 8   Current functional level related to goals / functional outcomes: See below. Pt entered with 23/30 PROM for swallowing, and ended with 4/30, significant positive change indicated.    Remaining deficits: Mild dysphagia, moreso with drier more dense foods.   Education / Equipment: See below. HEP procedure. Compensations with POs, s/sx aspiration PNA  Patient agrees to discharge. Patient goals were met. Patient is being discharged due to meeting the stated rehab goals.. Pt was told he can have Dr. Jenne Pane order follow-up MBS if pt desires.      ONSET DATE: 2009 after head/neck radiation, script dated 07-14-22   REFERRING DIAG: J18.9 (ICD-10-CM) - Community acquired pneumonia of left lung, unspecified part of lung   THERAPY DIAG:  Dysphagia, pharyngeal phase  Rationale for Evaluation and Treatment: Rehabilitation  SUBJECTIVE:   SUBJECTIVE STATEMENT: "I just take little bites, put a little water in my mouth when I had the biscuits, and it worked great."  Pt accompanied  by: self  PERTINENT HISTORY: 67 y.o. male presented with worsening shortness of breath and cough. Chest CT 11/1: "Interval worsening of diffuse bronchial wall thickening with  right lower lobe and right middle lobe development of atypical  infection." OP MBS 05/12/2022 with silent aspiration of all consistencies of liquid.  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  Visited ENT on 03/08/2022 with history as follows: Pt "had base of tongue cancer in 2009 treated with radiation and chemotherapy with cure. He had kidney cancer in 2021. He has noticed more hoarseness with more talking over the past six months. Bulkier foods do not pass very well with swallowing. He coughs a lot when eating. He is able to swallow liquids fine." Pt has a history of left vocal cord paralysis (decreased movmenet of left VF compared  to right, good glottic closure, no muscle tension patterns, laryngeal edema is minimal on fiberoptic exam) and tongue cancer. CT on 03/23/22 shows no mass, asymmetric glottic consistent with history of paralysis. No mass on reccurent laryngeal nerve. Pt underwent esophagram on 03/15/22, ordered by Dr Jenne Pane due to dysphagia. Pt aspirated thick barium and study was terminated. MBS recommended by radiologist.  PAIN:  Are you having pain? Yes: NPRS scale: 4/10 Pain location: bil knees Pain description: sore Aggravating factors: kneeling Relieving factors: walking/stretching   PLOF:  Level of assistance: Independent with ADLs Employment: Retired  PATIENT GOALS: Improve swallowing  OBJECTIVE:   DIAGNOSTIC FINDINGS:  DG CHEST 07/05/22  IMPRESSION: There is interval appearance of large area of  opacification in the left mid and left lower lung fields. Findings suggest pneumonia and large effusion. Part of the effusion appears to be loculated along the lateral chest wall. Possibility of empyema is not excluded. Follow-up CT may be considered.     RECOMMENDATIONS FROM OBJECTIVE SWALLOW STUDY (MBSS/FEES):   Objective swallow impairments: Pt demonstrates an oropharyngeal dysphagia characterized by decreased mobility across pharyngeal structures that leads to mild silent aspiration of liquids, regardless of texture. Pt can achieve adequate glottic closure at the height of the swallow, but epiglottic deflection, reduced laryngeal elevation and reduced UES opening lead to penetration and residue that falls into the airway just as glottis opens. SLP attempted a variety of compenstory strategies including head turn left, head tilt right, chin tuck, supraglottic swallow. None prevented aspiration. A super-superglotttic swallow may be beneficial, mostly because pt can eject most aspirate with a cued cough and throat clear. With solids there is mild to moderate residue in pyriforms and valleculae due to the the same weakness. Pt usually uses a liquid wash to clear. This is likely the cause of pts globus sensation. A chin tuck with solids did clear a bit more of the stasis than an upright swallow. Effortful swallow not effective. Provided visual feedback to pt and provided extensive education and recommendations post session. Would not recommend any texture modification as there is no beenfit to thickened liquids for this pt. Futhermore, pt has not had any recent repiratory infection that would warrant excessive precaution. However, recommended pt: 1. trial a chin tuck with solids, 2. cough and clear throat frequently with intake even if he doesnt feel the need to do so, 3. complete thorough oral care to reduce bacterial load with aspiration, 4. inform providers of dysphagia and 5. consider f/u with SLP for  home exercise program. 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 Exercise and tx targeting pharyngeal musculature could be helpful. Objective recommended compensations:  PRECAUTIONS: Slow rate;Small sips/bites;Follow solids with liquid;Clear throat intermittently;Clear throat after each swallow;Chin tuck BEDSIDE SWALLOW EVAL RESULTS NOVEMBER 2023: Clinical Impression  Pt presents with clinical indicators of pharyngeal dysphagia with known hx of chronic, silent aspiration s/p rad tx for oropharyngeal CA 2009.  Pt completed OP MBSS on 05/12/2022 revealing "oropharyngeal dysphagia characterized by decreased mobility across pharyngeal structures that leads to mild silent aspiration of liquids, regardless of texture." Recommendations at that time to continue regular diet with thin liquids as modified consistencies were of no benefit.  Aspiration precaution recommendations from MBS as follows: " 1. trial a chin tuck with solids, 2. cough and clear throat frequently with intake even if he doesnt feel the need to do so, 3. complete thorough oral care to reduce bacterial load  with aspiration, 4. inform providers of dysphagia and 5. consider f/u with SLP for home exercise program."  These were reviewed with pt.  Pt reports that he did not have any speech therapy follow up after this assessment, but he is agreeable to trial of swallow exercise program in house.  Pt will likely require 6-8 weeks of intensive swallow exercise program to effect change in swallow function and should follow with OP SLP for continued dysphagia management.  Pt with hx VF paralysis.  This was never directly addressed.  Pt's vocal quality is strong and clear in conversation. Today pt exhibited intermittent throat clear with thin liquid and regular solids.  On palpation, laryngeal elevation was reduced.  Pt achieved greater hyolaryngeal excursion with a sip of water than  with voluntary swallow without bolus trial (dry swallow).  Pt notes that soft foods (e.g. applesauce) are easier to eat for him.  There were no clinical s/s of puree.  Even in the absence of clinical s/s of aspiration, there is still a risk for prandial aspiration given hx of silent aspiration of liquid.  Pt would like to resume regular texture diet with thin liquid. He does still c/o of feeling of stasis with solid textures regularly and may benefit from further assessment and possible treatment for esophageal narrowing.  He states he would like to address the feelings of stasis with PO intake.  Consider GI consult if pt is a candidate for espohageal dilation.  Pt likely does not need a repeat instrumental swallow evaluation at this time.  It is unlikely that swallow function has changed since September, and pt would like to continue oral diet at this time.   Diet Recommendation Regular;Thin liquid  Liquid Administration via: Cup;Straw Medication Administration:  (As tolerated) Supervision: Patient able to self feed  Compensations:  Slow rate; Small sips/bites; Chin tuck with solids; Follow solids with liquid; Clear throat regularly   PATIENT REPORTED OUTCOME MEASURES (PROM): EAT-10: administered today and pt scored 23/30 (lower scores indicate greater QOL from swallowing). "4" was answered for "I cough when I eat", and "swallowing is stressful".   TODAY'S TREATMENT:                                                                                                                                         DATE:  09/26/22: EAT-10 was provided to pt today and he scored 4/30 - significant improvement from 23/30 which pt scored initially.  Pt arrives without any overt s/sx aspiration PNA today; None reported. Pt reports eating bread/biscuits and gravy and feeling pleased with this (see "s" statement). Today pt ate peanut butter crackers ("I have those all the time at home - I love them!") with  precautions with modified independence. Rare need to hock bolus back into oral cavity and re-chew and swallow. SLP cued pt to think about chewing more and then swallowing and he may  need to hock and re-chew/swallow. With HEP, SLP told pt to cont with this for 2 more weeks and then complete x3/week indefinitely. If pt desires another MBS he was told he could contact Dr. Jenne Pane to order. Pt demo'd understanding. He agreed with d/c today.   09/12/22: Pt arrives without any overt s/sx aspiration PNA today, very pleased about eating at a steakhouse last week. HEP completed today with independence; pt has been performing HEP x2-3/day, reportedly. With his success with HEP SLP believes pt with this.  He ate fig bar and drank water following precautions, with initial cue to reswallow after intentional cough/throat clear, which he did after subsequent bites/sips.Pt and SLP agree next session pt will likely be d/c'd. SLP inquired about follow up MBS and pt would like to wait at this time and not have another MBS. SLP explained benefits but pt still stated he'd like to wait.  08/30/22: Pt ate dys III and drank H2O via cup today and followed precautions with independence; pt with one coughing episode which he needed to cough it out - "This is the first this has happened in 3 weeks.".  Pt was independent with HEP. He will reduce to once every other week.  08/23/22: Pt ate dys III and drank H2O via cup today and followed precautions with independence.Pt was independent with HEP. He will reduce to once/week beginning next week.   08/21/22: "I tucked my chin and my daughter in law asked me, 'Are you ok?' I told her yeah, I'm just eating." Pt ate dys III and drank H2O via cup today and followed precautions with independence. HEP was performed with independence today. If pt's performance remains the same after next session he can decr frequency to once/week.   12:14/23: "The last few days I haven't been spitting like I used  to." Pt also reports wife notes he doesn't cough as much during meals. Pt req'd initial mod A for super-supraglottic faded to modified independent. Pt was reminded to get at least 7 reps of each exercise and his target is 30/daily.  08/11/22: Pt without hydrophonic voice today upon entry to ST. Pt req'd cues for super-supraglottic, but was indpendent with other 2 exercises. SLP provided pt with overt s/sx aspiration PNA - pt told SLP 3 overt s/sx. With POs, pt followed precautions 100%.   08/02/22: SLP reviewed MBS with pt from September and precautions with POs. Pt req'd cues for alternating bite/sip and clear throat intermittently (SLP encouraged pt to clear throat and reswallow after every bite of solids). By session end pt was modified independent with precautions (handout from SLP being used). SLP then educated pt about three swallow exercises: effortful swallow, Mendelsohn, and Super-supraglottic. Pt was mod A usually, faded to modified independent with all exercises. He was told he will need to do these until 10-05-22 and then another MBS will likely be recommended.   PATIENT EDUCATION: Education details: see "today's treatment" Person educated: Patient Education method: Explanation, Demonstration, Verbal cues, and Handouts Education comprehension: verbalized understanding, returned demonstration, verbal cues required, and needs further education   ASSESSMENT:  CLINICAL IMPRESSION: Patient is a 67 y.o. male who was seen today for treatment of swallowing precautions and of HEP. Pt cont to progress with HEP and with precautions. SEE TX NOTE for more details. Next session will likely be d/c if Hilmer remains successful with HEP and is safe with POs and follows precautions. A follow up MBS will be deferred until pt desires.   OBJECTIVE IMPAIRMENTS: include  dysphagia. These impairments are limiting patient from safety when swallowing. Factors affecting potential to achieve goals and functional  outcome are previous level of function and severity of impairments. Patient will benefit from skilled SLP services to address above impairments and improve overall function.  REHAB POTENTIAL: Fair given above factors   GOALS: Goals reviewed with patient? Yes  SHORT TERM GOALS: Target date: 09-10-22  Pt will complete HEP with rare min A in two sessions Baseline: 08-11-22 Goal status: met  2.  Pt will follow aspiration precautions from MBS 05-12-22 with rare min A in 2 sessions Baseline: 08-11-22 Goal status: met  3.  Pt will tell SLP 3 overt s/sx of aspiration PNA with modified independence Baseline:  Goal status: met  4.  Pt will complete PROM in first two therapy sessions Baseline:  Goal status: met    LONG TERM GOALS: Target date: 10-11-22  Pt will follow aspiration precautions from [facility] 05-12-22 with modified independence in 2 sessions Baseline: 08-11-22 Goal status: met  2.  Pt will complete HEP with modified independence in three sessions Baseline: 08/21/22, 08/23/22 Goal status: met  3.  Pt will achieve improvement in measurement of PROM in last 1-2 sessions Baseline:  Goal status: met  4.  Pt will undergo follow up MBS to assess progress Baseline:  Goal status: deferred- pt will follow up with MBSS in his time   PLAN:  SLP FREQUENCY: 1x/week  SLP DURATION:  17 sessions until 10-11-21  PLANNED INTERVENTIONS: Aspiration precaution training, Pharyngeal strengthening exercises, Diet toleration management , Environmental controls, Trials of upgraded texture/liquids, Internal/external aids, SLP instruction and feedback, Compensatory strategies, and Patient/family education    [facility], CCC-SLP 09/26/2022, 3:23 PM

## 2022-10-31 NOTE — Progress Notes (Signed)
$'@Patient'T$  ID: [name], male    DOB: 21-Aug-1956, 67 y.o.   MRN: [id number]  Chief Complaint  Patient presents with   Follow-up    Pt is here for follow up from [facility] visit for PNA. Chest xray was done before visit. Pt states he is feeling better since last visit and he knows it will take a while to gain his strength back.     Referring provider: [name], MD  HPI: 67 year old male, never smoked. PMH significant for CAP, acute respiratory failure, hx DVT/PE in 2016, malignant neoplasm base tongue, pleural effusion, dysphagia, type 2 diabetes, renal cell carcinoma.   08/08/2022 Returns for routine follow-up.  Slowly getting better.  Symptoms getting better.  Cough meds gone.  Slowly building up strength.  Still a bit short of breath and feels like lack of stamina.  Reviewed multiple imaging in the hospital with his pneumonia etc.  Chest tube etc.  All improving.  Review chest x-ray last visit 07/24/2022 with interval improvement again.  Will repeat chest x-ray today to establish new baseline now several weeks out from therapy.  OV [name] 07/2022 hospital follow up Patient presents today for hospital follow-up. He was admitted on 07/05/22 for CAP.  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  Renal cell carcinoma with right nephrectomy 2021.  Ongoing evaluation for dysphagia with barium swallow indicative of chronic aspiration and narrowing of the esophagus.     Admitted from 07/05/22-07/14/22 for aspiration pneumonia, loculated parapneumonic pleural effusion/empyema. Neutrophil predominant, exudative. Chest tube placed 07/06/22 per pulmonary.  Given fibrinolytic's of pleural tube several times through 07/08/22- 07/10/22. Chest tube pulled on 07/14/22. CXR stable. WBC trending down.  He is on Augmentin through 07/25/22.   Overall he is feeling better. He is still coughing up mucus but cough has improved. He  has mild shortness of breath symptoms with moderate exertion. He is able to do most ADLs; he recently showered, shaved and got a hair cut. He still reports episodes of aspiration but not as significant as he previously was. He thinks bread was causing this. He will be seeing speech therapy next week, he is practice daily exercises and has noticed improvement. Denies pleuritic pain left side. He has 2 more doses of Augmentin. Needs CXR today.    No Known Allergies  Immunization History  Administered Date(s) Administered   Moderna Covid-19 Vaccine Bivalent Booster 25yr & up 06/09/2021   Moderna SARS-COV2 Booster Vaccination 12/30/2020   PFIZER(Purple Top)SARS-COV-2 Vaccination 06/22/2020    Past Medical History:  Diagnosis Date   Alopecia    s/p chemotherapy   Arthritis    Bursitis    pt. denies   [place] [place]    tongue/squamous cell,stg IV,HPV positive   Diabetes mellitus without complication ([facility]    takes Amaryl,Januvia,and Metformin,daily   [place]   side effect from chemo and has blood clot left leg-wears compression stockings;was on Coumadin for 88monthand has been off 5 yrs   History of chemotherapy    Cisplatin/Taxotere,5-FU   History of radiation therapy 01/28/08-03/11/08   left base tongue   History of shingles    Hyperlipidemia    takes Vytorin daily   Hypothyroidism    takes Synthroid daily   Joint pain    Joint swelling    Neuropathy    left foot   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 denies   Right kidney mass    Seborrheic keratosis    multiple on back    Tobacco History: Social History   Tobacco Use  Smoking Status Never   Passive exposure: Past  Smokeless Tobacco Never   Counseling given: Not Answered   Outpatient Medications Prior to Visit  Medication Sig Dispense Refill   Dulaglutide (TRULICITY) 1.5 0000000 SOPN Inject 1 pen under the  skin once a week (Patient taking differently: Inject 1.5 mg into the skin every Sunday.) 6 mL 4   ezetimibe-simvastatin (VYTORIN) 10-20 MG tablet Take 1 tablet by mouth once daily at night. 90 tablet 4   glucose blood test strip Use to check blood sugar twice daily 100 each 4   insulin detemir (LEVEMIR) 100 UNIT/ML FlexPen Inject 22 units into the skin 2 times a day (Patient taking differently: Inject 22 Units into the skin 2 (two) times daily.) 15 mL 4   Lancets (FREESTYLE) lancets Use to check blood sugar twice a day. 100 each 4   levothyroxine (SYNTHROID) 75 MCG tablet Take 1 tablet by mouth every morning on an empty stomach (Patient taking differently: Take 75 mcg by mouth daily before breakfast.) 90 tablet 3   Multiple Vitamin (MULTIVITAMIN) tablet Take 1 tablet by mouth daily.     pantoprazole (PROTONIX) 40 MG tablet Take 1 tablet (40 mg total) by mouth 2 (two) times daily. 60 tablet 1   rivaroxaban (XARELTO) 10 MG TABS tablet TAKE 1 TABLET BY MOUTH ONCE DAILY WITH FOOD. (Patient taking differently: Take 10 mg by mouth in the morning.) 90 tablet 4   Insulin Pen Needle 32G X 4 MM MISC Use as directed once daily 100 each 3   acetaminophen (TYLENOL) 500 MG tablet Take 1,000 mg by mouth daily as needed for mild pain or moderate pain. (Patient not taking: Reported on 07/24/2022)     methocarbamol (ROBAXIN) 750 MG tablet Take 1 tablet (750 mg total) by mouth every 4 (four) hours as needed for tight muscles. (Patient not taking: Reported on 07/24/2022) 90 tablet 1   repaglinide (PRANDIN) 1 MG tablet Take 1/2 to 1 tablet by mouth 15 to 30 minutes before meals twice a day (Patient not taking: Reported on 08/02/2022) 90 tablet 6   Facility-Administered Medications Prior to Visit  Medication Dose Route Frequency Provider Last Rate Last Admin   magnesium citrate solution 0.5 Bottle  0.5 Bottle Oral Once Clarence Bring, MD        Review of Systems  N/a  Physical Exam  BP 126/70 (BP Location: Left  Arm, Patient Position: Sitting, Cuff Size: Normal)   Pulse (!) 113   Ht '6\' 2"'$  (1.88 m)   Wt 177 lb 3.2 oz (80.4 kg)   SpO2 98%   BMI 22.75 kg/m  Physical Exam Constitutional:      Appearance: Normal appearance.  HENT:     Head: Normocephalic and atraumatic.     Mouth/Throat:     Mouth: Mucous membranes are moist.     Pharynx: Oropharynx is clear.  Cardiovascular:     Rate and Rhythm: Tachycardia present.  Pulmonary:     Effort: Pulmonary effort is normal. No respiratory distress.  Musculoskeletal:        General: Normal range of motion.  Skin:    General: Skin is warm and dry.  Neurological:     General: No focal deficit present.     Mental Status: He  is alert and oriented to person, place, and time. Mental status is at baseline.  Psychiatric:        Mood and Affect: Mood normal.        Behavior: Behavior normal.        Thought Content: Thought content normal.        Judgment: Judgment normal.      Lab Results: Personally reviewed CBC    Component Value Date/Time   WBC 8.4 07/24/2022 1519   RBC 4.07 (L) 07/24/2022 1519   HGB 11.2 (L) 07/24/2022 1519   HGB 12.0 (L) 04/05/2022 1304   HGB 15.8 11/18/2012 1331   HCT 34.9 (L) 07/24/2022 1519   HCT 47.5 11/18/2012 1331   PLT 541.0 (H) 07/24/2022 1519   PLT 200 04/05/2022 1304   PLT 201 11/18/2012 1331   MCV 85.6 07/24/2022 1519   MCV 88.6 11/18/2012 1331   MCH 27.3 07/14/2022 0407   MCHC 32.0 07/24/2022 1519   RDW 14.5 07/24/2022 1519   RDW 12.9 11/18/2012 1331   LYMPHSABS 1.0 07/24/2022 1519   LYMPHSABS 0.8 (L) 11/18/2012 1331   MONOABS 0.8 07/24/2022 1519   MONOABS 0.4 11/18/2012 1331   EOSABS 0.1 07/24/2022 1519   EOSABS 0.1 11/18/2012 1331   BASOSABS 0.1 07/24/2022 1519   BASOSABS 0.0 11/18/2012 1331    BMET    Component Value Date/Time   NA 137 07/24/2022 1519   NA 136 11/18/2012 1331   K 4.9 07/24/2022 1519   K 4.7 11/18/2012 1331   CL 98 07/24/2022 1519   CL 98 11/18/2012 1331   CO2 30  07/24/2022 1519   CO2 27 11/18/2012 1331   GLUCOSE 154 (H) 07/24/2022 1519   GLUCOSE 612 Repeated and Verified (HH) 11/18/2012 1331   BUN 13 07/24/2022 1519   BUN 24.6 11/18/2012 1331   CREATININE 1.62 (H) 07/24/2022 1519   CREATININE 2.33 (H) 04/05/2022 1304   CREATININE 1.7 (H) 11/18/2012 1331   CALCIUM 8.9 07/24/2022 1519   CALCIUM 9.9 11/18/2012 1331   GFRNONAA 43 (L) 07/14/2022 0407   GFRNONAA 30 (L) 04/05/2022 1304   GFRAA 58 (L) 06/05/2020 2336    BNP No results found for: "BNP"  ProBNP No results found for: "PROBNP"  Imaging: No results found.   Assessment & Plan:   Pneumonia/parapneumonic effusion: Improving over time.  Chest imaging improvement.  Repeat chest x-ray today.  Follow-up as needed.  Lanier Clam, MD

## 2022-11-06 ENCOUNTER — Encounter: Payer: Self-pay | Admitting: Otolaryngology

## 2022-11-06 ENCOUNTER — Other Ambulatory Visit (HOSPITAL_COMMUNITY): Payer: Self-pay

## 2022-11-06 DIAGNOSIS — R1313 Dysphagia, pharyngeal phase: Secondary | ICD-10-CM

## 2022-11-15 ENCOUNTER — Other Ambulatory Visit: Payer: Self-pay

## 2022-11-16 DIAGNOSIS — D631 Anemia in chronic kidney disease: Secondary | ICD-10-CM | POA: Diagnosis not present

## 2022-11-16 DIAGNOSIS — Z85528 Personal history of other malignant neoplasm of kidney: Secondary | ICD-10-CM | POA: Diagnosis not present

## 2022-11-16 DIAGNOSIS — N2581 Secondary hyperparathyroidism of renal origin: Secondary | ICD-10-CM | POA: Diagnosis not present

## 2022-11-16 DIAGNOSIS — R809 Proteinuria, unspecified: Secondary | ICD-10-CM | POA: Diagnosis not present

## 2022-11-16 DIAGNOSIS — N183 Chronic kidney disease, stage 3 unspecified: Secondary | ICD-10-CM | POA: Diagnosis not present

## 2022-11-16 DIAGNOSIS — I129 Hypertensive chronic kidney disease with stage 1 through stage 4 chronic kidney disease, or unspecified chronic kidney disease: Secondary | ICD-10-CM | POA: Diagnosis not present

## 2022-11-23 ENCOUNTER — Telehealth (HOSPITAL_COMMUNITY): Payer: Self-pay | Admitting: Family Medicine

## 2022-11-23 ENCOUNTER — Other Ambulatory Visit (HOSPITAL_COMMUNITY): Payer: Self-pay

## 2022-11-23 DIAGNOSIS — R1310 Dysphagia, unspecified: Secondary | ICD-10-CM

## 2022-11-23 NOTE — Telephone Encounter (Signed)
Attempted to contact patient to schedule OP Modified Barium Swallow - left voicemail. 

## 2022-11-30 DIAGNOSIS — N183 Chronic kidney disease, stage 3 unspecified: Secondary | ICD-10-CM | POA: Diagnosis not present

## 2022-12-07 ENCOUNTER — Ambulatory Visit (HOSPITAL_COMMUNITY)
Admission: RE | Admit: 2022-12-07 | Discharge: 2022-12-07 | Disposition: A | Discharge status: Home / Self Care | Payer: Medicare HMO | Source: Ambulatory Visit | Attending: Family Medicine | Admitting: Family Medicine

## 2022-12-07 DIAGNOSIS — R1312 Dysphagia, oropharyngeal phase: Secondary | ICD-10-CM | POA: Diagnosis not present

## 2022-12-07 DIAGNOSIS — Z85810 Personal history of malignant neoplasm of tongue: Secondary | ICD-10-CM | POA: Insufficient documentation

## 2022-12-07 DIAGNOSIS — R1310 Dysphagia, unspecified: Secondary | ICD-10-CM

## 2022-12-07 DIAGNOSIS — T17320A Food in larynx causing asphyxiation, initial encounter: Secondary | ICD-10-CM | POA: Diagnosis not present

## 2022-12-07 DIAGNOSIS — R6339 Other feeding difficulties: Secondary | ICD-10-CM | POA: Diagnosis not present

## 2022-12-20 DIAGNOSIS — R1314 Dysphagia, pharyngoesophageal phase: Secondary | ICD-10-CM | POA: Diagnosis not present

## 2022-12-20 DIAGNOSIS — Z85810 Personal history of malignant neoplasm of tongue: Secondary | ICD-10-CM | POA: Diagnosis not present

## 2023-01-26 ENCOUNTER — Encounter (HOSPITAL_BASED_OUTPATIENT_CLINIC_OR_DEPARTMENT_OTHER): Payer: Self-pay | Admitting: Otolaryngology

## 2023-01-26 ENCOUNTER — Other Ambulatory Visit: Payer: Self-pay

## 2023-01-26 DIAGNOSIS — R590 Localized enlarged lymph nodes: Secondary | ICD-10-CM | POA: Diagnosis not present

## 2023-01-26 DIAGNOSIS — R911 Solitary pulmonary nodule: Secondary | ICD-10-CM | POA: Diagnosis not present

## 2023-01-26 DIAGNOSIS — C641 Malignant neoplasm of right kidney, except renal pelvis: Secondary | ICD-10-CM | POA: Diagnosis not present

## 2023-01-26 DIAGNOSIS — J90 Pleural effusion, not elsewhere classified: Secondary | ICD-10-CM | POA: Diagnosis not present

## 2023-01-30 ENCOUNTER — Encounter (HOSPITAL_BASED_OUTPATIENT_CLINIC_OR_DEPARTMENT_OTHER)
Admission: RE | Admit: 2023-01-30 | Discharge: 2023-01-30 | Disposition: A | Discharge status: Home / Self Care | Payer: Medicare HMO | Source: Ambulatory Visit | Attending: Otolaryngology | Admitting: Otolaryngology

## 2023-01-30 DIAGNOSIS — E119 Type 2 diabetes mellitus without complications: Secondary | ICD-10-CM | POA: Insufficient documentation

## 2023-01-30 DIAGNOSIS — Z01812 Encounter for preprocedural laboratory examination: Secondary | ICD-10-CM | POA: Diagnosis not present

## 2023-01-30 LAB — BASIC METABOLIC PANEL
Anion gap: 4 — ABNORMAL LOW (ref 5–15)
BUN: 29 mg/dL — ABNORMAL HIGH (ref 8–23)
CO2: 22 mmol/L (ref 22–32)
Calcium: 8.5 mg/dL — ABNORMAL LOW (ref 8.9–10.3)
Chloride: 109 mmol/L (ref 98–111)
Creatinine, Ser: 1.82 mg/dL — ABNORMAL HIGH (ref 0.61–1.24)
GFR, Estimated: 40 mL/min — ABNORMAL LOW (ref >60)
Glucose, Bld: 247 mg/dL — ABNORMAL HIGH (ref 70–99)
Potassium: 5.1 mmol/L (ref 3.5–5.1)
Sodium: 135 mmol/L (ref 135–145)

## 2023-02-02 DIAGNOSIS — C641 Malignant neoplasm of right kidney, except renal pelvis: Secondary | ICD-10-CM | POA: Diagnosis not present

## 2023-02-04 NOTE — Anesthesia Preprocedure Evaluation (Addendum)
Anesthesia Evaluation  Patient identified by MRN, date of birth, ID band Patient awake    Reviewed: Allergy & Precautions, NPO status , Patient's Chart, lab work & pertinent test results, reviewed documented beta blocker date and time   Airway Mallampati: II  TM Distance: >3 FB Neck ROM: Limited    Dental  (+) Missing, Dental Advisory Given   Pulmonary pneumonia, resolved, PE Hx/o PTE   Pulmonary exam normal breath sounds clear to auscultation       Cardiovascular + DVT   Rhythm:Regular Rate:Tachycardia  Hx/o DVT while on Chemotherapy took Coumadin for 8 mos.  EKG 07/05/22 ST, abnormal R wave progression- early transition  Echo 12/08/2014 - Left ventricle: The cavity size was normal. Systolic function was    normal. The estimated ejection fraction was in the range of 55%    to 60%. Wall motion was normal; there were no regional wall    motion abnormalities. There was an increased relative    contribution of atrial contraction to ventricular filling.    Doppler parameters are consistent with abnormal left ventricular    relaxation (grade 1 diastolic dysfunction).  - Aortic valve: Trileaflet; normal thickness, mildly calcified    leaflets.  - Right ventricle: Poorly visualized.      Neuro/Psych  PSYCHIATRIC DISORDERS  Depression    Neuropathy left foot    GI/Hepatic Neg liver ROS,,,Pharyngoesophageal dysphagia  History of primary malignant neoplasm of base of tongue S/P chemoRx and RT    Endo/Other  diabetes, Well Controlled, Type 2, Insulin DependentHypothyroidism  Glp-1 RA therapy- last dose 5/26 Hyperlipidemia  Renal/GU Renal diseaseHx/o renal cell carcinoma  negative genitourinary   Musculoskeletal  (+) Arthritis , Osteoarthritis,  Hx/o mandibular osteonecrosis S/P RT   Abdominal   Peds  Hematology  (+) Blood dyscrasia, anemia Xarelto therapy- last dose 5/24   Anesthesia Other Findings    Reproductive/Obstetrics                             Anesthesia Physical Anesthesia Plan  ASA: 3  Anesthesia Plan: General   Post-op Pain Management: Minimal or no pain anticipated   Induction: Intravenous  PONV Risk Score and Plan: 2 and Treatment may vary due to age or medical condition and TIVA  Airway Management Planned: Natural Airway and Oral ETT  Additional Equipment: None  Intra-op Plan:   Post-operative Plan: Extubation in OR  Informed Consent: I have reviewed the patients History and Physical, chart, labs and discussed the procedure including the risks, benefits and alternatives for the proposed anesthesia with the patient or authorized representative who has indicated his/her understanding and acceptance.     Dental advisory given  Plan Discussed with: Anesthesiologist and CRNA  Anesthesia Plan Comments:         Anesthesia Quick Evaluation

## 2023-02-05 ENCOUNTER — Other Ambulatory Visit: Payer: Self-pay | Admitting: Otolaryngology

## 2023-02-05 DIAGNOSIS — I10 Essential (primary) hypertension: Secondary | ICD-10-CM | POA: Diagnosis not present

## 2023-02-05 DIAGNOSIS — E7800 Pure hypercholesterolemia, unspecified: Secondary | ICD-10-CM | POA: Diagnosis not present

## 2023-02-05 DIAGNOSIS — D473 Essential (hemorrhagic) thrombocythemia: Secondary | ICD-10-CM | POA: Diagnosis not present

## 2023-02-05 DIAGNOSIS — E1169 Type 2 diabetes mellitus with other specified complication: Secondary | ICD-10-CM | POA: Diagnosis not present

## 2023-02-06 ENCOUNTER — Encounter (HOSPITAL_BASED_OUTPATIENT_CLINIC_OR_DEPARTMENT_OTHER)
Admission: RE | Disposition: A | Discharge status: Home / Self Care | Payer: Self-pay | Source: Ambulatory Visit | Attending: Otolaryngology

## 2023-02-06 ENCOUNTER — Ambulatory Visit (HOSPITAL_BASED_OUTPATIENT_CLINIC_OR_DEPARTMENT_OTHER): Payer: Medicare HMO | Admitting: Anesthesiology

## 2023-02-06 ENCOUNTER — Ambulatory Visit (HOSPITAL_BASED_OUTPATIENT_CLINIC_OR_DEPARTMENT_OTHER)
Admission: RE | Admit: 2023-02-06 | Discharge: 2023-02-06 | Disposition: A | Discharge status: Home / Self Care | Payer: Medicare HMO | Source: Ambulatory Visit | Attending: Otolaryngology | Admitting: Otolaryngology

## 2023-02-06 ENCOUNTER — Other Ambulatory Visit: Payer: Self-pay

## 2023-02-06 ENCOUNTER — Encounter (HOSPITAL_BASED_OUTPATIENT_CLINIC_OR_DEPARTMENT_OTHER): Payer: Self-pay | Admitting: Otolaryngology

## 2023-02-06 DIAGNOSIS — Z7901 Long term (current) use of anticoagulants: Secondary | ICD-10-CM | POA: Diagnosis not present

## 2023-02-06 DIAGNOSIS — R1314 Dysphagia, pharyngoesophageal phase: Secondary | ICD-10-CM | POA: Diagnosis not present

## 2023-02-06 DIAGNOSIS — E039 Hypothyroidism, unspecified: Secondary | ICD-10-CM | POA: Diagnosis not present

## 2023-02-06 DIAGNOSIS — E119 Type 2 diabetes mellitus without complications: Secondary | ICD-10-CM | POA: Diagnosis not present

## 2023-02-06 DIAGNOSIS — E1140 Type 2 diabetes mellitus with diabetic neuropathy, unspecified: Secondary | ICD-10-CM | POA: Insufficient documentation

## 2023-02-06 DIAGNOSIS — Z7985 Long-term (current) use of injectable non-insulin antidiabetic drugs: Secondary | ICD-10-CM | POA: Diagnosis not present

## 2023-02-06 DIAGNOSIS — Z794 Long term (current) use of insulin: Secondary | ICD-10-CM

## 2023-02-06 DIAGNOSIS — Z85810 Personal history of malignant neoplasm of tongue: Secondary | ICD-10-CM | POA: Insufficient documentation

## 2023-02-06 DIAGNOSIS — D638 Anemia in other chronic diseases classified elsewhere: Secondary | ICD-10-CM

## 2023-02-06 DIAGNOSIS — Z86711 Personal history of pulmonary embolism: Secondary | ICD-10-CM | POA: Insufficient documentation

## 2023-02-06 DIAGNOSIS — E785 Hyperlipidemia, unspecified: Secondary | ICD-10-CM | POA: Insufficient documentation

## 2023-02-06 DIAGNOSIS — K222 Esophageal obstruction: Secondary | ICD-10-CM | POA: Insufficient documentation

## 2023-02-06 DIAGNOSIS — Z86718 Personal history of other venous thrombosis and embolism: Secondary | ICD-10-CM | POA: Insufficient documentation

## 2023-02-06 DIAGNOSIS — I82409 Acute embolism and thrombosis of unspecified deep veins of unspecified lower extremity: Secondary | ICD-10-CM

## 2023-02-06 DIAGNOSIS — Z85528 Personal history of other malignant neoplasm of kidney: Secondary | ICD-10-CM | POA: Diagnosis not present

## 2023-02-06 DIAGNOSIS — Z9221 Personal history of antineoplastic chemotherapy: Secondary | ICD-10-CM | POA: Diagnosis not present

## 2023-02-06 DIAGNOSIS — M1990 Unspecified osteoarthritis, unspecified site: Secondary | ICD-10-CM | POA: Diagnosis not present

## 2023-02-06 HISTORY — PX: ESOPHAGOSCOPY WITH DILITATION: SHX5618

## 2023-02-06 LAB — GLUCOSE, CAPILLARY
Glucose-Capillary: 129 mg/dL — ABNORMAL HIGH (ref 70–99)
Glucose-Capillary: 142 mg/dL — ABNORMAL HIGH (ref 70–99)

## 2023-02-06 SURGERY — ESOPHAGOSCOPY, WITH DILATION
Anesthesia: General | Laterality: Bilateral

## 2023-02-06 MED ORDER — PHENYLEPHRINE 80 MCG/ML (10ML) SYRINGE FOR IV PUSH (FOR BLOOD PRESSURE SUPPORT)
PREFILLED_SYRINGE | INTRAVENOUS | Status: AC
  Filled 2023-02-06: qty 10

## 2023-02-06 MED ORDER — PROPOFOL 500 MG/50ML IV EMUL
INTRAVENOUS | Status: AC
  Filled 2023-02-06: qty 50

## 2023-02-06 MED ORDER — DEXAMETHASONE SODIUM PHOSPHATE 10 MG/ML IJ SOLN
INTRAMUSCULAR | Status: DC | PRN
  Administered 2023-02-06: 4 mg via INTRAVENOUS

## 2023-02-06 MED ORDER — PROPOFOL 10 MG/ML IV BOLUS
INTRAVENOUS | Status: DC | PRN
  Administered 2023-02-06: 150 mg via INTRAVENOUS

## 2023-02-06 MED ORDER — ONDANSETRON HCL 4 MG/2ML IJ SOLN
INTRAMUSCULAR | Status: AC
  Filled 2023-02-06: qty 2

## 2023-02-06 MED ORDER — ONDANSETRON HCL 4 MG/2ML IJ SOLN: 4.0000 mg | Freq: Once | INTRAMUSCULAR | Status: DC | PRN

## 2023-02-06 MED ORDER — SUCCINYLCHOLINE CHLORIDE 200 MG/10ML IV SOSY
PREFILLED_SYRINGE | INTRAVENOUS | Status: AC
  Filled 2023-02-06: qty 10

## 2023-02-06 MED ORDER — FENTANYL CITRATE (PF) 100 MCG/2ML IJ SOLN
INTRAMUSCULAR | Status: DC | PRN
  Administered 2023-02-06: 100 ug via INTRAVENOUS

## 2023-02-06 MED ORDER — OXYCODONE HCL 5 MG PO TABS: 5.0000 mg | ORAL_TABLET | Freq: Once | ORAL | Status: DC | PRN

## 2023-02-06 MED ORDER — LIDOCAINE 2% (20 MG/ML) 5 ML SYRINGE
INTRAMUSCULAR | Status: AC
  Filled 2023-02-06: qty 5

## 2023-02-06 MED ORDER — LIDOCAINE 2% (20 MG/ML) 5 ML SYRINGE
INTRAMUSCULAR | Status: DC | PRN
  Administered 2023-02-06: 80 mg via INTRAVENOUS

## 2023-02-06 MED ORDER — OXYCODONE HCL 5 MG/5ML PO SOLN: 5.0000 mg | Freq: Once | ORAL | Status: DC | PRN

## 2023-02-06 MED ORDER — DEXAMETHASONE SODIUM PHOSPHATE 10 MG/ML IJ SOLN
INTRAMUSCULAR | Status: AC
  Filled 2023-02-06: qty 1

## 2023-02-06 MED ORDER — LACTATED RINGERS IV SOLN: INTRAVENOUS | Status: DC

## 2023-02-06 MED ORDER — FENTANYL CITRATE (PF) 100 MCG/2ML IJ SOLN: 25.0000 ug | INTRAMUSCULAR | Status: DC | PRN

## 2023-02-06 MED ORDER — ROCURONIUM BROMIDE 10 MG/ML (PF) SYRINGE
PREFILLED_SYRINGE | INTRAVENOUS | Status: DC | PRN
  Administered 2023-02-06: 20 mg via INTRAVENOUS
  Administered 2023-02-06: 10 mg via INTRAVENOUS

## 2023-02-06 MED ORDER — SUCCINYLCHOLINE CHLORIDE 200 MG/10ML IV SOSY
PREFILLED_SYRINGE | INTRAVENOUS | Status: DC | PRN
  Administered 2023-02-06: 100 mg via INTRAVENOUS

## 2023-02-06 MED ORDER — LIDOCAINE 2% (20 MG/ML) 5 ML SYRINGE
INTRAMUSCULAR | Status: AC
  Filled 2023-02-06: qty 15

## 2023-02-06 MED ORDER — ONDANSETRON HCL 4 MG/2ML IJ SOLN
INTRAMUSCULAR | Status: DC | PRN
  Administered 2023-02-06: 4 mg via INTRAVENOUS

## 2023-02-06 MED ORDER — EPINEPHRINE HCL (NASAL) 0.1 % NA SOLN
NASAL | Status: AC
  Filled 2023-02-06: qty 30

## 2023-02-06 MED ORDER — OXYMETAZOLINE HCL 0.05 % NA SOLN
NASAL | Status: AC
  Filled 2023-02-06: qty 90

## 2023-02-06 MED ORDER — ROCURONIUM BROMIDE 10 MG/ML (PF) SYRINGE
PREFILLED_SYRINGE | INTRAVENOUS | Status: AC
  Filled 2023-02-06: qty 10

## 2023-02-06 MED ORDER — PHENYLEPHRINE 80 MCG/ML (10ML) SYRINGE FOR IV PUSH (FOR BLOOD PRESSURE SUPPORT)
PREFILLED_SYRINGE | INTRAVENOUS | Status: DC | PRN
  Administered 2023-02-06 (×2): 160 ug via INTRAVENOUS
  Administered 2023-02-06: 240 ug via INTRAVENOUS

## 2023-02-06 MED ORDER — FENTANYL CITRATE (PF) 100 MCG/2ML IJ SOLN
INTRAMUSCULAR | Status: AC
  Filled 2023-02-06: qty 2

## 2023-02-06 MED ORDER — SUGAMMADEX SODIUM 200 MG/2ML IV SOLN
INTRAVENOUS | Status: DC | PRN
  Administered 2023-02-06: 200 mg via INTRAVENOUS

## 2023-02-06 MED ORDER — LIDOCAINE-EPINEPHRINE 1 %-1:100000 IJ SOLN
INTRAMUSCULAR | Status: AC
  Filled 2023-02-06: qty 3

## 2023-02-06 SURGICAL SUPPLY — 24 items
CANISTER SUCT 1200ML W/VALVE (MISCELLANEOUS) ×1 IMPLANT
CNTNR URN SCR LID CUP LEK RST (MISCELLANEOUS) ×1 IMPLANT
CONT SPEC 4OZ STRL OR WHT (MISCELLANEOUS) ×1
DEFOGGER MIRROR 1QT (MISCELLANEOUS) IMPLANT
GAUZE SPONGE 4X4 12PLY STRL LF (GAUZE/BANDAGES/DRESSINGS) ×1 IMPLANT
GLOVE BIO SURGEON STRL SZ7.5 (GLOVE) ×1 IMPLANT
GOWN STRL REUS W/ TWL LRG LVL3 (GOWN DISPOSABLE) ×2 IMPLANT
GOWN STRL REUS W/TWL LRG LVL3 (GOWN DISPOSABLE) ×2
GUARD TEETH (MISCELLANEOUS) ×1 IMPLANT
MARKER SKIN DUAL TIP RULER LAB (MISCELLANEOUS) ×1 IMPLANT
NDL HYPO 18GX1.5 BLUNT FILL (NEEDLE) IMPLANT
NDL SPNL 22GX7 QUINCKE BK (NEEDLE) IMPLANT
NEEDLE HYPO 18GX1.5 BLUNT FILL (NEEDLE) IMPLANT
NEEDLE SPNL 22GX7 QUINCKE BK (NEEDLE) IMPLANT
NS IRRIG 1000ML POUR BTL (IV SOLUTION) ×1 IMPLANT
PACK BASIN DAY SURGERY FS (CUSTOM PROCEDURE TRAY) IMPLANT
PATTIES SURGICAL .5 X3 (DISPOSABLE) IMPLANT
SHEET MEDIUM DRAPE 40X70 STRL (DRAPES) ×1 IMPLANT
SOL ANTI FOG 6CC (MISCELLANEOUS) IMPLANT
SURGILUBE 2OZ TUBE FLIPTOP (MISCELLANEOUS) ×1 IMPLANT
SYR 5ML LL (SYRINGE) IMPLANT
SYR CONTROL 10ML LL (SYRINGE) IMPLANT
TOWEL GREEN STERILE FF (TOWEL DISPOSABLE) ×1 IMPLANT
TUBE CONNECTING 20X1/4 (TUBING) ×1 IMPLANT

## 2023-02-06 NOTE — H&P (Signed)
[name] is an 67 y.o. male.   Chief Complaint: Dysphagia HPI: 67 year old male with history of tongue bas cancer treated with chemoradiation who has had worsening symptoms of dysphagia.  Imaging suggests stricture.  Past Medical History:  Diagnosis Date   Alopecia    s/p chemotherapy   Arthritis    Bursitis    pt. denies   Cancer ([facility]) 10/2007    tongue/squamous cell,stg IV,HPV positive   Diabetes mellitus without complication ([facility])    takes Amaryl,Januvia,and Metformin,daily   DVT, lower extremity ([facility]) 2009   side effect from chemo and has blood clot left leg-wears compression stockings;was on Coumadin for 8months and has been off 5 yrs   History of chemotherapy    Cisplatin/Taxotere,5-FU   History of radiation therapy 01/28/08-03/11/08   left base tongue   History of shingles    Hyperlipidemia    takes Vytorin daily   Hypothyroidism    takes Synthroid daily   Joint pain    Joint swelling    Neuropathy    left foot   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 denies   Right kidney mass    Seborrheic keratosis    multiple on back    Past Surgical History:  Procedure Laterality Date   BIOPSY  07/07/2022   Procedure: BIOPSY;  Surgeon: Lynann Bologna, DO;  Location: [facility];  Service: Gastroenterology;;   CARDIAC CATHETERIZATION     CHEST TUBE INSERTION  07/06/2022   Procedure: CHEST TUBE INSERTION;  Surgeon: Leslye Peer, MD;  Location: [facility];  Service: Cardiopulmonary;;   ESOPHAGOGASTRODUODENOSCOPY (EGD) WITH PROPOFOL N/A 07/07/2022   Procedure: ESOPHAGOGASTRODUODENOSCOPY (EGD) WITH PROPOFOL;  Surgeon: Lynann Bologna, DO;  Location: [facility];  Service: Gastroenterology;  Laterality: N/A;   feeding tube placed  2009 and then removed   KNEE SURGERY Left 1993   LYMPH NODE DISSECTION Right 08/05/2020   Procedure: LYMPH NODE DISSECTION;  Surgeon: Heloise Purpura, MD;   Location: [facility];  Service: Urology;  Laterality: Right;   MOUTH SURGERY     PILONIDAL CYST / SINUS EXCISION  1974   port a cath placement     PORT-A-CATH REMOVAL  2010   right wrist fracture Right 2004   ROBOT ASSISTED LAPAROSCOPIC NEPHRECTOMY Right 08/05/2020   Procedure: XI ROBOTIC ASSISTED LAPAROSCOPIC RADICAL NEPHRECTOMY/PARTIAL ADRENALECTOMY;  Surgeon: Heloise Purpura, MD;  Location: [facility];  Service: Urology;  Laterality: Right;   SHOULDER ARTHROSCOPY WITH DISTAL CLAVICLE RESECTION Right 10/26/2016   Procedure: SHOULDER ARTHROSCOPY WITH DISTAL CLAVICLE RESECTION;  Surgeon: Francena Hanly, MD;  Location: [facility];  Service: Orthopedics;  Laterality: Right;   SHOULDER ARTHROSCOPY WITH ROTATOR CUFF REPAIR AND SUBACROMIAL DECOMPRESSION Left 07/16/2014   Procedure: LEFT SHOULDER ARTHROSCOPY WITH ROTATOR CUFF REPAIR AND SUBACROMIAL DECOMPRESSION/DISTAL CLAVICLE RESECTION;  Surgeon: Senaida Lange, MD;  Location: [facility];  Service: Orthopedics;  Laterality: Left;   SHOULDER ARTHROSCOPY WITH ROTATOR CUFF REPAIR AND SUBACROMIAL DECOMPRESSION Right 10/26/2016   Procedure: RIGHT SHOULDER ARTHROSCOPY WITH ROTATOR CUFF REPAIR AND SUBACROMIAL DECOMPRESSION;  Surgeon: Francena Hanly, MD;  Location: [facility];  Service: Orthopedics;  Laterality: Right;  requests 2hrs   teeth extraction #9      Family History  Problem Relation Age of Onset   Cancer Mother        uterine   Diabetes Brother    Social History:  reports that he has  never smoked. He has been exposed to tobacco smoke. He has never used smokeless tobacco. He reports current alcohol use. He reports that he does not use drugs.  Allergies: No Known Allergies  Medications Prior to Admission  Medication Sig Dispense Refill   Dulaglutide (TRULICITY) 1.5 MG/0.5ML SOPN Inject 1 pen under the skin once a week (Patient taking differently: Inject 1.5 mg into the skin every Sunday.) 6 mL 4   ezetimibe-simvastatin (VYTORIN) 10-20 MG tablet Take 1 tablet by mouth  once daily at night. 90 tablet 4   insulin glargine (LANTUS) 100 UNIT/ML injection Inject 24 Units into the skin 2 (two) times daily.     levothyroxine (SYNTHROID) 75 MCG tablet Take 1 tablet by mouth every morning on an empty stomach (Patient taking differently: Take 75 mcg by mouth daily before breakfast.) 90 tablet 3   losartan (COZAAR) 25 MG tablet Take 25 mg by mouth daily.     Multiple Vitamin (MULTIVITAMIN) tablet Take 1 tablet by mouth daily.     rivaroxaban (XARELTO) 10 MG TABS tablet TAKE 1 TABLET BY MOUTH ONCE DAILY WITH FOOD. (Patient taking differently: Take 10 mg by mouth in the morning.) 90 tablet 4   glucose blood test strip Use to check blood sugar twice daily 100 each 4   Insulin Pen Needle (PIP PEN NEEDLES 31G X ) 31G X 5 MM MISC Use as directed once daily 100 each 3   Lancets (FREESTYLE) lancets Use to check blood sugar twice a day. 100 each 4    Results for orders placed or performed during the hospital encounter of 02/06/23 (from the past 48 hour(s))  Glucose, capillary     Status: Abnormal   Collection Time: 02/06/23  9:12 AM  Result Value Ref Range   Glucose-Capillary 129 (H) 70 - 99 mg/dL    Comment: Glucose reference range applies only to samples taken after fasting for at least 8 hours.   Comment 1 Notify RN    Comment 2 Document in Chart    No results found.  Review of Systems  All other systems reviewed and are negative.   Blood pressure (!) 171/86, pulse 86, temperature (!) 97.2 F (36.2 C), temperature source Oral, resp. rate 15, height 6\' 2"  (1.88 m), weight 86 kg, SpO2 100 %. Physical Exam Constitutional:      Appearance: Normal appearance. He is normal weight.  HENT:     Head: Normocephalic and atraumatic.     Right Ear: External ear normal.     Left Ear: External ear normal.     Nose: Nose normal.     Mouth/Throat:     Mouth: Mucous membranes are moist.     Pharynx: Oropharynx is clear.  Eyes:     Extraocular Movements: Extraocular  movements intact.     Conjunctiva/sclera: Conjunctivae normal.     Pupils: Pupils are equal, round, and reactive to light.  Cardiovascular:     Rate and Rhythm: Normal rate.  Pulmonary:     Effort: Pulmonary effort is normal.  Musculoskeletal:     Cervical back: Normal range of motion.  Skin:    General: Skin is warm and dry.  Neurological:     General: No focal deficit present.     Mental Status: He is alert and oriented to person, place, and time.  Psychiatric:        Mood and Affect: Mood normal.        Behavior: Behavior normal.  Thought Content: Thought content normal.        Judgment: Judgment normal.      Assessment/Plan Dysphagia, esophageal stricture  To OR for esophagoscopy with dilation.  Christia Reading, MD 02/06/2023, 9:36 AM

## 2023-02-06 NOTE — Transfer of Care (Signed)
Immediate Anesthesia Transfer of Care Note  Patient: [name]  Procedure(s) Performed: RIGID ESOPHAGOSCOPY WITH DILITATION (Bilateral)  Patient Location: PACU  Anesthesia Type:General  Level of Consciousness: awake, alert , and oriented  Airway & Oxygen Therapy: Patient Spontanous Breathing and Patient connected to face mask oxygen  Post-op Assessment: Report given to RN and Post -op Vital signs reviewed and stable  Post vital signs: Reviewed and stable  Last Vitals:  Vitals Value Taken Time  BP 129/77 02/06/23 1023  Temp    Pulse 71 02/06/23 1025  Resp 10 02/06/23 1025  SpO2 100 % 02/06/23 1025  Vitals shown include unvalidated device data.  Last Pain:  Vitals:   02/06/23 0903  TempSrc: Oral  PainSc: 0-No pain         Complications:  Encounter Notable Events  Notable Event Outcome Phase Comment  Difficult to intubate - expected  Intraprocedure Filed from anesthesia note documentation.

## 2023-02-06 NOTE — Anesthesia Postprocedure Evaluation (Signed)
Anesthesia Post Note  Patient: [name]  Procedure(s) Performed: RIGID ESOPHAGOSCOPY WITH DILITATION (Bilateral)     Patient location during evaluation: PACU Anesthesia Type: General Level of consciousness: awake and alert and oriented Pain management: pain level controlled Vital Signs Assessment: post-procedure vital signs reviewed and stable Respiratory status: spontaneous breathing, nonlabored ventilation and respiratory function stable Cardiovascular status: blood pressure returned to baseline and stable Postop Assessment: no apparent nausea or vomiting Anesthetic complications: yes   Encounter Notable Events  Notable Event Outcome Phase Comment  Difficult to intubate - expected  Intraprocedure Filed from anesthesia note documentation.    Last Vitals:  Vitals:   02/06/23 1030 02/06/23 1031  BP: 120/76   Pulse: 70 70  Resp: (!) 8 (!) 9  Temp:    SpO2: 100% 99%    Last Pain:  Vitals:   02/06/23 1028  TempSrc:   PainSc: 0-No pain                 Pistol Kessenich A.

## 2023-02-06 NOTE — Anesthesia Procedure Notes (Signed)
Procedure Name: Intubation Date/Time: 02/06/2023 9:49 AM  Performed by: Pearson Grippe, CRNAPre-anesthesia Checklist: Patient identified, Emergency Drugs available, Suction available and Patient being monitored Patient Re-evaluated:Patient Re-evaluated prior to induction Oxygen Delivery Method: Circle system utilized Preoxygenation: Pre-oxygenation with 100% oxygen Induction Type: IV induction Ventilation: Mask ventilation without difficulty Laryngoscope Size: Glidescope and 4 Grade View: Grade I Tube type: Oral Tube size: 7.5 mm Number of attempts: 1 Airway Equipment and Method: Stylet and Video-laryngoscopy Placement Confirmation: ETT inserted through vocal cords under direct vision, positive ETCO2 and breath sounds checked- equal and bilateral Secured at: 22 cm Tube secured with: Tape Dental Injury: Teeth and Oropharynx as per pre-operative assessment  Difficulty Due To: Difficulty was anticipated and Difficult Airway- due to reduced neck mobility Comments: Elective glidescope d/t hx prior difficult airway/radiation to neck with reduced mobility

## 2023-02-06 NOTE — Brief Op Note (Signed)
02/06/2023  10:10 AM  PATIENT:  [name]  67 y.o. male  PRE-OPERATIVE DIAGNOSIS:  Pharyngoesophageal dysphagia History of primary malignant neoplasm of base of tongue  POST-OPERATIVE DIAGNOSIS:  Pharyngoesophageal dysphagiaHistory of primary malignant neoplasm of base of tongue  PROCEDURE:  Procedure(s): RIGID ESOPHAGOSCOPY WITH DILITATION (Bilateral)  SURGEON:  Surgeon(s) and Role:    Christia Reading, MD - Primary  PHYSICIAN ASSISTANT:   ASSISTANTS: none   ANESTHESIA:   general  EBL:  None   BLOOD ADMINISTERED:none  DRAINS: none   LOCAL MEDICATIONS USED:  NONE  SPECIMEN:  No Specimen  DISPOSITION OF SPECIMEN:  N/A  COUNTS:  YES  TOURNIQUET:  * No tourniquets in log *  DICTATION: .Note written in EPIC  PLAN OF CARE: Discharge to home after PACU  PATIENT DISPOSITION:  PACU - hemodynamically stable.   Delay start of Pharmacological VTE agent (>24hrs) due to surgical blood loss or risk of bleeding: no

## 2023-02-06 NOTE — Op Note (Signed)
Preop diagnosis: History of tongue base cancer, pharyngoesophageal dysphagia, and esophageal stricture Postop diagnosis: same Procedure: Rigid esophagoscopy with dilation Surgeon: Jenne Pane Anesth: General Compl: None Findings: Narrow area about 16 cm from teeth.  Dilated from 38 Fr to 52 Fr. Description:  After discussing risks, benefits, and alternatives, the patient was brought to the operating room and placed on the operative table in the supine position.  Anesthesia was induced and the patient was intubated by the anesthesia team without difficulty.  The eyes were taped closed and the bed was turned 90 degrees from anesthesia.  A tooth guard was placed over the upper teeth and a Pilling rigid cervical esophagoscope was inserted and passed down into the esophagus keeping the lumen in view.  It could not readily be passed beyond a narrow area about 16 cm from the teeth.  The cervical esophagoscope was removed.  A standard rigid esophagoscope was then passed and was able to be passed through the narrow area.  It was passed into the mid esophagus and then slowly backed out.  Maloney dilators were then serially passed starting with a 38 Fr dilator and dilating to a 52 Fr dilator.  The dilators passed relatively easily with no bleeding.  The cervical esophagoscope was then reinserted and was able to be passed through the narrow area with ease.  The esophagoscope was removed along with the tooth guard.  The patient was turned back to anesthesia for wake up and was extubated and moved to the recovery room in stable condition.

## 2023-02-06 NOTE — Discharge Instructions (Signed)

## 2023-02-07 ENCOUNTER — Encounter (HOSPITAL_BASED_OUTPATIENT_CLINIC_OR_DEPARTMENT_OTHER): Payer: Self-pay | Admitting: Otolaryngology

## 2023-02-13 DIAGNOSIS — Z0100 Encounter for examination of eyes and vision without abnormal findings: Secondary | ICD-10-CM | POA: Diagnosis not present

## 2023-02-13 DIAGNOSIS — E119 Type 2 diabetes mellitus without complications: Secondary | ICD-10-CM | POA: Diagnosis not present

## 2023-02-13 DIAGNOSIS — H40013 Open angle with borderline findings, low risk, bilateral: Secondary | ICD-10-CM | POA: Diagnosis not present

## 2023-02-13 DIAGNOSIS — H2513 Age-related nuclear cataract, bilateral: Secondary | ICD-10-CM | POA: Diagnosis not present

## 2023-02-14 DIAGNOSIS — E032 Hypothyroidism due to medicaments and other exogenous substances: Secondary | ICD-10-CM | POA: Diagnosis not present

## 2023-02-14 DIAGNOSIS — Z86711 Personal history of pulmonary embolism: Secondary | ICD-10-CM | POA: Diagnosis not present

## 2023-02-14 DIAGNOSIS — E1122 Type 2 diabetes mellitus with diabetic chronic kidney disease: Secondary | ICD-10-CM | POA: Diagnosis not present

## 2023-02-14 DIAGNOSIS — N183 Chronic kidney disease, stage 3 unspecified: Secondary | ICD-10-CM | POA: Diagnosis not present

## 2023-02-14 DIAGNOSIS — Z0000 Encounter for general adult medical examination without abnormal findings: Secondary | ICD-10-CM | POA: Diagnosis not present

## 2023-02-14 DIAGNOSIS — E7800 Pure hypercholesterolemia, unspecified: Secondary | ICD-10-CM | POA: Diagnosis not present

## 2023-02-14 DIAGNOSIS — Z794 Long term (current) use of insulin: Secondary | ICD-10-CM | POA: Diagnosis not present

## 2023-02-14 DIAGNOSIS — I10 Essential (primary) hypertension: Secondary | ICD-10-CM | POA: Diagnosis not present

## 2023-02-14 DIAGNOSIS — Z6824 Body mass index (BMI) 24.0-24.9, adult: Secondary | ICD-10-CM | POA: Diagnosis not present

## 2023-02-21 DIAGNOSIS — R1314 Dysphagia, pharyngoesophageal phase: Secondary | ICD-10-CM | POA: Diagnosis not present

## 2023-02-21 DIAGNOSIS — K222 Esophageal obstruction: Secondary | ICD-10-CM | POA: Diagnosis not present

## 2023-04-03 DIAGNOSIS — H40013 Open angle with borderline findings, low risk, bilateral: Secondary | ICD-10-CM | POA: Diagnosis not present

## 2023-04-19 DIAGNOSIS — K640 First degree hemorrhoids: Secondary | ICD-10-CM | POA: Diagnosis not present

## 2023-04-19 DIAGNOSIS — Z86010 Personal history of colonic polyps: Secondary | ICD-10-CM | POA: Diagnosis not present

## 2023-04-19 DIAGNOSIS — Z09 Encounter for follow-up examination after completed treatment for conditions other than malignant neoplasm: Secondary | ICD-10-CM | POA: Diagnosis not present

## 2023-04-27 DIAGNOSIS — U071 COVID-19: Secondary | ICD-10-CM | POA: Diagnosis not present

## 2023-04-28 ENCOUNTER — Other Ambulatory Visit (HOSPITAL_COMMUNITY): Payer: Self-pay

## 2023-05-30 DIAGNOSIS — R809 Proteinuria, unspecified: Secondary | ICD-10-CM | POA: Diagnosis not present

## 2023-05-30 DIAGNOSIS — Z85528 Personal history of other malignant neoplasm of kidney: Secondary | ICD-10-CM | POA: Diagnosis not present

## 2023-05-30 DIAGNOSIS — N2581 Secondary hyperparathyroidism of renal origin: Secondary | ICD-10-CM | POA: Diagnosis not present

## 2023-05-30 DIAGNOSIS — R1310 Dysphagia, unspecified: Secondary | ICD-10-CM | POA: Diagnosis not present

## 2023-05-30 DIAGNOSIS — D631 Anemia in chronic kidney disease: Secondary | ICD-10-CM | POA: Diagnosis not present

## 2023-05-30 DIAGNOSIS — N183 Chronic kidney disease, stage 3 unspecified: Secondary | ICD-10-CM | POA: Diagnosis not present

## 2023-05-30 DIAGNOSIS — I129 Hypertensive chronic kidney disease with stage 1 through stage 4 chronic kidney disease, or unspecified chronic kidney disease: Secondary | ICD-10-CM | POA: Diagnosis not present

## 2023-06-20 DIAGNOSIS — N183 Chronic kidney disease, stage 3 unspecified: Secondary | ICD-10-CM | POA: Diagnosis not present

## 2023-07-10 DIAGNOSIS — Z23 Encounter for immunization: Secondary | ICD-10-CM | POA: Diagnosis not present

## 2023-07-10 DIAGNOSIS — N183 Chronic kidney disease, stage 3 unspecified: Secondary | ICD-10-CM | POA: Diagnosis not present

## 2023-08-10 DIAGNOSIS — J90 Pleural effusion, not elsewhere classified: Secondary | ICD-10-CM | POA: Diagnosis not present

## 2023-08-10 DIAGNOSIS — C641 Malignant neoplasm of right kidney, except renal pelvis: Secondary | ICD-10-CM | POA: Diagnosis not present

## 2023-08-10 DIAGNOSIS — Z905 Acquired absence of kidney: Secondary | ICD-10-CM | POA: Diagnosis not present

## 2023-08-10 DIAGNOSIS — J9811 Atelectasis: Secondary | ICD-10-CM | POA: Diagnosis not present

## 2023-08-10 DIAGNOSIS — K760 Fatty (change of) liver, not elsewhere classified: Secondary | ICD-10-CM | POA: Diagnosis not present

## 2023-08-16 DIAGNOSIS — E1122 Type 2 diabetes mellitus with diabetic chronic kidney disease: Secondary | ICD-10-CM | POA: Diagnosis not present

## 2023-08-16 DIAGNOSIS — E032 Hypothyroidism due to medicaments and other exogenous substances: Secondary | ICD-10-CM | POA: Diagnosis not present

## 2023-08-16 DIAGNOSIS — I10 Essential (primary) hypertension: Secondary | ICD-10-CM | POA: Diagnosis not present

## 2023-08-16 DIAGNOSIS — I700 Atherosclerosis of aorta: Secondary | ICD-10-CM | POA: Diagnosis not present

## 2023-08-16 DIAGNOSIS — R011 Cardiac murmur, unspecified: Secondary | ICD-10-CM | POA: Diagnosis not present

## 2023-08-16 DIAGNOSIS — E7800 Pure hypercholesterolemia, unspecified: Secondary | ICD-10-CM | POA: Diagnosis not present

## 2023-09-12 DIAGNOSIS — Z85810 Personal history of malignant neoplasm of tongue: Secondary | ICD-10-CM | POA: Diagnosis not present

## 2023-09-12 DIAGNOSIS — K222 Esophageal obstruction: Secondary | ICD-10-CM | POA: Diagnosis not present

## 2023-09-12 DIAGNOSIS — J3801 Paralysis of vocal cords and larynx, unilateral: Secondary | ICD-10-CM | POA: Diagnosis not present

## 2023-09-27 ENCOUNTER — Other Ambulatory Visit: Payer: Self-pay | Admitting: Otolaryngology

## 2023-09-28 ENCOUNTER — Other Ambulatory Visit: Payer: Self-pay | Admitting: Otolaryngology

## 2023-10-02 ENCOUNTER — Encounter (HOSPITAL_COMMUNITY): Payer: Self-pay | Admitting: Otolaryngology

## 2023-10-02 ENCOUNTER — Other Ambulatory Visit: Payer: Self-pay

## 2023-10-02 NOTE — Progress Notes (Signed)
PCP - Dr. Gildardo Cranker Cardiologist - denies  PPM/ICD - denies   Chest x-ray - 08/08/22 EKG - DOS Stress Test - denies ECHO - 12/08/14 Cardiac Cath - 04/06/03  CPAP - denies  Fasting Blood Sugar - 120-130 Checks Blood Sugar 1-2 times/day  Pt states he was not told to hold his Trulicity and took his last dose on 1/26.  Blood Thinner Instructions: Pt said he was told he did not need to hold his Xarelto. I called [name] to verify this, awaiting a call back Aspirin Instructions: n/a  ERAS Protcol - clears until 0430  COVID TEST- n/a  Anesthesia review: yes, difficult airway, xarelto not held (last dose 1/28 AM), trulicity not held (last dose 1/26)  Patient verbally denies any shortness of breath, fever, cough and chest pain during phone call      Questions were answered. Patient verbalized understanding of instructions.

## 2023-10-02 NOTE — Pre-Procedure Instructions (Addendum)
-------------  SDW INSTRUCTIONS given:  Your procedure is scheduled on 1/30.  Report to [facility] Main Entrance "A" at 05:30 A.M., and check in at the Admitting office.  Any questions or running late day of surgery: call [phone]    Remember:  Do not eat after midnight the night before your surgery  You may drink clear liquids until 04:30 AM the morning of your surgery.   Clear liquids allowed are: Water, Non-Citrus Juices (without pulp), Carbonated Beverages, Clear Tea, Black Coffee Only, and Gatorade    Take these medicines the morning of surgery with A SIP OF WATER  vytorin, synthroid   Follow your surgeon's instructions on when to stop Xarelto.  If no instructions were given by your surgeon then you will need to call the office to get those instructions.    As of today, STOP taking any Aspirin (unless otherwise instructed by your surgeon) Aleve, Naproxen, Ibuprofen, Motrin, Advil, Goody's, BC's, all herbal medications, fish oil, and all vitamins.  WHAT DO I DO ABOUT MY DIABETES MEDICATION?  THE NIGHT BEFORE SURGERY, take 5 units of BASAGLAR     THE MORNING OF SURGERY, take 10 units of BASAGLAR  The day of surgery, do not take other diabetes injectables, including Byetta (exenatide), Bydureon (exenatide ER), Victoza (liraglutide), or Trulicity (dulaglutide).    HOW TO MANAGE YOUR DIABETES BEFORE AND AFTER SURGERY  Why is it important to control my blood sugar before and after surgery? Improving blood sugar levels before and after surgery helps healing and can limit problems. A way of improving blood sugar control is eating a healthy diet by:  Eating less sugar and carbohydrates  Increasing activity/exercise  Talking with your doctor about reaching your blood sugar goals High blood sugars (greater than 180 mg/dL) can raise your risk of infections and slow your recovery, so you will need to focus on controlling your diabetes during the weeks before surgery. Make sure  that the doctor who takes care of your diabetes knows about your planned surgery including the date and location.  How do I manage my blood sugar before surgery? Check your blood sugar at least 4 times a day, starting 2 days before surgery, to make sure that the level is not too high or low.  Check your blood sugar the morning of your surgery when you wake up and every 2 hours until you get to the Short Stay unit.  If your blood sugar is less than 70 mg/dL, you will need to treat for low blood sugar: Do not take insulin. Treat a low blood sugar (less than 70 mg/dL) with  cup of clear juice (cranberry or apple), 4 glucose tablets, OR glucose gel. Recheck blood sugar in 15 minutes after treatment (to make sure it is greater than 70 mg/dL). If your blood sugar is not greater than 70 mg/dL on recheck, call [phone] for further instructions. Report your blood sugar to the short stay nurse when you get to Short Stay.  If you are admitted to the hospital after surgery: Your blood sugar will be checked by the staff and you will probably be given insulin after surgery (instead of oral diabetes medicines) to make sure you have good blood sugar levels. The goal for blood sugar control after surgery is 80-180 mg/dL.   Do NOT Smoke (Tobacco/Vaping) 24 hours prior to your procedure  If you use a CPAP at night, you may bring all equipment for your overnight stay.     You will be  asked to remove any contacts, glasses, piercing's, hearing aid's, dentures/partials prior to surgery. Please bring cases for these items if needed.     Patients discharged the day of surgery will not be allowed to drive home, and someone needs to stay with them for 24 hours.  SURGICAL WAITING ROOM VISITATION Patients may have no more than 2 support people in the waiting area - these visitors may rotate.   Pre-op nurse will coordinate an appropriate time for 1 ADULT support person, who may not rotate, to accompany patient  in pre-op.  Children under the age of 41 must have an adult with them who is not the patient and must remain in the main waiting area with an adult.  If the patient needs to stay at the hospital during part of their recovery, the visitor guidelines for inpatient rooms apply.  Please refer to the [facility] website for the visitor guidelines for any additional information.   Special instructions:   [facility]- Preparing For Surgery   Please follow these instructions carefully.   Shower the NIGHT BEFORE SURGERY and the MORNING OF SURGERY with DIAL Soap.   Pat yourself dry with a CLEAN TOWEL.  Wear CLEAN PAJAMAS to bed the night before surgery  Place CLEAN SHEETS on your bed the night of your first shower and DO NOT SLEEP WITH PETS.   Additional instructions for the day of surgery: DO NOT APPLY any lotions, deodorants, cologne, or perfumes.   Do not wear jewelry or makeup Do not wear nail polish, gel polish, artificial nails, or any other type of covering on natural nails (fingers and toes) Do not bring valuables to the hospital. [facility] is not responsible for valuables/personal belongings. Put on clean/comfortable clothes.  Please brush your teeth.  Ask your nurse before applying any prescription medications to the skin.

## 2023-10-03 NOTE — Anesthesia Preprocedure Evaluation (Signed)
Anesthesia Evaluation  Patient identified by MRN, date of birth, ID band Patient awake  General Assessment Comment:Pharyngoesophageal dysphagia; Dysphonia; Paresis of left vocal cord; History of primary malignant neoplasm of base of tongue  Reviewed: Allergy & Precautions, NPO status , Patient's Chart, lab work & pertinent test results  Airway Mallampati: II  TM Distance: >3 FB Neck ROM: Limited    Dental  (+) Dental Advisory Given, Chipped, Missing,    Pulmonary neg pulmonary ROS   Pulmonary exam normal breath sounds clear to auscultation       Cardiovascular hypertension, Pt. on medications Normal cardiovascular exam Rhythm:Regular Rate:Normal     Neuro/Psych  PSYCHIATRIC DISORDERS  Depression     Neuromuscular disease    GI/Hepatic   Endo/Other  diabetes, Type 2, Insulin Dependent, Oral Hypoglycemic AgentsHypothyroidism    Renal/GU Renal InsufficiencyRenal diseaserenal cell cancer s/p right radical nephrectomy 08/2020     Musculoskeletal  (+) Arthritis ,    Abdominal   Peds  Hematology  (+) Blood dyscrasia (Xarelto), anemia   Anesthesia Other Findings Osteoradionecrosis of mandible  Reproductive/Obstetrics                             Anesthesia Physical Anesthesia Plan  ASA: 4  Anesthesia Plan: General   Post-op Pain Management: Tylenol PO (pre-op)*   Induction: Intravenous  PONV Risk Score and Plan: 2 and Dexamethasone, Ondansetron and TIVA  Airway Management Planned: Oral ETT and Video Laryngoscope Planned  Additional Equipment:   Intra-op Plan:   Post-operative Plan: Extubation in OR  Informed Consent: I have reviewed the patients History and Physical, chart, labs and discussed the procedure including the risks, benefits and alternatives for the proposed anesthesia with the patient or authorized representative who has indicated his/her understanding and acceptance.      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  Last seen by Dr. Jenne Pane 09/12/2023 with concern of difficulty speaking due to vocal cord issues.  In office laryngoscopy showed, "unremarkable pharynx.  Left vocal fold with very little movement, paramedian position.  Small glottal gap with phonation."  History of difficult intubation secondary to reduced neck mobility from prior radiation treatments.  GlideScope was used electively 02/06/2023.  Patient reports last dose GLP-1 agonist Trulicity on 09/30/2023.  Patient will need day of surgery labs and evaluation.  EKG 07/05/2022: Sinus tachycardia rate 114. Abnormal R-wave progression., early transition  TTE 12/08/2014: Left ventricle: The cavity size was normal. Systolic function was  normal. The estimated ejection fraction was in the range of 55%  to 60%. Wall motion was normal; there were no regional wall  motion abnormalities. There was an increased relative  contribution of atrial contraction to ventricular filling.  Doppler parameters are consistent with abnormal left ventricular  relaxation (grade 1 diastolic dysfunction).  - Aortic valve: Trileaflet; normal thickness, mildly calcified  leaflets.  - Right ventricle: Poorly visualized.   )        Anesthesia Quick Evaluation

## 2023-10-03 NOTE — Progress Notes (Signed)
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  Last seen by Dr. Jenne Pane 09/12/2023 with concern of difficulty speaking due to vocal cord issues.  In office laryngoscopy showed, "unremarkable pharynx.  Left vocal fold with very little movement, paramedian position.  Small glottal gap with phonation."  History of difficult intubation secondary to reduced neck mobility from prior radiation treatments.  GlideScope was used electively 02/06/2023.  Patient reports last dose GLP-1 agonist Trulicity on 09/30/2023.  Patient will need day of surgery labs and evaluation.  EKG 07/05/2022: Sinus tachycardia rate 114. Abnormal R-wave progression., early transition  TTE 12/08/2014: Left ventricle: The cavity size was normal. Systolic function was    normal. The estimated ejection fraction was in the range of 55%    to 60%. Wall motion was normal; there were no regional wall    motion abnormalities. There was an increased relative    contribution of atrial contraction to ventricular filling.    Doppler parameters are consistent with abnormal left ventricular    relaxation (grade 1 diastolic dysfunction).  - Aortic valve: Trileaflet; normal thickness, mildly calcified    leaflets.  - Right ventricle: Poorly visualized.      [facility] Short Stay Center/Anesthesiology Phone [phone] 10/03/2023 11:03 AM

## 2023-10-04 ENCOUNTER — Encounter (HOSPITAL_COMMUNITY)
Admission: RE | Disposition: A | Discharge status: Home / Self Care | Payer: Self-pay | Source: Ambulatory Visit | Attending: Otolaryngology

## 2023-10-04 ENCOUNTER — Encounter (HOSPITAL_COMMUNITY): Payer: Self-pay | Admitting: Otolaryngology

## 2023-10-04 ENCOUNTER — Ambulatory Visit (HOSPITAL_BASED_OUTPATIENT_CLINIC_OR_DEPARTMENT_OTHER): Payer: Medicare HMO | Admitting: Physician Assistant

## 2023-10-04 ENCOUNTER — Ambulatory Visit (HOSPITAL_COMMUNITY): Payer: Self-pay | Admitting: Physician Assistant

## 2023-10-04 ENCOUNTER — Ambulatory Visit (HOSPITAL_COMMUNITY)
Admission: RE | Admit: 2023-10-04 | Discharge: 2023-10-04 | Disposition: A | Discharge status: Home / Self Care | Payer: Medicare HMO | Source: Ambulatory Visit | Attending: Otolaryngology | Admitting: Otolaryngology

## 2023-10-04 DIAGNOSIS — N183 Chronic kidney disease, stage 3 unspecified: Secondary | ICD-10-CM | POA: Diagnosis not present

## 2023-10-04 DIAGNOSIS — E119 Type 2 diabetes mellitus without complications: Secondary | ICD-10-CM | POA: Diagnosis not present

## 2023-10-04 DIAGNOSIS — R490 Dysphonia: Secondary | ICD-10-CM | POA: Diagnosis present

## 2023-10-04 DIAGNOSIS — I10 Essential (primary) hypertension: Secondary | ICD-10-CM

## 2023-10-04 DIAGNOSIS — Z905 Acquired absence of kidney: Secondary | ICD-10-CM | POA: Insufficient documentation

## 2023-10-04 DIAGNOSIS — I129 Hypertensive chronic kidney disease with stage 1 through stage 4 chronic kidney disease, or unspecified chronic kidney disease: Secondary | ICD-10-CM | POA: Diagnosis not present

## 2023-10-04 DIAGNOSIS — D631 Anemia in chronic kidney disease: Secondary | ICD-10-CM | POA: Diagnosis not present

## 2023-10-04 DIAGNOSIS — Z86718 Personal history of other venous thrombosis and embolism: Secondary | ICD-10-CM | POA: Insufficient documentation

## 2023-10-04 DIAGNOSIS — Z7985 Long-term (current) use of injectable non-insulin antidiabetic drugs: Secondary | ICD-10-CM | POA: Insufficient documentation

## 2023-10-04 DIAGNOSIS — Z85810 Personal history of malignant neoplasm of tongue: Secondary | ICD-10-CM | POA: Insufficient documentation

## 2023-10-04 DIAGNOSIS — R1314 Dysphagia, pharyngoesophageal phase: Secondary | ICD-10-CM | POA: Diagnosis not present

## 2023-10-04 DIAGNOSIS — E1122 Type 2 diabetes mellitus with diabetic chronic kidney disease: Secondary | ICD-10-CM | POA: Insufficient documentation

## 2023-10-04 DIAGNOSIS — Z86711 Personal history of pulmonary embolism: Secondary | ICD-10-CM | POA: Diagnosis not present

## 2023-10-04 DIAGNOSIS — J3801 Paralysis of vocal cords and larynx, unilateral: Secondary | ICD-10-CM | POA: Diagnosis not present

## 2023-10-04 DIAGNOSIS — Z923 Personal history of irradiation: Secondary | ICD-10-CM | POA: Diagnosis not present

## 2023-10-04 DIAGNOSIS — Z7901 Long term (current) use of anticoagulants: Secondary | ICD-10-CM | POA: Diagnosis not present

## 2023-10-04 DIAGNOSIS — E039 Hypothyroidism, unspecified: Secondary | ICD-10-CM | POA: Diagnosis not present

## 2023-10-04 DIAGNOSIS — Z9221 Personal history of antineoplastic chemotherapy: Secondary | ICD-10-CM | POA: Insufficient documentation

## 2023-10-04 DIAGNOSIS — Z794 Long term (current) use of insulin: Secondary | ICD-10-CM | POA: Insufficient documentation

## 2023-10-04 HISTORY — PX: MICROLARYNGOSCOPY W/VOCAL CORD INJECTION: SHX2665

## 2023-10-04 HISTORY — DX: Essential (primary) hypertension: I10

## 2023-10-04 LAB — BASIC METABOLIC PANEL
Anion gap: 6 (ref 5–15)
BUN: 38 mg/dL — ABNORMAL HIGH (ref 8–23)
CO2: 24 mmol/L (ref 22–32)
Calcium: 8.9 mg/dL (ref 8.9–10.3)
Chloride: 107 mmol/L (ref 98–111)
Creatinine, Ser: 2.34 mg/dL — ABNORMAL HIGH (ref 0.61–1.24)
GFR, Estimated: 29 mL/min — ABNORMAL LOW (ref >60)
Glucose, Bld: 189 mg/dL — ABNORMAL HIGH (ref 70–99)
Potassium: 5.1 mmol/L (ref 3.5–5.1)
Sodium: 137 mmol/L (ref 135–145)

## 2023-10-04 LAB — GLUCOSE, CAPILLARY
Glucose-Capillary: 200 mg/dL — ABNORMAL HIGH (ref 70–99)
Glucose-Capillary: 158 mg/dL — ABNORMAL HIGH (ref 70–99)

## 2023-10-04 LAB — CBC
HCT: 39.4 % (ref 39.0–52.0)
Hemoglobin: 12.5 g/dL — ABNORMAL LOW (ref 13.0–17.0)
MCH: 28.5 pg (ref 26.0–34.0)
MCHC: 31.7 g/dL (ref 30.0–36.0)
MCV: 90 fL (ref 80.0–100.0)
Platelets: 178 10*3/uL (ref 150–400)
RBC: 4.38 MIL/uL (ref 4.22–5.81)
RDW: 12.8 % (ref 11.5–15.5)
WBC: 8.1 10*3/uL (ref 4.0–10.5)
nRBC: 0 % (ref 0.0–0.2)

## 2023-10-04 SURGERY — MICROLARYNGOSCOPY, WITH VOCAL CORD INJECTION
Anesthesia: General

## 2023-10-04 MED ORDER — DEXAMETHASONE SODIUM PHOSPHATE 10 MG/ML IJ SOLN
INTRAMUSCULAR | Status: AC
  Filled 2023-10-04: qty 1

## 2023-10-04 MED ORDER — ORAL CARE MOUTH RINSE: 15.0000 mL | Freq: Once | OROMUCOSAL | Status: AC

## 2023-10-04 MED ORDER — EPINEPHRINE HCL (NASAL) 0.1 % NA SOLN
NASAL | Status: DC | PRN
  Administered 2023-10-04: 20 mL via TOPICAL

## 2023-10-04 MED ORDER — SODIUM CHLORIDE 0.9 % IV SOLN: INTRAVENOUS | Status: DC

## 2023-10-04 MED ORDER — PROPOFOL 10 MG/ML IV BOLUS
INTRAVENOUS | Status: DC | PRN
  Administered 2023-10-04: 100 mg via INTRAVENOUS

## 2023-10-04 MED ORDER — INSULIN ASPART 100 UNIT/ML IJ SOLN: 0.0000 [IU] | INTRAMUSCULAR | Status: DC | PRN

## 2023-10-04 MED ORDER — CHLORHEXIDINE GLUCONATE 0.12 % MT SOLN
15.0000 mL | Freq: Once | OROMUCOSAL | Status: AC
Start: 2023-10-04 — End: 2023-10-04
  Administered 2023-10-04: 15 mL via OROMUCOSAL
  Filled 2023-10-04: qty 15

## 2023-10-04 MED ORDER — FENTANYL CITRATE (PF) 250 MCG/5ML IJ SOLN
INTRAMUSCULAR | Status: AC
  Filled 2023-10-04: qty 5

## 2023-10-04 MED ORDER — ONDANSETRON HCL 4 MG/2ML IJ SOLN
INTRAMUSCULAR | Status: DC | PRN
  Administered 2023-10-04: 4 mg via INTRAVENOUS

## 2023-10-04 MED ORDER — FENTANYL CITRATE (PF) 250 MCG/5ML IJ SOLN
INTRAMUSCULAR | Status: DC | PRN
  Administered 2023-10-04: 50 ug via INTRAVENOUS

## 2023-10-04 MED ORDER — ONDANSETRON HCL 4 MG/2ML IJ SOLN: 4.0000 mg | Freq: Once | INTRAMUSCULAR | Status: DC | PRN

## 2023-10-04 MED ORDER — ROCURONIUM BROMIDE 10 MG/ML (PF) SYRINGE
PREFILLED_SYRINGE | INTRAVENOUS | Status: DC | PRN
  Administered 2023-10-04: 30 mg via INTRAVENOUS
  Administered 2023-10-04: 20 mg via INTRAVENOUS

## 2023-10-04 MED ORDER — DEXMEDETOMIDINE HCL IN NACL 80 MCG/20ML IV SOLN
INTRAVENOUS | Status: DC | PRN
  Administered 2023-10-04: 8 ug via INTRAVENOUS
  Administered 2023-10-04: 12 ug via INTRAVENOUS

## 2023-10-04 MED ORDER — LACTATED RINGERS IV SOLN: INTRAVENOUS | Status: DC

## 2023-10-04 MED ORDER — TRIAMCINOLONE ACETONIDE 40 MG/ML IJ SUSP
INTRAMUSCULAR | Status: AC
  Filled 2023-10-04: qty 5

## 2023-10-04 MED ORDER — DEXAMETHASONE SODIUM PHOSPHATE 10 MG/ML IJ SOLN
INTRAMUSCULAR | Status: DC | PRN
  Administered 2023-10-04: 10 mg via INTRAVENOUS

## 2023-10-04 MED ORDER — SUGAMMADEX SODIUM 200 MG/2ML IV SOLN
INTRAVENOUS | Status: DC | PRN
  Administered 2023-10-04: 200 mg via INTRAVENOUS

## 2023-10-04 MED ORDER — ACETAMINOPHEN 500 MG PO TABS
1000.0000 mg | ORAL_TABLET | Freq: Once | ORAL | Status: AC
  Administered 2023-10-04: 1000 mg via ORAL

## 2023-10-04 MED ORDER — FENTANYL CITRATE (PF) 100 MCG/2ML IJ SOLN: 25.0000 ug | INTRAMUSCULAR | Status: DC | PRN

## 2023-10-04 MED ORDER — PROPOFOL 500 MG/50ML IV EMUL
INTRAVENOUS | Status: DC | PRN
  Administered 2023-10-04: 150 ug/kg/min via INTRAVENOUS

## 2023-10-04 MED ORDER — ONDANSETRON HCL 4 MG/2ML IJ SOLN
INTRAMUSCULAR | Status: AC
  Filled 2023-10-04: qty 2

## 2023-10-04 MED ORDER — EPINEPHRINE HCL (NASAL) 0.1 % NA SOLN
NASAL | Status: AC
  Filled 2023-10-04: qty 30

## 2023-10-04 SURGICAL SUPPLY — 27 items
BAG COUNTER SPONGE SURGICOUNT (BAG) ×1 IMPLANT
CANISTER SUCT 3000ML PPV (MISCELLANEOUS) ×1 IMPLANT
CNTNR URN SCR LID CUP LEK RST (MISCELLANEOUS) IMPLANT
COVER BACK TABLE 60X90IN (DRAPES) ×1 IMPLANT
COVER MAYO STAND STRL (DRAPES) ×1 IMPLANT
DRAPE HALF SHEET 40X57 (DRAPES) ×1 IMPLANT
GAUZE SPONGE 4X4 12PLY STRL (GAUZE/BANDAGES/DRESSINGS) ×1 IMPLANT
GLOVE BIO SURGEON STRL SZ7.5 (GLOVE) ×1 IMPLANT
GOWN STRL REUS W/ TWL LRG LVL3 (GOWN DISPOSABLE) IMPLANT
GUARD TEETH (MISCELLANEOUS) IMPLANT
KIT BASIN OR (CUSTOM PROCEDURE TRAY) ×1 IMPLANT
KIT PROLARN PLUS GEL W/NDL (Prosthesis and Implant ENT) ×1 IMPLANT
KIT TURNOVER KIT B (KITS) ×1 IMPLANT
NDL HYPO 25GX1X1/2 BEV (NEEDLE) IMPLANT
NDL TRANS ORAL INJECTION (NEEDLE) IMPLANT
NEEDLE HYPO 25GX1X1/2 BEV (NEEDLE)
NEEDLE TRANS ORAL INJECTION (NEEDLE)
NS IRRIG 1000ML POUR BTL (IV SOLUTION) ×1 IMPLANT
PAD ARMBOARD 7.5X6 YLW CONV (MISCELLANEOUS) ×2 IMPLANT
PATTIES SURGICAL .5 X1 (DISPOSABLE) IMPLANT
PATTIES SURGICAL .5 X3 (DISPOSABLE) IMPLANT
POSITIONER HEAD DONUT 9IN (MISCELLANEOUS) IMPLANT
SOL ANTI FOG 6CC (MISCELLANEOUS) ×1 IMPLANT
SURGILUBE 2OZ TUBE FLIPTOP (MISCELLANEOUS) IMPLANT
TOWEL GREEN STERILE FF (TOWEL DISPOSABLE) ×2 IMPLANT
TUBE CONNECTING 12X1/4 (SUCTIONS) ×1 IMPLANT
WATER STERILE IRR 1000ML POUR (IV SOLUTION) IMPLANT

## 2023-10-04 NOTE — H&P (Signed)
[name]. is an 68 y.o. male.   Chief Complaint: Hoarseness HPI: 68 year old male with hoarseness due to left vocal fold paresis.  Past Medical History:  Diagnosis Date   Alopecia    s/p chemotherapy   Arthritis    Bursitis    pt. denies   Cancer (HCC) 10/2007    tongue/squamous cell,stg IV,HPV positive   Diabetes mellitus without complication (HCC)    takes Amaryl,Januvia,and Metformin,daily   DVT, lower extremity (HCC) 2009   side effect from chemo and has blood clot left leg-wears compression stockings;was on Coumadin for 8months and has been off 5 yrs   History of chemotherapy    Cisplatin/Taxotere,5-FU   History of radiation therapy 01/28/08-03/11/08   left base tongue   History of shingles    Hyperlipidemia    takes Vytorin daily   Hypertension    Hypothyroidism    takes Synthroid daily   Joint pain    Joint swelling    Neuropathy    left foot   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 denies   Right kidney mass    Seborrheic keratosis    multiple on back    Past Surgical History:  Procedure Laterality Date   BIOPSY  07/07/2022   Procedure: BIOPSY;  Surgeon: Lynann Bologna, DO;  Location: [facility];  Service: Gastroenterology;;   CARDIAC CATHETERIZATION     CHEST TUBE INSERTION  07/06/2022   Procedure: CHEST TUBE INSERTION;  Surgeon: Leslye Peer, MD;  Location: [facility];  Service: Cardiopulmonary;;   ESOPHAGOGASTRODUODENOSCOPY (EGD) WITH PROPOFOL N/A 07/07/2022   Procedure: ESOPHAGOGASTRODUODENOSCOPY (EGD) WITH PROPOFOL;  Surgeon: Lynann Bologna, DO;  Location: [facility];  Service: Gastroenterology;  Laterality: N/A;   ESOPHAGOSCOPY WITH DILITATION Bilateral 02/06/2023   Procedure: RIGID ESOPHAGOSCOPY WITH DILITATION;  Surgeon: Christia Reading, MD;  Location: [facility];  Service: ENT;  Laterality: Bilateral;   feeding tube placed  2009 and then removed    KNEE SURGERY Left 1993   LYMPH NODE DISSECTION Right 08/05/2020   Procedure: LYMPH NODE DISSECTION;  Surgeon: Heloise Purpura, MD;  Location: [facility];  Service: Urology;  Laterality: Right;   MOUTH SURGERY     PILONIDAL CYST / SINUS EXCISION  1974   port a cath placement     PORT-A-CATH REMOVAL  2010   right wrist fracture Right 2004   ROBOT ASSISTED LAPAROSCOPIC NEPHRECTOMY Right 08/05/2020   Procedure: XI ROBOTIC ASSISTED LAPAROSCOPIC RADICAL NEPHRECTOMY/PARTIAL ADRENALECTOMY;  Surgeon: Heloise Purpura, MD;  Location: [facility];  Service: Urology;  Laterality: Right;   SHOULDER ARTHROSCOPY WITH DISTAL CLAVICLE RESECTION Right 10/26/2016   Procedure: SHOULDER ARTHROSCOPY WITH DISTAL CLAVICLE RESECTION;  Surgeon: Francena Hanly, MD;  Location: [facility];  Service: Orthopedics;  Laterality: Right;   SHOULDER ARTHROSCOPY WITH ROTATOR CUFF REPAIR AND SUBACROMIAL DECOMPRESSION Left 07/16/2014   Procedure: LEFT SHOULDER ARTHROSCOPY WITH ROTATOR CUFF REPAIR AND SUBACROMIAL DECOMPRESSION/DISTAL CLAVICLE RESECTION;  Surgeon: Senaida Lange, MD;  Location: [facility];  Service: Orthopedics;  Laterality: Left;   SHOULDER ARTHROSCOPY WITH ROTATOR CUFF REPAIR AND SUBACROMIAL DECOMPRESSION Right 10/26/2016   Procedure: RIGHT SHOULDER ARTHROSCOPY WITH ROTATOR CUFF REPAIR AND SUBACROMIAL DECOMPRESSION;  Surgeon: Francena Hanly, MD;  Location: [facility];  Service: Orthopedics;  Laterality: Right;  requests 2hrs   teeth extraction #9      Family History  Problem Relation Age of Onset  Cancer Mother        uterine   Diabetes Brother    Social History:  reports that he has never smoked. He has been exposed to tobacco smoke. He has never used smokeless tobacco. He reports that he does not currently use alcohol. He reports that he does not use drugs.  Allergies:  Allergies  Allergen Reactions   Empagliflozin     diabetic ketoacidosis     Medications Prior to Admission  Medication Sig Dispense Refill   Dulaglutide  (TRULICITY) 1.5 MG/0.5ML SOPN Inject 1 pen under the skin once a week 6 mL 4   ezetimibe-simvastatin (VYTORIN) 10-20 MG tablet Take 1 tablet by mouth once daily at night. 90 tablet 4   Insulin Glargine (BASAGLAR KWIKPEN) 100 UNIT/ML Inject 10-20 Units into the skin See admin instructions. Inject 20 units in the morning and 10 units at night     levothyroxine (SYNTHROID) 75 MCG tablet Take 1 tablet by mouth every morning on an empty stomach 90 tablet 3   losartan (COZAAR) 25 MG tablet Take 25 mg by mouth daily.     Multiple Vitamin (MULTIVITAMIN) tablet Take 1 tablet by mouth daily.     rivaroxaban (XARELTO) 10 MG TABS tablet TAKE 1 TABLET BY MOUTH ONCE DAILY WITH FOOD. 90 tablet 4   glucose blood test strip Use to check blood sugar twice daily 100 each 4   Insulin Pen Needle (PIP PEN NEEDLES 31G X ) 31G X 5 MM MISC Use as directed once daily 100 each 3   Lancets (FREESTYLE) lancets Use to check blood sugar twice a day. 100 each 4    Results for orders placed or performed during the hospital encounter of 10/04/23 (from the past 48 hours)  Glucose, capillary     Status: Abnormal   Collection Time: 10/04/23  5:55 AM  Result Value Ref Range   Glucose-Capillary 200 (H) 70 - 99 mg/dL    Comment: Glucose reference range applies only to samples taken after fasting for at least 8 hours.   Comment 1 Notify RN   Basic metabolic panel per protocol     Status: Abnormal   Collection Time: 10/04/23  6:37 AM  Result Value Ref Range   Sodium 137 135 - 145 mmol/L   Potassium 5.1 3.5 - 5.1 mmol/L   Chloride 107 98 - 111 mmol/L   CO2 24 22 - 32 mmol/L   Glucose, Bld 189 (H) 70 - 99 mg/dL    Comment: Glucose reference range applies only to samples taken after fasting for at least 8 hours.   BUN 38 (H) 8 - 23 mg/dL   Creatinine, Ser 1.61 (H) 0.61 - 1.24 mg/dL   Calcium 8.9 8.9 - 09.6 mg/dL   GFR, Estimated 29 (L) >60 mL/min    Comment: (NOTE) Calculated using the CKD-EPI Creatinine Equation (2021)     Anion gap 6 5 - 15    Comment: Performed at [facility], [address], [place], [place] [address]  CBC per protocol     Status: Abnormal   Collection Time: 10/04/23  6:37 AM  Result Value Ref Range   WBC 8.1 4.0 - 10.5 K/uL   RBC 4.38 4.22 - 5.81 MIL/uL   Hemoglobin 12.5 (L) 13.0 - 17.0 g/dL   HCT 98.1 19.1 - 47.8 %   MCV 90.0 80.0 - 100.0 fL   MCH 28.5 26.0 - 34.0 pg   MCHC 31.7 30.0 - 36.0 g/dL   RDW  12.8 11.5 - 15.5 %   Platelets 178 150 - 400 K/uL   nRBC 0.0 0.0 - 0.2 %    Comment: Performed at [facility], [address], [place], [place] [address]  Glucose, capillary     Status: Abnormal   Collection Time: 10/04/23  7:37 AM  Result Value Ref Range   Glucose-Capillary 158 (H) 70 - 99 mg/dL    Comment: Glucose reference range applies only to samples taken after fasting for at least 8 hours.   Comment 1 Notify RN    No results found.  Review of Systems  All other systems reviewed and are negative.   Blood pressure 135/73, pulse 85, temperature 98.4 F (36.9 C), temperature source Oral, resp. rate 18, height 6\' 3"  (1.905 m), weight 93 kg, SpO2 99%. Physical Exam Constitutional:      Appearance: Normal appearance.  HENT:     Head: Normocephalic and atraumatic.     Right Ear: External ear normal.     Left Ear: External ear normal.     Nose: Nose normal.     Mouth/Throat:     Mouth: Mucous membranes are moist.     Pharynx: Oropharynx is clear.  Eyes:     Extraocular Movements: Extraocular movements intact.     Conjunctiva/sclera: Conjunctivae normal.     Pupils: Pupils are equal, round, and reactive to light.  Cardiovascular:     Rate and Rhythm: Normal rate.  Pulmonary:     Effort: Pulmonary effort is normal.  Musculoskeletal:     Cervical back: Normal range of motion.  Skin:    General: Skin is warm and dry.  Neurological:     General: No focal deficit present.     Mental Status: He is alert and oriented to person, place, and time.   Psychiatric:        Mood and Affect: Mood normal.        Behavior: Behavior normal.        Thought Content: Thought content normal.        Judgment: Judgment normal.      Assessment/Plan Left vocal fold paresis and hoarseness  To OR for SMDL with Prolaryn injection.  Christia Reading, MD 10/04/2023, 8:26 AM

## 2023-10-04 NOTE — Transfer of Care (Signed)
Immediate Anesthesia Transfer of Care Note  Patient: [name].  Procedure(s) Performed: SUSPENSION MICRO DIRECT LARYNGOSCOPY WITH PROLARYN INJECTION, LEFT;  JET VENTILATION  Patient Location: PACU  Anesthesia Type:General  Level of Consciousness: awake, alert , and oriented  Airway & Oxygen Therapy: Patient Spontanous Breathing  Post-op Assessment: Report given to RN  Post vital signs: Reviewed and stable  Last Vitals:  Vitals Value Taken Time  BP 113/74 10/04/23 0941  Temp 36.4 C 10/04/23 0940  Pulse 84 10/04/23 0944  Resp 18 10/04/23 0944  SpO2 97 % 10/04/23 0944  Vitals shown include unfiled device data.  Last Pain:  Vitals:   10/04/23 0940  TempSrc:   PainSc: Asleep         Complications: No notable events documented.

## 2023-10-04 NOTE — Brief Op Note (Signed)
10/04/2023  9:30 AM  PATIENT:  [name].  68 y.o. male  PRE-OPERATIVE DIAGNOSIS:  Pharyngoesophageal dysphagia; Dysphonia; Paresis of left vocal cord; History of primary malignant neoplasm of base of tongue  POST-OPERATIVE DIAGNOSIS:  Pharyngoesophageal dysphagia; Dysphonia; Paresis of left vocal cord; History of primary malignant neoplasm of base of tongue  PROCEDURE:  Procedure(s): SUSPENSION MICRO DIRECT LARYNGOSCOPY WITH PROLARYN INJECTION;  JET VENTILATION (N/A)  SURGEON:  Surgeons and Role:    Christia Reading, MD - Primary  PHYSICIAN ASSISTANT:   ASSISTANTS: none   ANESTHESIA:   general  EBL:  None   BLOOD ADMINISTERED:none  DRAINS: none   LOCAL MEDICATIONS USED:  NONE  SPECIMEN:  No Specimen  DISPOSITION OF SPECIMEN:  N/A  COUNTS:  YES  TOURNIQUET:  * No tourniquets in log *  DICTATION: .Note written in EPIC  PLAN OF CARE: Discharge to home after PACU  PATIENT DISPOSITION:  PACU - hemodynamically stable.   Delay start of Pharmacological VTE agent (>24hrs) due to surgical blood loss or risk of bleeding: no

## 2023-10-04 NOTE — Op Note (Signed)
PREOPERATIVE DIAGNOSIS:  Hoarseness and left vocal cord paresis   POSTOPERATIVE DIAGNOSIS:  Hoarseness and left vocal cord paresis   PROCEDURE:  Suspended microdirect laryngoscopy with Prolaryn injection   SURGEON:  Christia Reading, MD   ANESTHESIA:  General endotracheal anesthesia.   COMPLICATIONS:  None.   INDICATIONS:  The patient is a 68 year old male with a history of hoarseness found to be related to left vocal fold paresis.  He has a history of tongue base cancer treated with radiation and chemotherapy.  He presents to the operating room for surgical management.   FINDINGS:  Larynx anatomy unremarkable.  Exposure of larynx was difficult.   DESCRIPTION OF PROCEDURE:  The patient was identified in the holding room, informed consent having been obtained including discussion of risks, benefits and alternatives, the patient was brought to the operative suite and put the operative table in the supine position.  Anesthesia was induced and the patient was initially maintained via mask ventilation.  The eyes were taped closed and bed was turned 90 degrees from anesthesia.  The patient was given intravenous steroids during the  case.  A tooth guard was placed over the upper teeth and attempts were made to expose the larynx using a couple of different Stortz laryngoscopes but his mouth and throat anatomy and stiffness prohibited exposure.  A Pilling anterior commissure scope was then tried and allowed good exposure.  That being the case, the bed was turned back to Anesthesia under mask ventilation and the patient was intubated using the Glide scope without difficulty with a small ET tube.  The bed was turned back to the 90 degree position and the tooth guard was repositioned.  The anterior commissure scope was then replaced with good exposure.  The scope was suspended to Mayo stand using the Lewy arm.  Photodocumentation was performed with the zero degree telescope.  Prolaryn was then injected in the  lateral left vocal fold under telescope visualization totaling 0.4 cc.  Photodocumentation was repeated.  The laryngoscope was then taking out of suspension and removed from the patient's mouth while suctioning the airway.  The tooth guard was removed and the patient was turned back to anesthesia for wakeup, was extubated, and taken to the recovery room in stable condition.

## 2023-10-04 NOTE — Anesthesia Procedure Notes (Signed)
Procedure Name: Intubation Date/Time: 10/04/2023 9:11 AM  Performed by: Bartholomew Crews, CRNAPre-anesthesia Checklist: Patient identified, Emergency Drugs available, Suction available, Patient being monitored and Timeout performed Patient Re-evaluated:Patient Re-evaluated prior to induction Oxygen Delivery Method: Circle system utilized Preoxygenation: Pre-oxygenation with 100% oxygen Induction Type: IV induction Ventilation: Mask ventilation without difficulty Laryngoscope Size: Glidescope and 3 Grade View: Grade I Tube type: Oral Tube size: 6.0 mm Airway Equipment and Method: Stylet Placement Confirmation: ETT inserted through vocal cords under direct vision, positive ETCO2, CO2 detector and breath sounds checked- equal and bilateral Secured at: 24 cm

## 2023-10-04 NOTE — Anesthesia Postprocedure Evaluation (Signed)
Anesthesia Post Note  Patient: [name].  Procedure(s) Performed: SUSPENSION MICRO DIRECT LARYNGOSCOPY WITH PROLARYN INJECTION, LEFT;  JET VENTILATION     Patient location during evaluation: PACU Anesthesia Type: General Level of consciousness: awake and alert Pain management: pain level controlled Vital Signs Assessment: post-procedure vital signs reviewed and stable Respiratory status: spontaneous breathing, nonlabored ventilation and respiratory function stable Cardiovascular status: blood pressure returned to baseline and stable Postop Assessment: no apparent nausea or vomiting Anesthetic complications: no   No notable events documented.  Last Vitals:  Vitals:   10/04/23 1000 10/04/23 1015  BP: (!) 109/56 (!) 115/59  Pulse: 75 72  Resp: 15 13  Temp:    SpO2: 97% 97%    Last Pain:  Vitals:   10/04/23 1015  TempSrc:   PainSc: Asleep                 Collene Schlichter

## 2023-10-05 ENCOUNTER — Encounter (HOSPITAL_COMMUNITY): Payer: Self-pay | Admitting: Otolaryngology

## 2023-10-18 DIAGNOSIS — J3801 Paralysis of vocal cords and larynx, unilateral: Secondary | ICD-10-CM | POA: Diagnosis not present

## 2023-11-28 DIAGNOSIS — R490 Dysphonia: Secondary | ICD-10-CM | POA: Diagnosis not present

## 2023-11-28 DIAGNOSIS — J3801 Paralysis of vocal cords and larynx, unilateral: Secondary | ICD-10-CM | POA: Diagnosis not present

## 2023-11-30 DIAGNOSIS — N1832 Chronic kidney disease, stage 3b: Secondary | ICD-10-CM | POA: Diagnosis not present

## 2023-11-30 DIAGNOSIS — E1122 Type 2 diabetes mellitus with diabetic chronic kidney disease: Secondary | ICD-10-CM | POA: Diagnosis not present

## 2023-11-30 DIAGNOSIS — R809 Proteinuria, unspecified: Secondary | ICD-10-CM | POA: Diagnosis not present

## 2023-11-30 DIAGNOSIS — N183 Chronic kidney disease, stage 3 unspecified: Secondary | ICD-10-CM | POA: Diagnosis not present

## 2023-11-30 DIAGNOSIS — N2581 Secondary hyperparathyroidism of renal origin: Secondary | ICD-10-CM | POA: Diagnosis not present

## 2023-11-30 DIAGNOSIS — Z85528 Personal history of other malignant neoplasm of kidney: Secondary | ICD-10-CM | POA: Diagnosis not present

## 2023-11-30 DIAGNOSIS — N189 Chronic kidney disease, unspecified: Secondary | ICD-10-CM | POA: Diagnosis not present

## 2023-11-30 DIAGNOSIS — D631 Anemia in chronic kidney disease: Secondary | ICD-10-CM | POA: Diagnosis not present

## 2024-01-03 DIAGNOSIS — N1832 Chronic kidney disease, stage 3b: Secondary | ICD-10-CM | POA: Diagnosis not present

## 2024-01-29 DIAGNOSIS — N1832 Chronic kidney disease, stage 3b: Secondary | ICD-10-CM | POA: Diagnosis not present

## 2024-02-14 DIAGNOSIS — H40013 Open angle with borderline findings, low risk, bilateral: Secondary | ICD-10-CM | POA: Diagnosis not present

## 2024-02-14 DIAGNOSIS — Z0100 Encounter for examination of eyes and vision without abnormal findings: Secondary | ICD-10-CM | POA: Diagnosis not present

## 2024-02-14 DIAGNOSIS — H2513 Age-related nuclear cataract, bilateral: Secondary | ICD-10-CM | POA: Diagnosis not present

## 2024-02-14 DIAGNOSIS — D3132 Benign neoplasm of left choroid: Secondary | ICD-10-CM | POA: Diagnosis not present

## 2024-02-14 DIAGNOSIS — E119 Type 2 diabetes mellitus without complications: Secondary | ICD-10-CM | POA: Diagnosis not present

## 2024-02-19 DIAGNOSIS — E7800 Pure hypercholesterolemia, unspecified: Secondary | ICD-10-CM | POA: Diagnosis not present

## 2024-02-19 DIAGNOSIS — I10 Essential (primary) hypertension: Secondary | ICD-10-CM | POA: Diagnosis not present

## 2024-02-19 DIAGNOSIS — Z923 Personal history of irradiation: Secondary | ICD-10-CM | POA: Diagnosis not present

## 2024-02-19 DIAGNOSIS — E1169 Type 2 diabetes mellitus with other specified complication: Secondary | ICD-10-CM | POA: Diagnosis not present

## 2024-02-19 DIAGNOSIS — Z0000 Encounter for general adult medical examination without abnormal findings: Secondary | ICD-10-CM | POA: Diagnosis not present

## 2024-02-19 DIAGNOSIS — Z6827 Body mass index (BMI) 27.0-27.9, adult: Secondary | ICD-10-CM | POA: Diagnosis not present

## 2024-03-24 DIAGNOSIS — N1832 Chronic kidney disease, stage 3b: Secondary | ICD-10-CM | POA: Diagnosis not present

## 2024-03-27 DIAGNOSIS — I6522 Occlusion and stenosis of left carotid artery: Secondary | ICD-10-CM | POA: Diagnosis not present

## 2024-04-03 DIAGNOSIS — I129 Hypertensive chronic kidney disease with stage 1 through stage 4 chronic kidney disease, or unspecified chronic kidney disease: Secondary | ICD-10-CM | POA: Diagnosis not present

## 2024-04-03 DIAGNOSIS — Z85528 Personal history of other malignant neoplasm of kidney: Secondary | ICD-10-CM | POA: Diagnosis not present

## 2024-04-03 DIAGNOSIS — N2581 Secondary hyperparathyroidism of renal origin: Secondary | ICD-10-CM | POA: Diagnosis not present

## 2024-04-03 DIAGNOSIS — D631 Anemia in chronic kidney disease: Secondary | ICD-10-CM | POA: Diagnosis not present

## 2024-04-03 DIAGNOSIS — E875 Hyperkalemia: Secondary | ICD-10-CM | POA: Diagnosis not present

## 2024-04-03 DIAGNOSIS — E1122 Type 2 diabetes mellitus with diabetic chronic kidney disease: Secondary | ICD-10-CM | POA: Diagnosis not present

## 2024-04-03 DIAGNOSIS — N1832 Chronic kidney disease, stage 3b: Secondary | ICD-10-CM | POA: Diagnosis not present

## 2024-04-18 DIAGNOSIS — D485 Neoplasm of uncertain behavior of skin: Secondary | ICD-10-CM | POA: Diagnosis not present

## 2024-04-18 DIAGNOSIS — D225 Melanocytic nevi of trunk: Secondary | ICD-10-CM | POA: Diagnosis not present

## 2024-04-18 DIAGNOSIS — C44329 Squamous cell carcinoma of skin of other parts of face: Secondary | ICD-10-CM | POA: Diagnosis not present

## 2024-04-18 DIAGNOSIS — C44319 Basal cell carcinoma of skin of other parts of face: Secondary | ICD-10-CM | POA: Diagnosis not present

## 2024-05-21 DIAGNOSIS — C44329 Squamous cell carcinoma of skin of other parts of face: Secondary | ICD-10-CM | POA: Diagnosis not present

## 2024-05-22 DIAGNOSIS — L821 Other seborrheic keratosis: Secondary | ICD-10-CM | POA: Diagnosis not present

## 2024-05-22 DIAGNOSIS — L570 Actinic keratosis: Secondary | ICD-10-CM | POA: Diagnosis not present

## 2024-05-22 DIAGNOSIS — C44329 Squamous cell carcinoma of skin of other parts of face: Secondary | ICD-10-CM | POA: Diagnosis not present

## 2024-05-22 DIAGNOSIS — D485 Neoplasm of uncertain behavior of skin: Secondary | ICD-10-CM | POA: Diagnosis not present

## 2024-05-22 DIAGNOSIS — D1801 Hemangioma of skin and subcutaneous tissue: Secondary | ICD-10-CM | POA: Diagnosis not present

## 2024-05-22 DIAGNOSIS — L578 Other skin changes due to chronic exposure to nonionizing radiation: Secondary | ICD-10-CM | POA: Diagnosis not present

## 2024-06-02 DIAGNOSIS — C44319 Basal cell carcinoma of skin of other parts of face: Secondary | ICD-10-CM | POA: Diagnosis not present

## 2024-06-17 DIAGNOSIS — C44329 Squamous cell carcinoma of skin of other parts of face: Secondary | ICD-10-CM | POA: Diagnosis not present

## 2024-07-21 DIAGNOSIS — N1832 Chronic kidney disease, stage 3b: Secondary | ICD-10-CM | POA: Diagnosis not present

## 2024-07-30 DIAGNOSIS — D631 Anemia in chronic kidney disease: Secondary | ICD-10-CM | POA: Diagnosis not present

## 2024-07-30 DIAGNOSIS — Z85528 Personal history of other malignant neoplasm of kidney: Secondary | ICD-10-CM | POA: Diagnosis not present

## 2024-07-30 DIAGNOSIS — E1122 Type 2 diabetes mellitus with diabetic chronic kidney disease: Secondary | ICD-10-CM | POA: Diagnosis not present

## 2024-07-30 DIAGNOSIS — E875 Hyperkalemia: Secondary | ICD-10-CM | POA: Diagnosis not present

## 2024-07-30 DIAGNOSIS — N1832 Chronic kidney disease, stage 3b: Secondary | ICD-10-CM | POA: Diagnosis not present

## 2024-07-30 DIAGNOSIS — I129 Hypertensive chronic kidney disease with stage 1 through stage 4 chronic kidney disease, or unspecified chronic kidney disease: Secondary | ICD-10-CM | POA: Diagnosis not present

## 2024-07-30 DIAGNOSIS — N2581 Secondary hyperparathyroidism of renal origin: Secondary | ICD-10-CM | POA: Diagnosis not present

## 2024-08-13 ENCOUNTER — Other Ambulatory Visit: Payer: Self-pay | Admitting: Urology

## 2024-08-13 ENCOUNTER — Encounter: Payer: Self-pay | Admitting: Urology

## 2024-08-13 DIAGNOSIS — C649 Malignant neoplasm of unspecified kidney, except renal pelvis: Secondary | ICD-10-CM

## 2024-08-15 DIAGNOSIS — C649 Malignant neoplasm of unspecified kidney, except renal pelvis: Secondary | ICD-10-CM | POA: Diagnosis not present

## 2024-08-20 DIAGNOSIS — C649 Malignant neoplasm of unspecified kidney, except renal pelvis: Secondary | ICD-10-CM | POA: Diagnosis not present

## 2024-09-09 ENCOUNTER — Ambulatory Visit
Admission: RE | Admit: 2024-09-09 | Discharge: 2024-09-09 | Disposition: A | Discharge status: Home / Self Care | Source: Ambulatory Visit | Attending: Urology | Admitting: Urology

## 2024-09-09 DIAGNOSIS — C649 Malignant neoplasm of unspecified kidney, except renal pelvis: Secondary | ICD-10-CM

## 2024-09-09 MED ORDER — GADOPICLENOL 0.5 MMOL/ML IV SOLN
9.0000 mL | Freq: Once | INTRAVENOUS | Status: AC | PRN
  Administered 2024-09-09: 9 mL via INTRAVENOUS
